# Patient Record
Sex: Male | Born: 1950 | Race: White | Hispanic: No | Marital: Married | Smoking: Former smoker
Health system: Southern US, Community
[De-identification: ages and names within clinical notes are randomized; demographics above are authoritative.]

## PROBLEM LIST (undated history)

## (undated) DIAGNOSIS — Z8619 Personal history of other infectious and parasitic diseases: Secondary | ICD-10-CM

## (undated) DIAGNOSIS — G4700 Insomnia, unspecified: Secondary | ICD-10-CM

## (undated) DIAGNOSIS — D3131 Benign neoplasm of right choroid: Secondary | ICD-10-CM

## (undated) DIAGNOSIS — N400 Enlarged prostate without lower urinary tract symptoms: Secondary | ICD-10-CM

## (undated) DIAGNOSIS — K648 Other hemorrhoids: Secondary | ICD-10-CM

## (undated) DIAGNOSIS — Z86010 Personal history of colonic polyps: Secondary | ICD-10-CM

## (undated) DIAGNOSIS — D239 Other benign neoplasm of skin, unspecified: Secondary | ICD-10-CM

## (undated) DIAGNOSIS — K219 Gastro-esophageal reflux disease without esophagitis: Secondary | ICD-10-CM

## (undated) DIAGNOSIS — R413 Other amnesia: Secondary | ICD-10-CM

## (undated) DIAGNOSIS — R351 Nocturia: Secondary | ICD-10-CM

## (undated) DIAGNOSIS — Z9089 Acquired absence of other organs: Secondary | ICD-10-CM

## (undated) DIAGNOSIS — G8929 Other chronic pain: Secondary | ICD-10-CM

## (undated) DIAGNOSIS — Z8639 Personal history of other endocrine, nutritional and metabolic disease: Secondary | ICD-10-CM

## (undated) DIAGNOSIS — G4730 Sleep apnea, unspecified: Secondary | ICD-10-CM

## (undated) DIAGNOSIS — E890 Postprocedural hypothyroidism: Secondary | ICD-10-CM

## (undated) DIAGNOSIS — G8191 Hemiplegia, unspecified affecting right dominant side: Secondary | ICD-10-CM

## (undated) DIAGNOSIS — I10 Essential (primary) hypertension: Secondary | ICD-10-CM

## (undated) DIAGNOSIS — D332 Benign neoplasm of brain, unspecified: Secondary | ICD-10-CM

## (undated) DIAGNOSIS — K5730 Diverticulosis of large intestine without perforation or abscess without bleeding: Secondary | ICD-10-CM

## (undated) DIAGNOSIS — R519 Headache, unspecified: Secondary | ICD-10-CM

## (undated) DIAGNOSIS — T7840XA Allergy, unspecified, initial encounter: Secondary | ICD-10-CM

## (undated) DIAGNOSIS — J309 Allergic rhinitis, unspecified: Secondary | ICD-10-CM

## (undated) DIAGNOSIS — Z9989 Dependence on other enabling machines and devices: Secondary | ICD-10-CM

## (undated) DIAGNOSIS — K5900 Constipation, unspecified: Secondary | ICD-10-CM

## (undated) DIAGNOSIS — R202 Paresthesia of skin: Secondary | ICD-10-CM

## (undated) DIAGNOSIS — Z8719 Personal history of other diseases of the digestive system: Secondary | ICD-10-CM

## (undated) DIAGNOSIS — Q141 Congenital malformation of retina: Secondary | ICD-10-CM

## (undated) DIAGNOSIS — G4733 Obstructive sleep apnea (adult) (pediatric): Secondary | ICD-10-CM

## (undated) DIAGNOSIS — H269 Unspecified cataract: Secondary | ICD-10-CM

## (undated) DIAGNOSIS — R5383 Other fatigue: Secondary | ICD-10-CM

## (undated) DIAGNOSIS — E041 Nontoxic single thyroid nodule: Secondary | ICD-10-CM

## (undated) DIAGNOSIS — R29898 Other symptoms and signs involving the musculoskeletal system: Secondary | ICD-10-CM

## (undated) DIAGNOSIS — E039 Hypothyroidism, unspecified: Secondary | ICD-10-CM

## (undated) DIAGNOSIS — Z860101 Personal history of adenomatous and serrated colon polyps: Secondary | ICD-10-CM

## (undated) DIAGNOSIS — R001 Bradycardia, unspecified: Secondary | ICD-10-CM

## (undated) DIAGNOSIS — R339 Retention of urine, unspecified: Secondary | ICD-10-CM

## (undated) DIAGNOSIS — R079 Chest pain, unspecified: Secondary | ICD-10-CM

## (undated) HISTORY — DX: Gastro-esophageal reflux disease without esophagitis: K21.9

## (undated) HISTORY — DX: Headache, unspecified: R51.9

## (undated) HISTORY — DX: Benign neoplasm of right choroid: D31.31

## (undated) HISTORY — DX: Unspecified atrial fibrillation: I48.91

## (undated) HISTORY — DX: Chest pain, unspecified: R07.9

## (undated) HISTORY — DX: Congenital malformation of retina: Q14.1

## (undated) HISTORY — DX: Unspecified cataract: H26.9

## (undated) HISTORY — PX: POLYPECTOMY: SHX149

## (undated) HISTORY — DX: Insomnia, unspecified: G47.00

## (undated) HISTORY — DX: Hemiplegia, unspecified affecting right dominant side: G81.91

## (undated) HISTORY — DX: Other benign neoplasm of skin, unspecified: D23.9

## (undated) HISTORY — DX: Acquired absence of other organs: Z90.89

## (undated) HISTORY — DX: Bradycardia, unspecified: R00.1

## (undated) HISTORY — PX: WISDOM TOOTH EXTRACTION: SHX21

## (undated) HISTORY — DX: Hypothyroidism, unspecified: E03.9

## (undated) HISTORY — DX: Paresthesia of skin: R20.2

## (undated) HISTORY — DX: Allergy, unspecified, initial encounter: T78.40XA

## (undated) HISTORY — DX: Other amnesia: R41.3

## (undated) HISTORY — PX: HEMORRHOID BANDING: SHX5850

## (undated) HISTORY — DX: Other fatigue: R53.83

## (undated) HISTORY — DX: Other chronic pain: G89.29

## (undated) HISTORY — DX: Sleep apnea, unspecified: G47.30

## (undated) HISTORY — DX: Nocturia: R35.1

## (undated) HISTORY — DX: Allergic rhinitis, unspecified: J30.9

## (undated) HISTORY — PX: KNEE ARTHROSCOPY: SUR90

## (undated) HISTORY — PX: BRAIN SURGERY: SHX531

## (undated) HISTORY — DX: Postprocedural hypothyroidism: E89.0

## (undated) HISTORY — DX: Constipation, unspecified: K59.00

## (undated) HISTORY — PX: LASIK: SHX215

## (undated) HISTORY — PX: EYE SURGERY: SHX253

## (undated) HISTORY — DX: Other hemorrhoids: K64.8

## (undated) HISTORY — PX: HERNIA REPAIR: SHX51

## (undated) HISTORY — PX: COLONOSCOPY: SHX174

## (undated) HISTORY — PX: PILONIDAL CYST EXCISION: SHX744

## (undated) HISTORY — DX: Benign neoplasm of brain, unspecified: D33.2

## (undated) HISTORY — DX: Other symptoms and signs involving the musculoskeletal system: R29.898

## (undated) SURGICAL SUPPLY — 2 items
WATCHMAN FLX PRO PROCEDURE (KITS) ×1 IMPLANT
WATCHMAN FXD CRV SYS PROCEDURE (KITS) ×1 IMPLANT

---

## 2008-10-30 HISTORY — PX: UPPER GASTROINTESTINAL ENDOSCOPY: SHX188

## 2011-06-13 HISTORY — PX: THYROIDECTOMY: SHX17

## 2014-08-31 DIAGNOSIS — G4730 Sleep apnea, unspecified: Secondary | ICD-10-CM | POA: Diagnosis not present

## 2014-08-31 DIAGNOSIS — K219 Gastro-esophageal reflux disease without esophagitis: Secondary | ICD-10-CM | POA: Diagnosis not present

## 2014-08-31 DIAGNOSIS — N400 Enlarged prostate without lower urinary tract symptoms: Secondary | ICD-10-CM | POA: Diagnosis not present

## 2014-08-31 DIAGNOSIS — H359 Unspecified retinal disorder: Secondary | ICD-10-CM | POA: Diagnosis not present

## 2014-08-31 DIAGNOSIS — R351 Nocturia: Secondary | ICD-10-CM | POA: Diagnosis not present

## 2014-08-31 DIAGNOSIS — J309 Allergic rhinitis, unspecified: Secondary | ICD-10-CM | POA: Diagnosis not present

## 2014-10-12 DIAGNOSIS — G4700 Insomnia, unspecified: Secondary | ICD-10-CM | POA: Diagnosis not present

## 2014-10-12 DIAGNOSIS — G4710 Hypersomnia, unspecified: Secondary | ICD-10-CM | POA: Diagnosis not present

## 2014-10-22 DIAGNOSIS — H04123 Dry eye syndrome of bilateral lacrimal glands: Secondary | ICD-10-CM | POA: Diagnosis not present

## 2014-10-22 DIAGNOSIS — H2513 Age-related nuclear cataract, bilateral: Secondary | ICD-10-CM | POA: Diagnosis not present

## 2014-10-22 DIAGNOSIS — H524 Presbyopia: Secondary | ICD-10-CM | POA: Diagnosis not present

## 2014-11-03 DIAGNOSIS — R972 Elevated prostate specific antigen [PSA]: Secondary | ICD-10-CM | POA: Diagnosis not present

## 2014-11-03 DIAGNOSIS — R350 Frequency of micturition: Secondary | ICD-10-CM | POA: Diagnosis not present

## 2014-11-03 DIAGNOSIS — R351 Nocturia: Secondary | ICD-10-CM | POA: Diagnosis not present

## 2014-11-03 DIAGNOSIS — N401 Enlarged prostate with lower urinary tract symptoms: Secondary | ICD-10-CM | POA: Diagnosis not present

## 2014-11-10 DIAGNOSIS — G4700 Insomnia, unspecified: Secondary | ICD-10-CM | POA: Diagnosis not present

## 2014-11-10 DIAGNOSIS — G4733 Obstructive sleep apnea (adult) (pediatric): Secondary | ICD-10-CM | POA: Diagnosis not present

## 2014-11-10 DIAGNOSIS — G4730 Sleep apnea, unspecified: Secondary | ICD-10-CM | POA: Diagnosis not present

## 2014-11-17 DIAGNOSIS — R509 Fever, unspecified: Secondary | ICD-10-CM | POA: Diagnosis not present

## 2014-11-17 DIAGNOSIS — J329 Chronic sinusitis, unspecified: Secondary | ICD-10-CM | POA: Diagnosis not present

## 2014-11-26 DIAGNOSIS — Z9889 Other specified postprocedural states: Secondary | ICD-10-CM | POA: Diagnosis not present

## 2014-11-26 DIAGNOSIS — G4733 Obstructive sleep apnea (adult) (pediatric): Secondary | ICD-10-CM | POA: Diagnosis not present

## 2014-11-26 DIAGNOSIS — J324 Chronic pansinusitis: Secondary | ICD-10-CM | POA: Diagnosis not present

## 2014-11-30 ENCOUNTER — Other Ambulatory Visit: Payer: Self-pay | Admitting: Otolaryngology

## 2014-11-30 DIAGNOSIS — D34 Benign neoplasm of thyroid gland: Secondary | ICD-10-CM

## 2014-12-17 ENCOUNTER — Ambulatory Visit
Admission: RE | Admit: 2014-12-17 | Discharge: 2014-12-17 | Disposition: A | Discharge status: Home / Self Care | Payer: Medicare Other | Source: Ambulatory Visit | Attending: Otolaryngology | Admitting: Otolaryngology

## 2014-12-17 DIAGNOSIS — E042 Nontoxic multinodular goiter: Secondary | ICD-10-CM | POA: Diagnosis not present

## 2014-12-17 DIAGNOSIS — E890 Postprocedural hypothyroidism: Secondary | ICD-10-CM | POA: Diagnosis not present

## 2014-12-17 DIAGNOSIS — Z23 Encounter for immunization: Secondary | ICD-10-CM | POA: Diagnosis not present

## 2014-12-17 DIAGNOSIS — D34 Benign neoplasm of thyroid gland: Secondary | ICD-10-CM

## 2014-12-17 IMAGING — US US SOFT TISSUE HEAD/NECK
1 series · 13 of 25 positions shown · non-contrast
Comparison: None.

CLINICAL DATA: Follow-up exam.  Left thyroidectomy.

EXAM:
THYROID ULTRASOUND
TECHNIQUE: Ultrasound examination of the thyroid gland and adjacent soft
tissues was performed.

[Series 1: us soft tissue head/neck · 0.06mm/px · 13 of 48 slices shown]
[im 1/48]
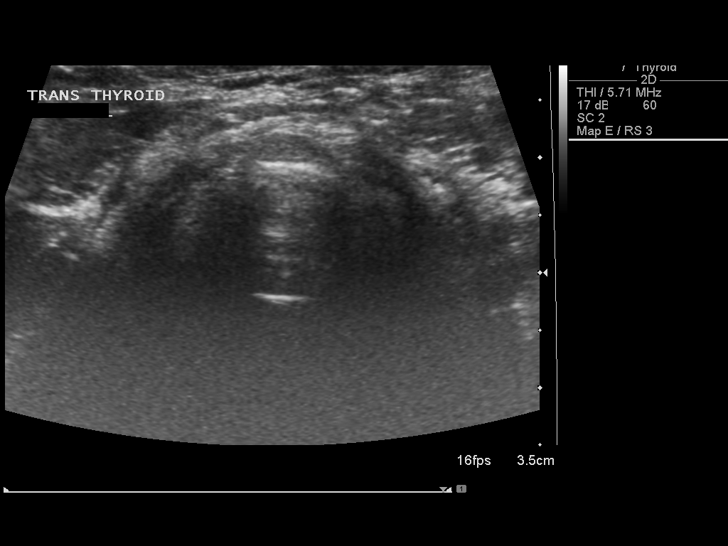
[im 4/48]
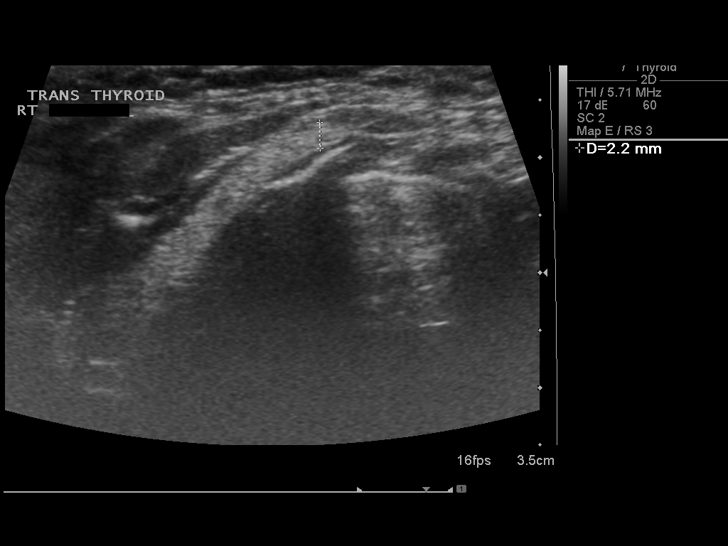
[im 8/48]
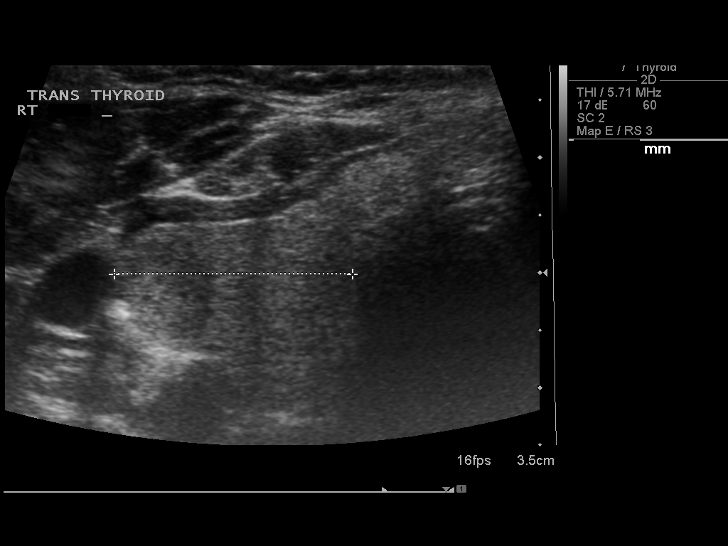
[im 12/48]
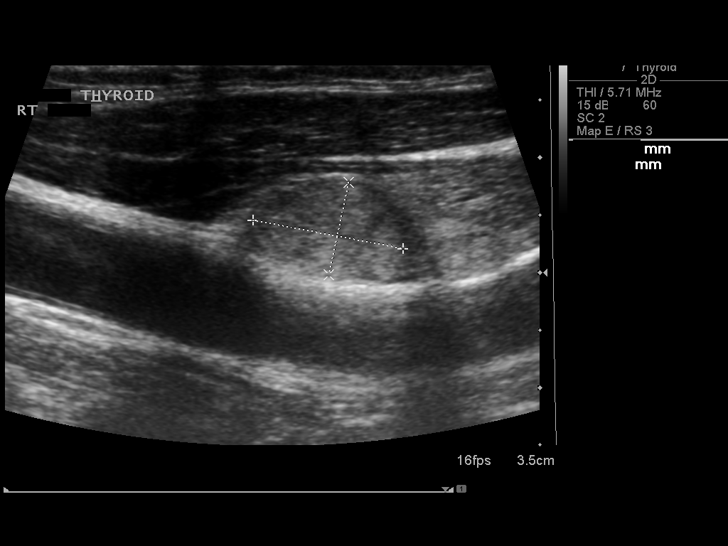
[im 16/48]
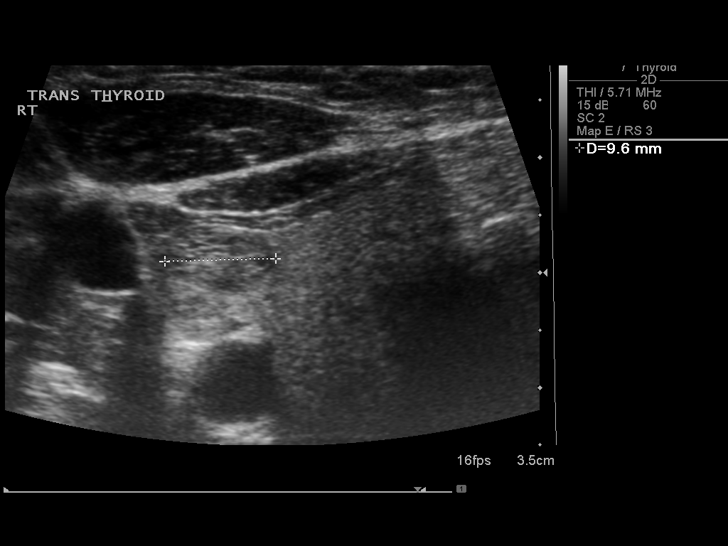
[im 20/48]
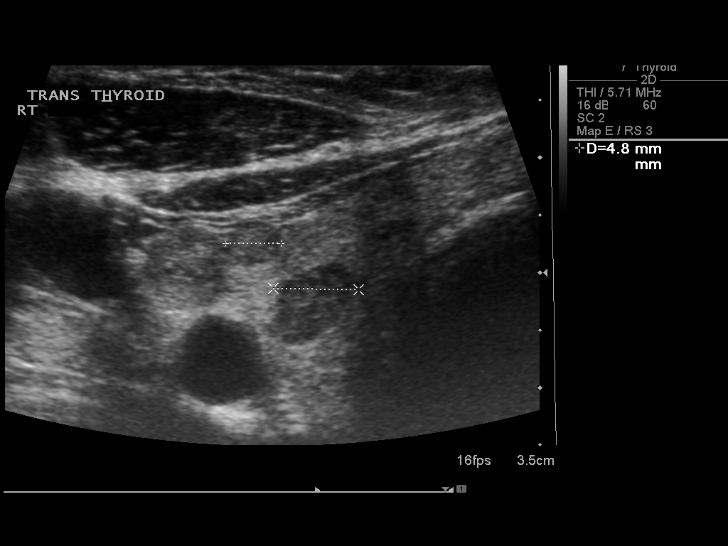
[im 24/48]
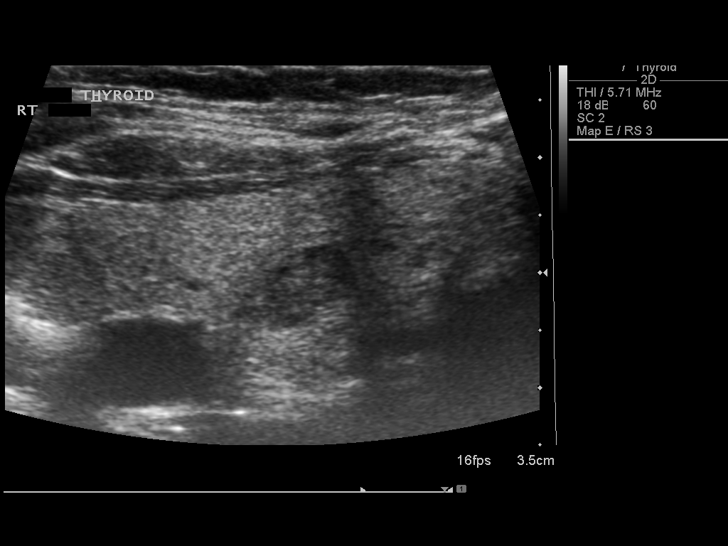
[im 28/48]
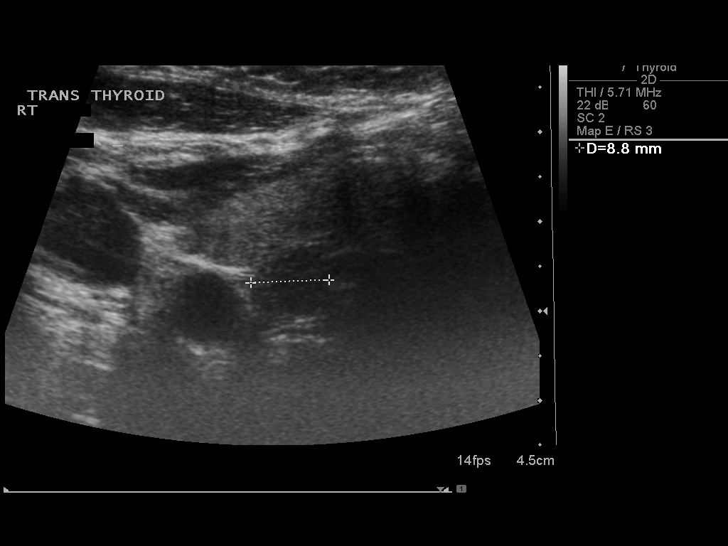
[im 32/48]
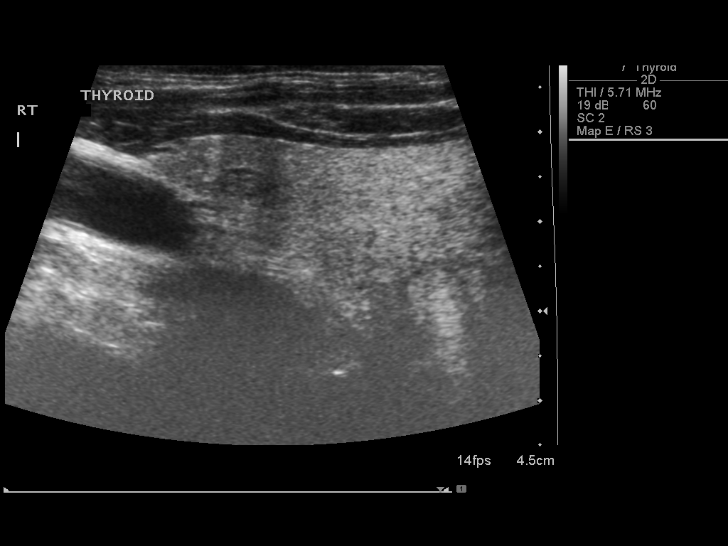
[im 36/48]
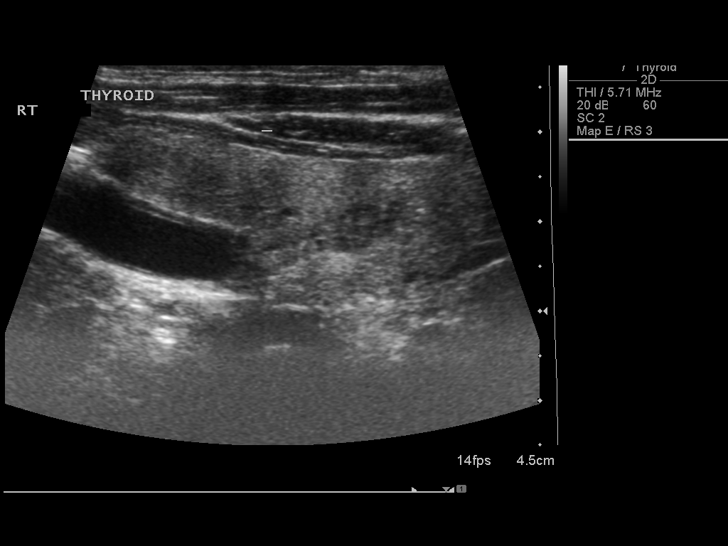
[im 40/48]
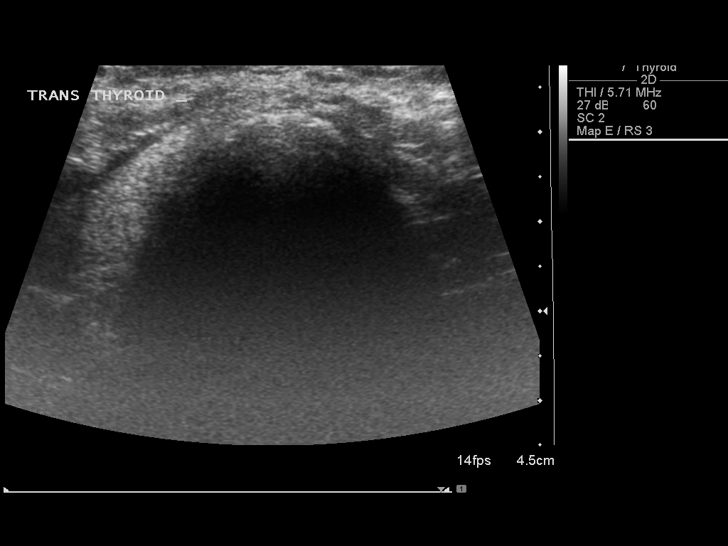
[im 44/48]
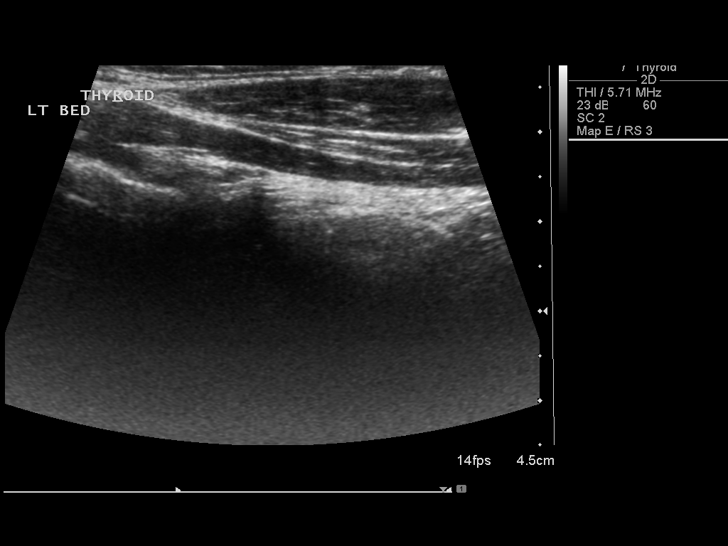
[im 48/48]
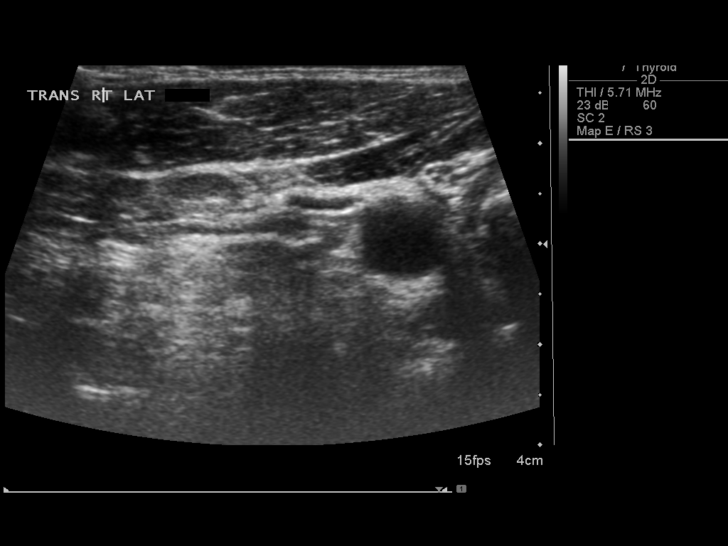

[13 of 25 positions shown; findings below may reference images not displayed]

FINDINGS: Right thyroid lobe

Measurements: 4.8 x 1.8 x 2.1 cm. 1.3 cm solid nodule upper portion
right gland.

1 cm solid nodule upper mid gland. Less than 5 mm solid nodule mid
gland. 1.1 cm solid nodule lower right gland. 1 cm adjacent solid
nodule lower right gland.

Left thyroid lobe

Left thyroidectomy.

Isthmus

Thickness: 0.2 cm.  No nodules visualized.

Lymphadenopathy

None visualized.
IMPRESSION: 1. Multiple solid thyroid nodules right gland, the largest measures
1.3 cm and is in the upper gland. Correlate with any prior thyroid
scans. There has been any significant growth biopsy can be
performed. If there has been no prior scans findings do not meet
current SRU consensus criteria for biopsy. Follow-up by clinical
exam is recommended. If patient has known risk factors for thyroid
carcinoma, consider follow-up ultrasound in 12 months. If patient is
clinically hyperthyroid, consider nuclear medicine thyroid uptake
and scan.Reference: Management of Thyroid Nodules Detected at US:
Society of Radiologists in Ultrasound Consensus Conference

2.  Left thyroidectomy.

## 2014-12-31 DIAGNOSIS — J3089 Other allergic rhinitis: Secondary | ICD-10-CM | POA: Diagnosis not present

## 2014-12-31 DIAGNOSIS — H1045 Other chronic allergic conjunctivitis: Secondary | ICD-10-CM | POA: Diagnosis not present

## 2014-12-31 DIAGNOSIS — J301 Allergic rhinitis due to pollen: Secondary | ICD-10-CM | POA: Diagnosis not present

## 2015-01-01 DIAGNOSIS — J301 Allergic rhinitis due to pollen: Secondary | ICD-10-CM | POA: Diagnosis not present

## 2015-01-01 DIAGNOSIS — J3089 Other allergic rhinitis: Secondary | ICD-10-CM | POA: Diagnosis not present

## 2015-01-11 DIAGNOSIS — J3089 Other allergic rhinitis: Secondary | ICD-10-CM | POA: Diagnosis not present

## 2015-01-11 DIAGNOSIS — J301 Allergic rhinitis due to pollen: Secondary | ICD-10-CM | POA: Diagnosis not present

## 2015-01-15 DIAGNOSIS — J3089 Other allergic rhinitis: Secondary | ICD-10-CM | POA: Diagnosis not present

## 2015-01-15 DIAGNOSIS — J301 Allergic rhinitis due to pollen: Secondary | ICD-10-CM | POA: Diagnosis not present

## 2015-01-18 DIAGNOSIS — G4700 Insomnia, unspecified: Secondary | ICD-10-CM | POA: Diagnosis not present

## 2015-01-18 DIAGNOSIS — J301 Allergic rhinitis due to pollen: Secondary | ICD-10-CM | POA: Diagnosis not present

## 2015-01-18 DIAGNOSIS — G4733 Obstructive sleep apnea (adult) (pediatric): Secondary | ICD-10-CM | POA: Diagnosis not present

## 2015-01-21 DIAGNOSIS — J301 Allergic rhinitis due to pollen: Secondary | ICD-10-CM | POA: Diagnosis not present

## 2015-01-25 DIAGNOSIS — J3089 Other allergic rhinitis: Secondary | ICD-10-CM | POA: Diagnosis not present

## 2015-01-25 DIAGNOSIS — J301 Allergic rhinitis due to pollen: Secondary | ICD-10-CM | POA: Diagnosis not present

## 2015-01-27 DIAGNOSIS — J301 Allergic rhinitis due to pollen: Secondary | ICD-10-CM | POA: Diagnosis not present

## 2015-01-27 DIAGNOSIS — J3089 Other allergic rhinitis: Secondary | ICD-10-CM | POA: Diagnosis not present

## 2015-01-28 DIAGNOSIS — R972 Elevated prostate specific antigen [PSA]: Secondary | ICD-10-CM | POA: Diagnosis not present

## 2015-01-29 DIAGNOSIS — J3089 Other allergic rhinitis: Secondary | ICD-10-CM | POA: Diagnosis not present

## 2015-01-29 DIAGNOSIS — J301 Allergic rhinitis due to pollen: Secondary | ICD-10-CM | POA: Diagnosis not present

## 2015-02-01 DIAGNOSIS — J301 Allergic rhinitis due to pollen: Secondary | ICD-10-CM | POA: Diagnosis not present

## 2015-02-01 DIAGNOSIS — J3089 Other allergic rhinitis: Secondary | ICD-10-CM | POA: Diagnosis not present

## 2015-02-03 DIAGNOSIS — J301 Allergic rhinitis due to pollen: Secondary | ICD-10-CM | POA: Diagnosis not present

## 2015-02-03 DIAGNOSIS — J3089 Other allergic rhinitis: Secondary | ICD-10-CM | POA: Diagnosis not present

## 2015-02-05 DIAGNOSIS — J301 Allergic rhinitis due to pollen: Secondary | ICD-10-CM | POA: Diagnosis not present

## 2015-02-05 DIAGNOSIS — J3089 Other allergic rhinitis: Secondary | ICD-10-CM | POA: Diagnosis not present

## 2015-02-08 DIAGNOSIS — R972 Elevated prostate specific antigen [PSA]: Secondary | ICD-10-CM | POA: Diagnosis not present

## 2015-02-08 DIAGNOSIS — N401 Enlarged prostate with lower urinary tract symptoms: Secondary | ICD-10-CM | POA: Diagnosis not present

## 2015-02-08 DIAGNOSIS — R351 Nocturia: Secondary | ICD-10-CM | POA: Diagnosis not present

## 2015-02-08 DIAGNOSIS — J3089 Other allergic rhinitis: Secondary | ICD-10-CM | POA: Diagnosis not present

## 2015-02-08 DIAGNOSIS — J301 Allergic rhinitis due to pollen: Secondary | ICD-10-CM | POA: Diagnosis not present

## 2015-02-12 DIAGNOSIS — J3089 Other allergic rhinitis: Secondary | ICD-10-CM | POA: Diagnosis not present

## 2015-02-12 DIAGNOSIS — J301 Allergic rhinitis due to pollen: Secondary | ICD-10-CM | POA: Diagnosis not present

## 2015-02-16 DIAGNOSIS — J301 Allergic rhinitis due to pollen: Secondary | ICD-10-CM | POA: Diagnosis not present

## 2015-02-16 DIAGNOSIS — J3089 Other allergic rhinitis: Secondary | ICD-10-CM | POA: Diagnosis not present

## 2015-02-18 DIAGNOSIS — G4700 Insomnia, unspecified: Secondary | ICD-10-CM | POA: Diagnosis not present

## 2015-02-19 DIAGNOSIS — J3089 Other allergic rhinitis: Secondary | ICD-10-CM | POA: Diagnosis not present

## 2015-02-19 DIAGNOSIS — J301 Allergic rhinitis due to pollen: Secondary | ICD-10-CM | POA: Diagnosis not present

## 2015-02-23 DIAGNOSIS — J301 Allergic rhinitis due to pollen: Secondary | ICD-10-CM | POA: Diagnosis not present

## 2015-02-23 DIAGNOSIS — J3089 Other allergic rhinitis: Secondary | ICD-10-CM | POA: Diagnosis not present

## 2015-02-25 DIAGNOSIS — J3089 Other allergic rhinitis: Secondary | ICD-10-CM | POA: Diagnosis not present

## 2015-02-25 DIAGNOSIS — J301 Allergic rhinitis due to pollen: Secondary | ICD-10-CM | POA: Diagnosis not present

## 2015-03-01 DIAGNOSIS — J3089 Other allergic rhinitis: Secondary | ICD-10-CM | POA: Diagnosis not present

## 2015-03-01 DIAGNOSIS — J301 Allergic rhinitis due to pollen: Secondary | ICD-10-CM | POA: Diagnosis not present

## 2015-03-03 DIAGNOSIS — J301 Allergic rhinitis due to pollen: Secondary | ICD-10-CM | POA: Diagnosis not present

## 2015-03-03 DIAGNOSIS — J3089 Other allergic rhinitis: Secondary | ICD-10-CM | POA: Diagnosis not present

## 2015-03-05 DIAGNOSIS — J3089 Other allergic rhinitis: Secondary | ICD-10-CM | POA: Diagnosis not present

## 2015-03-05 DIAGNOSIS — J301 Allergic rhinitis due to pollen: Secondary | ICD-10-CM | POA: Diagnosis not present

## 2015-03-08 DIAGNOSIS — J3089 Other allergic rhinitis: Secondary | ICD-10-CM | POA: Diagnosis not present

## 2015-03-08 DIAGNOSIS — J301 Allergic rhinitis due to pollen: Secondary | ICD-10-CM | POA: Diagnosis not present

## 2015-03-10 DIAGNOSIS — J3089 Other allergic rhinitis: Secondary | ICD-10-CM | POA: Diagnosis not present

## 2015-03-10 DIAGNOSIS — J301 Allergic rhinitis due to pollen: Secondary | ICD-10-CM | POA: Diagnosis not present

## 2015-03-11 DIAGNOSIS — Z23 Encounter for immunization: Secondary | ICD-10-CM | POA: Diagnosis not present

## 2015-03-16 DIAGNOSIS — J301 Allergic rhinitis due to pollen: Secondary | ICD-10-CM | POA: Diagnosis not present

## 2015-03-16 DIAGNOSIS — J3089 Other allergic rhinitis: Secondary | ICD-10-CM | POA: Diagnosis not present

## 2015-03-19 DIAGNOSIS — J3089 Other allergic rhinitis: Secondary | ICD-10-CM | POA: Diagnosis not present

## 2015-03-19 DIAGNOSIS — J301 Allergic rhinitis due to pollen: Secondary | ICD-10-CM | POA: Diagnosis not present

## 2015-03-24 DIAGNOSIS — J301 Allergic rhinitis due to pollen: Secondary | ICD-10-CM | POA: Diagnosis not present

## 2015-03-24 DIAGNOSIS — J3089 Other allergic rhinitis: Secondary | ICD-10-CM | POA: Diagnosis not present

## 2015-03-26 DIAGNOSIS — J301 Allergic rhinitis due to pollen: Secondary | ICD-10-CM | POA: Diagnosis not present

## 2015-03-26 DIAGNOSIS — J3089 Other allergic rhinitis: Secondary | ICD-10-CM | POA: Diagnosis not present

## 2015-04-05 DIAGNOSIS — J3089 Other allergic rhinitis: Secondary | ICD-10-CM | POA: Diagnosis not present

## 2015-04-05 DIAGNOSIS — J301 Allergic rhinitis due to pollen: Secondary | ICD-10-CM | POA: Diagnosis not present

## 2015-04-12 DIAGNOSIS — J3089 Other allergic rhinitis: Secondary | ICD-10-CM | POA: Diagnosis not present

## 2015-04-12 DIAGNOSIS — J301 Allergic rhinitis due to pollen: Secondary | ICD-10-CM | POA: Diagnosis not present

## 2015-04-13 DIAGNOSIS — G4700 Insomnia, unspecified: Secondary | ICD-10-CM | POA: Diagnosis not present

## 2015-04-13 DIAGNOSIS — G4733 Obstructive sleep apnea (adult) (pediatric): Secondary | ICD-10-CM | POA: Diagnosis not present

## 2015-04-15 DIAGNOSIS — J301 Allergic rhinitis due to pollen: Secondary | ICD-10-CM | POA: Diagnosis not present

## 2015-04-15 DIAGNOSIS — J3089 Other allergic rhinitis: Secondary | ICD-10-CM | POA: Diagnosis not present

## 2015-04-19 DIAGNOSIS — J301 Allergic rhinitis due to pollen: Secondary | ICD-10-CM | POA: Diagnosis not present

## 2015-04-19 DIAGNOSIS — J3089 Other allergic rhinitis: Secondary | ICD-10-CM | POA: Diagnosis not present

## 2015-04-22 DIAGNOSIS — J301 Allergic rhinitis due to pollen: Secondary | ICD-10-CM | POA: Diagnosis not present

## 2015-04-22 DIAGNOSIS — J3089 Other allergic rhinitis: Secondary | ICD-10-CM | POA: Diagnosis not present

## 2015-04-27 DIAGNOSIS — J069 Acute upper respiratory infection, unspecified: Secondary | ICD-10-CM | POA: Diagnosis not present

## 2015-04-27 DIAGNOSIS — R05 Cough: Secondary | ICD-10-CM | POA: Diagnosis not present

## 2015-05-03 DIAGNOSIS — D2271 Melanocytic nevi of right lower limb, including hip: Secondary | ICD-10-CM | POA: Diagnosis not present

## 2015-05-03 DIAGNOSIS — D2272 Melanocytic nevi of left lower limb, including hip: Secondary | ICD-10-CM | POA: Diagnosis not present

## 2015-05-03 DIAGNOSIS — D2261 Melanocytic nevi of right upper limb, including shoulder: Secondary | ICD-10-CM | POA: Diagnosis not present

## 2015-05-03 DIAGNOSIS — D1801 Hemangioma of skin and subcutaneous tissue: Secondary | ICD-10-CM | POA: Diagnosis not present

## 2015-05-03 DIAGNOSIS — L821 Other seborrheic keratosis: Secondary | ICD-10-CM | POA: Diagnosis not present

## 2015-05-03 DIAGNOSIS — D225 Melanocytic nevi of trunk: Secondary | ICD-10-CM | POA: Diagnosis not present

## 2015-05-03 DIAGNOSIS — D2262 Melanocytic nevi of left upper limb, including shoulder: Secondary | ICD-10-CM | POA: Diagnosis not present

## 2015-05-11 DIAGNOSIS — R972 Elevated prostate specific antigen [PSA]: Secondary | ICD-10-CM | POA: Diagnosis not present

## 2015-05-11 DIAGNOSIS — N401 Enlarged prostate with lower urinary tract symptoms: Secondary | ICD-10-CM | POA: Diagnosis not present

## 2015-05-11 DIAGNOSIS — R350 Frequency of micturition: Secondary | ICD-10-CM | POA: Diagnosis not present

## 2015-05-11 DIAGNOSIS — N5201 Erectile dysfunction due to arterial insufficiency: Secondary | ICD-10-CM | POA: Diagnosis not present

## 2015-05-18 DIAGNOSIS — J301 Allergic rhinitis due to pollen: Secondary | ICD-10-CM | POA: Diagnosis not present

## 2015-05-18 DIAGNOSIS — J3089 Other allergic rhinitis: Secondary | ICD-10-CM | POA: Diagnosis not present

## 2015-05-21 DIAGNOSIS — J301 Allergic rhinitis due to pollen: Secondary | ICD-10-CM | POA: Diagnosis not present

## 2015-05-21 DIAGNOSIS — J3089 Other allergic rhinitis: Secondary | ICD-10-CM | POA: Diagnosis not present

## 2015-05-25 DIAGNOSIS — J3089 Other allergic rhinitis: Secondary | ICD-10-CM | POA: Diagnosis not present

## 2015-05-25 DIAGNOSIS — J301 Allergic rhinitis due to pollen: Secondary | ICD-10-CM | POA: Diagnosis not present

## 2015-05-28 DIAGNOSIS — J301 Allergic rhinitis due to pollen: Secondary | ICD-10-CM | POA: Diagnosis not present

## 2015-05-31 DIAGNOSIS — J301 Allergic rhinitis due to pollen: Secondary | ICD-10-CM | POA: Diagnosis not present

## 2015-05-31 DIAGNOSIS — J3089 Other allergic rhinitis: Secondary | ICD-10-CM | POA: Diagnosis not present

## 2015-06-03 DIAGNOSIS — J3089 Other allergic rhinitis: Secondary | ICD-10-CM | POA: Diagnosis not present

## 2015-06-03 DIAGNOSIS — J301 Allergic rhinitis due to pollen: Secondary | ICD-10-CM | POA: Diagnosis not present

## 2015-06-08 DIAGNOSIS — J301 Allergic rhinitis due to pollen: Secondary | ICD-10-CM | POA: Diagnosis not present

## 2015-06-08 DIAGNOSIS — J3089 Other allergic rhinitis: Secondary | ICD-10-CM | POA: Diagnosis not present

## 2015-06-11 DIAGNOSIS — J301 Allergic rhinitis due to pollen: Secondary | ICD-10-CM | POA: Diagnosis not present

## 2015-06-11 DIAGNOSIS — J3089 Other allergic rhinitis: Secondary | ICD-10-CM | POA: Diagnosis not present

## 2015-06-15 DIAGNOSIS — J3089 Other allergic rhinitis: Secondary | ICD-10-CM | POA: Diagnosis not present

## 2015-06-15 DIAGNOSIS — J301 Allergic rhinitis due to pollen: Secondary | ICD-10-CM | POA: Diagnosis not present

## 2015-06-17 DIAGNOSIS — G4700 Insomnia, unspecified: Secondary | ICD-10-CM | POA: Diagnosis not present

## 2015-06-17 DIAGNOSIS — G4733 Obstructive sleep apnea (adult) (pediatric): Secondary | ICD-10-CM | POA: Diagnosis not present

## 2015-06-18 DIAGNOSIS — J301 Allergic rhinitis due to pollen: Secondary | ICD-10-CM | POA: Diagnosis not present

## 2015-06-18 DIAGNOSIS — J3089 Other allergic rhinitis: Secondary | ICD-10-CM | POA: Diagnosis not present

## 2015-06-22 DIAGNOSIS — J3089 Other allergic rhinitis: Secondary | ICD-10-CM | POA: Diagnosis not present

## 2015-06-22 DIAGNOSIS — J301 Allergic rhinitis due to pollen: Secondary | ICD-10-CM | POA: Diagnosis not present

## 2015-06-30 DIAGNOSIS — J3089 Other allergic rhinitis: Secondary | ICD-10-CM | POA: Diagnosis not present

## 2015-06-30 DIAGNOSIS — J301 Allergic rhinitis due to pollen: Secondary | ICD-10-CM | POA: Diagnosis not present

## 2015-07-07 DIAGNOSIS — J3089 Other allergic rhinitis: Secondary | ICD-10-CM | POA: Diagnosis not present

## 2015-07-07 DIAGNOSIS — J301 Allergic rhinitis due to pollen: Secondary | ICD-10-CM | POA: Diagnosis not present

## 2015-07-13 DIAGNOSIS — J301 Allergic rhinitis due to pollen: Secondary | ICD-10-CM | POA: Diagnosis not present

## 2015-07-13 DIAGNOSIS — J3089 Other allergic rhinitis: Secondary | ICD-10-CM | POA: Diagnosis not present

## 2015-07-20 DIAGNOSIS — J3089 Other allergic rhinitis: Secondary | ICD-10-CM | POA: Diagnosis not present

## 2015-07-20 DIAGNOSIS — J301 Allergic rhinitis due to pollen: Secondary | ICD-10-CM | POA: Diagnosis not present

## 2015-07-26 DIAGNOSIS — J3089 Other allergic rhinitis: Secondary | ICD-10-CM | POA: Diagnosis not present

## 2015-07-26 DIAGNOSIS — J301 Allergic rhinitis due to pollen: Secondary | ICD-10-CM | POA: Diagnosis not present

## 2015-07-27 IMAGING — US US THYROID
1 series · 12 of 25 positions shown · non-contrast
Comparison: [DATE]

CLINICAL DATA: Follow-up right thyroid lobe nodules. Prior left
hemithyroidectomy.

EXAM:
THYROID ULTRASOUND
TECHNIQUE: Ultrasound examination of the thyroid gland and adjacent soft
tissues was performed.

[Series 1: us thyroid · 0.08mm/px · 12 of 47 slices shown]
[im 2/47]
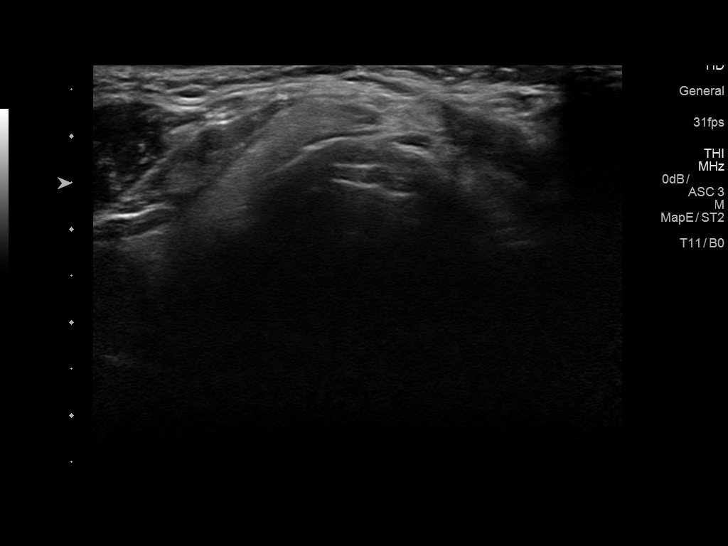
[im 6/47]
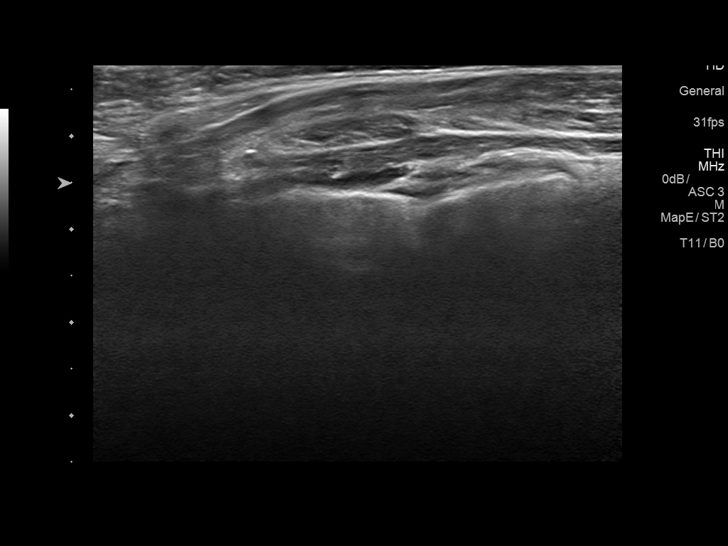
[im 10/47]
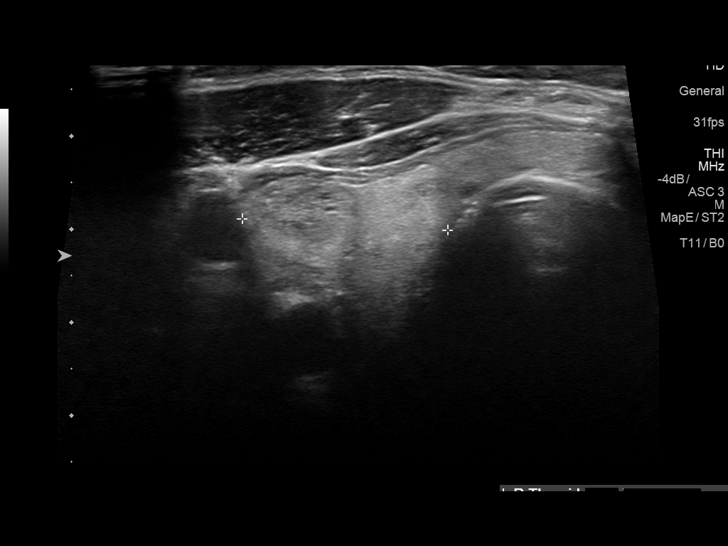
[im 14/47]
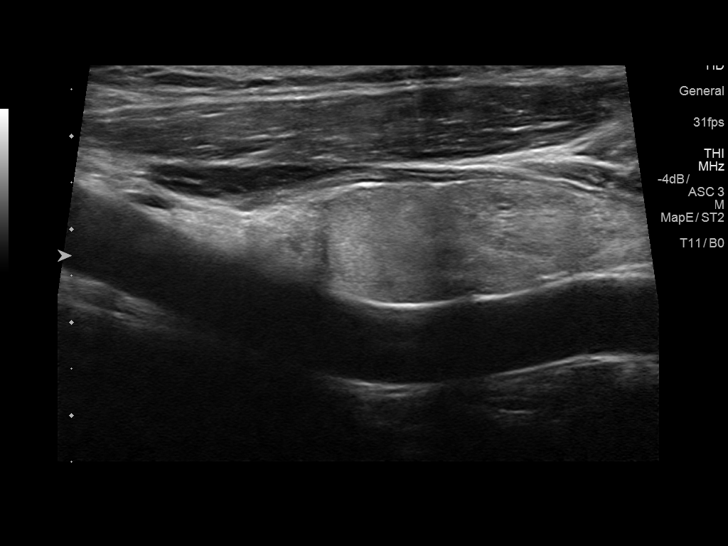
[im 18/47]
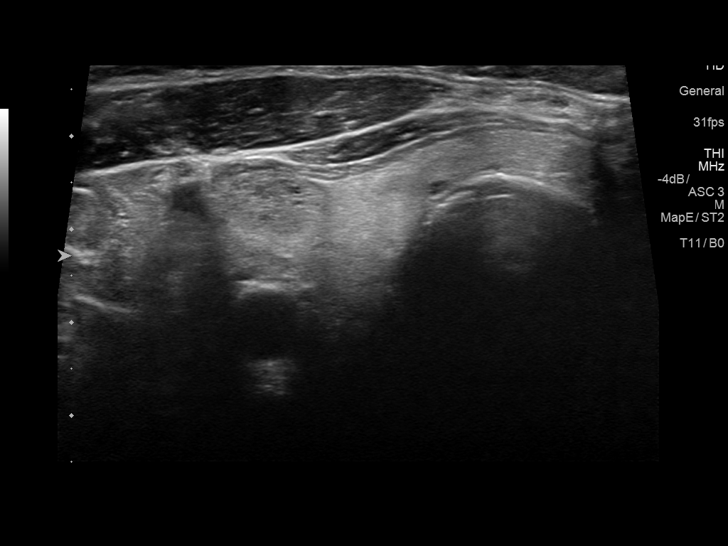
[im 22/47]
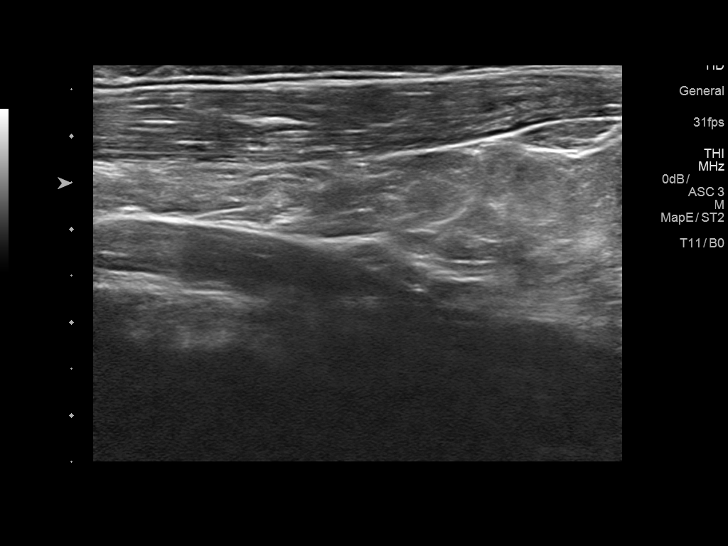
[im 25/47]
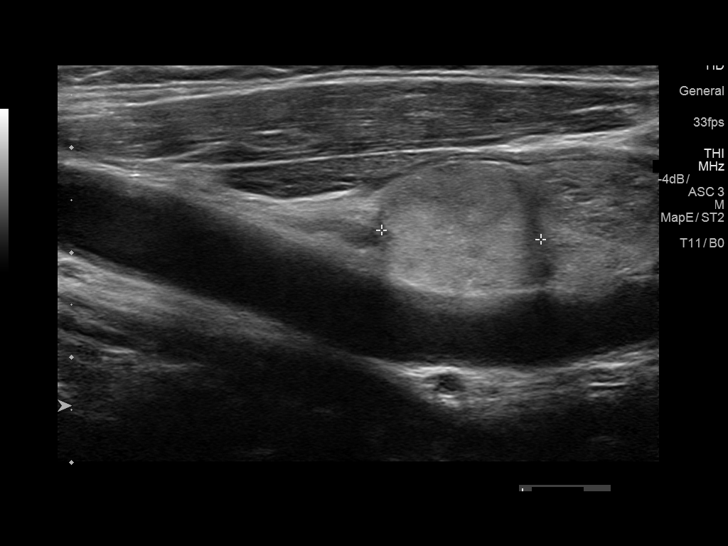
[im 29/47]
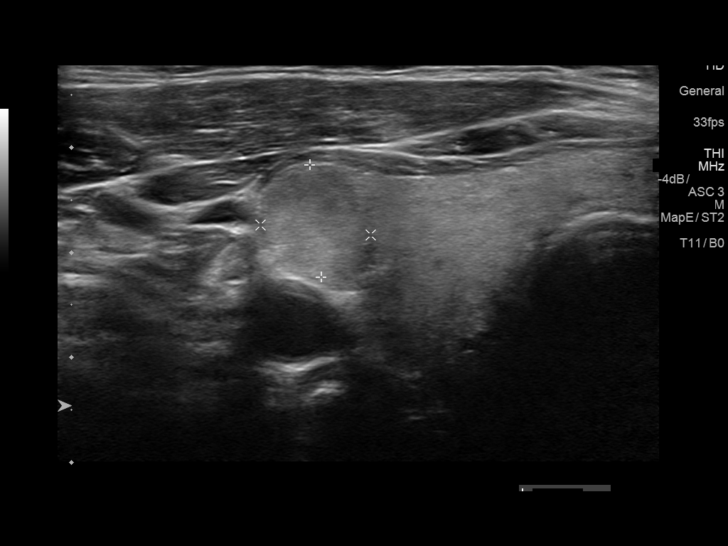
[im 33/47]
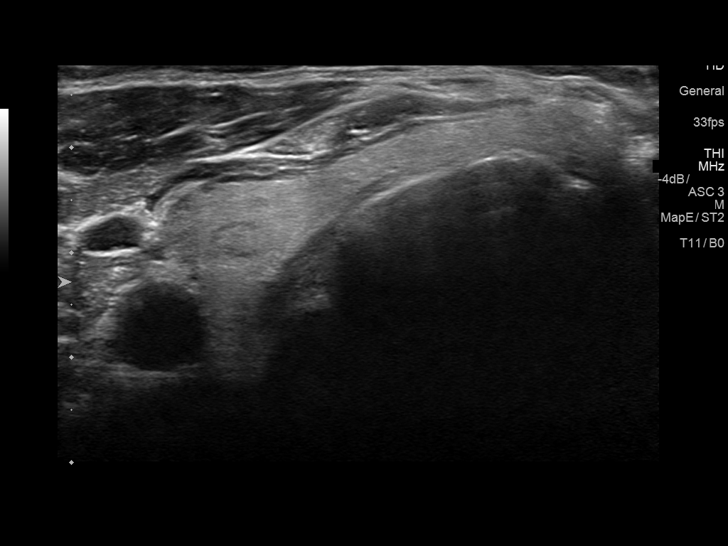
[im 37/47]
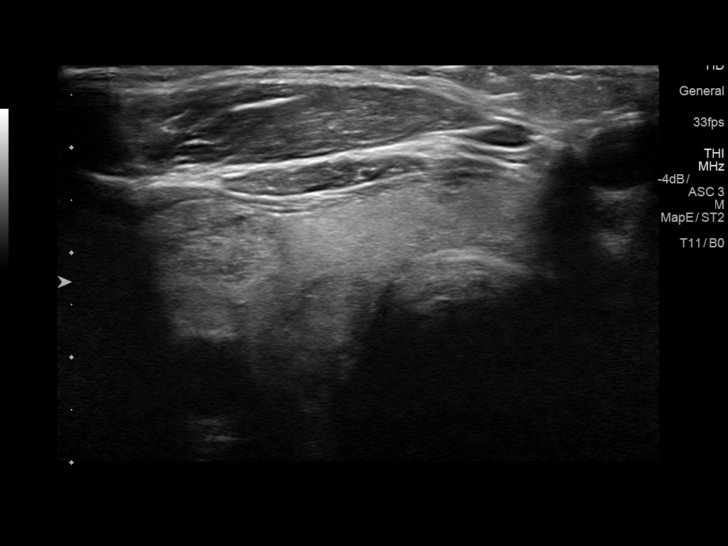
[im 41/47]
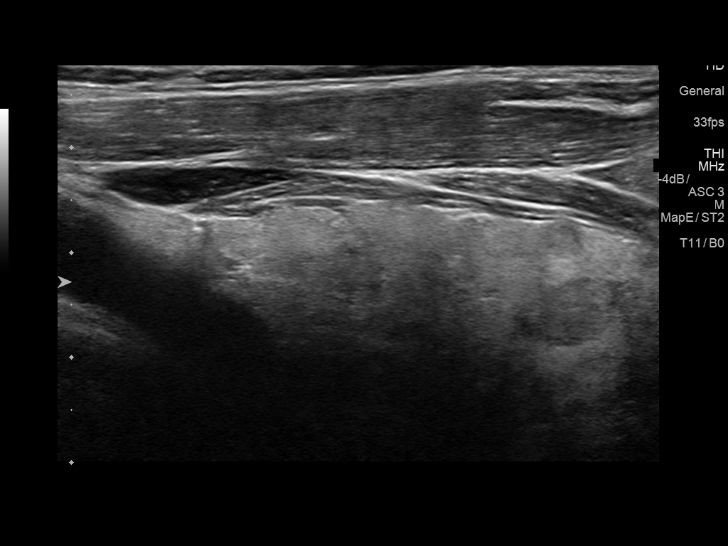
[im 45/47]
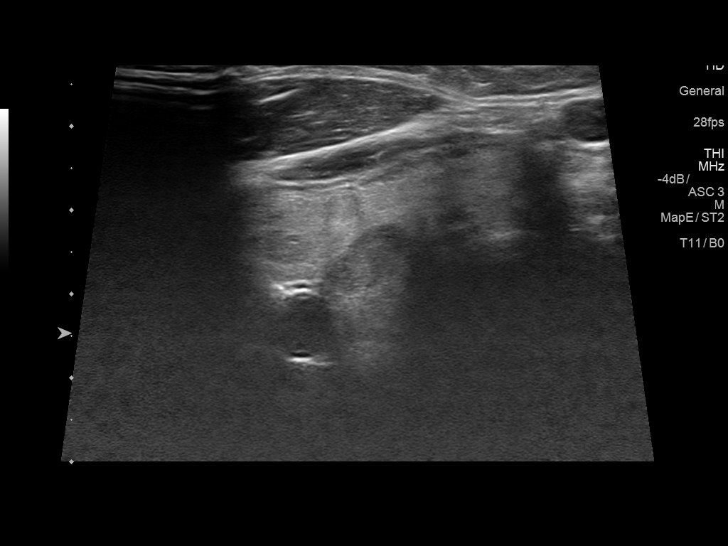

[12 of 25 positions shown; findings below may reference images not displayed]

FINDINGS: Parenchymal Echotexture: Mildly heterogenous

Isthmus: 0.4 cm, previously 0.3 cm

Right lobe: 5.6 x 1.5 x 2.2 cm, previously 5.8 x 2.0 x 2.9 cm

_________________________________________________________

Estimated total number of nodules >/= 1 cm: 2

Number of spongiform nodules >/=  2 cm not described below (TR1): 0

Number of mixed cystic and solid nodules >/= 1.5 cm not described
below (TR2): 0

_________________________________________________________

Nodule # 1:

Prior biopsy: No

Location: Right; Superior

Maximum size: 1.5 cm; Other 2 dimensions: 1.1 x 1.1 cm, previously,
1.6 x 1.1 x 1.1 cm

Composition: solid/almost completely solid (2)

Echogenicity: isoechoic (1)

Shape: not taller-than-wide (0)

Margins: ill-defined (0)

Echogenic foci: none (0)

ACR TI-RADS total points: 3.

ACR TI-RADS risk category:  TR3 (3 points).

Significant change in size (>/= 20% in two dimensions and minimal
increase of 2 mm): No

Change in features: No

Change in ACR TI-RADS risk category: No

ACR TI-RADS recommendations:

*Given size (>/= 1.5 - 2.4 cm) and appearance, a follow-up
ultrasound in 1 year should be considered based on TI-RADS criteria.

_________________________________________________________

Nodule # 2:

Prior biopsy: No

Location: Right; Mid

Maximum size: 1.3 cm; Other 2 dimensions: 1.2 x 0.7 cm, previously,
1.3 x 0.7 x 1.2 cm

Composition: solid/almost completely solid (2)

Echogenicity: hypoechoic (2)

Shape: not taller-than-wide (0)

Margins: ill-defined (0)

Echogenic foci: none (0)

ACR TI-RADS total points: 4.

ACR TI-RADS risk category:  TR4 (4-6 points).

Significant change in size (>/= 20% in two dimensions and minimal
increase of 2 mm): No

Change in features: No

Change in ACR TI-RADS risk category: No

ACR TI-RADS recommendations:

*Given size (>/= 1 - 1.4 cm) and appearance, a follow-up ultrasound
in 1 year should be considered based on TI-RADS criteria.

_________________________________________________________

Nodule # 3:

Prior biopsy: No

Location: Right; Inferior

Maximum size: 0.9 cm; Other 2 dimensions: 0.7 x 0.8 cm, previously,
1.1 x 0.7 x 0.9 cm

Composition: solid/almost completely solid (2)

Echogenicity: hypoechoic (2)

Shape: not taller-than-wide (0)

Margins: smooth (0)

Echogenic foci: none (0)

ACR TI-RADS total points: 4.

ACR TI-RADS risk category:  TR4 (4-6 points).

Significant change in size (>/= 20% in two dimensions and minimal
increase of 2 mm): No

Change in features: No

Change in ACR TI-RADS risk category: No

ACR TI-RADS recommendations:

Given size (<0.9 cm) and appearance, this nodule does NOT meet
TI-RADS criteria for biopsy or dedicated follow-up.

_________________________________________________________

Left thyroid tissue has been removed. No residual thyroid tissue in
the left thyroid surgical bed.
IMPRESSION: Stable right thyroid nodules. Two dominant nodules meet criteria for
1 year follow-up.

The above is in keeping with the ACR TI-RADS recommendations - [HOSPITAL] [01];[DATE].

## 2015-07-29 DIAGNOSIS — J3089 Other allergic rhinitis: Secondary | ICD-10-CM | POA: Diagnosis not present

## 2015-07-29 DIAGNOSIS — J301 Allergic rhinitis due to pollen: Secondary | ICD-10-CM | POA: Diagnosis not present

## 2015-08-03 DIAGNOSIS — J3089 Other allergic rhinitis: Secondary | ICD-10-CM | POA: Diagnosis not present

## 2015-08-03 DIAGNOSIS — J301 Allergic rhinitis due to pollen: Secondary | ICD-10-CM | POA: Diagnosis not present

## 2015-08-06 DIAGNOSIS — J301 Allergic rhinitis due to pollen: Secondary | ICD-10-CM | POA: Diagnosis not present

## 2015-08-06 DIAGNOSIS — J3089 Other allergic rhinitis: Secondary | ICD-10-CM | POA: Diagnosis not present

## 2015-08-09 DIAGNOSIS — Z125 Encounter for screening for malignant neoplasm of prostate: Secondary | ICD-10-CM | POA: Diagnosis not present

## 2015-08-09 DIAGNOSIS — J301 Allergic rhinitis due to pollen: Secondary | ICD-10-CM | POA: Diagnosis not present

## 2015-08-09 DIAGNOSIS — J3089 Other allergic rhinitis: Secondary | ICD-10-CM | POA: Diagnosis not present

## 2015-08-09 DIAGNOSIS — Z0000 Encounter for general adult medical examination without abnormal findings: Secondary | ICD-10-CM | POA: Diagnosis not present

## 2015-08-09 DIAGNOSIS — N5201 Erectile dysfunction due to arterial insufficiency: Secondary | ICD-10-CM | POA: Diagnosis not present

## 2015-08-09 DIAGNOSIS — Z1159 Encounter for screening for other viral diseases: Secondary | ICD-10-CM | POA: Diagnosis not present

## 2015-08-09 DIAGNOSIS — R351 Nocturia: Secondary | ICD-10-CM | POA: Diagnosis not present

## 2015-08-09 DIAGNOSIS — Z136 Encounter for screening for cardiovascular disorders: Secondary | ICD-10-CM | POA: Diagnosis not present

## 2015-08-09 DIAGNOSIS — N401 Enlarged prostate with lower urinary tract symptoms: Secondary | ICD-10-CM | POA: Diagnosis not present

## 2015-08-09 DIAGNOSIS — N3281 Overactive bladder: Secondary | ICD-10-CM | POA: Diagnosis not present

## 2015-08-12 DIAGNOSIS — Z0000 Encounter for general adult medical examination without abnormal findings: Secondary | ICD-10-CM | POA: Diagnosis not present

## 2015-08-13 DIAGNOSIS — J3089 Other allergic rhinitis: Secondary | ICD-10-CM | POA: Diagnosis not present

## 2015-08-13 DIAGNOSIS — J301 Allergic rhinitis due to pollen: Secondary | ICD-10-CM | POA: Diagnosis not present

## 2015-08-17 DIAGNOSIS — J3089 Other allergic rhinitis: Secondary | ICD-10-CM | POA: Diagnosis not present

## 2015-08-17 DIAGNOSIS — J301 Allergic rhinitis due to pollen: Secondary | ICD-10-CM | POA: Diagnosis not present

## 2015-08-23 DIAGNOSIS — J3089 Other allergic rhinitis: Secondary | ICD-10-CM | POA: Diagnosis not present

## 2015-08-23 DIAGNOSIS — J301 Allergic rhinitis due to pollen: Secondary | ICD-10-CM | POA: Diagnosis not present

## 2015-08-30 DIAGNOSIS — J3089 Other allergic rhinitis: Secondary | ICD-10-CM | POA: Diagnosis not present

## 2015-08-30 DIAGNOSIS — J301 Allergic rhinitis due to pollen: Secondary | ICD-10-CM | POA: Diagnosis not present

## 2015-09-06 DIAGNOSIS — J301 Allergic rhinitis due to pollen: Secondary | ICD-10-CM | POA: Diagnosis not present

## 2015-09-06 DIAGNOSIS — J3089 Other allergic rhinitis: Secondary | ICD-10-CM | POA: Diagnosis not present

## 2015-09-09 DIAGNOSIS — H1045 Other chronic allergic conjunctivitis: Secondary | ICD-10-CM | POA: Diagnosis not present

## 2015-09-09 DIAGNOSIS — J3089 Other allergic rhinitis: Secondary | ICD-10-CM | POA: Diagnosis not present

## 2015-09-09 DIAGNOSIS — J301 Allergic rhinitis due to pollen: Secondary | ICD-10-CM | POA: Diagnosis not present

## 2015-09-14 DIAGNOSIS — J3089 Other allergic rhinitis: Secondary | ICD-10-CM | POA: Diagnosis not present

## 2015-09-14 DIAGNOSIS — J301 Allergic rhinitis due to pollen: Secondary | ICD-10-CM | POA: Diagnosis not present

## 2015-09-20 DIAGNOSIS — J301 Allergic rhinitis due to pollen: Secondary | ICD-10-CM | POA: Diagnosis not present

## 2015-09-20 DIAGNOSIS — J3089 Other allergic rhinitis: Secondary | ICD-10-CM | POA: Diagnosis not present

## 2015-09-27 DIAGNOSIS — J3089 Other allergic rhinitis: Secondary | ICD-10-CM | POA: Diagnosis not present

## 2015-09-27 DIAGNOSIS — J301 Allergic rhinitis due to pollen: Secondary | ICD-10-CM | POA: Diagnosis not present

## 2015-10-04 DIAGNOSIS — J3089 Other allergic rhinitis: Secondary | ICD-10-CM | POA: Diagnosis not present

## 2015-10-04 DIAGNOSIS — J301 Allergic rhinitis due to pollen: Secondary | ICD-10-CM | POA: Diagnosis not present

## 2015-10-11 DIAGNOSIS — J3089 Other allergic rhinitis: Secondary | ICD-10-CM | POA: Diagnosis not present

## 2015-10-11 DIAGNOSIS — J301 Allergic rhinitis due to pollen: Secondary | ICD-10-CM | POA: Diagnosis not present

## 2015-10-13 DIAGNOSIS — N5201 Erectile dysfunction due to arterial insufficiency: Secondary | ICD-10-CM | POA: Diagnosis not present

## 2015-10-13 DIAGNOSIS — R972 Elevated prostate specific antigen [PSA]: Secondary | ICD-10-CM | POA: Diagnosis not present

## 2015-10-13 DIAGNOSIS — N401 Enlarged prostate with lower urinary tract symptoms: Secondary | ICD-10-CM | POA: Diagnosis not present

## 2015-10-13 DIAGNOSIS — N3281 Overactive bladder: Secondary | ICD-10-CM | POA: Diagnosis not present

## 2015-10-13 DIAGNOSIS — R351 Nocturia: Secondary | ICD-10-CM | POA: Diagnosis not present

## 2015-10-20 DIAGNOSIS — J3089 Other allergic rhinitis: Secondary | ICD-10-CM | POA: Diagnosis not present

## 2015-10-20 DIAGNOSIS — J301 Allergic rhinitis due to pollen: Secondary | ICD-10-CM | POA: Diagnosis not present

## 2015-10-25 DIAGNOSIS — J3089 Other allergic rhinitis: Secondary | ICD-10-CM | POA: Diagnosis not present

## 2015-10-25 DIAGNOSIS — J301 Allergic rhinitis due to pollen: Secondary | ICD-10-CM | POA: Diagnosis not present

## 2015-10-26 DIAGNOSIS — H524 Presbyopia: Secondary | ICD-10-CM | POA: Diagnosis not present

## 2015-10-26 DIAGNOSIS — H2513 Age-related nuclear cataract, bilateral: Secondary | ICD-10-CM | POA: Diagnosis not present

## 2015-10-26 DIAGNOSIS — H04123 Dry eye syndrome of bilateral lacrimal glands: Secondary | ICD-10-CM | POA: Diagnosis not present

## 2015-11-02 DIAGNOSIS — J301 Allergic rhinitis due to pollen: Secondary | ICD-10-CM | POA: Diagnosis not present

## 2015-11-02 DIAGNOSIS — J3089 Other allergic rhinitis: Secondary | ICD-10-CM | POA: Diagnosis not present

## 2015-11-03 DIAGNOSIS — R5383 Other fatigue: Secondary | ICD-10-CM | POA: Diagnosis not present

## 2015-11-03 DIAGNOSIS — E039 Hypothyroidism, unspecified: Secondary | ICD-10-CM | POA: Diagnosis not present

## 2015-11-09 DIAGNOSIS — J301 Allergic rhinitis due to pollen: Secondary | ICD-10-CM | POA: Diagnosis not present

## 2015-11-09 DIAGNOSIS — J3089 Other allergic rhinitis: Secondary | ICD-10-CM | POA: Diagnosis not present

## 2015-11-15 DIAGNOSIS — J301 Allergic rhinitis due to pollen: Secondary | ICD-10-CM | POA: Diagnosis not present

## 2015-11-23 DIAGNOSIS — J301 Allergic rhinitis due to pollen: Secondary | ICD-10-CM | POA: Diagnosis not present

## 2015-11-23 DIAGNOSIS — J3089 Other allergic rhinitis: Secondary | ICD-10-CM | POA: Diagnosis not present

## 2015-11-30 DIAGNOSIS — J3089 Other allergic rhinitis: Secondary | ICD-10-CM | POA: Diagnosis not present

## 2015-11-30 DIAGNOSIS — J301 Allergic rhinitis due to pollen: Secondary | ICD-10-CM | POA: Diagnosis not present

## 2015-12-07 DIAGNOSIS — J301 Allergic rhinitis due to pollen: Secondary | ICD-10-CM | POA: Diagnosis not present

## 2015-12-07 DIAGNOSIS — J3089 Other allergic rhinitis: Secondary | ICD-10-CM | POA: Diagnosis not present

## 2015-12-14 DIAGNOSIS — R079 Chest pain, unspecified: Secondary | ICD-10-CM | POA: Diagnosis not present

## 2015-12-15 DIAGNOSIS — J301 Allergic rhinitis due to pollen: Secondary | ICD-10-CM | POA: Diagnosis not present

## 2015-12-15 DIAGNOSIS — J3089 Other allergic rhinitis: Secondary | ICD-10-CM | POA: Diagnosis not present

## 2015-12-16 DIAGNOSIS — H04123 Dry eye syndrome of bilateral lacrimal glands: Secondary | ICD-10-CM | POA: Diagnosis not present

## 2015-12-16 DIAGNOSIS — H524 Presbyopia: Secondary | ICD-10-CM | POA: Diagnosis not present

## 2015-12-16 DIAGNOSIS — G4700 Insomnia, unspecified: Secondary | ICD-10-CM | POA: Diagnosis not present

## 2015-12-16 DIAGNOSIS — H2513 Age-related nuclear cataract, bilateral: Secondary | ICD-10-CM | POA: Diagnosis not present

## 2015-12-16 DIAGNOSIS — D3131 Benign neoplasm of right choroid: Secondary | ICD-10-CM | POA: Diagnosis not present

## 2015-12-16 DIAGNOSIS — G4733 Obstructive sleep apnea (adult) (pediatric): Secondary | ICD-10-CM | POA: Diagnosis not present

## 2015-12-20 DIAGNOSIS — H35433 Paving stone degeneration of retina, bilateral: Secondary | ICD-10-CM | POA: Diagnosis not present

## 2015-12-20 DIAGNOSIS — J3089 Other allergic rhinitis: Secondary | ICD-10-CM | POA: Diagnosis not present

## 2015-12-20 DIAGNOSIS — J301 Allergic rhinitis due to pollen: Secondary | ICD-10-CM | POA: Diagnosis not present

## 2015-12-20 DIAGNOSIS — D3131 Benign neoplasm of right choroid: Secondary | ICD-10-CM | POA: Diagnosis not present

## 2015-12-22 DIAGNOSIS — J3089 Other allergic rhinitis: Secondary | ICD-10-CM | POA: Diagnosis not present

## 2015-12-22 DIAGNOSIS — J301 Allergic rhinitis due to pollen: Secondary | ICD-10-CM | POA: Diagnosis not present

## 2015-12-28 DIAGNOSIS — J3089 Other allergic rhinitis: Secondary | ICD-10-CM | POA: Diagnosis not present

## 2015-12-28 DIAGNOSIS — J301 Allergic rhinitis due to pollen: Secondary | ICD-10-CM | POA: Diagnosis not present

## 2015-12-29 DIAGNOSIS — I10 Essential (primary) hypertension: Secondary | ICD-10-CM | POA: Diagnosis not present

## 2015-12-29 DIAGNOSIS — R079 Chest pain, unspecified: Secondary | ICD-10-CM | POA: Diagnosis not present

## 2015-12-29 DIAGNOSIS — G4733 Obstructive sleep apnea (adult) (pediatric): Secondary | ICD-10-CM | POA: Diagnosis not present

## 2015-12-29 DIAGNOSIS — E668 Other obesity: Secondary | ICD-10-CM | POA: Diagnosis not present

## 2015-12-31 DIAGNOSIS — J3089 Other allergic rhinitis: Secondary | ICD-10-CM | POA: Diagnosis not present

## 2015-12-31 DIAGNOSIS — J301 Allergic rhinitis due to pollen: Secondary | ICD-10-CM | POA: Diagnosis not present

## 2016-01-04 DIAGNOSIS — J301 Allergic rhinitis due to pollen: Secondary | ICD-10-CM | POA: Diagnosis not present

## 2016-01-04 DIAGNOSIS — J3089 Other allergic rhinitis: Secondary | ICD-10-CM | POA: Diagnosis not present

## 2016-01-05 ENCOUNTER — Other Ambulatory Visit: Payer: Self-pay | Admitting: Otolaryngology

## 2016-01-05 DIAGNOSIS — E041 Nontoxic single thyroid nodule: Secondary | ICD-10-CM

## 2016-01-10 ENCOUNTER — Ambulatory Visit
Admission: RE | Admit: 2016-01-10 | Discharge: 2016-01-10 | Disposition: A | Discharge status: Home / Self Care | Payer: Medicare Other | Source: Ambulatory Visit | Attending: Otolaryngology | Admitting: Otolaryngology

## 2016-01-10 DIAGNOSIS — E041 Nontoxic single thyroid nodule: Secondary | ICD-10-CM

## 2016-01-10 DIAGNOSIS — J3089 Other allergic rhinitis: Secondary | ICD-10-CM | POA: Diagnosis not present

## 2016-01-10 DIAGNOSIS — E042 Nontoxic multinodular goiter: Secondary | ICD-10-CM | POA: Diagnosis not present

## 2016-01-10 DIAGNOSIS — J301 Allergic rhinitis due to pollen: Secondary | ICD-10-CM | POA: Diagnosis not present

## 2016-01-10 IMAGING — US US THYROID
1 series · 13 of 25 positions shown · non-contrast
Comparison: Prior thyroid ultrasound [DATE]

CLINICAL DATA: 65-year-old male with a history of a substernal
thyroid goiter status post left thyroidectomy

EXAM:
THYROID ULTRASOUND
TECHNIQUE: Ultrasound examination of the thyroid gland and adjacent soft
tissues was performed.

[Series 1: us thyroid · 0.06mm/px · 13 of 69 slices shown]
[im 1/69]
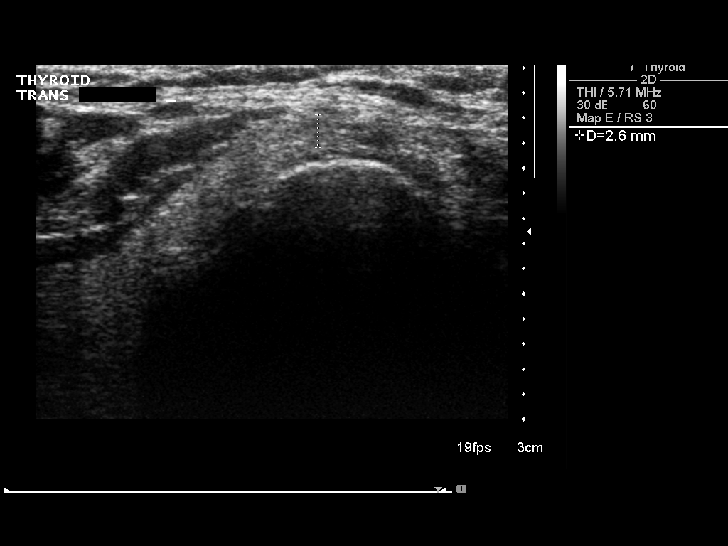
[im 6/69]
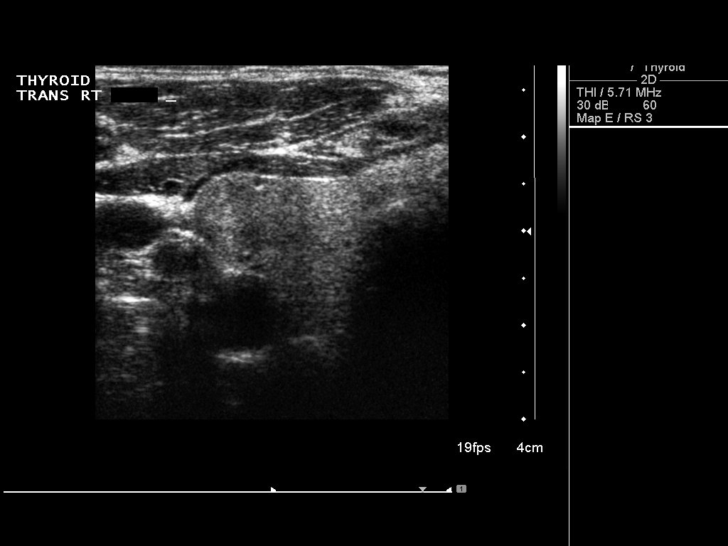
[im 12/69]
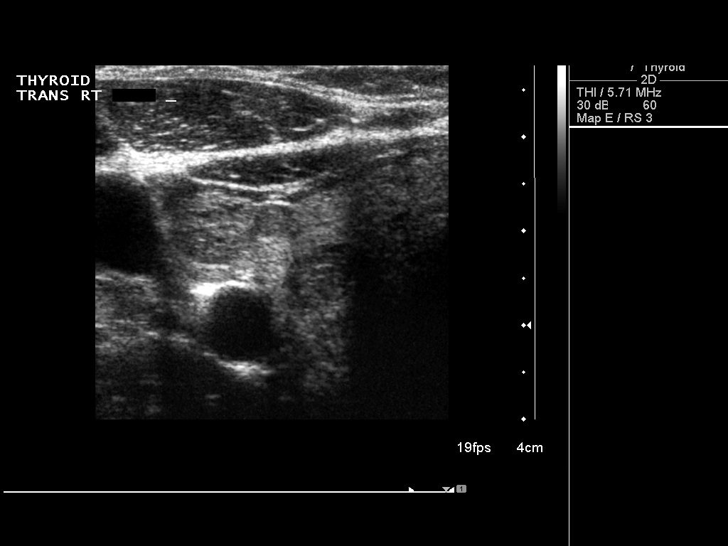
[im 18/69]
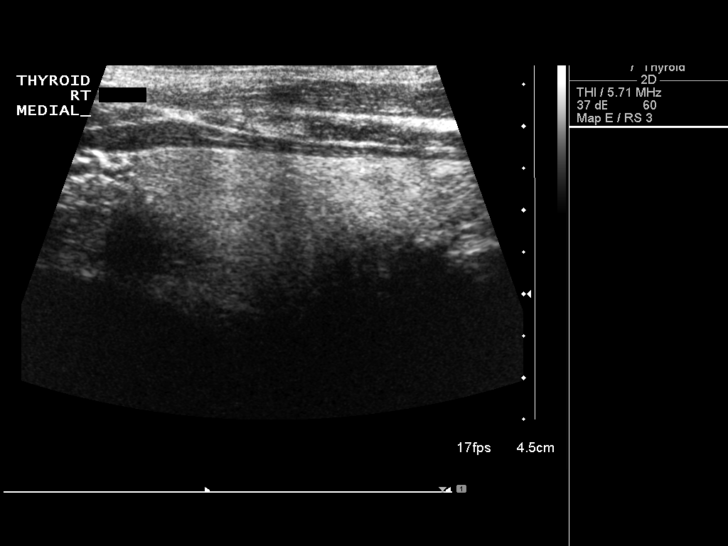
[im 23/69]
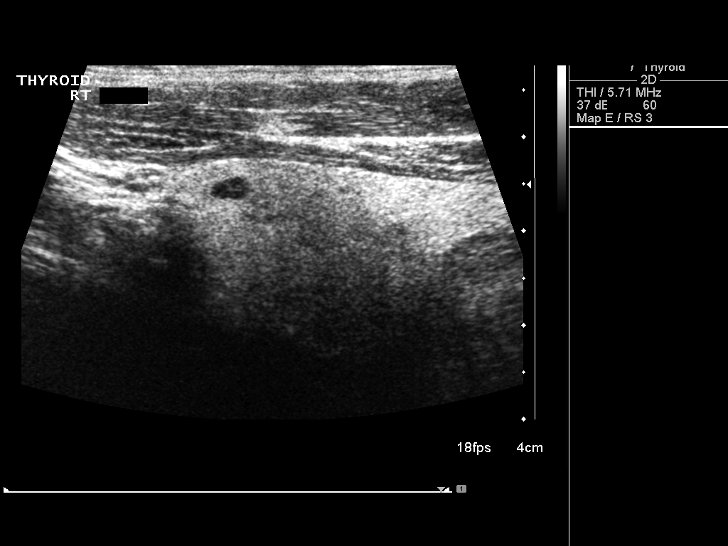
[im 29/69]
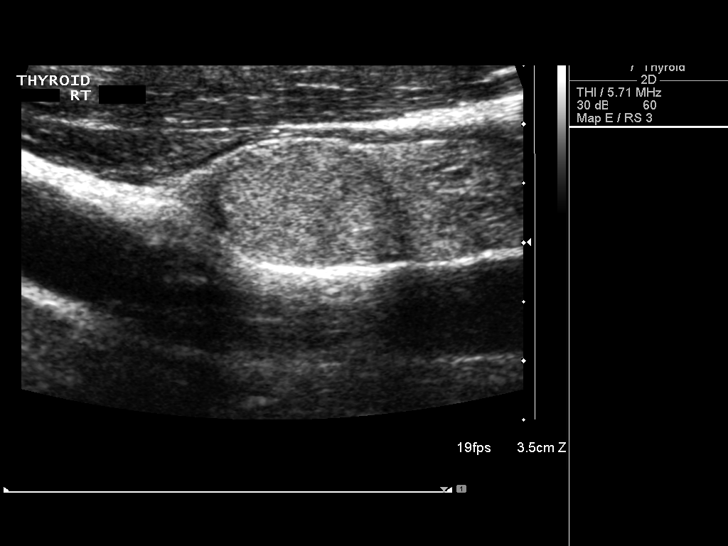
[im 35/69]
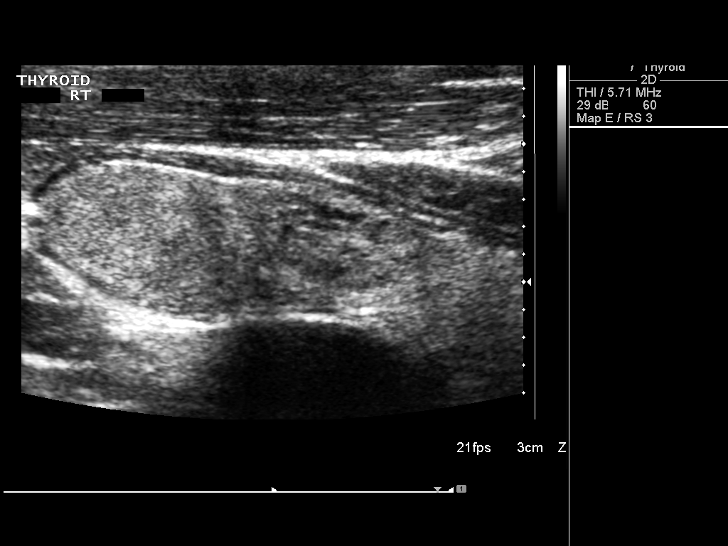
[im 40/69]
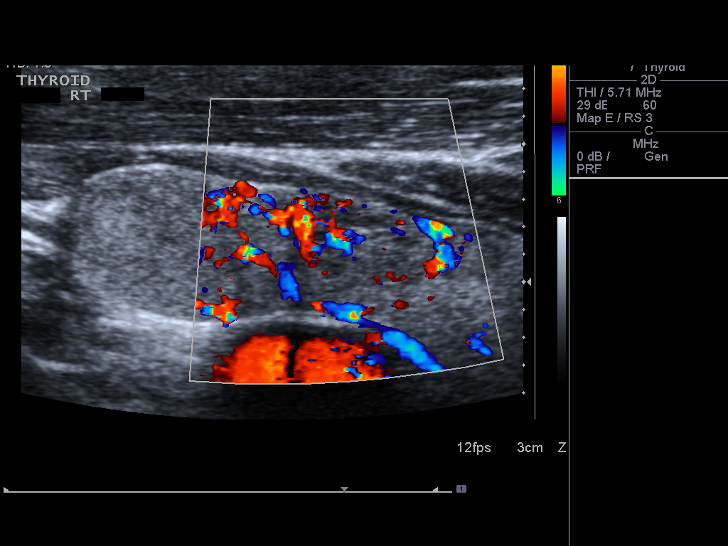
[im 46/69]
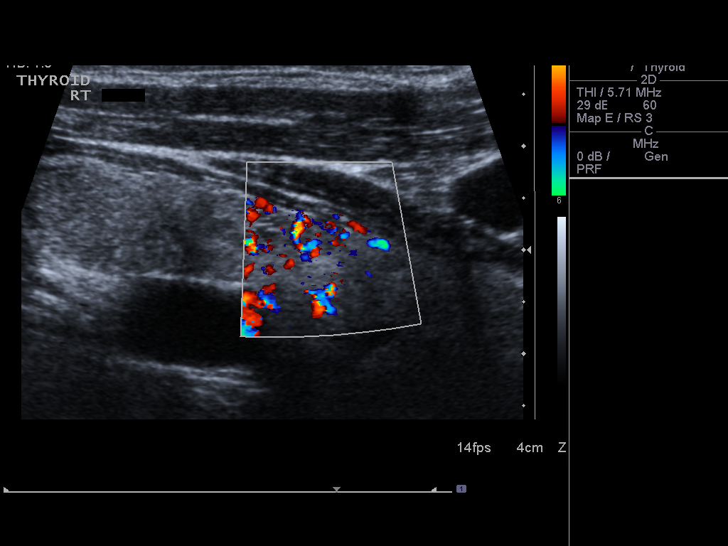
[im 52/69]
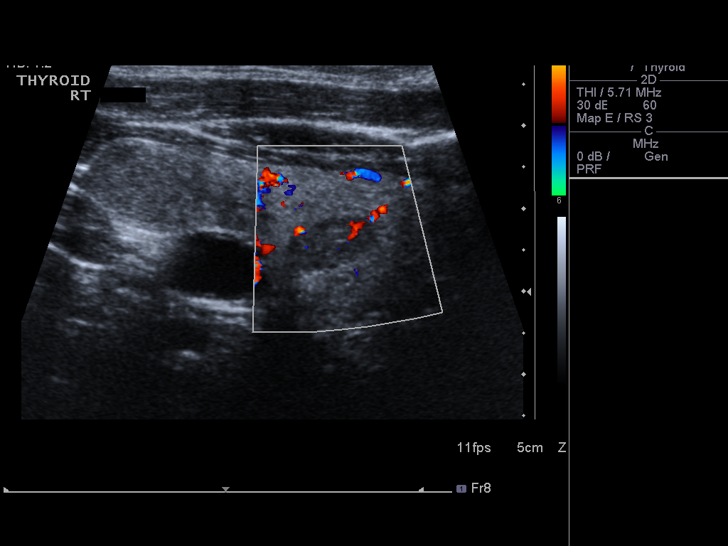
[im 57/69]
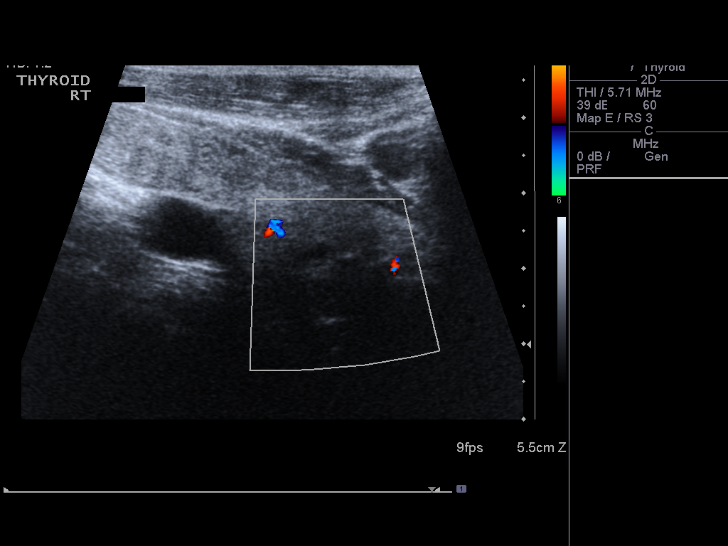
[im 63/69]
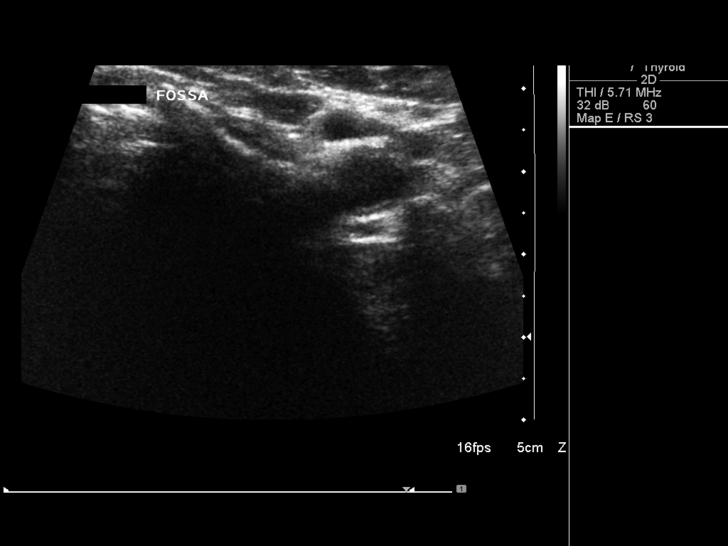
[im 69/69]
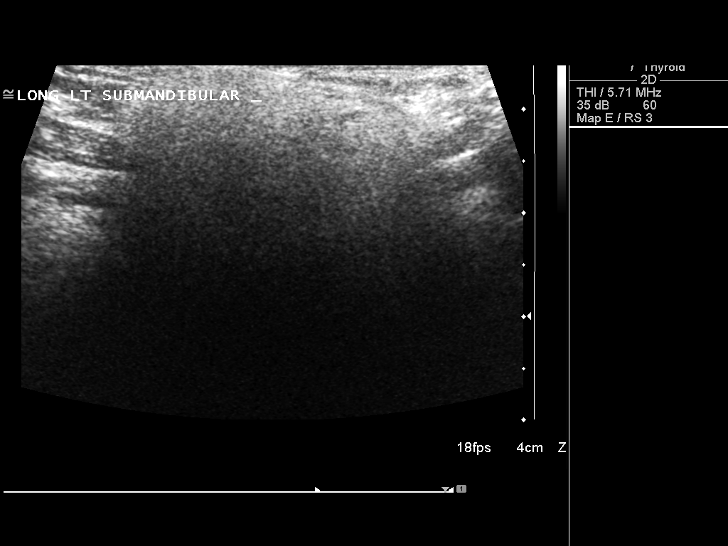

[13 of 25 positions shown; findings below may reference images not displayed]

FINDINGS: Right thyroid lobe

Measurements: 5.8 x 2.0 x 2.9 cm (previously 4.8 x 1.8 x 2.1 cm).
Numerous right-sided thyroid nodules. The largest nodule is
isoechoic with a thin peripheral echogenic halo. Using similar
measurement techniques the nodule demonstrates no significant
interval change in size at 1.6 x 1.1 x 1.1 cm.

Although the reported measurements on the prior study are smaller,
the nodule was measured without including the hypoechoic halo. When
measured similarly, there is no significant interval change. No
internal echogenic foci or other suspicious features. Mildly
hypoechoic spongiform nodule slightly more inferior in the mid gland
measures up to 1.3 cm. No internal suspicious features. Additional
smaller nodules scattered throughout the gland.

Left thyroid lobe

Surgically absent.

Isthmus

Thickness: 0.3 cm, unchanged.  No nodules visualized.

Lymphadenopathy

None visualized.
IMPRESSION: Surgical changes of prior left thyroidectomy.

No significant interval change in the numerous right-sided thyroid
nodules.

## 2016-01-17 DIAGNOSIS — J301 Allergic rhinitis due to pollen: Secondary | ICD-10-CM | POA: Diagnosis not present

## 2016-01-17 DIAGNOSIS — J3089 Other allergic rhinitis: Secondary | ICD-10-CM | POA: Diagnosis not present

## 2016-01-25 DIAGNOSIS — J3089 Other allergic rhinitis: Secondary | ICD-10-CM | POA: Diagnosis not present

## 2016-01-25 DIAGNOSIS — J301 Allergic rhinitis due to pollen: Secondary | ICD-10-CM | POA: Diagnosis not present

## 2016-01-31 ENCOUNTER — Emergency Department (HOSPITAL_BASED_OUTPATIENT_CLINIC_OR_DEPARTMENT_OTHER)
Admission: EM | Admit: 2016-01-31 | Discharge: 2016-01-31 | Disposition: A | Discharge status: Home / Self Care | Payer: Medicare Other | Attending: Emergency Medicine | Admitting: Emergency Medicine

## 2016-01-31 ENCOUNTER — Encounter (HOSPITAL_BASED_OUTPATIENT_CLINIC_OR_DEPARTMENT_OTHER): Payer: Self-pay | Admitting: *Deleted

## 2016-01-31 ENCOUNTER — Emergency Department (HOSPITAL_BASED_OUTPATIENT_CLINIC_OR_DEPARTMENT_OTHER): Payer: Medicare Other

## 2016-01-31 DIAGNOSIS — R109 Unspecified abdominal pain: Secondary | ICD-10-CM | POA: Diagnosis present

## 2016-01-31 DIAGNOSIS — Z7982 Long term (current) use of aspirin: Secondary | ICD-10-CM | POA: Insufficient documentation

## 2016-01-31 DIAGNOSIS — K5732 Diverticulitis of large intestine without perforation or abscess without bleeding: Secondary | ICD-10-CM | POA: Insufficient documentation

## 2016-01-31 DIAGNOSIS — R1032 Left lower quadrant pain: Secondary | ICD-10-CM | POA: Diagnosis not present

## 2016-01-31 HISTORY — DX: Gastro-esophageal reflux disease without esophagitis: K21.9

## 2016-01-31 LAB — CBC WITH DIFFERENTIAL/PLATELET
BASOS PCT: 0 %
Basophils Absolute: 0 10*3/uL (ref 0.0–0.1)
EOS ABS: 0 10*3/uL (ref 0.0–0.7)
EOS PCT: 0 %
HCT: 41.7 % (ref 39.0–52.0)
HEMOGLOBIN: 15 g/dL (ref 13.0–17.0)
LYMPHS ABS: 1.9 10*3/uL (ref 0.7–4.0)
LYMPHS PCT: 19 %
MCH: 32.8 pg (ref 26.0–34.0)
MCHC: 36 g/dL (ref 30.0–36.0)
MCV: 91.2 fL (ref 78.0–100.0)
MONOS PCT: 11 %
Monocytes Absolute: 1.1 10*3/uL — ABNORMAL HIGH (ref 0.1–1.0)
NEUTROS ABS: 6.7 10*3/uL (ref 1.7–7.7)
NEUTROS PCT: 70 %
PLATELETS: 251 10*3/uL (ref 150–400)
RBC: 4.57 MIL/uL (ref 4.22–5.81)
RDW: 12.2 % (ref 11.5–15.5)
WBC: 9.7 10*3/uL (ref 4.0–10.5)

## 2016-01-31 LAB — COMPREHENSIVE METABOLIC PANEL
ALBUMIN: 4.4 g/dL (ref 3.5–5.0)
ALT: 16 U/L — AB (ref 17–63)
ANION GAP: 7 (ref 5–15)
AST: 19 U/L (ref 15–41)
Alkaline Phosphatase: 102 U/L (ref 38–126)
BUN: 15 mg/dL (ref 6–20)
CHLORIDE: 103 mmol/L (ref 101–111)
CO2: 24 mmol/L (ref 22–32)
CREATININE: 0.91 mg/dL (ref 0.61–1.24)
Calcium: 9 mg/dL (ref 8.9–10.3)
GFR calc non Af Amer: >60 mL/min (ref >60)
GLUCOSE: 101 mg/dL — AB (ref 65–99)
Potassium: 3.3 mmol/L — ABNORMAL LOW (ref 3.5–5.1)
SODIUM: 134 mmol/L — AB (ref 135–145)
Total Bilirubin: 1 mg/dL (ref 0.3–1.2)
Total Protein: 7.8 g/dL (ref 6.5–8.1)

## 2016-01-31 IMAGING — CT CT ABD-PELV W/ CM
2 of 5 series · 15 of 46 positions shown, 17 images · IV contrast (APPLIED)
Comparison: None.

CLINICAL DATA: Left lower quadrant pain for a couple of days, fever
today, constipation for 4-5 days

EXAM:
CT ABDOMEN AND PELVIS WITH CONTRAST
TECHNIQUE: Multidetector CT imaging of the abdomen and pelvis was performed
using the standard protocol following bolus administration of
intravenous contrast.
CONTRAST:  100mL [XM] IOPAMIDOL ([XM]) INJECTION 61%

[Series 2: axial st · axial · 0.94mm/px · z∈[-536,-81]mm · 12 of 103 slices shown, 14 images]
[im 6/103  soft-tissue]
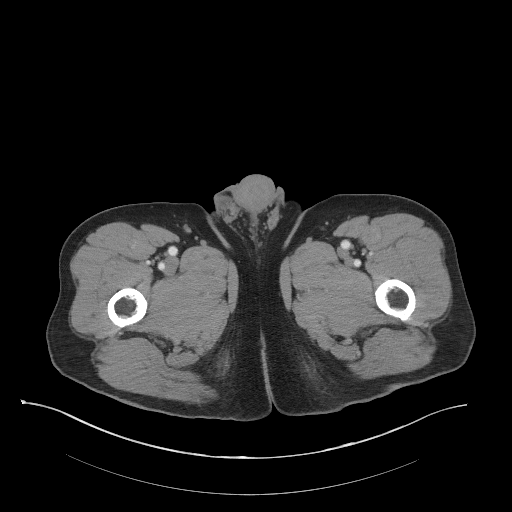
[im 6/103  bone]
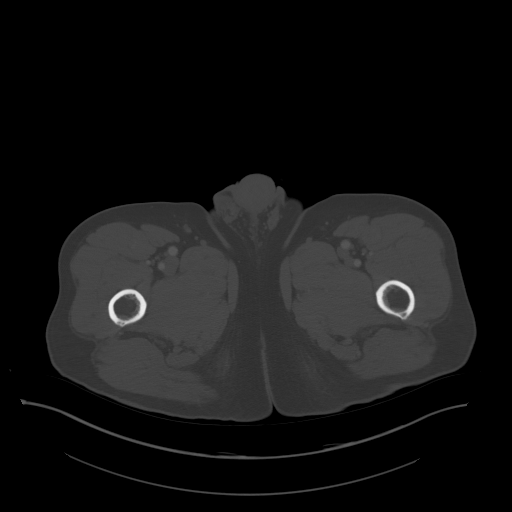
[im 18/103  soft-tissue]
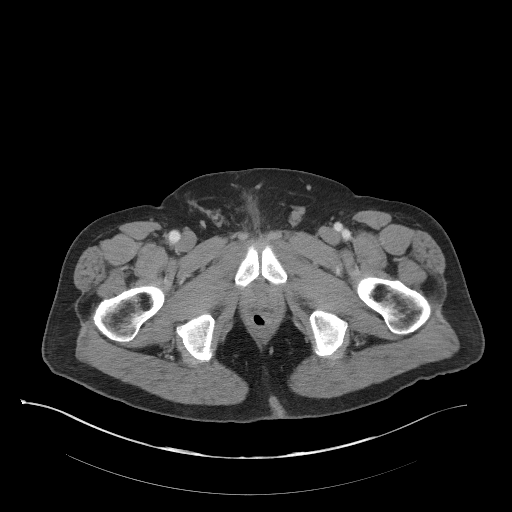
[im 23/103  soft-tissue]
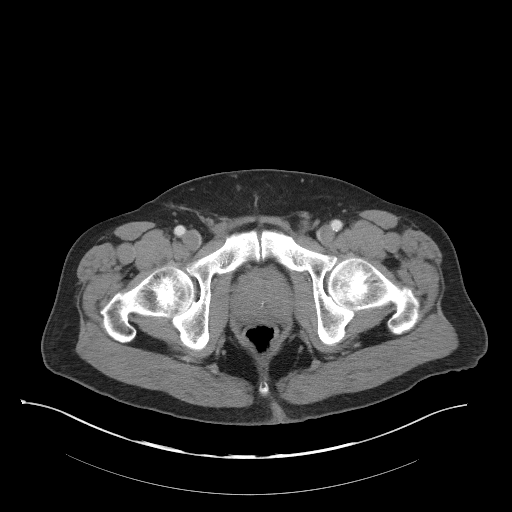
[im 29/103  soft-tissue]
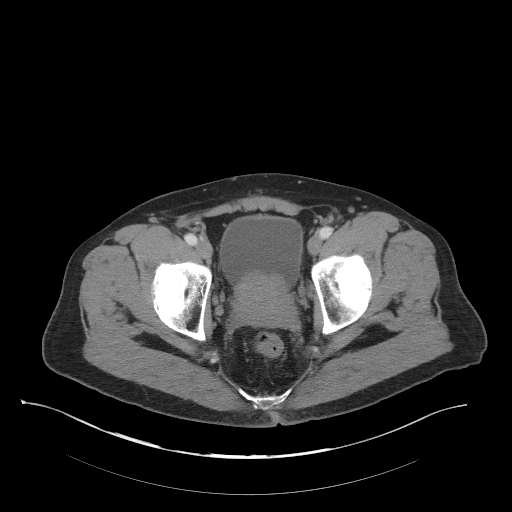
[im 40/103  soft-tissue]
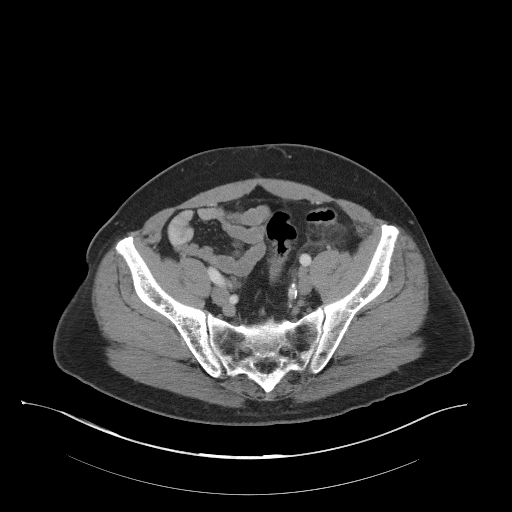
[im 46/103  soft-tissue]
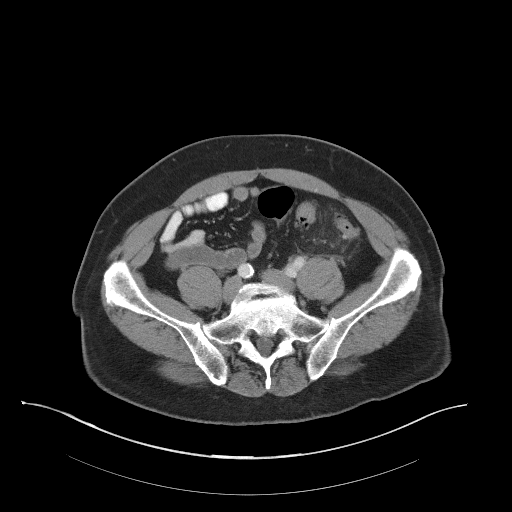
[im 57/103  soft-tissue]
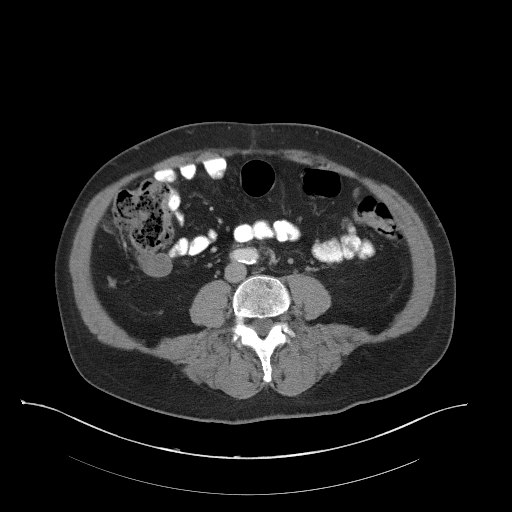
[im 63/103  soft-tissue]
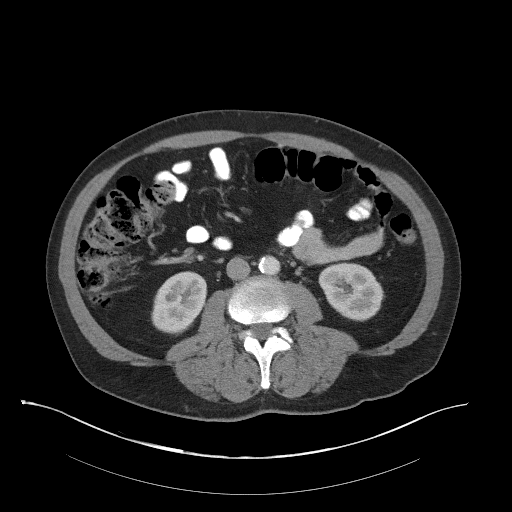
[im 74/103  soft-tissue]
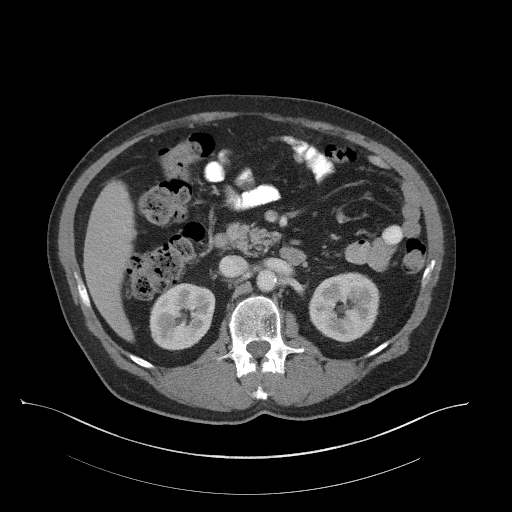
[im 74/103  bone]
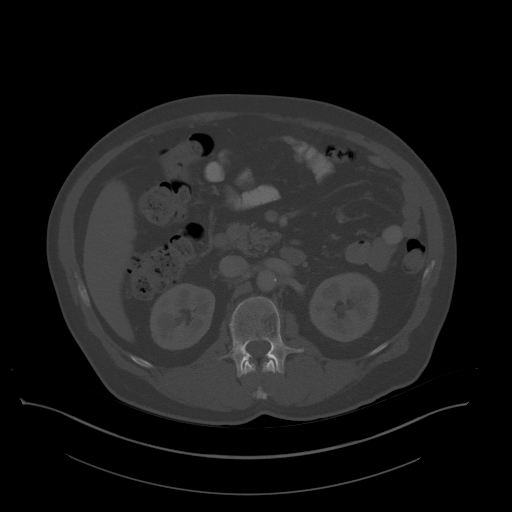
[im 80/103  soft-tissue]
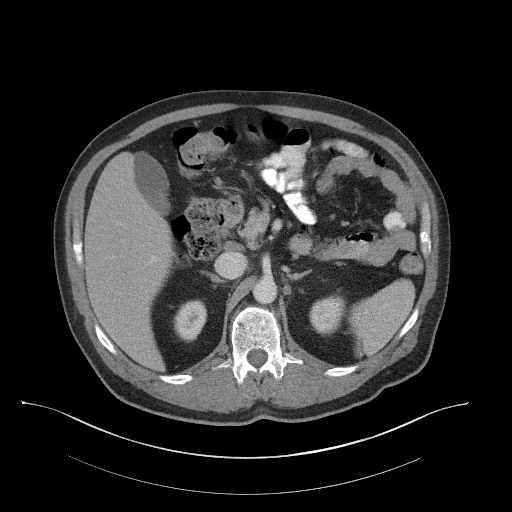
[im 86/103  soft-tissue]
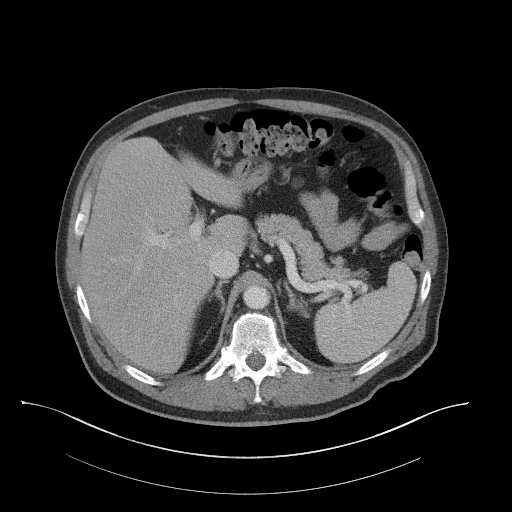
[im 97/103  soft-tissue]
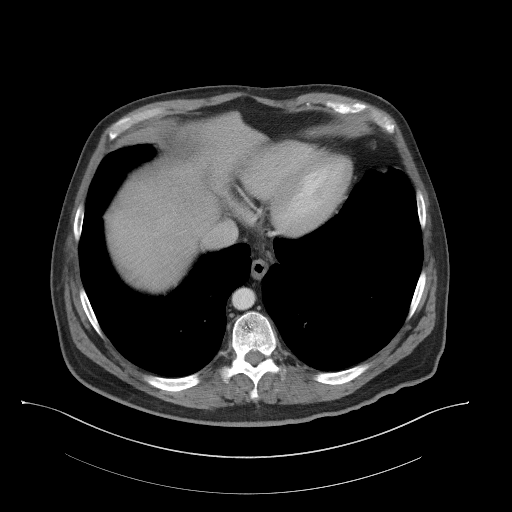

[Series 5: coronal st · coronal · 0.90mm/px · 3 of 101 slices shown]
[im 34/101  soft-tissue]
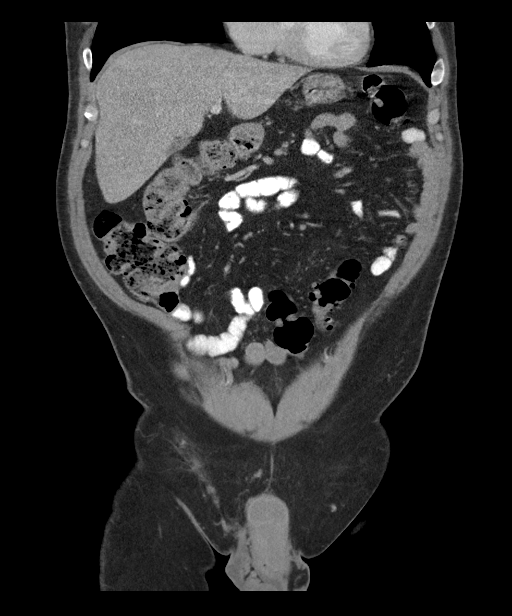
[im 45/101  soft-tissue]
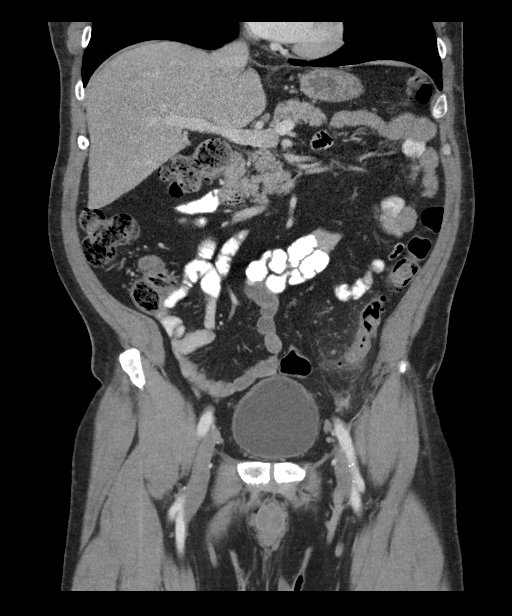
[im 56/101  soft-tissue]
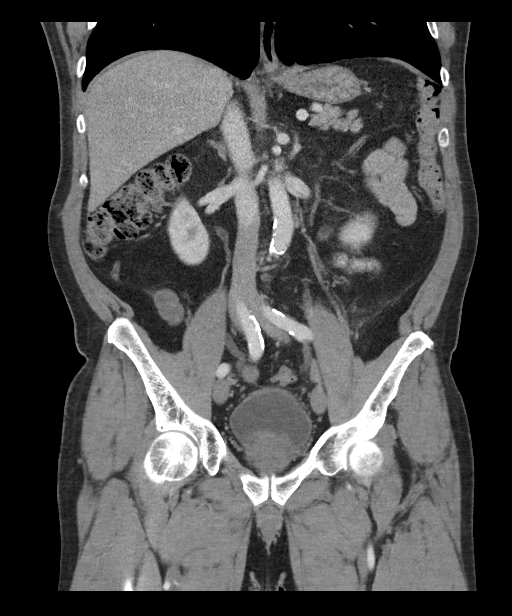

[15 of 46 positions shown; findings below may reference images not displayed]

FINDINGS: Lower chest:  No acute findings.

Hepatobiliary: Liver and gallbladder appear normal.

Pancreas: No mass, inflammatory changes, or other significant
abnormality.

Spleen: Within normal limits in size and appearance.

Adrenals/Urinary Tract: Adrenal glands appear normal. Kidneys appear
normal without mass, stone or hydronephrosis. No perinephric
inflammation. No ureteral or bladder calculi identified. Prostate
gland is moderately enlarged causing some mass effect on the bladder
base. Bladder appears otherwise unremarkable.

Stomach/Bowel: Scattered diverticulosis noted within the descending
and sigmoid colon. There is thickening of the walls of the colon at
the junction of the upper sigmoid colon and lower descending colon,
with pericolonic inflammation, consistent with acute diverticulitis.
More proximal colon is normal in caliber. Appendix is normal.
Stomach appears normal.

Vascular/Lymphatic: Scattered atherosclerotic changes of the normal
caliber abdominal aorta and pelvic vasculature. No enlarged lymph
nodes seen.

Reproductive: Prostate enlargement.  Otherwise unremarkable.

Other: No abscess collection.  No free intraperitoneal air.

Musculoskeletal: Degenerative changes throughout the thoracolumbar
spine, mild to moderate in degree. 9 mm sclerotic lesion within the
left iliac bone, too small to definitively characterize, possibly a
benign bone island.
IMPRESSION: 1. Acute diverticulitis at the junction of the upper sigmoid colon
and lower descending colon, with associated colonic wall thickening
and pericolonic inflammation. No evidence of bowel perforation. No
abscess collection. No associated bowel obstruction.
2. Moderate amount of stool and gas throughout the more proximal
colon, compatible with given history of constipation.
3. Diffuse enlargement of the prostate gland causing mass effect on
the bladder base. Recommend correlation with PSA lab values.
4. 9 mm sclerotic lesion within the left iliac bone, too small to
definitively characterize. This is favored to be a benign bone
island, however, in the setting of prostate enlargement, a small
blastic osseous metastasis cannot be completely excluded. In
addition to the PSA lab values, recommend correlation with nuclear
medicine bone scan.
5. Aortic atherosclerosis.

## 2016-01-31 MED ORDER — IOPAMIDOL (ISOVUE-300) INJECTION 61%
100.0000 mL | Freq: Once | INTRAVENOUS | Status: AC | PRN
  Administered 2016-01-31: 100 mL via INTRAVENOUS

## 2016-01-31 MED ORDER — ONDANSETRON 8 MG PO TBDP: 8.0000 mg | ORAL_TABLET | Freq: Three times a day (TID) | ORAL | 0 refills | Status: DC | PRN

## 2016-01-31 MED ORDER — CIPROFLOXACIN HCL 500 MG PO TABS
500.0000 mg | ORAL_TABLET | Freq: Once | ORAL | Status: AC
  Administered 2016-01-31: 500 mg via ORAL
  Filled 2016-01-31: qty 1

## 2016-01-31 MED ORDER — METRONIDAZOLE 500 MG PO TABS: 500.0000 mg | ORAL_TABLET | Freq: Two times a day (BID) | ORAL | 0 refills | Status: DC

## 2016-01-31 MED ORDER — SODIUM CHLORIDE 0.9 % IV SOLN: 1000.0000 mL | INTRAVENOUS | Status: DC

## 2016-01-31 MED ORDER — SODIUM CHLORIDE 0.9 % IV SOLN
1000.0000 mL | Freq: Once | INTRAVENOUS | Status: AC
  Administered 2016-01-31: 1000 mL via INTRAVENOUS

## 2016-01-31 MED ORDER — CIPROFLOXACIN HCL 500 MG PO TABS: 500.0000 mg | ORAL_TABLET | Freq: Two times a day (BID) | ORAL | 0 refills | Status: DC

## 2016-01-31 MED ORDER — HYDROCODONE-ACETAMINOPHEN 5-325 MG PO TABS: 1.0000 | ORAL_TABLET | ORAL | 0 refills | Status: DC | PRN

## 2016-01-31 MED ORDER — METRONIDAZOLE 500 MG PO TABS
500.0000 mg | ORAL_TABLET | Freq: Once | ORAL | Status: AC
  Administered 2016-01-31: 500 mg via ORAL
  Filled 2016-01-31: qty 1

## 2016-01-31 NOTE — ED Provider Notes (Signed)
[facility] Provider Note   CSN: [id number] Arrival date & time: 01/31/16  1758  By signing my name below, I, Dolores Hoose, attest that this documentation has been prepared under the direction and in the presence of Jola Schmidt, MD . Electronically Signed: Dolores Hoose, Scribe. 01/31/2016. 6:37 PM.  History   Chief Complaint Chief Complaint  Patient presents with  . Abdominal Pain   The history is provided by the patient and the spouse. No language interpreter was used.  Abdominal Pain      HPI Comments:  Cory Mathis is a 65 y.o. male with no pertinent PMHx who presents to the Emergency Department with his wife complaining of constant unchanged left sided abdominal pain onset two days ago. He reports associated fever (101) and constipation. He denies vomiting, diarrhea, hematuria, SOB, CP, nausea, bloody stool, dysuria, and difficulty walking. His pain is worsened on palpation. No alleviating factors indicated. Pt reports he is compliant with  medication for prostate, allergies, and acid reflux. His PCP is Dr. [name].   Past Medical History:  Diagnosis Date  . Enlarged prostate   . GERD (gastroesophageal reflux disease)     There are no active problems to display for this patient.   Past Surgical History:  Procedure Laterality Date  . HERNIA REPAIR    . KNEE ARTHROSCOPY    . THYROIDECTOMY         Home Medications    Prior to Admission medications   Medication Sig Start Date End Date Taking? Authorizing Provider  aspirin 81 MG chewable tablet Chew by mouth daily.   Yes Historical Provider, MD  ranitidine (ZANTAC) 150 MG tablet Take 150 mg by mouth 2 (two) times daily.   Yes Historical Provider, MD  tamsulosin (FLOMAX) 0.4 MG CAPS capsule Take 0.4 mg by mouth.   Yes Historical Provider, MD    Family History History reviewed. No pertinent family history.  Social History Social History  Substance Use Topics  . Smoking status: Never Smoker    . Smokeless tobacco: Not on file  . Alcohol use No     Allergies   Sulfa antibiotics   Review of Systems Review of Systems  Gastrointestinal: Positive for abdominal pain.   10 Systems reviewed and are negative for acute change except as noted in the HPI.  Physical Exam Updated Vital Signs BP 152/69 (BP Location: Right Arm)   Pulse (!) 57   Temp 98.8 F (37.1 C) (Oral)   Resp 18   Ht 5\' 11"  (1.803 m)   Wt 220 lb (99.8 kg)   SpO2 98%   BMI 30.68 kg/m   Physical Exam  Constitutional: He is oriented to person, place, and time. He appears well-developed and well-nourished.  HENT:  Head: Normocephalic and atraumatic.  Eyes: EOM are normal.  Neck: Normal range of motion.  Cardiovascular: Normal rate, regular rhythm, normal heart sounds and intact distal pulses.   Pulmonary/Chest: Effort normal and breath sounds normal. No respiratory distress.  Abdominal: Soft. He exhibits no distension. There is no tenderness.  Musculoskeletal: Normal range of motion.  Neurological: He is alert and oriented to person, place, and time.  Skin: Skin is warm and dry.  Psychiatric: He has a normal mood and affect. Judgment normal.  Nursing note and vitals reviewed.    ED Treatments / Results  DIAGNOSTIC STUDIES:  Oxygen Saturation is 99% on RA, normal by my interpretation.    COORDINATION OF CARE:  6:37 PM Discussed treatment plan with  pt at bedside which included ibuprofen, CT scan and pt agreed to plan.  Labs (all labs ordered are listed, but only abnormal results are displayed) Labs Reviewed  CBC WITH DIFFERENTIAL/PLATELET - Abnormal; Notable for the following:       Result Value   Monocytes Absolute 1.1 (*)    All other components within normal limits  COMPREHENSIVE METABOLIC PANEL - Abnormal; Notable for the following:    Sodium 134 (*)    Potassium 3.3 (*)    Glucose, Bld 101 (*)    ALT 16 (*)    All other components within normal limits    EKG  EKG  Interpretation None       Radiology Ct Abdomen Pelvis W Contrast  Result Date: 01/31/2016 CLINICAL DATA:  Left lower quadrant pain for a couple of days, fever today, constipation for 4-5 days EXAM: CT ABDOMEN AND PELVIS WITH CONTRAST TECHNIQUE: Multidetector CT imaging of the abdomen and pelvis was performed using the standard protocol following bolus administration of intravenous contrast. CONTRAST:  16mL ISOVUE-300 IOPAMIDOL (ISOVUE-300) INJECTION 61% COMPARISON:  None. FINDINGS: Lower chest:  No acute findings. Hepatobiliary: Liver and gallbladder appear normal. Pancreas: No mass, inflammatory changes, or other significant abnormality. Spleen: Within normal limits in size and appearance. Adrenals/Urinary Tract: Adrenal glands appear normal. Kidneys appear normal without mass, stone or hydronephrosis. No perinephric inflammation. No ureteral or bladder calculi identified. Prostate gland is moderately enlarged causing some mass effect on the bladder base. Bladder appears otherwise unremarkable. Stomach/Bowel: Scattered diverticulosis noted within the descending and sigmoid colon. There is thickening of the walls of the colon at the junction of the upper sigmoid colon and lower descending colon, with pericolonic inflammation, consistent with acute diverticulitis. More proximal colon is normal in caliber. Appendix is normal. Stomach appears normal. Vascular/Lymphatic: Scattered atherosclerotic changes of the normal caliber abdominal aorta and pelvic vasculature. No enlarged lymph nodes seen. Reproductive: Prostate enlargement.  Otherwise unremarkable. Other: No abscess collection.  No free intraperitoneal air. Musculoskeletal: Degenerative changes throughout the thoracolumbar spine, mild to moderate in degree. 9 mm sclerotic lesion within the left iliac bone, too small to definitively characterize, possibly a benign bone island. IMPRESSION: 1. Acute diverticulitis at the junction of the upper sigmoid colon  and lower descending colon, with associated colonic wall thickening and pericolonic inflammation. No evidence of bowel perforation. No abscess collection. No associated bowel obstruction. 2. Moderate amount of stool and gas throughout the more proximal colon, compatible with given history of constipation. 3. Diffuse enlargement of the prostate gland causing mass effect on the bladder base. Recommend correlation with PSA lab values. 4. 9 mm sclerotic lesion within the left iliac bone, too small to definitively characterize. This is favored to be a benign bone island, however, in the setting of prostate enlargement, a small blastic osseous metastasis cannot be completely excluded. In addition to the PSA lab values, recommend correlation with nuclear medicine bone scan. 5. Aortic atherosclerosis. Electronically Signed   By: Franki Cabot M.D.   On: 01/31/2016 20:36    Procedures Procedures (including critical care time)  Medications Ordered in ED Medications  0.9 %  sodium chloride infusion (1,000 mLs Intravenous New Bag/Given 01/31/16 1907)    Followed by  0.9 %  sodium chloride infusion (not administered)  ciprofloxacin (CIPRO) tablet 500 mg (not administered)  metroNIDAZOLE (FLAGYL) tablet 500 mg (not administered)  iopamidol (ISOVUE-300) 61 % injection 100 mL (100 mLs Intravenous Contrast Given 01/31/16 2008)     Initial Impression / Assessment  and Plan / ED Course  I have reviewed the triage vital signs and the nursing notes.  Pertinent labs & imaging results that were available during my care of the patient were reviewed by me and considered in my medical decision making (see chart for details).  Clinical Course    Uncomplicated acute diverticulitis. Dc home with cipro and flagyl  Final Clinical Impressions(s) / ED Diagnoses   Final diagnoses:  Diverticulitis of large intestine without perforation or abscess without bleeding    New Prescriptions New Prescriptions   CIPROFLOXACIN  (CIPRO) 500 MG TABLET    Take 1 tablet (500 mg total) by mouth 2 (two) times daily.   HYDROCODONE-ACETAMINOPHEN (NORCO/VICODIN) 5-325 MG TABLET    Take 1 tablet by mouth every 4 (four) hours as needed for moderate pain.   METRONIDAZOLE (FLAGYL) 500 MG TABLET    Take 1 tablet (500 mg total) by mouth 2 (two) times daily.   ONDANSETRON (ZOFRAN ODT) 8 MG DISINTEGRATING TABLET    Take 1 tablet (8 mg total) by mouth every 8 (eight) hours as needed for nausea or vomiting.    I personally performed the services described in this documentation, which was scribed in my presence. The recorded information has been reviewed and is accurate.        Jola Schmidt, MD 01/31/16 2106

## 2016-01-31 NOTE — ED Notes (Signed)
Pt reports he has been very constipated lately

## 2016-01-31 NOTE — ED Notes (Signed)
Pt reports left sided abdominal pain and fever. Fever started today and pain for a couple days. Reports pain is only present when pushing on the area. Pt reports pain feels like a very sharp pain

## 2016-01-31 NOTE — ED Triage Notes (Addendum)
Pt c/o left lower abd pain x 3 days with fever sent here from PMD office for eval

## 2016-02-01 DIAGNOSIS — J301 Allergic rhinitis due to pollen: Secondary | ICD-10-CM | POA: Diagnosis not present

## 2016-02-01 DIAGNOSIS — J3089 Other allergic rhinitis: Secondary | ICD-10-CM | POA: Diagnosis not present

## 2016-02-08 DIAGNOSIS — J301 Allergic rhinitis due to pollen: Secondary | ICD-10-CM | POA: Diagnosis not present

## 2016-02-08 DIAGNOSIS — J3089 Other allergic rhinitis: Secondary | ICD-10-CM | POA: Diagnosis not present

## 2016-02-10 DIAGNOSIS — Z23 Encounter for immunization: Secondary | ICD-10-CM | POA: Diagnosis not present

## 2016-02-15 DIAGNOSIS — J301 Allergic rhinitis due to pollen: Secondary | ICD-10-CM | POA: Diagnosis not present

## 2016-02-15 DIAGNOSIS — J3089 Other allergic rhinitis: Secondary | ICD-10-CM | POA: Diagnosis not present

## 2016-02-21 DIAGNOSIS — J301 Allergic rhinitis due to pollen: Secondary | ICD-10-CM | POA: Diagnosis not present

## 2016-02-21 DIAGNOSIS — J3089 Other allergic rhinitis: Secondary | ICD-10-CM | POA: Diagnosis not present

## 2016-02-28 DIAGNOSIS — J301 Allergic rhinitis due to pollen: Secondary | ICD-10-CM | POA: Diagnosis not present

## 2016-02-28 DIAGNOSIS — J3089 Other allergic rhinitis: Secondary | ICD-10-CM | POA: Diagnosis not present

## 2016-03-06 DIAGNOSIS — J301 Allergic rhinitis due to pollen: Secondary | ICD-10-CM | POA: Diagnosis not present

## 2016-03-06 DIAGNOSIS — J3089 Other allergic rhinitis: Secondary | ICD-10-CM | POA: Diagnosis not present

## 2016-03-14 DIAGNOSIS — J3089 Other allergic rhinitis: Secondary | ICD-10-CM | POA: Diagnosis not present

## 2016-03-14 DIAGNOSIS — J301 Allergic rhinitis due to pollen: Secondary | ICD-10-CM | POA: Diagnosis not present

## 2016-03-16 DIAGNOSIS — Z23 Encounter for immunization: Secondary | ICD-10-CM | POA: Diagnosis not present

## 2016-03-21 DIAGNOSIS — J301 Allergic rhinitis due to pollen: Secondary | ICD-10-CM | POA: Diagnosis not present

## 2016-03-21 DIAGNOSIS — J3089 Other allergic rhinitis: Secondary | ICD-10-CM | POA: Diagnosis not present

## 2016-03-27 DIAGNOSIS — J3089 Other allergic rhinitis: Secondary | ICD-10-CM | POA: Diagnosis not present

## 2016-03-27 DIAGNOSIS — J301 Allergic rhinitis due to pollen: Secondary | ICD-10-CM | POA: Diagnosis not present

## 2016-04-04 DIAGNOSIS — J301 Allergic rhinitis due to pollen: Secondary | ICD-10-CM | POA: Diagnosis not present

## 2016-04-04 DIAGNOSIS — J3089 Other allergic rhinitis: Secondary | ICD-10-CM | POA: Diagnosis not present

## 2016-04-07 DIAGNOSIS — J3089 Other allergic rhinitis: Secondary | ICD-10-CM | POA: Diagnosis not present

## 2016-04-07 DIAGNOSIS — J301 Allergic rhinitis due to pollen: Secondary | ICD-10-CM | POA: Diagnosis not present

## 2016-04-10 DIAGNOSIS — J301 Allergic rhinitis due to pollen: Secondary | ICD-10-CM | POA: Diagnosis not present

## 2016-04-10 DIAGNOSIS — J3089 Other allergic rhinitis: Secondary | ICD-10-CM | POA: Diagnosis not present

## 2016-04-16 DIAGNOSIS — L299 Pruritus, unspecified: Secondary | ICD-10-CM | POA: Diagnosis not present

## 2016-04-17 DIAGNOSIS — J3089 Other allergic rhinitis: Secondary | ICD-10-CM | POA: Diagnosis not present

## 2016-04-17 DIAGNOSIS — J301 Allergic rhinitis due to pollen: Secondary | ICD-10-CM | POA: Diagnosis not present

## 2016-04-17 DIAGNOSIS — L308 Other specified dermatitis: Secondary | ICD-10-CM | POA: Diagnosis not present

## 2016-04-17 DIAGNOSIS — L218 Other seborrheic dermatitis: Secondary | ICD-10-CM | POA: Diagnosis not present

## 2016-04-25 DIAGNOSIS — J301 Allergic rhinitis due to pollen: Secondary | ICD-10-CM | POA: Diagnosis not present

## 2016-04-25 DIAGNOSIS — J3089 Other allergic rhinitis: Secondary | ICD-10-CM | POA: Diagnosis not present

## 2016-04-28 ENCOUNTER — Ambulatory Visit (INDEPENDENT_AMBULATORY_CARE_PROVIDER_SITE_OTHER): Payer: Medicare Other | Admitting: Physician Assistant

## 2016-04-28 ENCOUNTER — Encounter: Payer: Self-pay | Admitting: Physician Assistant

## 2016-04-28 VITALS — BP 120/64 | HR 58 | Ht 71.0 in | Wt 221.0 lb

## 2016-04-28 DIAGNOSIS — K5732 Diverticulitis of large intestine without perforation or abscess without bleeding: Secondary | ICD-10-CM

## 2016-04-28 MED ORDER — METRONIDAZOLE 500 MG PO TABS: 500.0000 mg | ORAL_TABLET | Freq: Two times a day (BID) | ORAL | 0 refills | Status: AC

## 2016-04-28 MED ORDER — CIPROFLOXACIN HCL 500 MG PO TABS: 500.0000 mg | ORAL_TABLET | Freq: Two times a day (BID) | ORAL | 0 refills | Status: AC

## 2016-04-28 NOTE — Progress Notes (Addendum)
Chief Complaint: LLQ abdominal pain  HPI:    Cory Mathis is a 65 year old male who presents to clinic today, as a self-referral, with a complaint of ongoing LLQ abdominal pain.    Per chart review patient was seen in the ED on 01/31/2016, independent review of records shows that he presented complaining of a constant unchanged left-sided abdominal pain which had started 2 days previous as well as a fever and constipation. At that time he had a CT of the abdomen and pelvis which showed acute diverticulitis at the junction of the upper sigmoid colon and lower descending colon with associated colonic wall thickening and pericolonic inflammation. With no evidence of bowel perforation. No abscess collection. No associated bowel obstruction. Resume moderate amount of stool and gas throughout the more proximal colon, compatible with given history of constipation. Diffuse enlargement of the prostate gland causing mass effect on the bladder base. 4.9 mm sclerotic lesion within the left iliac bone. Too small to be definitively characterized. Aortic atherosclerosis. CBC was within normal limits and CMP showed a decreased sodium and potassium and was otherwise normal. The patient was discharged home with Cipro 500 mg twice a day and Flagyl 500 mg twice a day.   Today, the patient tells me that he took the antibiotic for about a week, but he believes he only took one antibiotic, not the two which were prescribed. He was doing well over the past couple of months until Saturday when he started again with a left lower quadrant abdominal pain and some diarrhea. When he awoke Sunday morning this was worse. He rates it as an 8/10 at that time which seemed to be worse when lying down at night. He took 2 Aleve that day and the pain was somewhat better, throughout this week it has remained at a constant 3/ 10. He tells me that he is somewhat constipated at the moment. The patient denies any episodes of diverticulitis before  August of this year.    Patient's social history is positive for moving to the area about a year ago from [place]. He does provide the name of his gastroenterologist there where he received screening colonoscopies, the last in 2014. Patient does tell me that he works out on a daily basis, but feels as though he may be dehydrated as he "doesn't drink a lot of water". Patient is also leaving for [place] on Saturday and would like to make sure that he doesn't have an episode of diverticulitis while away/ this pain doesn't get worse.   Patient denies fever, chills, blood in his stool, melena, weight loss, fatigue and anorexia, nausea, vomiting, heartburn or reflux.      Past Medical History:  Diagnosis Date  . Enlarged prostate   . GERD (gastroesophageal reflux disease)     Past Surgical History:  Procedure Laterality Date  . HERNIA REPAIR    . KNEE ARTHROSCOPY    . THYROIDECTOMY      Current Outpatient Prescriptions  Medication Sig Dispense Refill  . aspirin 81 MG chewable tablet Chew by mouth daily.    Marland Kitchen HYDROcodone-acetaminophen (NORCO/VICODIN) 5-325 MG tablet Take 1 tablet by mouth every 4 (four) hours as needed for moderate pain. 15 tablet 0  . ondansetron (ZOFRAN ODT) 8 MG disintegrating tablet Take 1 tablet (8 mg total) by mouth every 8 (eight) hours as needed for nausea or vomiting. 10 tablet 0  . ranitidine (ZANTAC) 150 MG tablet Take 150 mg by mouth 2 (two) times daily.    Marland Kitchen  tamsulosin (FLOMAX) 0.4 MG CAPS capsule Take 0.4 mg by mouth.     No current facility-administered medications for this visit.     Allergies as of 04/28/2016 - Review Complete 04/28/2016  Allergen Reaction Noted  . Sulfa antibiotics  01/31/2016    Family History: Patient denies family history of liver or pancreatic disease, colon cancer or colon polyps  Social History   Social History  . Marital status: Married    Spouse name: N/A  . Number of children: N/A  . Years of education: N/A    Occupational History  . Not on file.   Social History Main Topics  . Smoking status: Never Smoker  . Smokeless tobacco: Not on file  . Alcohol use No  . Drug use: Unknown  . Sexual activity: Not on file   Other Topics Concern  . Not on file   Social History Narrative  . No narrative on file    Review of Systems:    Constitutional: No weight loss, fever, chills, weakness or fatigue HEENT: Eyes: No change in vision               Ears, Nose, Throat:  No change in hearing or congestion Skin: Positive for itching No rash  Cardiovascular: No chest pain, chest pressure or palpitations   Respiratory: No SOB or cough Gastrointestinal: See HPI and otherwise negative Genitourinary: Positive for increased urine frequency No dysuria Neurological: No headache, dizziness or syncope Musculoskeletal: No new muscle or joint pain Hematologic: No bleeding or bruising Psychiatric: No history of depression or anxiety   Physical Exam:  Vital signs: BP 120/64   Pulse (!) 58   Ht 5\' 11"  (1.803 m)   Wt 221 lb (100.2 kg)   BMI 30.82 kg/m    Constitutional:   Pleasant Caucasian male appears to be in NAD, Well developed, Well nourished, alert and cooperative Head:  Normocephalic and atraumatic. Eyes:   PEERL, EOMI. No icterus. Conjunctiva pink. Ears:  Normal auditory acuity. Neck:  Supple Throat: Oral cavity and pharynx without inflammation, swelling or lesion.  Respiratory: Respirations even and unlabored. Lungs clear to auscultation bilaterally.   No wheezes, crackles, or rhonchi.  Cardiovascular: Normal S1, S2. No MRG. Regular rate and rhythm. No peripheral edema, cyanosis or pallor.  Gastronintestinal:  Soft, nondistended, mod ttp to palpation in LLQ No rebound or guarding. Normal bowel sounds. No appreciable masses or hepatomegaly. Rectal:  Not performed.  Msk:  Symmetrical without gross deformities. Without edema, no deformity or joint abnormality.  Neurologic:  Alert and  oriented  x4;  grossly normal neurologically.  Skin:   Dry and intact without significant lesions or rashes. Psychiatric: Oriented to person, place and time. Demonstrates good judgement and reason without abnormal affect or behaviors.  Most recent LABS AND IMAGING: CBC    Component Value Date/Time   WBC 9.7 01/31/2016 1910   RBC 4.57 01/31/2016 1910   HGB 15.0 01/31/2016 1910   HCT 41.7 01/31/2016 1910   PLT 251 01/31/2016 1910   MCV 91.2 01/31/2016 1910   MCH 32.8 01/31/2016 1910   MCHC 36.0 01/31/2016 1910   RDW 12.2 01/31/2016 1910   LYMPHSABS 1.9 01/31/2016 1910   MONOABS 1.1 (H) 01/31/2016 1910   EOSABS 0.0 01/31/2016 1910   BASOSABS 0.0 01/31/2016 1910    CMP     Component Value Date/Time   NA 134 (L) 01/31/2016 1910   K 3.3 (L) 01/31/2016 1910   CL 103 01/31/2016 1910   CO2 24  01/31/2016 1910   GLUCOSE 101 (H) 01/31/2016 1910   BUN 15 01/31/2016 1910   CREATININE 0.91 01/31/2016 1910   CALCIUM 9.0 01/31/2016 1910   PROT 7.8 01/31/2016 1910   ALBUMIN 4.4 01/31/2016 1910   AST 19 01/31/2016 1910   ALT 16 (L) 01/31/2016 1910   ALKPHOS 102 01/31/2016 1910   BILITOT 1.0 01/31/2016 1910   GFRNONAA >60 01/31/2016 1910   GFRAA >60 01/31/2016 1910   EXAM: CT ABDOMEN AND PELVIS WITH CONTRAST 01/31/16  TECHNIQUE: Multidetector CT imaging of the abdomen and pelvis was performed using the standard protocol following bolus administration of intravenous contrast.  CONTRAST:  163mL ISOVUE-300 IOPAMIDOL (ISOVUE-300) INJECTION 61%  COMPARISON:  None.  FINDINGS: Lower chest:  No acute findings.  Hepatobiliary: Liver and gallbladder appear normal.  Pancreas: No mass, inflammatory changes, or other significant abnormality.  Spleen: Within normal limits in size and appearance.  Adrenals/Urinary Tract: Adrenal glands appear normal. Kidneys appear normal without mass, stone or hydronephrosis. No perinephric inflammation. No ureteral or bladder calculi identified.  Prostate gland is moderately enlarged causing some mass effect on the bladder base. Bladder appears otherwise unremarkable.  Stomach/Bowel: Scattered diverticulosis noted within the descending and sigmoid colon. There is thickening of the walls of the colon at the junction of the upper sigmoid colon and lower descending colon, with pericolonic inflammation, consistent with acute diverticulitis. More proximal colon is normal in caliber. Appendix is normal. Stomach appears normal.  Vascular/Lymphatic: Scattered atherosclerotic changes of the normal caliber abdominal aorta and pelvic vasculature. No enlarged lymph nodes seen.  Reproductive: Prostate enlargement.  Otherwise unremarkable.  Other: No abscess collection.  No free intraperitoneal air.  Musculoskeletal: Degenerative changes throughout the thoracolumbar spine, mild to moderate in degree. 9 mm sclerotic lesion within the left iliac bone, too small to definitively characterize, possibly a benign bone island.  IMPRESSION: 1. Acute diverticulitis at the junction of the upper sigmoid colon and lower descending colon, with associated colonic wall thickening and pericolonic inflammation. No evidence of bowel perforation. No abscess collection. No associated bowel obstruction. 2. Moderate amount of stool and gas throughout the more proximal colon, compatible with given history of constipation. 3. Diffuse enlargement of the prostate gland causing mass effect on the bladder base. Recommend correlation with PSA lab values. 4. 9 mm sclerotic lesion within the left iliac bone, too small to definitively characterize. This is favored to be a benign bone island, however, in the setting of prostate enlargement, a small blastic osseous metastasis cannot be completely excluded. In addition to the PSA lab values, recommend correlation with nuclear medicine bone scan. 5. Aortic atherosclerosis.   Electronically Signed   By: Franki Cabot M.D.   On: 01/31/2016 20:36  Assessment: 1. Diverticulitis: Patient reports improvement in his left lower quadrant pain initially with 1 week of ciprofloxacin 500 mg twice a day, he was doing well over the past couple of months but now has repeat left lower quadrant abdominal pain which was initially an 8/10, now a constant 3/10 with constipation, admits to not drinking enough water and signs of dehydration, also patient tells me only took ciprofloxacin and not the Flagyl as prescribed; this likely represents smoldering diverticulitis  Plan: 1. Prescribed ciprofloxacin 500 mg twice a day 10 days. 2. Prescribed metronidazole 500 mg 3 times a day 10 days 3. Recommend the patient maintain a low fiber/ low residue diet during time of antibiotic treatment and for 2-3 days afterwards. Provided handout. After that time the patient should  gradually increased to a high fiber diet, 25-35 g per day. 4. Recommend the patient maintain adequate hydration. We did discuss that he should drink be drinking at least 8 8 ounce glasses of water per day if not more. 5. Requested records from patient's previous GI clinic in [place]. Dr. Sharlet Salina [phone]. After receiving these records we can better ascertain when the patient's next colonoscopy is due. 6. Patient to follow in clinic as needed in the future. He will be a Dr. Fuller Plan patient as he is the supervising physician today.  Cory Newer, PA-C [facility] 04/28/2016, 9:33 AM  Addendum: Record review:   Records were received from [facility] endoscopy. Patient had colonoscopy 03/28/13 for a personal history of non-advanced adenoma and high risk colon cancer surveillance. This was performed by Dr. Ferrel Logan impression was of one small polyp in the descending colon, diverticulosis in the sigmoid colon and nonbleeding internal hemorrhoids. Patient was told to repeat colonoscopy in 3-5 years for surveillance. We do not have  pathology.    Previous colonoscopy was performed 03/29/07 with a finding of a small sessile polyp in the cecum and moderate hemorrhoids in the rectum, biopsies from that time showed tubular adenoma.  Patient had EGD completed 10/30/2008 which was grossly normal to the EG junction with erosions/ulcerations at the Z line, noted grade 2 esophagitis as well as a sliding hiatal hernia and erythema in the antrum and duodenal bulb, it was otherwise normal. Patient was diagnosed with reflux esophagitis, hiatal hernia and gastritis at that time. 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  Repeat colo recommended in 5 yrs due to h/o one small adenomatous polyp in 2008. Though we do not have pathology from previous clinic and colo in 2014-will ask for more records. JLL

## 2016-04-28 NOTE — Progress Notes (Signed)
Reviewed and agree with management plan.  Brenda Cowher T. Joaquim Tolen, MD FACG 

## 2016-04-28 NOTE — Patient Instructions (Signed)
We sent prescriptions to [place], [place].  1. Ciprofloxin 500 mg 2. Metronidazole 500 mg.   We have provided you with a Low Fiber handout.

## 2016-05-10 ENCOUNTER — Telehealth: Payer: Self-pay | Admitting: Physician Assistant

## 2016-05-10 NOTE — Telephone Encounter (Signed)
See alternate note  

## 2016-05-10 NOTE — Telephone Encounter (Signed)
Left message with family member to have pt return call  

## 2016-05-10 NOTE — Telephone Encounter (Signed)
Pt walked in and states that he finished his abx (cipro and flagyl) on Monday and continues to have loose stools and feels "spacey".  He also states he developed pain in his shoulder, leg, and foot shortly after starting the abx (cipro and flagyl).  The pain has since resolved.  After speaking with him for a few minutes he stated he also developed a "white coating" on his tongue.  I did visualize a whitish coating covering the entire tongue. He seemed very anxious and stated "so you don't think I'm dying".  I advised him I did not think his symptoms were GI related and if he feels "spacey" and "off"  He may want to call his PCP for eval to ease his mind.  I will forward to Ellouise Newer for recommendations.  I advised the pt I would call him later this afternoon.

## 2016-05-10 NOTE — Telephone Encounter (Signed)
Please send recommendations.Thanks

## 2016-05-10 NOTE — Telephone Encounter (Signed)
Left message on machine to call back at both home and cell number

## 2016-05-10 NOTE — Telephone Encounter (Signed)
Pt returned call and states to call his cell, I called that number and left a message to have the pt return my call.

## 2016-05-10 NOTE — Telephone Encounter (Signed)
Pt has been given the recommendations of Ellouise Newer, he will call his PCP for an appt to be evaluated.

## 2016-05-11 DIAGNOSIS — H6980 Other specified disorders of Eustachian tube, unspecified ear: Secondary | ICD-10-CM | POA: Diagnosis not present

## 2016-05-17 DIAGNOSIS — J3089 Other allergic rhinitis: Secondary | ICD-10-CM | POA: Diagnosis not present

## 2016-05-17 DIAGNOSIS — J301 Allergic rhinitis due to pollen: Secondary | ICD-10-CM | POA: Diagnosis not present

## 2016-05-19 DIAGNOSIS — B370 Candidal stomatitis: Secondary | ICD-10-CM | POA: Insufficient documentation

## 2016-05-19 DIAGNOSIS — H6983 Other specified disorders of Eustachian tube, bilateral: Secondary | ICD-10-CM | POA: Insufficient documentation

## 2016-05-19 DIAGNOSIS — R42 Dizziness and giddiness: Secondary | ICD-10-CM | POA: Insufficient documentation

## 2016-05-23 ENCOUNTER — Telehealth: Payer: Self-pay | Admitting: Physician Assistant

## 2016-05-23 DIAGNOSIS — J3089 Other allergic rhinitis: Secondary | ICD-10-CM | POA: Diagnosis not present

## 2016-05-23 DIAGNOSIS — J301 Allergic rhinitis due to pollen: Secondary | ICD-10-CM | POA: Diagnosis not present

## 2016-05-24 NOTE — Telephone Encounter (Signed)
Left message on machine for pt to call and set up colon with Dr Fuller Plan at his convenience

## 2016-05-24 NOTE — Telephone Encounter (Signed)
Records were reviewed. We do not have pathology from colon in 2014, but pt did have h/o adenomatous polyps prior. He can go ahead and schedule screening colonoscopy. Thanks-JLL

## 2016-05-24 NOTE — Telephone Encounter (Signed)
Anderson Malta have you reviewed records for this pt?

## 2016-05-25 DIAGNOSIS — J3089 Other allergic rhinitis: Secondary | ICD-10-CM | POA: Diagnosis not present

## 2016-05-25 DIAGNOSIS — J301 Allergic rhinitis due to pollen: Secondary | ICD-10-CM | POA: Diagnosis not present

## 2016-05-26 ENCOUNTER — Encounter: Payer: Self-pay | Admitting: Gastroenterology

## 2016-05-29 DIAGNOSIS — J301 Allergic rhinitis due to pollen: Secondary | ICD-10-CM | POA: Diagnosis not present

## 2016-05-29 DIAGNOSIS — J3089 Other allergic rhinitis: Secondary | ICD-10-CM | POA: Diagnosis not present

## 2016-06-01 DIAGNOSIS — D1801 Hemangioma of skin and subcutaneous tissue: Secondary | ICD-10-CM | POA: Diagnosis not present

## 2016-06-01 DIAGNOSIS — L814 Other melanin hyperpigmentation: Secondary | ICD-10-CM | POA: Diagnosis not present

## 2016-06-01 DIAGNOSIS — D2262 Melanocytic nevi of left upper limb, including shoulder: Secondary | ICD-10-CM | POA: Diagnosis not present

## 2016-06-01 DIAGNOSIS — D2261 Melanocytic nevi of right upper limb, including shoulder: Secondary | ICD-10-CM | POA: Diagnosis not present

## 2016-06-01 DIAGNOSIS — D225 Melanocytic nevi of trunk: Secondary | ICD-10-CM | POA: Diagnosis not present

## 2016-06-01 DIAGNOSIS — D2239 Melanocytic nevi of other parts of face: Secondary | ICD-10-CM | POA: Diagnosis not present

## 2016-06-01 DIAGNOSIS — D2271 Melanocytic nevi of right lower limb, including hip: Secondary | ICD-10-CM | POA: Diagnosis not present

## 2016-06-01 DIAGNOSIS — D2362 Other benign neoplasm of skin of left upper limb, including shoulder: Secondary | ICD-10-CM | POA: Diagnosis not present

## 2016-06-01 DIAGNOSIS — D2372 Other benign neoplasm of skin of left lower limb, including hip: Secondary | ICD-10-CM | POA: Diagnosis not present

## 2016-06-01 DIAGNOSIS — D2272 Melanocytic nevi of left lower limb, including hip: Secondary | ICD-10-CM | POA: Diagnosis not present

## 2016-06-01 DIAGNOSIS — L821 Other seborrheic keratosis: Secondary | ICD-10-CM | POA: Diagnosis not present

## 2016-06-01 DIAGNOSIS — D2371 Other benign neoplasm of skin of right lower limb, including hip: Secondary | ICD-10-CM | POA: Diagnosis not present

## 2016-06-02 DIAGNOSIS — J301 Allergic rhinitis due to pollen: Secondary | ICD-10-CM | POA: Diagnosis not present

## 2016-06-02 DIAGNOSIS — J3089 Other allergic rhinitis: Secondary | ICD-10-CM | POA: Diagnosis not present

## 2016-06-06 DIAGNOSIS — J3089 Other allergic rhinitis: Secondary | ICD-10-CM | POA: Diagnosis not present

## 2016-06-06 DIAGNOSIS — J301 Allergic rhinitis due to pollen: Secondary | ICD-10-CM | POA: Diagnosis not present

## 2016-06-15 DIAGNOSIS — J301 Allergic rhinitis due to pollen: Secondary | ICD-10-CM | POA: Diagnosis not present

## 2016-06-15 DIAGNOSIS — J3089 Other allergic rhinitis: Secondary | ICD-10-CM | POA: Diagnosis not present

## 2016-06-19 DIAGNOSIS — J301 Allergic rhinitis due to pollen: Secondary | ICD-10-CM | POA: Diagnosis not present

## 2016-06-19 DIAGNOSIS — J3089 Other allergic rhinitis: Secondary | ICD-10-CM | POA: Diagnosis not present

## 2016-06-21 DIAGNOSIS — H35433 Paving stone degeneration of retina, bilateral: Secondary | ICD-10-CM | POA: Diagnosis not present

## 2016-06-21 DIAGNOSIS — D3131 Benign neoplasm of right choroid: Secondary | ICD-10-CM | POA: Diagnosis not present

## 2016-06-27 DIAGNOSIS — J301 Allergic rhinitis due to pollen: Secondary | ICD-10-CM | POA: Diagnosis not present

## 2016-06-27 DIAGNOSIS — J3089 Other allergic rhinitis: Secondary | ICD-10-CM | POA: Diagnosis not present

## 2016-07-04 DIAGNOSIS — J3089 Other allergic rhinitis: Secondary | ICD-10-CM | POA: Diagnosis not present

## 2016-07-04 DIAGNOSIS — J301 Allergic rhinitis due to pollen: Secondary | ICD-10-CM | POA: Diagnosis not present

## 2016-07-10 DIAGNOSIS — J301 Allergic rhinitis due to pollen: Secondary | ICD-10-CM | POA: Diagnosis not present

## 2016-07-10 DIAGNOSIS — G4733 Obstructive sleep apnea (adult) (pediatric): Secondary | ICD-10-CM | POA: Diagnosis not present

## 2016-07-10 DIAGNOSIS — G4700 Insomnia, unspecified: Secondary | ICD-10-CM | POA: Diagnosis not present

## 2016-07-10 DIAGNOSIS — J3089 Other allergic rhinitis: Secondary | ICD-10-CM | POA: Diagnosis not present

## 2016-07-18 DIAGNOSIS — J301 Allergic rhinitis due to pollen: Secondary | ICD-10-CM | POA: Diagnosis not present

## 2016-07-18 DIAGNOSIS — J3089 Other allergic rhinitis: Secondary | ICD-10-CM | POA: Diagnosis not present

## 2016-07-24 ENCOUNTER — Ambulatory Visit (AMBULATORY_SURGERY_CENTER): Payer: Self-pay | Admitting: *Deleted

## 2016-07-24 VITALS — Ht 71.0 in | Wt 219.0 lb

## 2016-07-24 DIAGNOSIS — Z86010 Personal history of colonic polyps: Secondary | ICD-10-CM

## 2016-07-24 DIAGNOSIS — J301 Allergic rhinitis due to pollen: Secondary | ICD-10-CM | POA: Diagnosis not present

## 2016-07-24 DIAGNOSIS — J3089 Other allergic rhinitis: Secondary | ICD-10-CM | POA: Diagnosis not present

## 2016-07-24 MED ORDER — NA SULFATE-K SULFATE-MG SULF 17.5-3.13-1.6 GM/177ML PO SOLN: ORAL | 0 refills | Status: DC

## 2016-07-24 NOTE — Progress Notes (Signed)
Patient denies any allergies to eggs or soy. Patient denies any problems with anesthesia/sedation. Patient denies any oxygen use at home and does not take any diet/weight loss medications.  

## 2016-07-31 DIAGNOSIS — J3089 Other allergic rhinitis: Secondary | ICD-10-CM | POA: Diagnosis not present

## 2016-07-31 DIAGNOSIS — J301 Allergic rhinitis due to pollen: Secondary | ICD-10-CM | POA: Diagnosis not present

## 2016-08-07 ENCOUNTER — Ambulatory Visit (AMBULATORY_SURGERY_CENTER): Payer: Medicare Other | Admitting: Gastroenterology

## 2016-08-07 ENCOUNTER — Encounter: Payer: Self-pay | Admitting: Gastroenterology

## 2016-08-07 VITALS — BP 120/69 | HR 50 | Temp 97.5°F | Resp 12 | Ht 71.0 in | Wt 219.0 lb

## 2016-08-07 DIAGNOSIS — K219 Gastro-esophageal reflux disease without esophagitis: Secondary | ICD-10-CM | POA: Diagnosis not present

## 2016-08-07 DIAGNOSIS — Z86010 Personal history of colonic polyps: Secondary | ICD-10-CM

## 2016-08-07 DIAGNOSIS — D123 Benign neoplasm of transverse colon: Secondary | ICD-10-CM | POA: Diagnosis not present

## 2016-08-07 DIAGNOSIS — D122 Benign neoplasm of ascending colon: Secondary | ICD-10-CM | POA: Diagnosis not present

## 2016-08-07 MED ORDER — SODIUM CHLORIDE 0.9 % IV SOLN: 500.0000 mL | INTRAVENOUS | Status: DC

## 2016-08-07 NOTE — Op Note (Signed)
[name]: Cory Mathis Procedure Date: 08/07/2016 11:10 AM MRN: [id number] Endoscopist: Ladene Artist , MD Age: 66 Referring MD:  Date of Birth: 06/24/1950 Gender: Male Account #: [id number] Procedure:                Colonoscopy Indications:              Surveillance: Personal history of adenomatous                            polyps on last colonoscopy > 5 years ago Medicines:                Monitored Anesthesia Care Procedure:                Pre-Anesthesia Assessment:                           - Prior to the procedure, a History and Physical                            was performed, and patient medications and                            allergies were reviewed. The patient's tolerance of                            previous anesthesia was also reviewed. The risks                            and benefits of the procedure and the sedation                            options and risks were discussed with the patient.                            All questions were answered, and informed consent                            was obtained. Prior Anticoagulants: The patient has                            taken no previous anticoagulant or antiplatelet                            agents. ASA Grade Assessment: II - A patient with                            mild systemic disease. After reviewing the risks                            and benefits, the patient was deemed in                            satisfactory condition to undergo the procedure.  After obtaining informed consent, the colonoscope                            was passed under direct vision. Throughout the                            procedure, the patient's blood pressure, pulse, and                            oxygen saturations were monitored continuously. The                            Model PCF-H190L [id number]) scope was introduced                            through the anus and  advanced to the the cecum,                            identified by appendiceal orifice and ileocecal                            valve. The ileocecal valve, appendiceal orifice,                            and rectum were photographed. The quality of the                            bowel preparation was good. The colonoscopy was                            performed without difficulty. The patient tolerated                            the procedure well. Scope In: 11:25:31 AM Scope Out: 11:43:06 AM Scope Withdrawal Time: 0 hours 13 minutes 7 seconds  Total Procedure Duration: 0 hours 17 minutes 35 seconds  Findings:                 The perianal and digital rectal examinations were                            normal.                           Three sessile polyps were found in the transverse                            colon (2) and ascending colon (1). The polyps were                            6 to 7 mm in size. These polyps were removed with a                            cold snare. Resection and retrieval were complete.  Internal hemorrhoids were found during                            retroflexion. The hemorrhoids were medium-sized and                            Grade II (internal hemorrhoids that prolapse but                            reduce spontaneously).                           Many medium-mouthed diverticula were found in the                            left colon. There was narrowing of the colon in                            association with the diverticular opening. There                            was evidence of diverticular spasm. Erythema was                            seen in association with the diverticular opening.                            There was no evidence of diverticular bleeding.                           The exam was otherwise without abnormality on                            direct and retroflexion views. Complications:            No  immediate complications. Estimated blood loss:                            None. Estimated Blood Loss:     Estimated blood loss: none. Impression:               - Three 6 to 7 mm polyps in the transverse colon                            and in the ascending colon, removed with a cold                            snare. Resected and retrieved.                           - Internal hemorrhoids.                           - Moderate diverticulosis in the left colon.                           -  The examination was otherwise normal on direct                            and retroflexion views. Recommendation:           - Repeat colonoscopy in 5 years for surveillance.                           - Patient has a contact number available for                            emergencies. The signs and symptoms of potential                            delayed complications were discussed with the                            patient. Return to normal activities tomorrow.                            Written discharge instructions were provided to the                            patient.                           - High fiber diet.                           - Continue present medications.                           - Await pathology results.                           - Referral to office for hemorrhoidal banding. Ladene Artist, MD 08/07/2016 11:48:25 AM This report has been signed electronically.

## 2016-08-07 NOTE — Progress Notes (Signed)
Called to room to assist during endoscopic procedure.  Patient ID and intended procedure confirmed with present staff. Received instructions for my participation in the procedure from the performing physician.  

## 2016-08-07 NOTE — Patient Instructions (Signed)
Impression/Recommendations:  Polyp handout given to patient. Diverticulosis handout given to patient. Hemorrhoid handout given to patient. High fiber diet handout given to patient.  Refer to office for hemorrhoid banding.  Repeat colonoscopy in 5 years for surveillance.  YOU HAD AN ENDOSCOPIC PROCEDURE TODAY AT [facility]:   Refer to the procedure report that was given to you for any specific questions about what was found during the examination.  If the procedure report does not answer your questions, please call your gastroenterologist to clarify.  If you requested that your care partner not be given the details of your procedure findings, then the procedure report has been included in a sealed envelope for you to review at your convenience later.  YOU SHOULD EXPECT: Some feelings of bloating in the abdomen. Passage of more gas than usual.  Walking can help get rid of the air that was put into your GI tract during the procedure and reduce the bloating. If you had a lower endoscopy (such as a colonoscopy or flexible sigmoidoscopy) you may notice spotting of blood in your stool or on the toilet paper. If you underwent a bowel prep for your procedure, you may not have a normal bowel movement for a few days.  Please Note:  You might notice some irritation and congestion in your nose or some drainage.  This is from the oxygen used during your procedure.  There is no need for concern and it should clear up in a day or so.  SYMPTOMS TO REPORT IMMEDIATELY:   Following lower endoscopy (colonoscopy or flexible sigmoidoscopy):  Excessive amounts of blood in the stool  Significant tenderness or worsening of abdominal pains  Swelling of the abdomen that is new, acute  Fever of 100F or higher  For urgent or emergent issues, a gastroenterologist can be reached at any hour by calling [phone].   DIET:  We do recommend a small meal at first, but then you may proceed to your  regular diet.  Drink plenty of fluids but you should avoid alcoholic beverages for 24 hours.  ACTIVITY:  You should plan to take it easy for the rest of today and you should NOT DRIVE or use heavy machinery until tomorrow (because of the sedation medicines used during the test).    FOLLOW UP: Our staff will call the number listed on your records the next business day following your procedure to check on you and address any questions or concerns that you may have regarding the information given to you following your procedure. If we do not reach you, we will leave a message.  However, if you are feeling well and you are not experiencing any problems, there is no need to return our call.  We will assume that you have returned to your regular daily activities without incident.  If any biopsies were taken you will be contacted by phone or by letter within the next 1-3 weeks.  Please call us at [phone] if you have not heard about the biopsies in 3 weeks.    SIGNATURES/CONFIDENTIALITY: You and/or your care partner have signed paperwork which will be entered into your electronic medical record.  These signatures attest to the fact that that the information above on your After Visit Summary has been reviewed and is understood.  Full responsibility of the confidentiality of this discharge information lies with you and/or your care-partner.

## 2016-08-07 NOTE — Progress Notes (Signed)
Report to PACU, RN, vss, BBS= Clear.  

## 2016-08-07 NOTE — Progress Notes (Signed)
Pt's states no medical or surgical changes since previsit or office visit. maw 

## 2016-08-08 ENCOUNTER — Telehealth: Payer: Self-pay

## 2016-08-08 NOTE — Telephone Encounter (Signed)
  Follow up Call-  Call back number 08/07/2016  Post procedure Call Back phone  # [phone] hm  Permission to leave phone message Yes  Some recent data might be hidden     Patient questions:  Do you have a fever, pain , or abdominal swelling? No. Pain Score  0 *  Have you tolerated food without any problems? Yes.    Have you been able to return to your normal activities? Yes.    Do you have any questions about your discharge instructions: Diet   No. Medications  No. Follow up visit  No.  Do you have questions or concerns about your Care? No.  Actions: * If pain score is 4 or above: No action needed, pain <4.

## 2016-08-11 DIAGNOSIS — J301 Allergic rhinitis due to pollen: Secondary | ICD-10-CM | POA: Diagnosis not present

## 2016-08-11 DIAGNOSIS — J3089 Other allergic rhinitis: Secondary | ICD-10-CM | POA: Diagnosis not present

## 2016-08-15 DIAGNOSIS — K219 Gastro-esophageal reflux disease without esophagitis: Secondary | ICD-10-CM | POA: Diagnosis not present

## 2016-08-15 DIAGNOSIS — E039 Hypothyroidism, unspecified: Secondary | ICD-10-CM | POA: Diagnosis not present

## 2016-08-15 DIAGNOSIS — G4700 Insomnia, unspecified: Secondary | ICD-10-CM | POA: Diagnosis not present

## 2016-08-15 DIAGNOSIS — Z0000 Encounter for general adult medical examination without abnormal findings: Secondary | ICD-10-CM | POA: Diagnosis not present

## 2016-08-15 DIAGNOSIS — Z125 Encounter for screening for malignant neoplasm of prostate: Secondary | ICD-10-CM | POA: Diagnosis not present

## 2016-08-15 DIAGNOSIS — N400 Enlarged prostate without lower urinary tract symptoms: Secondary | ICD-10-CM | POA: Diagnosis not present

## 2016-08-15 DIAGNOSIS — R5383 Other fatigue: Secondary | ICD-10-CM | POA: Diagnosis not present

## 2016-08-15 DIAGNOSIS — G4730 Sleep apnea, unspecified: Secondary | ICD-10-CM | POA: Diagnosis not present

## 2016-08-16 DIAGNOSIS — J3089 Other allergic rhinitis: Secondary | ICD-10-CM | POA: Diagnosis not present

## 2016-08-16 DIAGNOSIS — J301 Allergic rhinitis due to pollen: Secondary | ICD-10-CM | POA: Diagnosis not present

## 2016-08-18 DIAGNOSIS — J3089 Other allergic rhinitis: Secondary | ICD-10-CM | POA: Diagnosis not present

## 2016-08-18 DIAGNOSIS — J301 Allergic rhinitis due to pollen: Secondary | ICD-10-CM | POA: Diagnosis not present

## 2016-08-19 IMAGING — US US THYROID
1 series · 12 of 25 positions shown · non-contrast
Comparison: [DATE], [DATE]

CLINICAL DATA: 67-year-old male with a history of left-sided
thyroidectomy

EXAM:
THYROID ULTRASOUND
TECHNIQUE: Ultrasound examination of the thyroid gland and adjacent soft
tissues was performed.

[Series 1: us thyroid · 0.04mm/px · 12 of 60 slices shown]
[im 3/60]
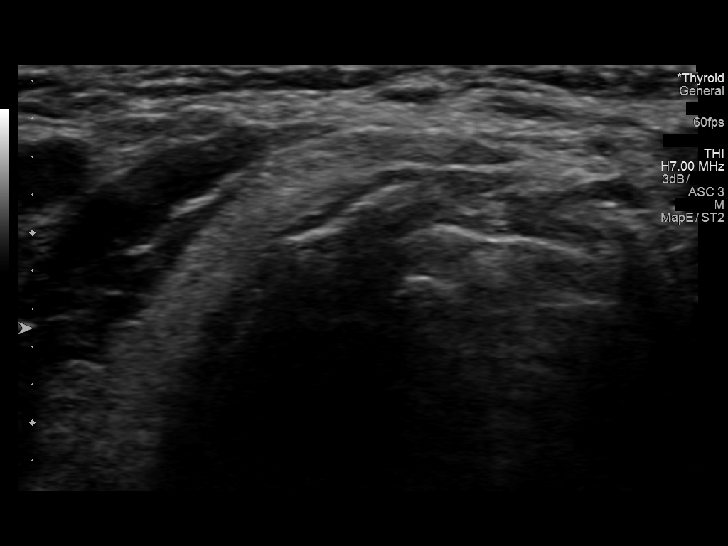
[im 8/60]
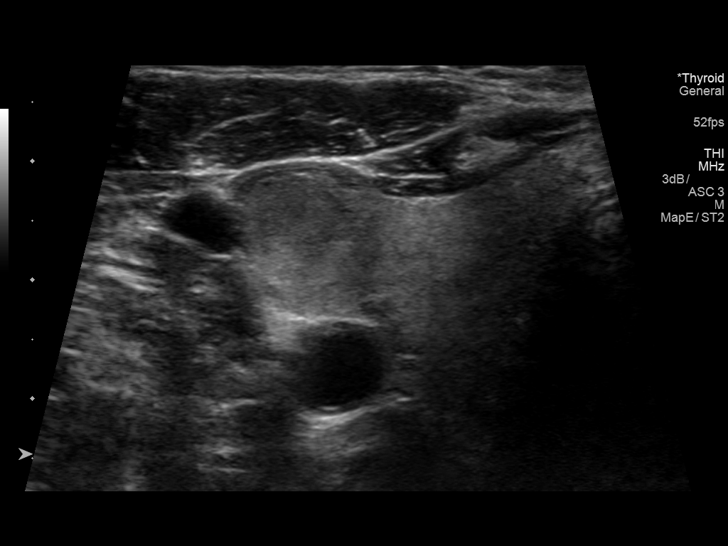
[im 13/60]
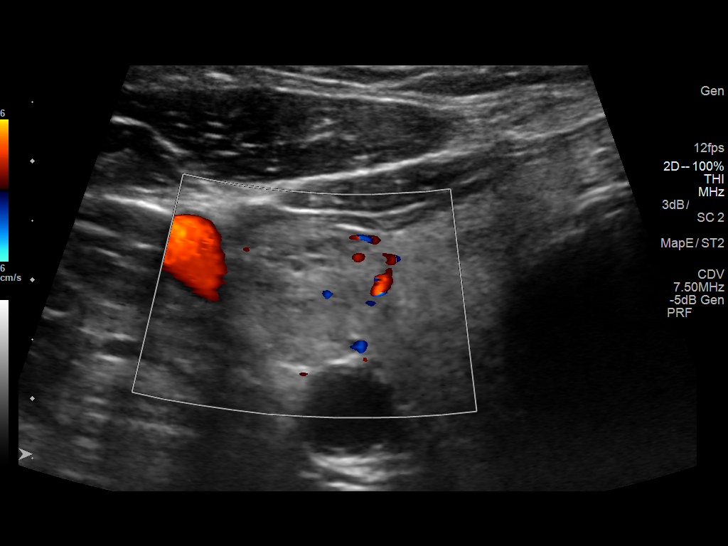
[im 18/60]
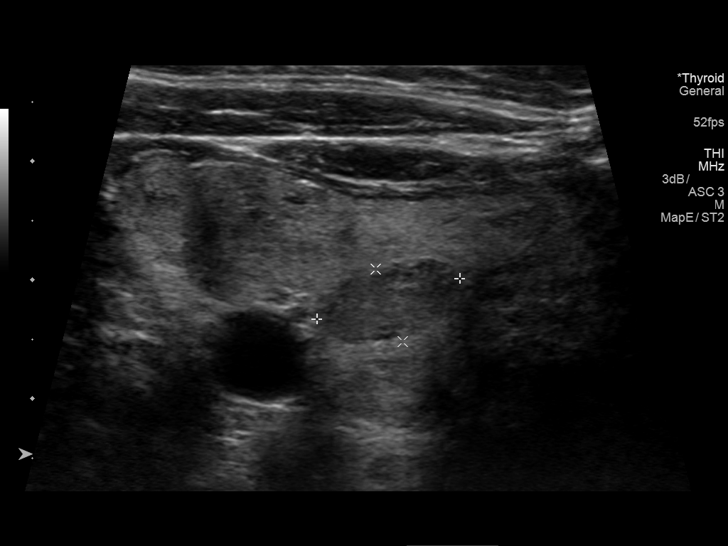
[im 23/60]
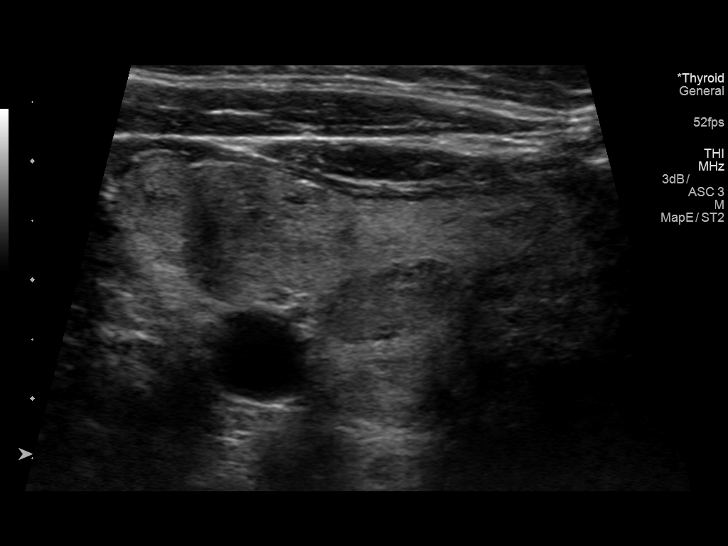
[im 28/60]
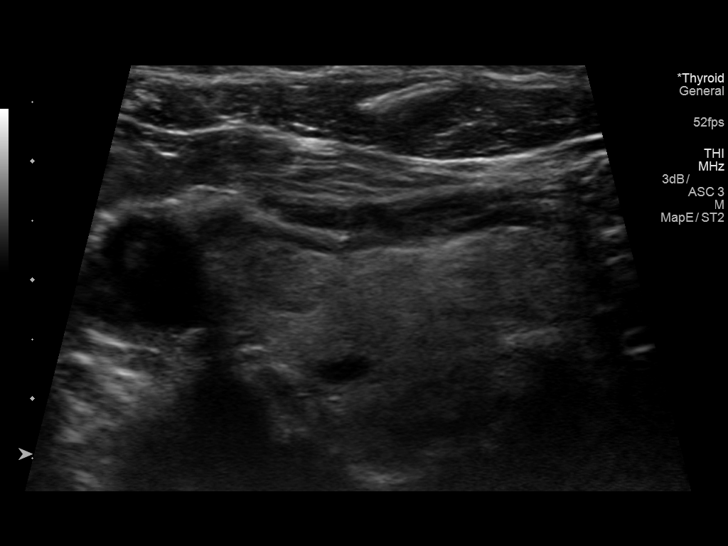
[im 32/60]
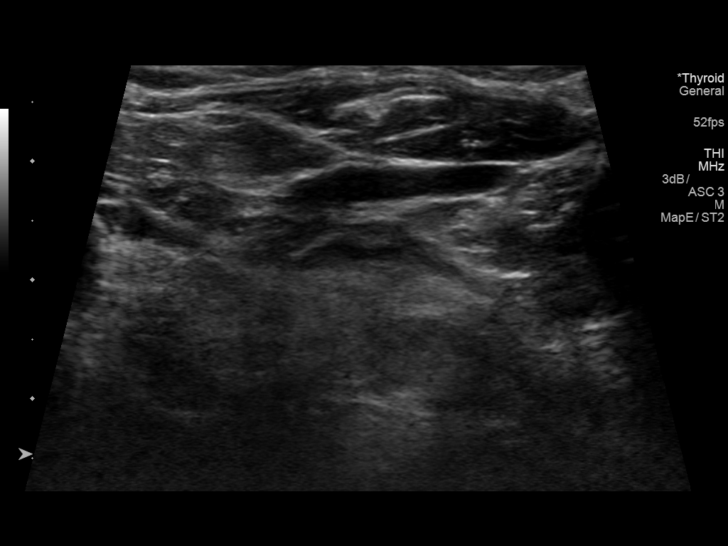
[im 37/60]
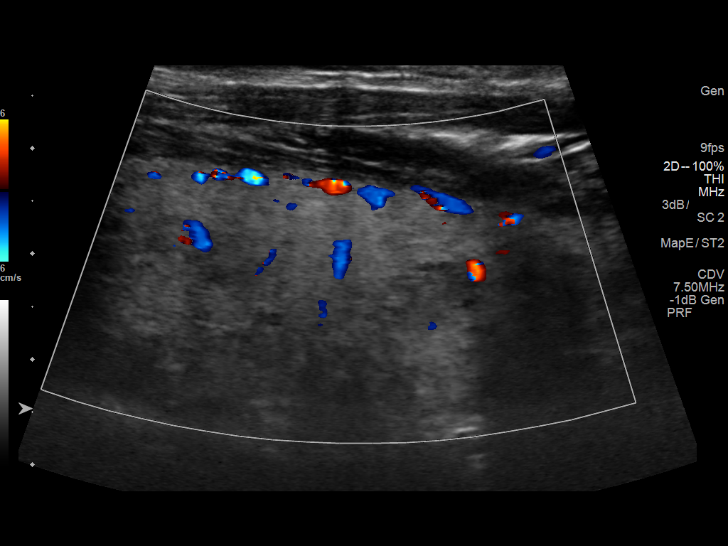
[im 42/60]
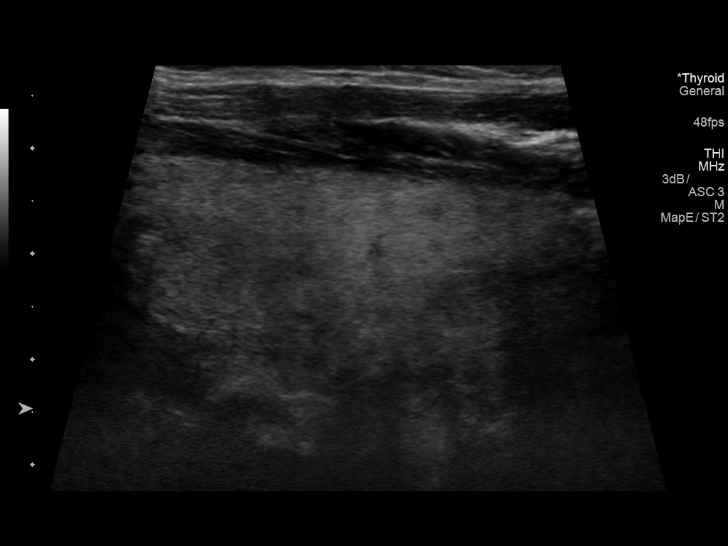
[im 47/60]
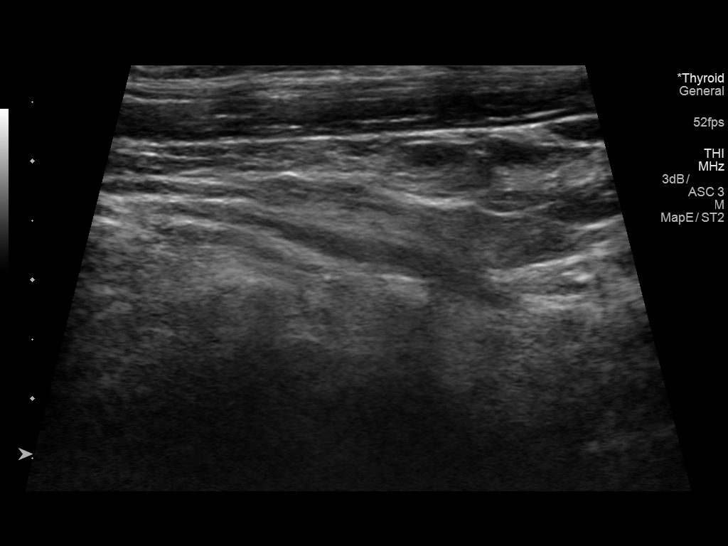
[im 52/60]
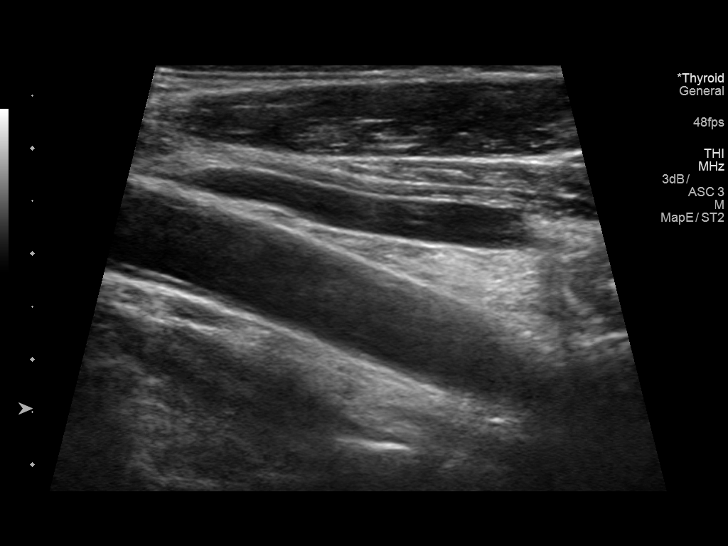
[im 57/60]
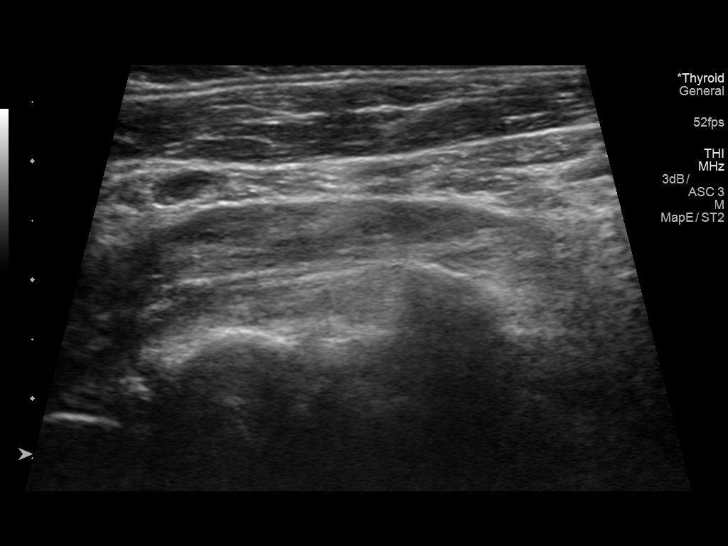

[12 of 25 positions shown; findings below may reference images not displayed]

FINDINGS: Parenchymal Echotexture: Moderately heterogenous

Isthmus: 0.2 cm

Right lobe: 5.8 cm x 2.2 cm x 2.8 cm

Left lobe: Left thyroidectomy

_________________________________________________________

Estimated total number of nodules >/= 1 cm: 5

Number of spongiform nodules >/=  2 cm not described below (TR1): 0

Number of mixed cystic and solid nodules >/= 1.5 cm not described
below (TR2): 0

_________________________________________________________

Nodule # 1:

Location: Right; Superior

Maximum size: 1.9 cm; Other 2 dimensions: 1.2 cm x 1.2 cm

Composition: solid/almost completely solid (2)

Echogenicity: isoechoic (1)

Shape: not taller-than-wide (0)

Margins: smooth (0)

Echogenic foci: none (0)

ACR TI-RADS total points: 3.

ACR TI-RADS risk category: TR3 (3 points).

ACR TI-RADS recommendations:

Nodule meets criteria for surveillance

_________________________________________________________

Nodule # 2:

Location: Right; Mid

Maximum size: 1.6 cm; Other 2 dimensions: 1.5 cm x 1.1 cm

Composition: solid/almost completely solid (2)

Echogenicity: isoechoic (1)

Shape: not taller-than-wide (0)

Margins: ill-defined (0)

Echogenic foci: none (0)

ACR TI-RADS total points: 3.

ACR TI-RADS risk category: TR3 (3 points).

ACR TI-RADS recommendations:

Nodule meets criteria for surveillance

_________________________________________________________

Nodule # 3:

Location: Right; Mid

Maximum size: 1.4 cm; Other 2 dimensions: 1.3 cm x 0.7 cm

Composition: cannot determine (2)

Echogenicity: hypoechoic (2)

Shape: not taller-than-wide (0)

Margins:

Echogenic foci: none (0)

ACR TI-RADS total points: 4.

ACR TI-RADS risk category: TR4 (4-6 points).

ACR TI-RADS recommendations:

Nodule meets criteria for surveillance

_________________________________________________________

Nodule # 4:

Location: Right; Inferior

Maximum size: 1.0 cm; Other 2 dimensions: 0.9 cm x 0.6 cm

Composition: cannot determine (2)

Echogenicity: hypoechoic (2)

Shape: not taller-than-wide (0)

Margins: ill-defined (0)

Echogenic foci: none (0)

ACR TI-RADS total points: 4.

ACR TI-RADS risk category: TR4 (4-6 points).

ACR TI-RADS recommendations:

Nodule meets criteria for surveillance

_________________________________________________________

Nodule # 5:

Location: Right; Inferior

Maximum size: 1.1 cm; Other 2 dimensions: 1.0 cm x 0.7 cm

Composition: cannot determine (2)

Echogenicity: hypoechoic (2)

Shape: not taller-than-wide (0)

Margins: ill-defined (0)

Echogenic foci: none (0)

ACR TI-RADS total points: 4.

ACR TI-RADS risk category: TR4 (4-6 points).

ACR TI-RADS recommendations:

Nodule meets criteria for surveillance

_________________________________________________________

No adenopathy
IMPRESSION: Right-sided thyroid nodules (labeled 1-5) meet criteria for
surveillance, as designated by the newly established ACR TI-RADS
criteria. Surveillance ultrasound study recommended to be performed
annually up to 5 years.

Recommendations follow those established by the new ACR TI-RADS
criteria ([HOSPITAL] [DX];[DATE]).

## 2016-08-23 DIAGNOSIS — J3089 Other allergic rhinitis: Secondary | ICD-10-CM | POA: Diagnosis not present

## 2016-08-23 DIAGNOSIS — N410 Acute prostatitis: Secondary | ICD-10-CM | POA: Diagnosis not present

## 2016-08-23 DIAGNOSIS — N419 Inflammatory disease of prostate, unspecified: Secondary | ICD-10-CM | POA: Diagnosis not present

## 2016-08-23 DIAGNOSIS — J301 Allergic rhinitis due to pollen: Secondary | ICD-10-CM | POA: Diagnosis not present

## 2016-08-24 ENCOUNTER — Encounter: Payer: Self-pay | Admitting: Gastroenterology

## 2016-08-24 DIAGNOSIS — N3281 Overactive bladder: Secondary | ICD-10-CM | POA: Diagnosis not present

## 2016-08-24 DIAGNOSIS — N401 Enlarged prostate with lower urinary tract symptoms: Secondary | ICD-10-CM | POA: Diagnosis not present

## 2016-08-24 DIAGNOSIS — N3000 Acute cystitis without hematuria: Secondary | ICD-10-CM | POA: Diagnosis not present

## 2016-08-28 DIAGNOSIS — J3089 Other allergic rhinitis: Secondary | ICD-10-CM | POA: Diagnosis not present

## 2016-08-28 DIAGNOSIS — J301 Allergic rhinitis due to pollen: Secondary | ICD-10-CM | POA: Diagnosis not present

## 2016-09-06 DIAGNOSIS — J3089 Other allergic rhinitis: Secondary | ICD-10-CM | POA: Diagnosis not present

## 2016-09-06 DIAGNOSIS — J301 Allergic rhinitis due to pollen: Secondary | ICD-10-CM | POA: Diagnosis not present

## 2016-09-07 DIAGNOSIS — J301 Allergic rhinitis due to pollen: Secondary | ICD-10-CM | POA: Diagnosis not present

## 2016-09-07 DIAGNOSIS — H1045 Other chronic allergic conjunctivitis: Secondary | ICD-10-CM | POA: Diagnosis not present

## 2016-09-07 DIAGNOSIS — J3089 Other allergic rhinitis: Secondary | ICD-10-CM | POA: Diagnosis not present

## 2016-09-11 ENCOUNTER — Encounter: Payer: Self-pay | Admitting: Internal Medicine

## 2016-09-11 ENCOUNTER — Ambulatory Visit (INDEPENDENT_AMBULATORY_CARE_PROVIDER_SITE_OTHER): Payer: Medicare Other | Admitting: Internal Medicine

## 2016-09-11 ENCOUNTER — Encounter (INDEPENDENT_AMBULATORY_CARE_PROVIDER_SITE_OTHER): Payer: Self-pay

## 2016-09-11 VITALS — BP 142/72 | Wt 224.5 lb

## 2016-09-11 DIAGNOSIS — K642 Third degree hemorrhoids: Secondary | ICD-10-CM

## 2016-09-11 NOTE — Progress Notes (Signed)
   Symptoms are prolapsing hemorrhoids some rectal bleeding  Rectal exam reveals grade 3 right posterior internal hemorrhoid manually reduced. There is some anal skin tags. The anoderm is otherwise normal.  Anoscopy demonstrates grade 3 right posterior grade 2 right anterior and left lateral internal hemorrhoids.  PROCEDURE NOTE: The patient presents with symptomatic grade 2-3  hemorrhoids, requesting rubber band ligation of his/her hemorrhoidal disease.  All risks, benefits and alternative forms of therapy were described and informed consent was obtained.   The anorectum was pre-medicated with 0.125% nitroglycerin and 5% lidocaine topically The decision was made to band the right posterior internal hemorrhoid, and the [place] was used to perform band ligation without complication.  Digital anorectal examination was then performed to assure proper positioning of the band, and to adjust the banded tissue as required.  The patient was discharged home without pain or other issues.  Dietary and behavioral recommendations were given and along with follow-up instructions.     The following adjunctive treatments were recommended:  Benefiber 1 tablespoon daily  The patient will return  September 29 2016 for  follow-up and possible additional banding as required. No complications were encountered and the patient tolerated the procedure well.  I appreciate the opportunity to care for this patient. CC: Gerrit Heck, MD Dr. Lucio Edward

## 2016-09-11 NOTE — Patient Instructions (Signed)
HEMORRHOID BANDING PROCEDURE    FOLLOW-UP CARE   1. The procedure you have had should have been relatively painless since the banding of the area involved does not have nerve endings and there is no pain sensation.  The rubber band cuts off the blood supply to the hemorrhoid and the band may fall off as soon as 48 hours after the banding (the band may occasionally be seen in the toilet bowl following a bowel movement). You may notice a temporary feeling of fullness in the rectum which should respond adequately to plain Tylenol or Motrin.  2. Following the banding, avoid strenuous exercise that evening and resume full activity the next day.  A sitz bath (soaking in a warm tub) or bidet is soothing, and can be useful for cleansing the area after bowel movements.     3. To avoid constipation, take two tablespoons of natural wheat bran, natural oat bran, flax, Benefiber or any over the counter fiber supplement and increase your water intake to 7-8 glasses daily.    4. Unless you have been prescribed anorectal medication, do not put anything inside your rectum for two weeks: No suppositories, enemas, fingers, etc.  5. Occasionally, you may have more bleeding than usual after the banding procedure.  This is often from the untreated hemorrhoids rather than the treated one.  Don't be concerned if there is a tablespoon or so of blood.  If there is more blood than this, lie flat with your bottom higher than your head and apply an ice pack to the area. If the bleeding does not stop within a half an hour or if you feel faint, call our office at [phone] or go to the emergency room.  6. Problems are not common; however, if there is a substantial amount of bleeding, severe pain, chills, fever or difficulty passing urine (very rare) or other problems, you should call us at [phone] or report to the nearest emergency room.  7. Do not stay seated continuously for more than 2-3 hours for a day or two  after the procedure.  Tighten your buttock muscles 10-15 times every two hours and take 10-15 deep breaths every 1-2 hours.  Do not spend more than a few minutes on the toilet if you cannot empty your bowel; instead re-visit the toilet at a later time.    Please take 1 tablespoon of benefiber daily. Handout provided.    I appreciate the opportunity to care for you. Silvano Rusk, MD, [facility]

## 2016-09-12 DIAGNOSIS — J301 Allergic rhinitis due to pollen: Secondary | ICD-10-CM | POA: Diagnosis not present

## 2016-09-12 DIAGNOSIS — J3089 Other allergic rhinitis: Secondary | ICD-10-CM | POA: Diagnosis not present

## 2016-09-13 ENCOUNTER — Encounter: Payer: Self-pay | Admitting: Internal Medicine

## 2016-09-13 DIAGNOSIS — K642 Third degree hemorrhoids: Secondary | ICD-10-CM | POA: Insufficient documentation

## 2016-09-13 NOTE — Assessment & Plan Note (Addendum)
Right posterior banded today return April 20 reassess repeat banding Daily Benefiber during banding interval.

## 2016-09-19 DIAGNOSIS — J301 Allergic rhinitis due to pollen: Secondary | ICD-10-CM | POA: Diagnosis not present

## 2016-09-19 DIAGNOSIS — J3089 Other allergic rhinitis: Secondary | ICD-10-CM | POA: Diagnosis not present

## 2016-09-22 DIAGNOSIS — J3089 Other allergic rhinitis: Secondary | ICD-10-CM | POA: Diagnosis not present

## 2016-09-25 DIAGNOSIS — J301 Allergic rhinitis due to pollen: Secondary | ICD-10-CM | POA: Diagnosis not present

## 2016-09-25 DIAGNOSIS — J3089 Other allergic rhinitis: Secondary | ICD-10-CM | POA: Diagnosis not present

## 2016-09-29 ENCOUNTER — Encounter: Payer: Medicare Other | Admitting: Internal Medicine

## 2016-10-02 DIAGNOSIS — J3089 Other allergic rhinitis: Secondary | ICD-10-CM | POA: Diagnosis not present

## 2016-10-02 DIAGNOSIS — J301 Allergic rhinitis due to pollen: Secondary | ICD-10-CM | POA: Diagnosis not present

## 2016-10-06 DIAGNOSIS — J301 Allergic rhinitis due to pollen: Secondary | ICD-10-CM | POA: Diagnosis not present

## 2016-10-06 DIAGNOSIS — J3089 Other allergic rhinitis: Secondary | ICD-10-CM | POA: Diagnosis not present

## 2016-10-09 DIAGNOSIS — J3089 Other allergic rhinitis: Secondary | ICD-10-CM | POA: Diagnosis not present

## 2016-10-09 DIAGNOSIS — J301 Allergic rhinitis due to pollen: Secondary | ICD-10-CM | POA: Diagnosis not present

## 2016-10-13 DIAGNOSIS — J3089 Other allergic rhinitis: Secondary | ICD-10-CM | POA: Diagnosis not present

## 2016-10-13 DIAGNOSIS — J301 Allergic rhinitis due to pollen: Secondary | ICD-10-CM | POA: Diagnosis not present

## 2016-10-16 DIAGNOSIS — J301 Allergic rhinitis due to pollen: Secondary | ICD-10-CM | POA: Diagnosis not present

## 2016-10-16 DIAGNOSIS — J3089 Other allergic rhinitis: Secondary | ICD-10-CM | POA: Diagnosis not present

## 2016-10-19 DIAGNOSIS — J301 Allergic rhinitis due to pollen: Secondary | ICD-10-CM | POA: Diagnosis not present

## 2016-10-19 DIAGNOSIS — J3089 Other allergic rhinitis: Secondary | ICD-10-CM | POA: Diagnosis not present

## 2016-10-20 ENCOUNTER — Ambulatory Visit (INDEPENDENT_AMBULATORY_CARE_PROVIDER_SITE_OTHER): Payer: Medicare Other | Admitting: Internal Medicine

## 2016-10-20 ENCOUNTER — Encounter: Payer: Self-pay | Admitting: Internal Medicine

## 2016-10-20 DIAGNOSIS — R972 Elevated prostate specific antigen [PSA]: Secondary | ICD-10-CM | POA: Diagnosis not present

## 2016-10-20 DIAGNOSIS — K642 Third degree hemorrhoids: Secondary | ICD-10-CM

## 2016-10-20 DIAGNOSIS — N3281 Overactive bladder: Secondary | ICD-10-CM | POA: Diagnosis not present

## 2016-10-20 DIAGNOSIS — N401 Enlarged prostate with lower urinary tract symptoms: Secondary | ICD-10-CM | POA: Diagnosis not present

## 2016-10-20 DIAGNOSIS — R351 Nocturia: Secondary | ICD-10-CM | POA: Diagnosis not present

## 2016-10-20 DIAGNOSIS — K641 Second degree hemorrhoids: Secondary | ICD-10-CM

## 2016-10-20 NOTE — Patient Instructions (Signed)
HEMORRHOID BANDING PROCEDURE    FOLLOW-UP CARE   1. The procedure you have had should have been relatively painless since the banding of the area involved does not have nerve endings and there is no pain sensation.  The rubber band cuts off the blood supply to the hemorrhoid and the band may fall off as soon as 48 hours after the banding (the band may occasionally be seen in the toilet bowl following a bowel movement). You may notice a temporary feeling of fullness in the rectum which should respond adequately to plain Tylenol or Motrin.  2. Following the banding, avoid strenuous exercise that evening and resume full activity the next day.  A sitz bath (soaking in a warm tub) or bidet is soothing, and can be useful for cleansing the area after bowel movements.     3. To avoid constipation, take two tablespoons of natural wheat bran, natural oat bran, flax, Benefiber or any over the counter fiber supplement and increase your water intake to 7-8 glasses daily.    4. Unless you have been prescribed anorectal medication, do not put anything inside your rectum for two weeks: No suppositories, enemas, fingers, etc.  5. Occasionally, you may have more bleeding than usual after the banding procedure.  This is often from the untreated hemorrhoids rather than the treated one.  Don't be concerned if there is a tablespoon or so of blood.  If there is more blood than this, lie flat with your bottom higher than your head and apply an ice pack to the area. If the bleeding does not stop within a half an hour or if you feel faint, call our office at [phone] or go to the emergency room.  6. Problems are not common; however, if there is a substantial amount of bleeding, severe pain, chills, fever or difficulty passing urine (very rare) or other problems, you should call us at [phone] or report to the nearest emergency room.  7. Do not stay seated continuously for more than 2-3 hours for a day or two  after the procedure.  Tighten your buttock muscles 10-15 times every two hours and take 10-15 deep breaths every 1-2 hours.  Do not spend more than a few minutes on the toilet if you cannot empty your bowel; instead re-visit the toilet at a later time.    Follow up as needed with Dr Gessner.    I appreciate the opportunity to care for you. Carl Gessner, MD, FACG 

## 2016-10-20 NOTE — Progress Notes (Signed)
   Hemorrhoid banding procedure  Symptoms: Prolapsing hemorrhoids and rectal bleeding, right posterior hemorrhoid banded September 11 2016 - improved  PROCEDURE NOTE: The patient presents with symptomatic grade 2  hemorrhoids, requesting rubber band ligation of his/her hemorrhoidal disease.  All risks, benefits and alternative forms of therapy were described and informed consent was obtained.   The anorectum was pre-medicated with 0.125% NTG  The decision was made to band the RA and LL internal hemorrhoids, and the [place] was used to perform band ligation without complication.  Digital anorectal examination was then performed to assure proper positioning of the band, and to adjust the banded tissue as required.  The patient was discharged home without pain or other issues.  Dietary and behavioral recommendations were given and along with follow-up instructions.      The patient will return prn for  follow-up and possible additional banding as required. No complications were encountered and the patient tolerated the procedure well.  I appreciate the opportunity to care for this patient.  TX:HFSFSE, Benjamine Mola, MD Dr. Lucio Edward

## 2016-10-20 NOTE — Assessment & Plan Note (Signed)
RP and LL banded RTC prn 

## 2016-10-23 DIAGNOSIS — J3089 Other allergic rhinitis: Secondary | ICD-10-CM | POA: Diagnosis not present

## 2016-10-26 IMAGING — US US FNA BIOPSY THYROID 1ST LESION
1 series · 13 of 18 positions shown · non-contrast
Comparison: US Thyroid [DATE]

MEDICATIONS:
5 cc 1% lidocaine

COMPLICATIONS:
None immediate.

INDICATION: Indeterminate thyroid nodule

Right superior thyroid nodule
1.9 cm
EXAM:
ULTRASOUND GUIDED FINE NEEDLE ASPIRATION OF INDETERMINATE THYROID
NODULE
TECHNIQUE: Informed written consent was obtained from the patient after a
discussion of the risks, benefits and alternatives to treatment.
Questions regarding the procedure were encouraged and answered. A
timeout was performed prior to the initiation of the procedure.

[Series 1: us fna biopsy thyroid 1st lesion · 0.06mm/px · 18 acquisitions, 13 frames shown]
[im 1/18]
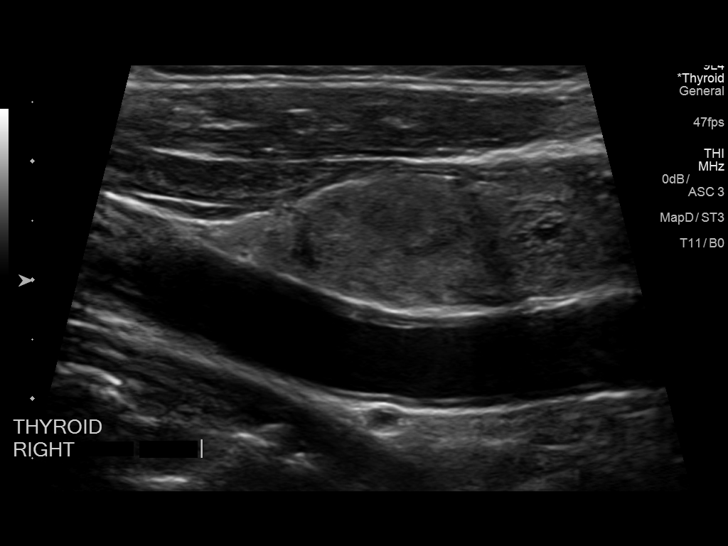
[im 3/18]
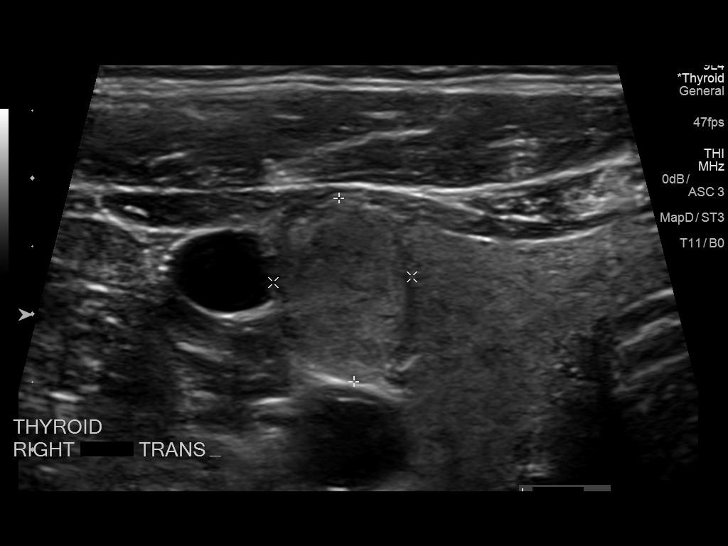
[im 4/18]
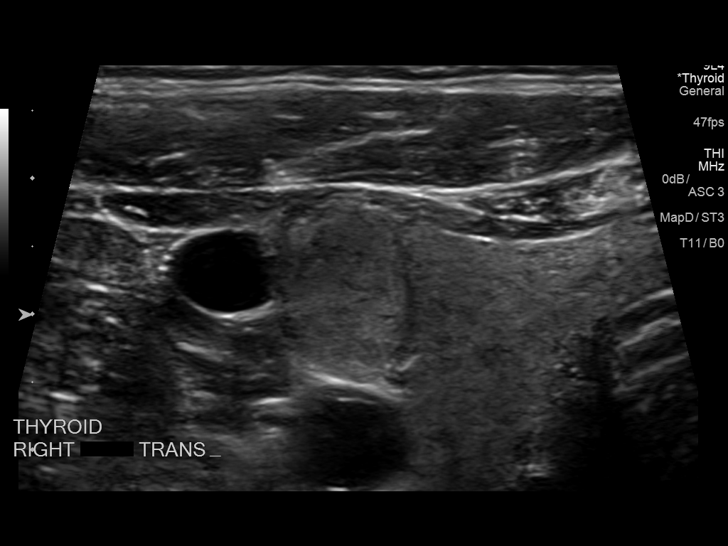
[im 5/18]
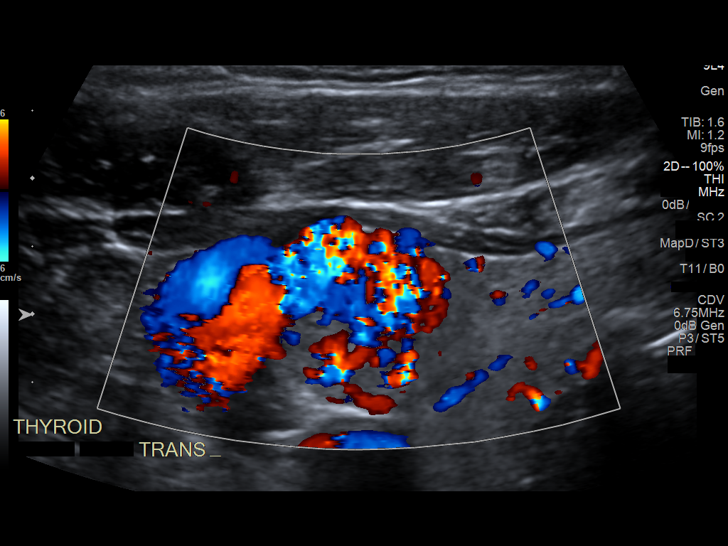
[im 7/18]
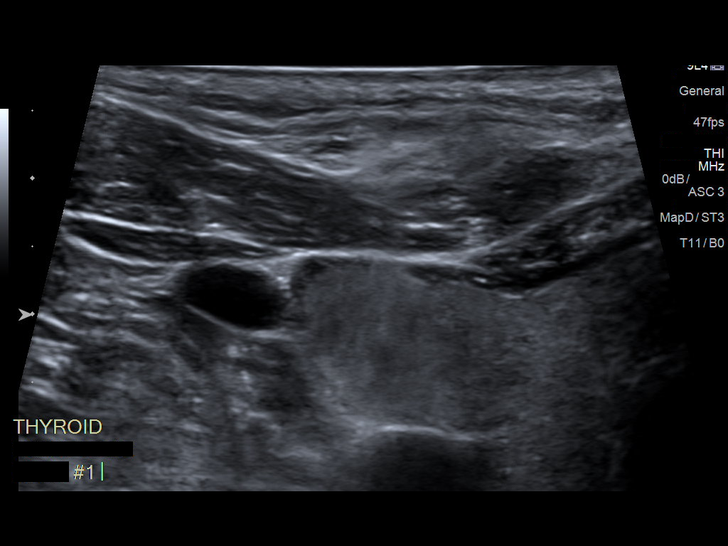
[im 8/18]
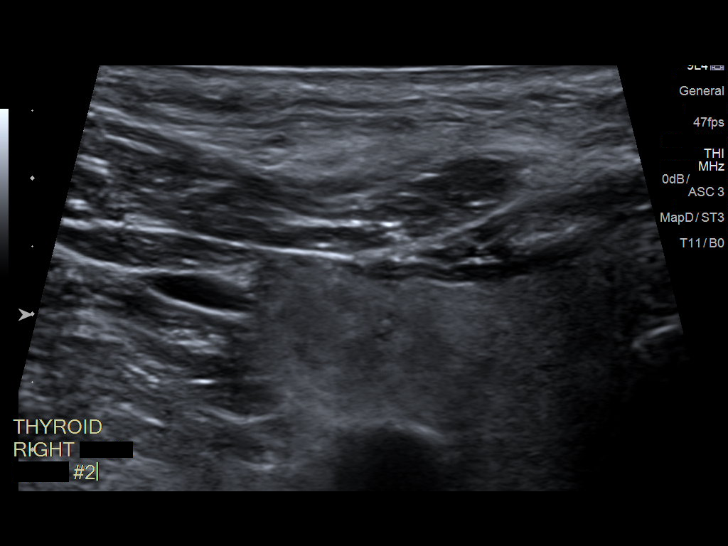
[im 10/18]
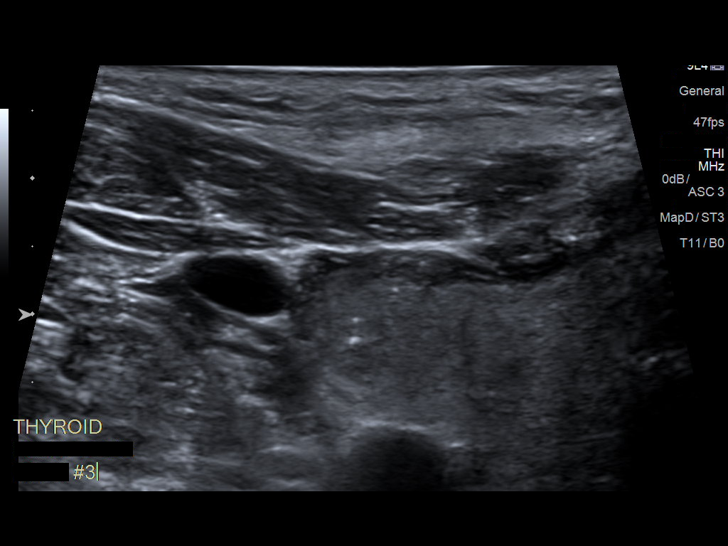
[im 11/18]
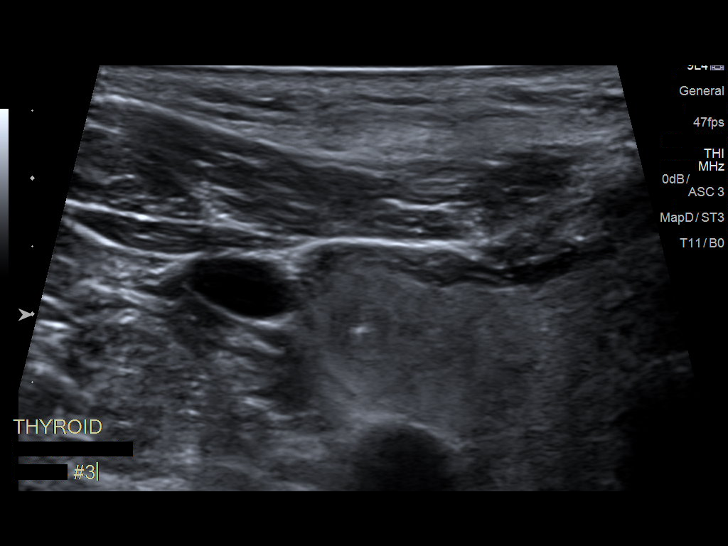
[im 12/18]
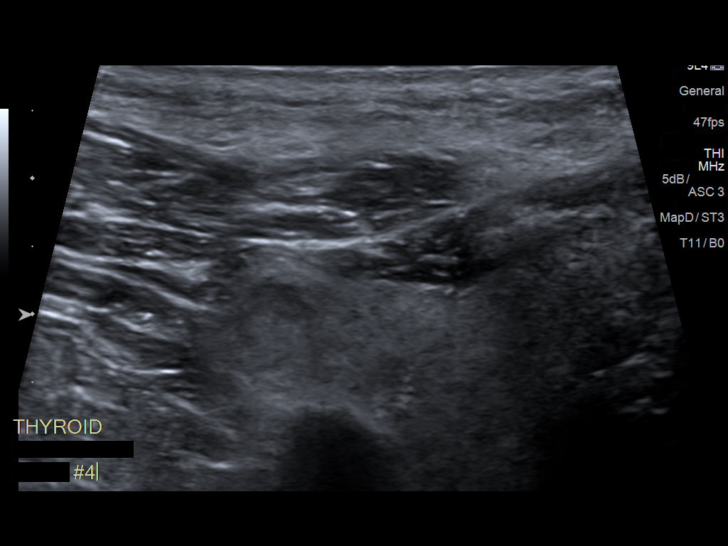
[im 14/18]
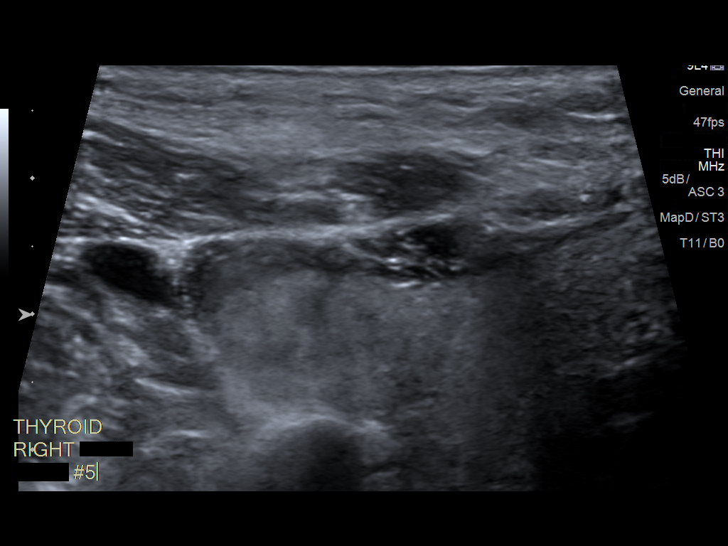
[im 15/18]
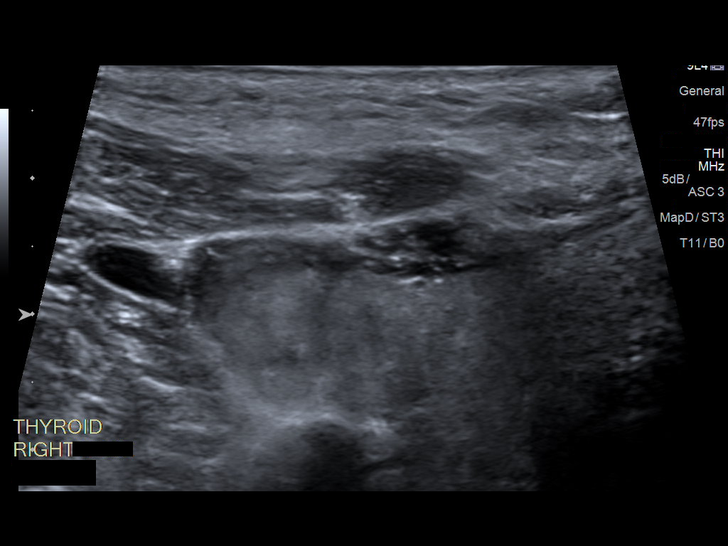
[im 16/18]
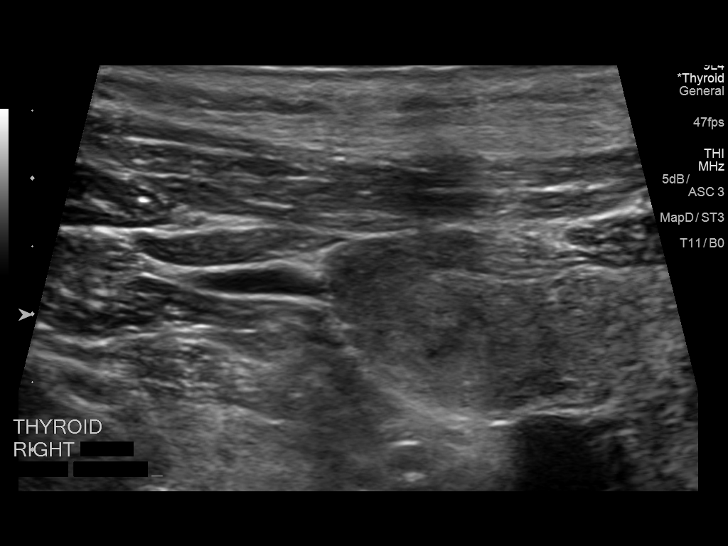
[im 18/18]
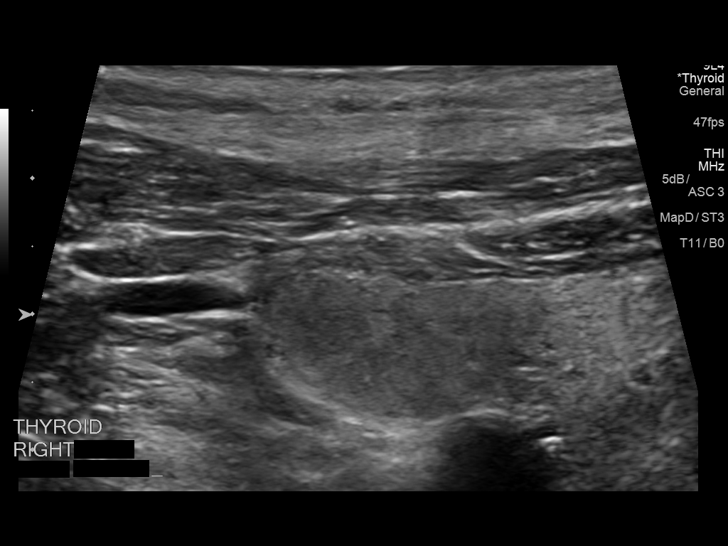

[13 of 18 positions shown; findings below may reference images not displayed]

Pre-procedural ultrasound scanning demonstrated unchanged size and
appearance of the indeterminate nodule within the right thyroid

The procedure was planned. The neck was prepped in the usual sterile
fashion, and a sterile drape was applied covering the operative
field. A timeout was performed prior to the initiation of the
procedure. Local anesthesia was provided with 1% lidocaine.

Under direct ultrasound guidance, 5 FNA biopsies were performed of
the right superior thyroid nodule with a 25 gauge needle.

2 of these samples were obtained for AFIRMA per ordering AGOSTINI

Multiple ultrasound images were saved for procedural documentation
purposes. The samples were prepared and submitted to pathology.

Limited post procedural scanning was negative for hematoma or
additional complication. Dressings were placed. The patient
tolerated the above procedures procedure well without immediate
postprocedural complication.
FINDINGS: Nodule reference number based on prior diagnostic ultrasound: 1

Maximum size: 1.9 cm

Location: Right; Superior

ACR TI-RADS risk category: TR3 (3 points)

Reason for biopsy: meets ACR TI-RADS criteria

Ultrasound imaging confirms appropriate placement of the needles
within the thyroid nodule.
IMPRESSION: Technically successful ultrasound guided fine needle aspiration of
right superior thyroid nodule

Read by

AGOSTINI

## 2016-10-27 DIAGNOSIS — J3089 Other allergic rhinitis: Secondary | ICD-10-CM | POA: Diagnosis not present

## 2016-10-30 DIAGNOSIS — J301 Allergic rhinitis due to pollen: Secondary | ICD-10-CM | POA: Diagnosis not present

## 2016-10-30 DIAGNOSIS — J3089 Other allergic rhinitis: Secondary | ICD-10-CM | POA: Diagnosis not present

## 2016-11-03 DIAGNOSIS — J301 Allergic rhinitis due to pollen: Secondary | ICD-10-CM | POA: Diagnosis not present

## 2016-11-03 DIAGNOSIS — J3089 Other allergic rhinitis: Secondary | ICD-10-CM | POA: Diagnosis not present

## 2016-11-07 DIAGNOSIS — H524 Presbyopia: Secondary | ICD-10-CM | POA: Diagnosis not present

## 2016-11-07 DIAGNOSIS — H04123 Dry eye syndrome of bilateral lacrimal glands: Secondary | ICD-10-CM | POA: Diagnosis not present

## 2016-11-07 DIAGNOSIS — H2513 Age-related nuclear cataract, bilateral: Secondary | ICD-10-CM | POA: Diagnosis not present

## 2016-11-07 DIAGNOSIS — D3131 Benign neoplasm of right choroid: Secondary | ICD-10-CM | POA: Diagnosis not present

## 2016-11-07 DIAGNOSIS — H16223 Keratoconjunctivitis sicca, not specified as Sjogren's, bilateral: Secondary | ICD-10-CM | POA: Diagnosis not present

## 2016-11-08 DIAGNOSIS — J301 Allergic rhinitis due to pollen: Secondary | ICD-10-CM | POA: Diagnosis not present

## 2016-11-08 DIAGNOSIS — J3089 Other allergic rhinitis: Secondary | ICD-10-CM | POA: Diagnosis not present

## 2016-11-10 DIAGNOSIS — E668 Other obesity: Secondary | ICD-10-CM | POA: Diagnosis not present

## 2016-11-10 DIAGNOSIS — Z8249 Family history of ischemic heart disease and other diseases of the circulatory system: Secondary | ICD-10-CM | POA: Diagnosis not present

## 2016-11-10 DIAGNOSIS — R030 Elevated blood-pressure reading, without diagnosis of hypertension: Secondary | ICD-10-CM | POA: Diagnosis not present

## 2016-11-10 DIAGNOSIS — I4510 Unspecified right bundle-branch block: Secondary | ICD-10-CM | POA: Diagnosis not present

## 2016-11-10 DIAGNOSIS — G4733 Obstructive sleep apnea (adult) (pediatric): Secondary | ICD-10-CM | POA: Diagnosis not present

## 2016-11-10 DIAGNOSIS — Z01810 Encounter for preprocedural cardiovascular examination: Secondary | ICD-10-CM | POA: Diagnosis not present

## 2016-11-10 DIAGNOSIS — R079 Chest pain, unspecified: Secondary | ICD-10-CM | POA: Diagnosis not present

## 2016-11-13 ENCOUNTER — Other Ambulatory Visit: Payer: Self-pay | Admitting: Urology

## 2016-11-13 DIAGNOSIS — J3089 Other allergic rhinitis: Secondary | ICD-10-CM | POA: Diagnosis not present

## 2016-11-13 DIAGNOSIS — J301 Allergic rhinitis due to pollen: Secondary | ICD-10-CM | POA: Diagnosis not present

## 2016-11-20 DIAGNOSIS — J301 Allergic rhinitis due to pollen: Secondary | ICD-10-CM | POA: Diagnosis not present

## 2016-11-20 DIAGNOSIS — J3089 Other allergic rhinitis: Secondary | ICD-10-CM | POA: Diagnosis not present

## 2016-11-27 DIAGNOSIS — J301 Allergic rhinitis due to pollen: Secondary | ICD-10-CM | POA: Diagnosis not present

## 2016-11-27 DIAGNOSIS — J3089 Other allergic rhinitis: Secondary | ICD-10-CM | POA: Diagnosis not present

## 2016-12-04 DIAGNOSIS — J3089 Other allergic rhinitis: Secondary | ICD-10-CM | POA: Diagnosis not present

## 2016-12-04 DIAGNOSIS — J301 Allergic rhinitis due to pollen: Secondary | ICD-10-CM | POA: Diagnosis not present

## 2016-12-11 DIAGNOSIS — J3089 Other allergic rhinitis: Secondary | ICD-10-CM | POA: Diagnosis not present

## 2016-12-11 DIAGNOSIS — J301 Allergic rhinitis due to pollen: Secondary | ICD-10-CM | POA: Diagnosis not present

## 2016-12-12 DIAGNOSIS — R351 Nocturia: Secondary | ICD-10-CM | POA: Diagnosis not present

## 2016-12-12 DIAGNOSIS — R3911 Hesitancy of micturition: Secondary | ICD-10-CM | POA: Diagnosis not present

## 2016-12-12 DIAGNOSIS — N401 Enlarged prostate with lower urinary tract symptoms: Secondary | ICD-10-CM | POA: Diagnosis not present

## 2016-12-13 ENCOUNTER — Emergency Department (HOSPITAL_COMMUNITY)
Admission: EM | Admit: 2016-12-13 | Discharge: 2016-12-13 | Disposition: A | Discharge status: Home / Self Care | Payer: Medicare Other | Attending: Emergency Medicine | Admitting: Emergency Medicine

## 2016-12-13 ENCOUNTER — Encounter (HOSPITAL_COMMUNITY): Payer: Self-pay

## 2016-12-13 DIAGNOSIS — R339 Retention of urine, unspecified: Secondary | ICD-10-CM

## 2016-12-13 DIAGNOSIS — N368 Other specified disorders of urethra: Secondary | ICD-10-CM | POA: Diagnosis not present

## 2016-12-13 DIAGNOSIS — R3989 Other symptoms and signs involving the genitourinary system: Secondary | ICD-10-CM

## 2016-12-13 DIAGNOSIS — Z79899 Other long term (current) drug therapy: Secondary | ICD-10-CM | POA: Insufficient documentation

## 2016-12-13 DIAGNOSIS — R338 Other retention of urine: Secondary | ICD-10-CM

## 2016-12-13 HISTORY — DX: Retention of urine, unspecified: R33.9

## 2016-12-13 LAB — URINALYSIS, ROUTINE W REFLEX MICROSCOPIC
BACTERIA UA: NONE SEEN
Bilirubin Urine: NEGATIVE
Glucose, UA: NEGATIVE mg/dL
Ketones, ur: NEGATIVE mg/dL
Leukocytes, UA: NEGATIVE
NITRITE: NEGATIVE
PH: 6 (ref 5.0–8.0)
Protein, ur: NEGATIVE mg/dL
SPECIFIC GRAVITY, URINE: 1.01 (ref 1.005–1.030)
Squamous Epithelial / LPF: NONE SEEN

## 2016-12-13 MED ORDER — IBUPROFEN 800 MG PO TABS
800.0000 mg | ORAL_TABLET | Freq: Once | ORAL | Status: AC
  Administered 2016-12-13: 800 mg via ORAL
  Filled 2016-12-13: qty 1

## 2016-12-13 MED ORDER — PHENAZOPYRIDINE HCL 100 MG PO TABS
100.0000 mg | ORAL_TABLET | Freq: Once | ORAL | Status: AC
  Administered 2016-12-13: 100 mg via ORAL
  Filled 2016-12-13: qty 1

## 2016-12-13 MED ORDER — IBUPROFEN 400 MG PO TABS: 400.0000 mg | ORAL_TABLET | Freq: Three times a day (TID) | ORAL | 0 refills | Status: AC

## 2016-12-13 MED ORDER — PHENAZOPYRIDINE HCL 200 MG PO TABS: 200.0000 mg | ORAL_TABLET | Freq: Three times a day (TID) | ORAL | 0 refills | Status: DC

## 2016-12-13 MED ORDER — LIDOCAINE HCL 2 % EX GEL
1.0000 "application " | Freq: Once | CUTANEOUS | Status: AC
  Administered 2016-12-13: 1 via URETHRAL
  Filled 2016-12-13: qty 11

## 2016-12-13 MED ORDER — TAMSULOSIN HCL 0.4 MG PO CAPS
0.8000 mg | ORAL_CAPSULE | Freq: Once | ORAL | Status: AC
  Administered 2016-12-13: 0.8 mg via ORAL
  Filled 2016-12-13: qty 2

## 2016-12-13 NOTE — ED Provider Notes (Signed)
[facility] DEPT Provider Note   CSN: [id number] Arrival date & time: 12/13/16  7001     History   Chief Complaint Chief Complaint  Patient presents with  . Urinary Retention    HPI Cory Mathis is a 66 y.o. male.  HPI  Patient presents with concern of urinary retention.  Patient has a history of prostate hypertrophy and had digital massage yesterday, during evaluation with his urologist. Approximately 4 hours prior to my evaluation the patient was unable to urinate, after he awoke in the middle of the night. Since that time he has had no urination. He has had increasing lower abdominal discomfort, described as fullness, soreness. No other new complaints including fever, chills, nausea, vomiting, diarrhea. Patient has no history of urinary retention recently.  Patient received Foley catheter in triage, which he notes diminished his lower abdominal discomfort, but contribute it to substantial discomfort around the distal urethra. Patient describes this as a burning sensation.  Past Medical History:  Diagnosis Date  . Adenomatous polyps   . Allergy   . Diverticulitis   . Enlarged prostate   . GERD (gastroesophageal reflux disease)   . Internal hemorrhoids   . Sleep apnea    CPAP    Patient Active Problem List   Diagnosis Date Noted  . Prolapsed internal hemorrhoids, grade 3 09/13/2016  . Dizziness 05/19/2016  . Eustachian tube dysfunction, bilateral 05/19/2016  . Thrush 05/19/2016    Past Surgical History:  Procedure Laterality Date  . COLONOSCOPY    . HEMORRHOID BANDING    . HERNIA REPAIR    . KNEE ARTHROSCOPY    . PILONIDAL CYST EXCISION    . THYROIDECTOMY    . UPPER GASTROINTESTINAL ENDOSCOPY    . WISDOM TOOTH EXTRACTION     3 removed       Home Medications    Prior to Admission medications   Medication Sig Start Date End Date Taking? Authorizing Provider  AMBULATORY NON FORMULARY MEDICATION 1 applicator as directed. Medication Name: allergy  injections    Yes [provider]  co-enzyme Q-10 30 MG capsule Take 30 mg by mouth daily.   Yes [provider]  finasteride (PROSCAR) 5 MG tablet Take 5 mg by mouth daily.   Yes [provider]  pravastatin (PRAVACHOL) 20 MG tablet Take 20 mg by mouth every other day.   Yes [provider]  Probiotic Product (PROBIOTIC DAILY PO) Take 1 capsule by mouth daily.   Yes [provider]  ranitidine (ZANTAC) 150 MG tablet Take 150 mg by mouth daily.   Yes [provider]  tamsulosin (FLOMAX) 0.4 MG CAPS capsule Take 0.4 mg by mouth 2 (two) times daily.    Yes [provider]  zolpidem (AMBIEN CR) 6.25 MG CR tablet Take 6.25 mg by mouth at bedtime as needed for sleep.   Yes [provider]    Family History Family History  Problem Relation Age of Onset  . Prostate cancer Maternal Grandfather   . Colon cancer Neg Hx   . Esophageal cancer Neg Hx   . Pancreatic cancer Neg Hx   . Rectal cancer Neg Hx   . Stomach cancer Neg Hx     Social History Social History  Substance Use Topics  . Smoking status: Never Smoker  . Smokeless tobacco: Never Used  . Alcohol use Yes     Comment: social      Allergies   Sulfa antibiotics; Sulfasalazine; and Flagyl [metronidazole]   Review  of Systems Review of Systems  Constitutional:       Per HPI, otherwise negative  HENT:       Per HPI, otherwise negative  Respiratory:       Per HPI, otherwise negative  Cardiovascular:       Per HPI, otherwise negative  Gastrointestinal: Negative for vomiting.  Endocrine:       Negative aside from HPI  Genitourinary:       Neg aside from HPI   Musculoskeletal:       Per HPI, otherwise negative  Skin: Negative.   Neurological: Negative for syncope.     Physical Exam Updated Vital Signs BP (!) 162/79 (BP Location: Left Arm)   Pulse (!) 57   Temp 97.9 F (36.6 C) (Oral)   Resp 18   Ht 5\' 10"  (1.778 m)   Wt 102.1 kg (225 lb)    SpO2 99%   BMI 32.28 kg/m   Physical Exam  Constitutional: He is oriented to person, place, and time. He appears well-developed. No distress.  HENT:  Head: Normocephalic and atraumatic.  Eyes: Conjunctivae and EOM are normal.  Cardiovascular: Normal rate and regular rhythm.   Pulmonary/Chest: Effort normal. No stridor. No respiratory distress.  Abdominal: He exhibits no distension.  Mild lower abdominal discomfort, no rebound, no guarding  Genitourinary:  Genitourinary Comments: No appreciable external lesion, Foley catheter leaving from the meatus, draining yellow, clear, fluid.   Musculoskeletal: He exhibits no edema.  Neurological: He is alert and oriented to person, place, and time.  Skin: Skin is warm and dry.  Psychiatric: He has a normal mood and affect.  Nursing note and vitals reviewed.    ED Treatments / Results  Labs (all labs ordered are listed, but only abnormal results are displayed) Labs Reviewed  URINALYSIS, ROUTINE W REFLEX MICROSCOPIC - Abnormal; Notable for the following:       Result Value   Color, Urine STRAW (*)    Hgb urine dipstick SMALL (*)    All other components within normal limits  URINE CULTURE   Procedures Procedures (including critical care time)  Medications Ordered in ED Medications  tamsulosin (FLOMAX) capsule 0.8 mg (not administered)  ibuprofen (ADVIL,MOTRIN) tablet 800 mg (not administered)  lidocaine (XYLOCAINE) 2 % jelly 1 application (1 application Urethral Given 12/13/16 0741)     Initial Impression / Assessment and Plan / ED Course  I have reviewed the triage vital signs and the nursing notes.  Pertinent labs & imaging results that were available during my care of the patient were reviewed by me and considered in my medical decision making (see chart for details).  8:51 AM Patient continues to have some discomfort about the distal meatus. We discussed results thus far, and likelihood of ongoing inflammation, injury to his  retention. Patient requests removal of Foley catheter with a trial of urination. Initial urinalysis does not demonstrate infection, urine culture has been sent.   12:49 PM Patient has been able to unable, after requesting removal of foley cathete.  He voices understanding of risk of recurrent urinary retention.  He was d/c w next day uro f/u   Final Clinical Impressions(s) / ED Diagnoses  Urinary retention, acute   Carmin Muskrat, MD 12/13/16 1250

## 2016-12-13 NOTE — ED Notes (Signed)
Pt has foley cath in place from triage

## 2016-12-13 NOTE — Discharge Instructions (Signed)
As discussed, it is important to monitor your condition carefully, and do not hesitate to return for any concerning changes in your condition.  Otherwise, be sure to follow-up with your urologist tomorrow.

## 2016-12-13 NOTE — ED Notes (Signed)
Pt has not been able to urinate, drinking water. States feels better since catheter has been removed.

## 2016-12-13 NOTE — ED Notes (Signed)
Pt continues to c/o discomfort where catheter is inserted.

## 2016-12-13 NOTE — ED Triage Notes (Signed)
Pt has an enlarged prostate but states he's never stopped urinating, he stopped tonight about 3am, upon bladder scanning him he had over 700 ml of urine in his bladder

## 2016-12-14 LAB — URINE CULTURE: Culture: NO GROWTH

## 2016-12-19 DIAGNOSIS — J301 Allergic rhinitis due to pollen: Secondary | ICD-10-CM | POA: Diagnosis not present

## 2016-12-19 DIAGNOSIS — J3089 Other allergic rhinitis: Secondary | ICD-10-CM | POA: Diagnosis not present

## 2016-12-20 DIAGNOSIS — R399 Unspecified symptoms and signs involving the genitourinary system: Secondary | ICD-10-CM | POA: Diagnosis not present

## 2016-12-20 DIAGNOSIS — I10 Essential (primary) hypertension: Secondary | ICD-10-CM | POA: Diagnosis not present

## 2016-12-20 DIAGNOSIS — G4700 Insomnia, unspecified: Secondary | ICD-10-CM | POA: Diagnosis not present

## 2016-12-20 DIAGNOSIS — K5900 Constipation, unspecified: Secondary | ICD-10-CM | POA: Diagnosis not present

## 2016-12-21 ENCOUNTER — Encounter (HOSPITAL_BASED_OUTPATIENT_CLINIC_OR_DEPARTMENT_OTHER): Payer: Self-pay | Admitting: *Deleted

## 2016-12-22 ENCOUNTER — Encounter (HOSPITAL_BASED_OUTPATIENT_CLINIC_OR_DEPARTMENT_OTHER): Payer: Self-pay | Admitting: *Deleted

## 2016-12-22 NOTE — Progress Notes (Signed)
To [facility] at 1100-Hg,Ekg on arrival-Npo after Mn-will bring CPAP mask.

## 2016-12-26 DIAGNOSIS — J3089 Other allergic rhinitis: Secondary | ICD-10-CM | POA: Diagnosis not present

## 2016-12-26 DIAGNOSIS — J301 Allergic rhinitis due to pollen: Secondary | ICD-10-CM | POA: Diagnosis not present

## 2016-12-29 DIAGNOSIS — J3089 Other allergic rhinitis: Secondary | ICD-10-CM | POA: Diagnosis not present

## 2017-01-01 DIAGNOSIS — J301 Allergic rhinitis due to pollen: Secondary | ICD-10-CM | POA: Diagnosis not present

## 2017-01-01 DIAGNOSIS — J3089 Other allergic rhinitis: Secondary | ICD-10-CM | POA: Diagnosis not present

## 2017-01-01 DIAGNOSIS — G4700 Insomnia, unspecified: Secondary | ICD-10-CM | POA: Diagnosis not present

## 2017-01-03 NOTE — Progress Notes (Signed)
Pt left message via phone.  Called pt back via phone and answered questions he had.  Pt will bring cpap machine to be used ower.  Will take am meds w/ zantac and stool softner dos w/ sips of water.  Pt asked about drinking water until when, told pt until 0630 am dos unless taking meds after 0630 can have sips of water.Cory Mathis

## 2017-01-04 ENCOUNTER — Encounter (HOSPITAL_BASED_OUTPATIENT_CLINIC_OR_DEPARTMENT_OTHER): Payer: Self-pay

## 2017-01-04 ENCOUNTER — Observation Stay (HOSPITAL_BASED_OUTPATIENT_CLINIC_OR_DEPARTMENT_OTHER)
Admission: RE | Admit: 2017-01-04 | Discharge: 2017-01-05 | Disposition: A | Discharge status: Home / Self Care | Payer: Medicare Other | Source: Ambulatory Visit | Attending: Urology | Admitting: Urology

## 2017-01-04 ENCOUNTER — Encounter (HOSPITAL_COMMUNITY)
Admission: RE | Disposition: A | Discharge status: Home / Self Care | Payer: Self-pay | Source: Ambulatory Visit | Attending: Urology

## 2017-01-04 ENCOUNTER — Ambulatory Visit (HOSPITAL_BASED_OUTPATIENT_CLINIC_OR_DEPARTMENT_OTHER): Payer: Medicare Other | Admitting: Anesthesiology

## 2017-01-04 DIAGNOSIS — N320 Bladder-neck obstruction: Secondary | ICD-10-CM | POA: Insufficient documentation

## 2017-01-04 DIAGNOSIS — G4733 Obstructive sleep apnea (adult) (pediatric): Secondary | ICD-10-CM | POA: Insufficient documentation

## 2017-01-04 DIAGNOSIS — N138 Other obstructive and reflux uropathy: Secondary | ICD-10-CM | POA: Diagnosis not present

## 2017-01-04 DIAGNOSIS — K219 Gastro-esophageal reflux disease without esophagitis: Secondary | ICD-10-CM | POA: Insufficient documentation

## 2017-01-04 DIAGNOSIS — N401 Enlarged prostate with lower urinary tract symptoms: Principal | ICD-10-CM | POA: Diagnosis present

## 2017-01-04 DIAGNOSIS — N400 Enlarged prostate without lower urinary tract symptoms: Secondary | ICD-10-CM | POA: Diagnosis not present

## 2017-01-04 DIAGNOSIS — K648 Other hemorrhoids: Secondary | ICD-10-CM | POA: Diagnosis not present

## 2017-01-04 HISTORY — DX: Retention of urine, unspecified: R33.9

## 2017-01-04 HISTORY — DX: Diverticulosis of large intestine without perforation or abscess without bleeding: K57.30

## 2017-01-04 HISTORY — DX: Benign prostatic hyperplasia without lower urinary tract symptoms: N40.0

## 2017-01-04 HISTORY — DX: Personal history of other diseases of the digestive system: Z87.19

## 2017-01-04 HISTORY — DX: Personal history of other endocrine, nutritional and metabolic disease: Z86.39

## 2017-01-04 HISTORY — DX: Personal history of colonic polyps: Z86.010

## 2017-01-04 HISTORY — DX: Obstructive sleep apnea (adult) (pediatric): G47.33

## 2017-01-04 HISTORY — DX: Personal history of other infectious and parasitic diseases: Z86.19

## 2017-01-04 HISTORY — DX: Nontoxic single thyroid nodule: E04.1

## 2017-01-04 HISTORY — PX: TRANSURETHRAL RESECTION OF PROSTATE: SHX73

## 2017-01-04 HISTORY — DX: Personal history of adenomatous and serrated colon polyps: Z86.0101

## 2017-01-04 HISTORY — DX: Obstructive sleep apnea (adult) (pediatric): Z99.89

## 2017-01-04 LAB — POCT HEMOGLOBIN-HEMACUE: HEMOGLOBIN: 15.5 g/dL (ref 13.0–17.0)

## 2017-01-04 SURGERY — TURP (TRANSURETHRAL RESECTION OF PROSTATE)
Anesthesia: General | Site: Prostate

## 2017-01-04 MED ORDER — DEXAMETHASONE SODIUM PHOSPHATE 10 MG/ML IJ SOLN
INTRAMUSCULAR | Status: AC
  Filled 2017-01-04: qty 1

## 2017-01-04 MED ORDER — SODIUM CHLORIDE 0.9 % IR SOLN
Status: DC | PRN
  Administered 2017-01-04: 36000 mL via INTRAVESICAL

## 2017-01-04 MED ORDER — ONDANSETRON HCL 4 MG/2ML IJ SOLN
INTRAMUSCULAR | Status: AC
  Filled 2017-01-04: qty 2

## 2017-01-04 MED ORDER — SODIUM CHLORIDE 0.45 % IV SOLN
INTRAVENOUS | Status: DC
  Administered 2017-01-04: 17:00:00 via INTRAVENOUS

## 2017-01-04 MED ORDER — ONDANSETRON HCL 4 MG/2ML IJ SOLN: 4.0000 mg | INTRAMUSCULAR | Status: DC | PRN

## 2017-01-04 MED ORDER — TAMSULOSIN HCL 0.4 MG PO CAPS: 0.4000 mg | ORAL_CAPSULE | Freq: Every day | ORAL | Status: DC

## 2017-01-04 MED ORDER — EPHEDRINE SULFATE-NACL 50-0.9 MG/10ML-% IV SOSY
PREFILLED_SYRINGE | INTRAVENOUS | Status: DC | PRN
  Administered 2017-01-04 (×2): 10 mg via INTRAVENOUS

## 2017-01-04 MED ORDER — FENTANYL CITRATE (PF) 100 MCG/2ML IJ SOLN
INTRAMUSCULAR | Status: DC | PRN
  Administered 2017-01-04: 50 ug via INTRAVENOUS
  Administered 2017-01-04: 25 ug via INTRAVENOUS
  Administered 2017-01-04: 50 ug via INTRAVENOUS
  Administered 2017-01-04: 25 ug via INTRAVENOUS
  Administered 2017-01-04: 50 ug via INTRAVENOUS

## 2017-01-04 MED ORDER — PROMETHAZINE HCL 25 MG/ML IJ SOLN
6.2500 mg | INTRAMUSCULAR | Status: DC | PRN
  Filled 2017-01-04: qty 1

## 2017-01-04 MED ORDER — MIDAZOLAM HCL 5 MG/5ML IJ SOLN
INTRAMUSCULAR | Status: DC | PRN
  Administered 2017-01-04: 2 mg via INTRAVENOUS

## 2017-01-04 MED ORDER — PROPOFOL 10 MG/ML IV BOLUS
INTRAVENOUS | Status: AC
  Filled 2017-01-04: qty 20

## 2017-01-04 MED ORDER — LIDOCAINE 2% (20 MG/ML) 5 ML SYRINGE
INTRAMUSCULAR | Status: DC | PRN
  Administered 2017-01-04: 100 mg via INTRAVENOUS

## 2017-01-04 MED ORDER — FENTANYL CITRATE (PF) 100 MCG/2ML IJ SOLN
INTRAMUSCULAR | Status: AC
  Filled 2017-01-04: qty 2

## 2017-01-04 MED ORDER — ACETAMINOPHEN 325 MG PO TABS
650.0000 mg | ORAL_TABLET | ORAL | Status: DC | PRN
  Filled 2017-01-04: qty 2

## 2017-01-04 MED ORDER — SENNA 8.6 MG PO TABS
1.0000 | ORAL_TABLET | Freq: Two times a day (BID) | ORAL | Status: DC
  Administered 2017-01-04 – 2017-01-05 (×2): 8.6 mg via ORAL
  Filled 2017-01-04 (×2): qty 1

## 2017-01-04 MED ORDER — CEPHALEXIN 500 MG PO CAPS
500.0000 mg | ORAL_CAPSULE | Freq: Two times a day (BID) | ORAL | Status: DC
  Administered 2017-01-04 – 2017-01-05 (×2): 500 mg via ORAL
  Filled 2017-01-04 (×2): qty 1

## 2017-01-04 MED ORDER — LIDOCAINE 2% (20 MG/ML) 5 ML SYRINGE
INTRAMUSCULAR | Status: AC
  Filled 2017-01-04: qty 5

## 2017-01-04 MED ORDER — FENTANYL CITRATE (PF) 100 MCG/2ML IJ SOLN
25.0000 ug | INTRAMUSCULAR | Status: DC | PRN
  Administered 2017-01-04 (×2): 25 ug via INTRAVENOUS
  Administered 2017-01-04: 50 ug via INTRAVENOUS
  Filled 2017-01-04: qty 1

## 2017-01-04 MED ORDER — SODIUM CHLORIDE 0.9 % IR SOLN
3000.0000 mL | Status: DC
  Administered 2017-01-04 – 2017-01-05 (×6): 3000 mL
  Filled 2017-01-04: qty 3000

## 2017-01-04 MED ORDER — PROPOFOL 10 MG/ML IV BOLUS
INTRAVENOUS | Status: DC | PRN
  Administered 2017-01-04: 10 mg via INTRAVENOUS
  Administered 2017-01-04: 250 mg via INTRAVENOUS

## 2017-01-04 MED ORDER — CEFAZOLIN SODIUM-DEXTROSE 2-4 GM/100ML-% IV SOLN
INTRAVENOUS | Status: AC
  Filled 2017-01-04: qty 100

## 2017-01-04 MED ORDER — ZOLPIDEM TARTRATE 5 MG PO TABS
5.0000 mg | ORAL_TABLET | Freq: Every evening | ORAL | Status: DC | PRN
  Administered 2017-01-04: 5 mg via ORAL
  Filled 2017-01-04: qty 1

## 2017-01-04 MED ORDER — KETOROLAC TROMETHAMINE 30 MG/ML IJ SOLN
30.0000 mg | Freq: Once | INTRAMUSCULAR | Status: DC | PRN
  Filled 2017-01-04: qty 1

## 2017-01-04 MED ORDER — CEFAZOLIN SODIUM-DEXTROSE 2-4 GM/100ML-% IV SOLN
2.0000 g | INTRAVENOUS | Status: AC
  Administered 2017-01-04: 2 g via INTRAVENOUS
  Filled 2017-01-04: qty 100

## 2017-01-04 MED ORDER — BELLADONNA ALKALOIDS-OPIUM 16.2-60 MG RE SUPP
1.0000 | Freq: Four times a day (QID) | RECTAL | Status: DC | PRN
  Administered 2017-01-04 – 2017-01-05 (×3): 1 via RECTAL
  Filled 2017-01-04 (×3): qty 1

## 2017-01-04 MED ORDER — OXYCODONE HCL 5 MG PO TABS
5.0000 mg | ORAL_TABLET | ORAL | Status: DC | PRN
  Administered 2017-01-04 – 2017-01-05 (×3): 5 mg via ORAL
  Filled 2017-01-04 (×4): qty 1

## 2017-01-04 MED ORDER — EPHEDRINE 5 MG/ML INJ
INTRAVENOUS | Status: AC
  Filled 2017-01-04: qty 10

## 2017-01-04 MED ORDER — ONDANSETRON HCL 4 MG/2ML IJ SOLN
INTRAMUSCULAR | Status: DC | PRN
  Administered 2017-01-04: 4 mg via INTRAVENOUS

## 2017-01-04 MED ORDER — MIDAZOLAM HCL 2 MG/2ML IJ SOLN
INTRAMUSCULAR | Status: AC
  Filled 2017-01-04: qty 2

## 2017-01-04 MED ORDER — LACTATED RINGERS IV SOLN
INTRAVENOUS | Status: DC
  Administered 2017-01-04 (×2): via INTRAVENOUS
  Filled 2017-01-04: qty 1000

## 2017-01-04 MED ORDER — DEXAMETHASONE SODIUM PHOSPHATE 4 MG/ML IJ SOLN
INTRAMUSCULAR | Status: DC | PRN
  Administered 2017-01-04: 10 mg via INTRAVENOUS

## 2017-01-04 SURGICAL SUPPLY — 31 items
BAG DRAIN URO-CYSTO SKYTR STRL (DRAIN) ×2 IMPLANT
BAG URINE DRAINAGE (UROLOGICAL SUPPLIES) ×2 IMPLANT
BAG URINE LEG 19OZ MD ST LTX (BAG) IMPLANT
CATH FOLEY 2WAY SLVR  5CC 20FR (CATHETERS)
CATH FOLEY 2WAY SLVR  5CC 22FR (CATHETERS)
CATH FOLEY 2WAY SLVR 30CC 22FR (CATHETERS) IMPLANT
CATH FOLEY 2WAY SLVR 5CC 20FR (CATHETERS) IMPLANT
CATH FOLEY 2WAY SLVR 5CC 22FR (CATHETERS) IMPLANT
CATH FOLEY 3WAY 30CC 22F (CATHETERS) IMPLANT
CATH HEMA 3WAY 30CC 22FR COUDE (CATHETERS) ×2 IMPLANT
CATH HEMA 3WAY 30CC 24FR COUDE (CATHETERS) IMPLANT
CATH HEMA 3WAY 30CC 24FR RND (CATHETERS) IMPLANT
CLOTH BEACON ORANGE TIMEOUT ST (SAFETY) ×2 IMPLANT
ELECT BUTTON BIOP 24F 90D PLAS (MISCELLANEOUS) ×2 IMPLANT
ELECT REM PT RETURN 9FT ADLT (ELECTROSURGICAL) ×2
ELECTRODE REM PT RTRN 9FT ADLT (ELECTROSURGICAL) ×1 IMPLANT
EVACUATOR MICROVAS BLADDER (UROLOGICAL SUPPLIES) ×2 IMPLANT
GLOVE BIO SURGEON STRL SZ8 (GLOVE) ×2 IMPLANT
GOWN STRL REUS W/ TWL LRG LVL3 (GOWN DISPOSABLE) ×1 IMPLANT
GOWN STRL REUS W/ TWL XL LVL3 (GOWN DISPOSABLE) ×1 IMPLANT
GOWN STRL REUS W/TWL LRG LVL3 (GOWN DISPOSABLE) ×1
GOWN STRL REUS W/TWL XL LVL3 (GOWN DISPOSABLE) ×1
HOLDER FOLEY CATH W/STRAP (MISCELLANEOUS) IMPLANT
KIT RM TURNOVER CYSTO AR (KITS) ×2 IMPLANT
MANIFOLD NEPTUNE II (INSTRUMENTS) ×2 IMPLANT
PACK CYSTO (CUSTOM PROCEDURE TRAY) ×2 IMPLANT
PLUG CATH AND CAP STER (CATHETERS) IMPLANT
SET ASPIRATION TUBING (TUBING) IMPLANT
SYR 30ML LL (SYRINGE) IMPLANT
SYRINGE IRR TOOMEY STRL 70CC (SYRINGE) IMPLANT
TUBE CONNECTING 12X1/4 (SUCTIONS) ×2 IMPLANT

## 2017-01-04 NOTE — Discharge Instructions (Signed)
Transurethral Resection of the Prostate ° °Care After ° °Refer to this sheet in the next few weeks. These discharge instructions provide you with general information on caring for yourself after you leave the hospital. Your caregiver may also give you specific instructions. Your treatment has been planned according to the most current medical practices available, but unavoidable complications sometimes occur. If you have any problems or questions after discharge, please call your caregiver. ° °HOME CARE INSTRUCTIONS  ° °Medications °· You may receive medicine for pain management. As your level of discomfort decreases, adjustments in your pain medicines may be made.  °· Take all medicines as directed.  °· You may be given a medicine (antibiotic) to kill germs following surgery. Finish all medicines. Let your caregiver know if you have any side effects or problems from the medicine.  °· If you are on aspirin, it would be best not to restart the aspirin until the blood in the urine clears °Hygiene °· You can take a shower after surgery.  °· You should not take a bath while you still have the urethral catheter. °Activity °· You will be encouraged to get out of bed as much as possible and increase your activity level as tolerated.  °· Spend the first week in and around your home. For 3 weeks, avoid the following:  °· Straining.  °· Running.  °· Strenuous work.  °· Walks longer than a few blocks.  °· Riding for extended periods.  °· Sexual relations.  °· Do not lift heavy objects (more than 20 pounds) for at least 1 month. When lifting, use your arms instead of your abdominal muscles.  °· You will be encouraged to walk as tolerated. Do not exert yourself. Increase your activity level slowly. Remember that it is important to keep moving after an operation of any type. This cuts down on the possibility of developing blood clots.  °· Your caregiver will tell you when you can resume driving and light housework. Discuss this  at your first office visit after discharge. °Diet °· No special diet is ordered after a TURP. However, if you are on a special diet for another medical problem, it should be continued.  °· Normal fluid intake is usually recommended.  °· Avoid alcohol and caffeinated drinks for 2 weeks. They irritate the bladder. Decaffeinated drinks are okay.  °· Avoid spicy foods.  °Bladder Function °· For the first 10 days, empty the bladder whenever you feel a definite desire. Do not try to hold the urine for long periods of time.  °· Urinating once or twice a night even after you are healed is not uncommon.  °· You may see some recurrence of blood in the urine after discharge from the hospital. This usually happens within 2 weeks after the procedure.If this occurs, force fluids again as you did in the hospital and reduce your activity.  °Bowel Function °· You may experience some constipation after surgery. This can be minimized by increasing fluids and fiber in your diet. Drink enough water and fluids to keep your urine clear or pale yellow.  °· A stool softener may be prescribed for use at home. Do not strain to move your bowels.  °· If you are requiring increased pain medicine, it is important that you take stool softeners to prevent constipation. This will help to promote proper healing by reducing the need to strain to move your bowels.  °Sexual Activity °· Semen movement in the opposite direction and into the bladder (  retrograde ejaculation) may occur. Since the semen passes into the bladder, cloudy urine can occur the first time you urinate after intercourse. Or, you may not have an ejaculation during erection. Ask your caregiver when you can resume sexual activity. Retrograde ejaculation and reduced semen discharge should not reduce one's pleasure of intercourse.  °Postoperative Visit °· Arrange the date and time of your after surgery visit with your caregiver.  °Return to Work °· After your recovery is complete, you will  be able to return to work and resume all activities. Your caregiver will inform you when you can return to work.  °Foley Catheter Care °A soft, flexible tube (Foley catheter) may have been placed in your bladder to drain urine and fluid. Follow these instructions: °Taking Care of the Catheter °· Keep the area where the catheter leaves your body clean.  °· Attach the catheter to the leg so there is no tension on the catheter.  °· Keep the drainage bag below the level of the bladder, but keep it OFF the floor.  °· Do not take long soaking baths. Your caregiver will give instructions about showering.  °· Wash your hands before touching ANYTHING related to the catheter or bag.  °· Using mild soap and warm water on a washcloth:  °· Clean the area closest to the catheter insertion site using a circular motion around the catheter.  °· Clean the catheter itself by wiping AWAY from the insertion site for several inches down the tube.  °· NEVER wipe upward as this could sweep bacteria up into the urethra (tube in your body that normally drains the bladder) and cause infection.  °· Place a small amount of sterile lubricant at the tip of the penis where the catheter is entering.  °Taking Care of the Drainage Bags °· Two drainage bags may be taken home: a large overnight drainage bag, and a smaller leg bag which fits underneath clothing.  °· It is okay to wear the overnight bag at any time, but NEVER wear the smaller leg bag at night.  °· Keep the drainage bag well below the level of your bladder. This prevents backflow of urine into the bladder and allows the urine to drain freely.  °· Anchor the tubing to your leg to prevent pulling or tension on the catheter. Use tape or a leg strap provided by the hospital.  °· Empty the drainage bag when it is 1/2 to 3/4 full. Wash your hands before and after touching the bag.  °· Periodically check the tubing for kinks to make sure there is no pressure on the tubing which could restrict  the flow of urine.  °Changing the Drainage Bags °· Cleanse both ends of the clean bag with alcohol before changing.  °· Pinch off the rubber catheter to avoid urine spillage during the disconnection.  °· Disconnect the dirty bag and connect the clean one.  °· Empty the dirty bag carefully to avoid a urine spill.  °· Attach the new bag to the leg with tape or a leg strap.  °Cleaning the Drainage Bags °· Whenever a drainage bag is disconnected, it must be cleaned quickly so it is ready for the next use.  °· Wash the bag in warm, soapy water.  °· Rinse the bag thoroughly with warm water.  °· Soak the bag for 30 minutes in a solution of white vinegar and water (1 cup vinegar to 1 quart warm water).  °· Rinse with warm water.  °SEEK MEDICAL   CARE IF:  °· You have chills or night sweats.  °· You are leaking around your catheter or have problems with your catheter. It is not uncommon to have sporadic leakage around your catheter as a result of bladder spasms. If the leakage stops, there is not much need for concern. If you are uncertain, call your caregiver.  °· You develop side effects that you think are coming from your medicines.  °SEEK IMMEDIATE MEDICAL CARE IF:  °· You are suddenly unable to urinate. Check to see if there are any kinks in the drainage tubing that may cause this. If you cannot find any kinks, call your caregiver immediately. This is an emergency.  °· You develop shortness of breath or chest pains.  °· Bleeding persists or clots develop in your urine.  °· You have a fever.  °· You develop pain in your back or over your lower belly (abdomen).  °· You develop pain or swelling in your legs.  °· Any problems you are having get worse rather than better.  °MAKE SURE YOU:  °· Understand these instructions.  °· Will watch your condition.  °· Will get help right away if you are not doing well or get worse.  °Document Released: 05/29/2005 Document Revised: 02/08/2011 Document Reviewed: 01/20/2009 °[employer]®  Patient Information ©2012 [employer].Transurethral Resection of the Prostate °Care After °Refer to this sheet in the next few weeks. These discharge instructions provide you with general information on caring for yourself after you leave the hospital. Your caregiver may also give you specific instructions. Your treatment has been planned according to the most current medical practices available, but unavoidable complications sometimes occur. If you have any problems or questions after discharge, please call your caregiver. °

## 2017-01-04 NOTE — Interval H&P Note (Signed)
History and Physical Interval Note:  01/04/2017 12:42 PM  Cory Mathis  has presented today for surgery, with the diagnosis of BENIGN PROSTATIC HYPERPLASIA  The various methods of treatment have been discussed with the patient and family. After consideration of risks, benefits and other options for treatment, the patient has consented to  Procedure(s): TRANSURETHRAL RESECTION OF THE PROSTATE (TURP) (N/A) as a surgical intervention .  The patient's history has been reviewed, patient examined, no change in status, stable for surgery.  I have reviewed the patient's chart and labs.  Questions were answered to the patient's satisfaction.     [name]

## 2017-01-04 NOTE — Transfer of Care (Signed)
  Last Vitals:  Vitals:   01/04/17 1105 01/04/17 1442  BP: (!) 167/79 (!) 154/70  Pulse: 62 79  Resp: 16 10  Temp: 36.9 C (!) 36.4 C    Last Pain:  Vitals:   01/04/17 1105  TempSrc: Oral      Patients Stated Pain Goal: 5 (01/04/17 1143)  Immediate Anesthesia Transfer of Care Note  Patient: Cory Mathis  Procedure(s) Performed: Procedure(s) (LRB): TRANSURETHRAL RESECTION OF THE PROSTATE (TURP) (N/A)  Patient Location: PACU  Anesthesia Type: General  Level of Consciousness: awake, alert  and oriented  Airway & Oxygen Therapy: Patient Spontanous Breathing and Patient connected to nasal cannula oxygen  Post-op Assessment: Report given to PACU RN and Post -op Vital signs reviewed and stable  Post vital signs: Reviewed and stable  Complications: No apparent anesthesia complications

## 2017-01-04 NOTE — H&P (Signed)
H&P  Chief Complaint: Large prostate  History of Present Illness: 66 year old male presents for TURP. He has significant LUTS and a gland that measures 120 ml. Despite medical therapy, he still has significant sx's and presents now for TURP.  Past Medical History:  Diagnosis Date  . BPH (benign prostatic hyperplasia)   . Diverticulosis of colon   . GERD (gastroesophageal reflux disease)    prn med. control  . History of adenomatous polyp of colon    03-29-2007  tubular adenoma/  08-07-2016  hyperplastic and tubular adenoma's  . History of alcoholic gastritis    [phone]  gastritis, esophagitis, peptic duodenitis  . History of colonic diverticulitis    01-31-2016  resolved w/ medications  . History of Helicobacter pylori infection    10-30-2008  . 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 Internal hemorrhoids   . OSA on CPAP    CPAP nightly  . Right thyroid nodule   . Urinary retention 12/13/2016    Past Surgical History:  Procedure Laterality Date  . COLONOSCOPY  last one 08-07-2016  . HEMORRHOID BANDING  10-20-2016;  09-11-2016  . HERNIA REPAIR    . KNEE ARTHROSCOPY Bilateral    10 + yrs ago  . PILONIDAL CYST EXCISION    . THYROIDECTOMY Left 2013  . UPPER GASTROINTESTINAL ENDOSCOPY  10/30/2008  . WISDOM TOOTH EXTRACTION      Home Medications:  Allergies as of 01/04/2017      Reactions   Sulfa Antibiotics Hives   Sulfasalazine Hives   Flagyl [metronidazole] Other (See Comments)   Pt reported he was light headed for 2 weeks.      Medication List    Notice   Cannot display discharge medications because the patient has not yet been admitted.     Allergies:  Allergies  Allergen Reactions  . Sulfa Antibiotics Hives  . Sulfasalazine Hives  . Flagyl [Metronidazole] Other (See Comments)    Pt reported he was light headed for 2 weeks.    Family History  Problem Relation Age of Onset  . Prostate cancer  Maternal Grandfather   . Colon cancer Neg Hx   . Esophageal cancer Neg Hx   . Pancreatic cancer Neg Hx   . Rectal cancer Neg Hx   . Stomach cancer Neg Hx     Social History:  reports that he has never smoked. He has never used smokeless tobacco. He reports that he drinks alcohol. He reports that he does not use drugs.  ROS: A complete review of systems was performed.  All systems are negative except for pertinent findings as noted.  Physical Exam:  Vital signs in last 24 hours:   Constitutional:  Alert and oriented, No acute distress Cardiovascular: Regular rate and rhythm, No JVD Respiratory: Normal respiratory effort, Lungs clear bilaterally GI: Abdomen is soft, nontender, nondistended, no abdominal masses Genitourinary: No CVAT. Normal male phallus, testes are descended bilaterally and non-tender and without masses, scrotum is normal in appearance without lesions or masses, perineum is normal on inspection. Rectal: Normal sphincter tone, no rectal masses, prostate is non tender and without nodularity. Prostate size is estimated to be >100 cc Lymphatic: No lymphadenopathy Neurologic: Grossly intact, no focal deficits Psychiatric: Normal mood and affect  Laboratory Data:  No results for input(s): WBC, HGB, HCT, PLT in the last 72 hours.  No results for input(s): NA, K, CL, GLUCOSE, BUN, CALCIUM, CREATININE in the  last 72 hours.  Invalid input(s): CO3   No results found for this or any previous visit (from the past 24 hour(s)). No results found for this or any previous visit (from the past 240 hour(s)).  Renal Function: No results for input(s): CREATININE in the last 168 hours. CrCl cannot be calculated (Patient's most recent lab result is older than the maximum 21 days allowed.).  Radiologic Imaging: No results found.  Impression/Assessment:  BPH  Plan:  TURP

## 2017-01-04 NOTE — Anesthesia Preprocedure Evaluation (Signed)
Anesthesia Evaluation  Patient identified by MRN, date of birth, ID band Patient awake    Reviewed: Allergy & Precautions, NPO status , Patient's Chart, lab work & pertinent test results  Airway Mallampati: II  TM Distance: >3 FB Neck ROM: Full    Dental no notable dental hx.    Pulmonary sleep apnea and Continuous Positive Airway Pressure Ventilation ,    Pulmonary exam normal breath sounds clear to auscultation       Cardiovascular negative cardio ROS Normal cardiovascular exam Rhythm:Regular Rate:Normal     Neuro/Psych negative neurological ROS  negative psych ROS   GI/Hepatic negative GI ROS, Neg liver ROS,   Endo/Other  negative endocrine ROS  Renal/GU negative Renal ROS  negative genitourinary   Musculoskeletal negative musculoskeletal ROS (+)   Abdominal   Peds negative pediatric ROS (+)  Hematology negative hematology ROS (+)   Anesthesia Other Findings   Reproductive/Obstetrics negative OB ROS                             Anesthesia Physical Anesthesia Plan  ASA: II  Anesthesia Plan: General   Post-op Pain Management:    Induction: Intravenous  PONV Risk Score and Plan: 2 and Ondansetron and Dexamethasone  Airway Management Planned: LMA  Additional Equipment:   Intra-op Plan:   Post-operative Plan: Extubation in OR  Informed Consent: I have reviewed the patients History and Physical, chart, labs and discussed the procedure including the risks, benefits and alternatives for the proposed anesthesia with the patient or authorized representative who has indicated his/her understanding and acceptance.   Dental advisory given  Plan Discussed with: CRNA and Surgeon  Anesthesia Plan Comments:         Anesthesia Quick Evaluation

## 2017-01-04 NOTE — Anesthesia Procedure Notes (Signed)
Procedure Name: LMA Insertion Date/Time: 01/04/2017 12:58 PM Performed by: Mechele Claude Pre-anesthesia Checklist: Patient identified, Emergency Drugs available, Suction available and Patient being monitored Patient Re-evaluated:Patient Re-evaluated prior to induction Oxygen Delivery Method: Circle system utilized Preoxygenation: Pre-oxygenation with 100% oxygen Induction Type: IV induction Ventilation: Mask ventilation without difficulty LMA: LMA inserted LMA Size: 5.0 Number of attempts: 1 Airway Equipment and Method: Bite block Placement Confirmation: positive ETCO2 Tube secured with: Tape Dental Injury: Teeth and Oropharynx as per pre-operative assessment

## 2017-01-04 NOTE — Progress Notes (Signed)
Patient has home equipment. Patient will self administer when ready.

## 2017-01-04 NOTE — Op Note (Signed)
Preoperative diagnosis: 1. Bladder outlet obstruction secondary to BPH  Postoperative diagnosis:  1. Bladder outlet obstruction secondary to BPH  Procedure:  1. Cystoscopy 2. Transurethral resection of the prostate  Surgeon: Lillette Boxer. Barbra Miner, M.D.  Anesthesia: General  Complications: None  Drain: 22 Fr hematuria catheter  EBL: Minimal  Specimens: 1. Prostate chips  Disposition of specimens: Pathology  Indication: Cory Mathis is a patient with bladder outlet obstruction secondary to benign prostatic hyperplasia. After reviewing the management options for treatment, he elected to proceed with the above surgical procedure(s). We have discussed the potential benefits and risks of the procedure, side effects of the proposed treatment, the likelihood of the patient achieving the goals of the procedure, and any potential problems that might occur during the procedure or recuperation. Informed consent has been obtained.  Description of procedure:  The patient was identified in the holding area.He received preoperative antibiotics. He was then taken to the operating room. General anesthetic was administered.  The patient was then placed in the dorsal lithotomy position, prepped and draped in the usual sterile fashion. Timeout was then performed.  A resectoscope sheath was placed using the obturator, and the resectoscope, loop and telescope were placed.  The bladder was then systematically examined in its entirety. There was no evidence of  tumors, stones, or other mucosal pathology.  The ureteral orifices were identified and marked so as to be avoided during the procedure.  The prostate adenoma was then resected utilizing loop cautery resection with the bipolar cutting loop.  The prostate adenoma from the bladder neck back to the verumontanum was resected beginning at the six o'clock position and then extended to include the right and left lobes of the prostate and anterior  prostate, respectively. Care was taken not to resect distal to the verumontanum.  Hemostasis was then achieved with the cautery and the bladder was emptied and reinspected with no significant bleeding noted at the end of the procedure.  Resected chips were irrigated from the bladder with the evacuator and sent to pathology.  A 3 way catheter was then placed into the bladder and placed on continuous bladder irrigation.  The patient appeared to tolerate the procedure well and without complications. The patient was able to be awakened and transferred to the recovery unit in satisfactory condition. He tolerated the procedure well.

## 2017-01-05 ENCOUNTER — Encounter (HOSPITAL_BASED_OUTPATIENT_CLINIC_OR_DEPARTMENT_OTHER): Payer: Self-pay | Admitting: Urology

## 2017-01-05 DIAGNOSIS — G4733 Obstructive sleep apnea (adult) (pediatric): Secondary | ICD-10-CM | POA: Diagnosis not present

## 2017-01-05 DIAGNOSIS — N401 Enlarged prostate with lower urinary tract symptoms: Secondary | ICD-10-CM | POA: Diagnosis not present

## 2017-01-05 DIAGNOSIS — N320 Bladder-neck obstruction: Secondary | ICD-10-CM | POA: Diagnosis not present

## 2017-01-05 DIAGNOSIS — K219 Gastro-esophageal reflux disease without esophagitis: Secondary | ICD-10-CM | POA: Diagnosis not present

## 2017-01-05 DIAGNOSIS — N138 Other obstructive and reflux uropathy: Secondary | ICD-10-CM | POA: Diagnosis not present

## 2017-01-05 NOTE — Progress Notes (Signed)
The CBI was stopped and the foley catheter was removed. Patient was given a urinal,and educated on the string of  3 bottles

## 2017-01-08 NOTE — Anesthesia Postprocedure Evaluation (Signed)
Anesthesia Post Note  Patient: Cory Mathis  Procedure(s) Performed: Procedure(s) (LRB): TRANSURETHRAL RESECTION OF THE PROSTATE (TURP) (N/A)     Patient location during evaluation: PACU Anesthesia Type: General Level of consciousness: awake and alert Pain management: pain level controlled Vital Signs Assessment: post-procedure vital signs reviewed and stable Respiratory status: spontaneous breathing, nonlabored ventilation, respiratory function stable and patient connected to nasal cannula oxygen Cardiovascular status: blood pressure returned to baseline and stable Postop Assessment: no signs of nausea or vomiting Anesthetic complications: no    Last Vitals:  Vitals:   01/05/17 0150 01/05/17 0412  BP: (!) 122/59 132/69  Pulse: (!) 58 75  Resp: 16 18  Temp: 36.6 C 36.9 C    Last Pain:  Vitals:   01/08/17 1009  TempSrc:   PainSc: 3                  Iretha Kirley S

## 2017-01-09 DIAGNOSIS — N401 Enlarged prostate with lower urinary tract symptoms: Secondary | ICD-10-CM | POA: Diagnosis not present

## 2017-01-09 DIAGNOSIS — R202 Paresthesia of skin: Secondary | ICD-10-CM | POA: Diagnosis not present

## 2017-01-09 DIAGNOSIS — H6590 Unspecified nonsuppurative otitis media, unspecified ear: Secondary | ICD-10-CM | POA: Diagnosis not present

## 2017-01-09 DIAGNOSIS — R030 Elevated blood-pressure reading, without diagnosis of hypertension: Secondary | ICD-10-CM | POA: Diagnosis not present

## 2017-01-09 DIAGNOSIS — R310 Gross hematuria: Secondary | ICD-10-CM | POA: Diagnosis not present

## 2017-01-09 DIAGNOSIS — Z79899 Other long term (current) drug therapy: Secondary | ICD-10-CM | POA: Diagnosis not present

## 2017-01-09 DIAGNOSIS — Z9079 Acquired absence of other genital organ(s): Secondary | ICD-10-CM | POA: Diagnosis not present

## 2017-01-09 DIAGNOSIS — R351 Nocturia: Secondary | ICD-10-CM | POA: Diagnosis not present

## 2017-01-15 ENCOUNTER — Other Ambulatory Visit: Payer: Self-pay | Admitting: Otolaryngology

## 2017-01-17 DIAGNOSIS — J3089 Other allergic rhinitis: Secondary | ICD-10-CM | POA: Diagnosis not present

## 2017-01-17 DIAGNOSIS — J301 Allergic rhinitis due to pollen: Secondary | ICD-10-CM | POA: Diagnosis not present

## 2017-01-22 DIAGNOSIS — J3089 Other allergic rhinitis: Secondary | ICD-10-CM | POA: Diagnosis not present

## 2017-01-22 DIAGNOSIS — J301 Allergic rhinitis due to pollen: Secondary | ICD-10-CM | POA: Diagnosis not present

## 2017-01-29 DIAGNOSIS — J301 Allergic rhinitis due to pollen: Secondary | ICD-10-CM | POA: Diagnosis not present

## 2017-01-29 DIAGNOSIS — J3089 Other allergic rhinitis: Secondary | ICD-10-CM | POA: Diagnosis not present

## 2017-01-29 NOTE — Discharge Summary (Signed)
Patient ID: Cory Mathis MRN: [id number] DOB/AGE: 07-05-1950 65 y.o.  Admit date: 01/04/2017 Discharge date: 01/29/2017  Primary Care Physician:  [name], MD  Discharge Diagnoses:   Present on Admission: . Enlarged prostate with urinary obstruction   Consults:  None   Discharge Medications: Allergies as of 01/05/2017      Reactions   Sulfa Antibiotics Hives   Sulfasalazine Hives   Flagyl [metronidazole] Other (See Comments)   Pt reported he was light headed for 2 weeks.      Medication List    STOP taking these medications   finasteride 5 MG tablet Commonly known as:  PROSCAR   phenazopyridine 200 MG tablet Commonly known as:  PYRIDIUM   pravastatin 20 MG tablet Commonly known as:  PRAVACHOL   tamsulosin 0.4 MG Caps capsule Commonly known as:  FLOMAX     TAKE these medications   AMBULATORY NON FORMULARY MEDICATION 1 applicator as directed. Medication Name: allergy injections   bisacodyl 5 MG EC tablet Commonly known as:  DULCOLAX Take 5 mg by mouth as needed for moderate constipation.   cephALEXin 500 MG capsule Commonly known as:  KEFLEX Take 500 mg by mouth every 12 (twelve) hours. ABT Start Date 12/30/16 & End Date 01/06/17   PROBIOTIC DAILY PO Take 1 capsule by mouth daily.   ranitidine 150 MG tablet Commonly known as:  ZANTAC Take 150 mg by mouth daily.   zolpidem 6.25 MG CR tablet Commonly known as:  AMBIEN CR Take 6.25 mg by mouth at bedtime.        Significant Diagnostic Studies:  No results found.  Brief H and P: For complete details please refer to admission H and P, but in brief Cory Mathis is admitted for TURP for mgmt of obstructive BPH  Hospital Course:  Active Problems:   Enlarged prostate with urinary obstruction Cory Ra had an uncomplicated postoperative course and was discharged on POD 1  Day of Discharge BP 132/69 (BP Location: Right Arm)   Pulse 75   Temp 98.4 F (36.9 C) (Oral)   Resp 18   Ht 5' 10.5"  (1.791 m)   Wt 98.7 kg (217 lb 8 oz)   SpO2 96%   BMI 30.77 kg/m   No results found for this or any previous visit (from the past 24 hour(s)).  Physical Exam: General: Alert and awake oriented x3 not in any acute distress. HEENT: anicteric sclera, pupils reactive to light and accommodation CVS: S1-S2 clear no murmur rubs or gallops Chest: clear to auscultation bilaterally, no wheezing rales or rhonchi Abdomen: soft nontender, nondistended, normal bowel sounds, no organomegaly Extremities: no cyanosis, clubbing or edema noted bilaterally Neuro: Cranial nerves II-XII intact, no focal neurological deficits  Disposition:  Home  Diet:  Regular  Activity:  Gradually increase   Disposition and Follow-up:    Home--followup arranged  TESTS THAT NEED FOLLOW-UP  Path review  DISCHARGE FOLLOW-UP Follow-up Information    Franchot Gallo, MD.   Specialty:  Urology Contact information: [address] [place] [id number] [phone]           Time spent on Discharge:  10 mins  Signed: [name] 01/29/2017, 9:30 PM

## 2017-01-31 DIAGNOSIS — R300 Dysuria: Secondary | ICD-10-CM | POA: Diagnosis not present

## 2017-02-01 ENCOUNTER — Other Ambulatory Visit: Payer: Self-pay | Admitting: Otolaryngology

## 2017-02-01 DIAGNOSIS — E041 Nontoxic single thyroid nodule: Secondary | ICD-10-CM

## 2017-02-05 ENCOUNTER — Ambulatory Visit
Admission: RE | Admit: 2017-02-05 | Discharge: 2017-02-05 | Disposition: A | Discharge status: Home / Self Care | Payer: Medicare Other | Source: Ambulatory Visit | Attending: Otolaryngology | Admitting: Otolaryngology

## 2017-02-05 DIAGNOSIS — E041 Nontoxic single thyroid nodule: Secondary | ICD-10-CM

## 2017-02-05 DIAGNOSIS — J301 Allergic rhinitis due to pollen: Secondary | ICD-10-CM | POA: Diagnosis not present

## 2017-02-05 DIAGNOSIS — E042 Nontoxic multinodular goiter: Secondary | ICD-10-CM | POA: Diagnosis not present

## 2017-02-05 DIAGNOSIS — H04123 Dry eye syndrome of bilateral lacrimal glands: Secondary | ICD-10-CM | POA: Diagnosis not present

## 2017-02-05 DIAGNOSIS — J3089 Other allergic rhinitis: Secondary | ICD-10-CM | POA: Diagnosis not present

## 2017-02-05 DIAGNOSIS — H16223 Keratoconjunctivitis sicca, not specified as Sjogren's, bilateral: Secondary | ICD-10-CM | POA: Diagnosis not present

## 2017-02-09 DIAGNOSIS — R3911 Hesitancy of micturition: Secondary | ICD-10-CM | POA: Diagnosis not present

## 2017-02-09 DIAGNOSIS — N401 Enlarged prostate with lower urinary tract symptoms: Secondary | ICD-10-CM | POA: Diagnosis not present

## 2017-02-13 DIAGNOSIS — M6281 Muscle weakness (generalized): Secondary | ICD-10-CM | POA: Diagnosis not present

## 2017-02-13 DIAGNOSIS — R300 Dysuria: Secondary | ICD-10-CM | POA: Diagnosis not present

## 2017-02-13 DIAGNOSIS — N3946 Mixed incontinence: Secondary | ICD-10-CM | POA: Diagnosis not present

## 2017-02-13 DIAGNOSIS — J3089 Other allergic rhinitis: Secondary | ICD-10-CM | POA: Diagnosis not present

## 2017-02-13 DIAGNOSIS — R278 Other lack of coordination: Secondary | ICD-10-CM | POA: Diagnosis not present

## 2017-02-13 DIAGNOSIS — J301 Allergic rhinitis due to pollen: Secondary | ICD-10-CM | POA: Diagnosis not present

## 2017-02-14 DIAGNOSIS — J3089 Other allergic rhinitis: Secondary | ICD-10-CM | POA: Diagnosis not present

## 2017-02-14 DIAGNOSIS — J301 Allergic rhinitis due to pollen: Secondary | ICD-10-CM | POA: Diagnosis not present

## 2017-02-19 DIAGNOSIS — J3089 Other allergic rhinitis: Secondary | ICD-10-CM | POA: Diagnosis not present

## 2017-02-19 DIAGNOSIS — J301 Allergic rhinitis due to pollen: Secondary | ICD-10-CM | POA: Diagnosis not present

## 2017-02-21 DIAGNOSIS — J3089 Other allergic rhinitis: Secondary | ICD-10-CM | POA: Diagnosis not present

## 2017-02-22 DIAGNOSIS — M62838 Other muscle spasm: Secondary | ICD-10-CM | POA: Diagnosis not present

## 2017-02-22 DIAGNOSIS — N3946 Mixed incontinence: Secondary | ICD-10-CM | POA: Diagnosis not present

## 2017-02-22 DIAGNOSIS — M6281 Muscle weakness (generalized): Secondary | ICD-10-CM | POA: Diagnosis not present

## 2017-02-22 DIAGNOSIS — Z23 Encounter for immunization: Secondary | ICD-10-CM | POA: Diagnosis not present

## 2017-02-26 DIAGNOSIS — J301 Allergic rhinitis due to pollen: Secondary | ICD-10-CM | POA: Diagnosis not present

## 2017-02-26 DIAGNOSIS — J3089 Other allergic rhinitis: Secondary | ICD-10-CM | POA: Diagnosis not present

## 2017-03-02 DIAGNOSIS — J3089 Other allergic rhinitis: Secondary | ICD-10-CM | POA: Diagnosis not present

## 2017-03-02 DIAGNOSIS — J301 Allergic rhinitis due to pollen: Secondary | ICD-10-CM | POA: Diagnosis not present

## 2017-03-05 DIAGNOSIS — R300 Dysuria: Secondary | ICD-10-CM | POA: Diagnosis not present

## 2017-03-05 DIAGNOSIS — J301 Allergic rhinitis due to pollen: Secondary | ICD-10-CM | POA: Diagnosis not present

## 2017-03-05 DIAGNOSIS — R351 Nocturia: Secondary | ICD-10-CM | POA: Diagnosis not present

## 2017-03-05 DIAGNOSIS — J3089 Other allergic rhinitis: Secondary | ICD-10-CM | POA: Diagnosis not present

## 2017-03-05 DIAGNOSIS — N393 Stress incontinence (female) (male): Secondary | ICD-10-CM | POA: Diagnosis not present

## 2017-03-05 DIAGNOSIS — M62838 Other muscle spasm: Secondary | ICD-10-CM | POA: Diagnosis not present

## 2017-03-05 DIAGNOSIS — M6281 Muscle weakness (generalized): Secondary | ICD-10-CM | POA: Diagnosis not present

## 2017-03-09 DIAGNOSIS — J3089 Other allergic rhinitis: Secondary | ICD-10-CM | POA: Diagnosis not present

## 2017-03-09 DIAGNOSIS — J301 Allergic rhinitis due to pollen: Secondary | ICD-10-CM | POA: Diagnosis not present

## 2017-03-12 DIAGNOSIS — N393 Stress incontinence (female) (male): Secondary | ICD-10-CM | POA: Diagnosis not present

## 2017-03-12 DIAGNOSIS — M6281 Muscle weakness (generalized): Secondary | ICD-10-CM | POA: Diagnosis not present

## 2017-03-12 DIAGNOSIS — N3946 Mixed incontinence: Secondary | ICD-10-CM | POA: Diagnosis not present

## 2017-03-12 DIAGNOSIS — M62838 Other muscle spasm: Secondary | ICD-10-CM | POA: Diagnosis not present

## 2017-03-13 DIAGNOSIS — J301 Allergic rhinitis due to pollen: Secondary | ICD-10-CM | POA: Diagnosis not present

## 2017-03-13 DIAGNOSIS — J3089 Other allergic rhinitis: Secondary | ICD-10-CM | POA: Diagnosis not present

## 2017-03-19 DIAGNOSIS — J3089 Other allergic rhinitis: Secondary | ICD-10-CM | POA: Diagnosis not present

## 2017-03-19 DIAGNOSIS — M62838 Other muscle spasm: Secondary | ICD-10-CM | POA: Diagnosis not present

## 2017-03-19 DIAGNOSIS — M6281 Muscle weakness (generalized): Secondary | ICD-10-CM | POA: Diagnosis not present

## 2017-03-19 DIAGNOSIS — J301 Allergic rhinitis due to pollen: Secondary | ICD-10-CM | POA: Diagnosis not present

## 2017-03-19 DIAGNOSIS — N393 Stress incontinence (female) (male): Secondary | ICD-10-CM | POA: Diagnosis not present

## 2017-03-30 DIAGNOSIS — J301 Allergic rhinitis due to pollen: Secondary | ICD-10-CM | POA: Diagnosis not present

## 2017-03-30 DIAGNOSIS — J3089 Other allergic rhinitis: Secondary | ICD-10-CM | POA: Diagnosis not present

## 2017-04-02 DIAGNOSIS — M6281 Muscle weakness (generalized): Secondary | ICD-10-CM | POA: Diagnosis not present

## 2017-04-02 DIAGNOSIS — J301 Allergic rhinitis due to pollen: Secondary | ICD-10-CM | POA: Diagnosis not present

## 2017-04-02 DIAGNOSIS — N3281 Overactive bladder: Secondary | ICD-10-CM | POA: Diagnosis not present

## 2017-04-02 DIAGNOSIS — J3089 Other allergic rhinitis: Secondary | ICD-10-CM | POA: Diagnosis not present

## 2017-04-02 DIAGNOSIS — N393 Stress incontinence (female) (male): Secondary | ICD-10-CM | POA: Diagnosis not present

## 2017-04-02 DIAGNOSIS — R351 Nocturia: Secondary | ICD-10-CM | POA: Diagnosis not present

## 2017-04-02 DIAGNOSIS — M62838 Other muscle spasm: Secondary | ICD-10-CM | POA: Diagnosis not present

## 2017-04-04 DIAGNOSIS — I10 Essential (primary) hypertension: Secondary | ICD-10-CM | POA: Diagnosis not present

## 2017-04-04 DIAGNOSIS — J069 Acute upper respiratory infection, unspecified: Secondary | ICD-10-CM | POA: Diagnosis not present

## 2017-04-09 DIAGNOSIS — R413 Other amnesia: Secondary | ICD-10-CM | POA: Diagnosis not present

## 2017-04-09 DIAGNOSIS — M6281 Muscle weakness (generalized): Secondary | ICD-10-CM | POA: Diagnosis not present

## 2017-04-09 DIAGNOSIS — N393 Stress incontinence (female) (male): Secondary | ICD-10-CM | POA: Diagnosis not present

## 2017-04-09 DIAGNOSIS — R030 Elevated blood-pressure reading, without diagnosis of hypertension: Secondary | ICD-10-CM | POA: Diagnosis not present

## 2017-04-09 DIAGNOSIS — M62838 Other muscle spasm: Secondary | ICD-10-CM | POA: Diagnosis not present

## 2017-04-12 DIAGNOSIS — Z13220 Encounter for screening for lipoid disorders: Secondary | ICD-10-CM | POA: Diagnosis not present

## 2017-04-12 DIAGNOSIS — R7989 Other specified abnormal findings of blood chemistry: Secondary | ICD-10-CM | POA: Diagnosis not present

## 2017-04-12 DIAGNOSIS — I10 Essential (primary) hypertension: Secondary | ICD-10-CM | POA: Diagnosis not present

## 2017-04-12 DIAGNOSIS — E039 Hypothyroidism, unspecified: Secondary | ICD-10-CM | POA: Diagnosis not present

## 2017-04-12 DIAGNOSIS — Z79899 Other long term (current) drug therapy: Secondary | ICD-10-CM | POA: Diagnosis not present

## 2017-04-12 DIAGNOSIS — T50905A Adverse effect of unspecified drugs, medicaments and biological substances, initial encounter: Secondary | ICD-10-CM | POA: Diagnosis not present

## 2017-04-16 ENCOUNTER — Other Ambulatory Visit: Payer: Self-pay | Admitting: Cardiology

## 2017-04-16 DIAGNOSIS — Z8249 Family history of ischemic heart disease and other diseases of the circulatory system: Secondary | ICD-10-CM

## 2017-04-16 DIAGNOSIS — Z01810 Encounter for preprocedural cardiovascular examination: Secondary | ICD-10-CM | POA: Diagnosis not present

## 2017-04-16 DIAGNOSIS — I4510 Unspecified right bundle-branch block: Secondary | ICD-10-CM | POA: Diagnosis not present

## 2017-04-16 DIAGNOSIS — E668 Other obesity: Secondary | ICD-10-CM | POA: Diagnosis not present

## 2017-04-16 DIAGNOSIS — R030 Elevated blood-pressure reading, without diagnosis of hypertension: Secondary | ICD-10-CM | POA: Diagnosis not present

## 2017-04-16 DIAGNOSIS — G4733 Obstructive sleep apnea (adult) (pediatric): Secondary | ICD-10-CM | POA: Diagnosis not present

## 2017-04-16 DIAGNOSIS — R079 Chest pain, unspecified: Secondary | ICD-10-CM | POA: Diagnosis not present

## 2017-04-19 ENCOUNTER — Ambulatory Visit
Admission: RE | Admit: 2017-04-19 | Discharge: 2017-04-19 | Disposition: A | Discharge status: Home / Self Care | Payer: Medicare Other | Source: Ambulatory Visit | Attending: Cardiology | Admitting: Cardiology

## 2017-04-19 DIAGNOSIS — Z8249 Family history of ischemic heart disease and other diseases of the circulatory system: Secondary | ICD-10-CM

## 2017-04-19 IMAGING — CT CT HEART SCORING
1 of 2 series · 11 of 20 positions shown, 14 images · non-contrast
Comparison: Chest radiograph [DATE]

CLINICAL DATA: 66-year-old male with high cholesterol and high
triglycerides. Family history of coronary disease.

EXAM:
CT HEART FOR CALCIUM SCORING
TECHNIQUE: CT heart was performed on a 256 channel system using prospective ECG
gating. A scout and noncontrast exam (for calcium scoring) were
performed. Note that this exam targets the heart and the chest was
not imaged in its entirety.

[Series 2: smartscore - gated 0.4 sec · axial · 0.52mm/px · z∈[-196,-81]mm · 11 of 56 slices shown, 14 images]
[im 5/56  vessel]
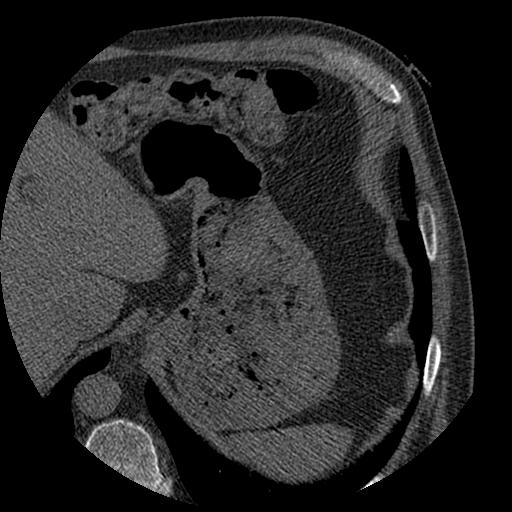
[im 5/56  lung]
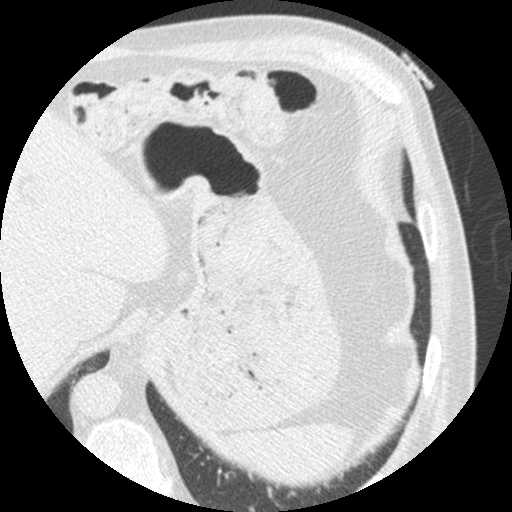
[im 10/56  vessel]
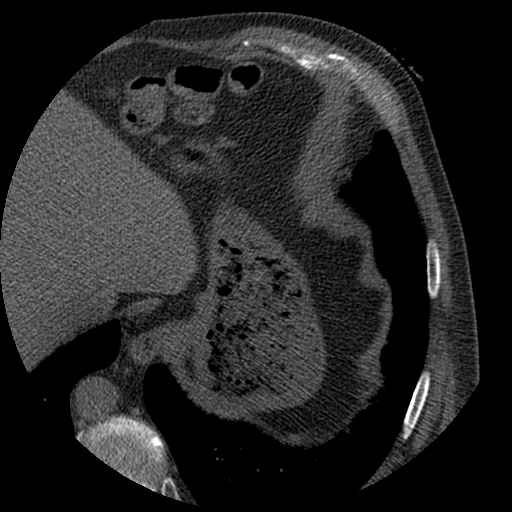
[im 14/56  vessel]
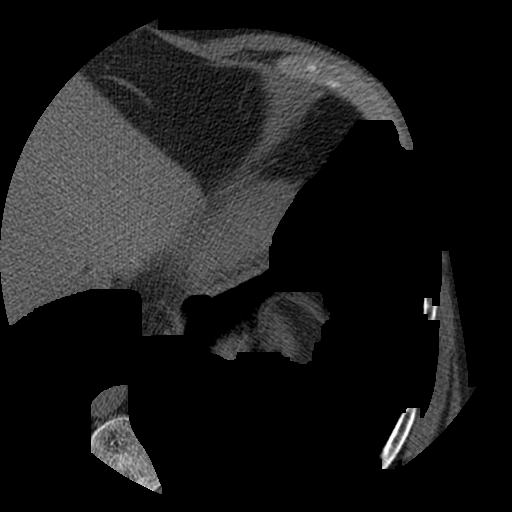
[im 19/56  vessel]
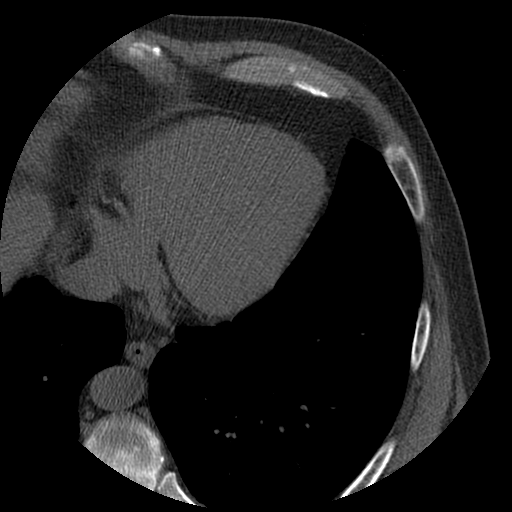
[im 23/56  vessel]
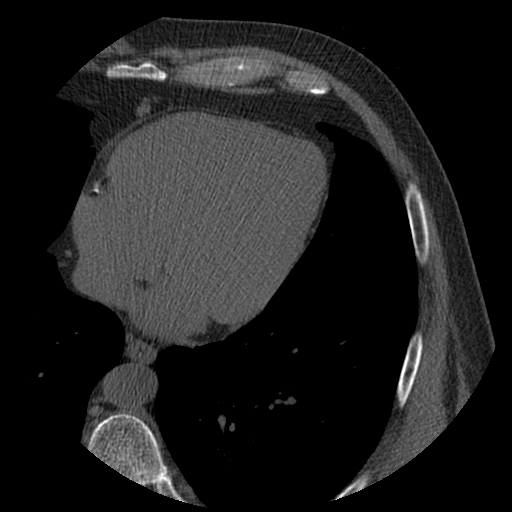
[im 23/56  lung]
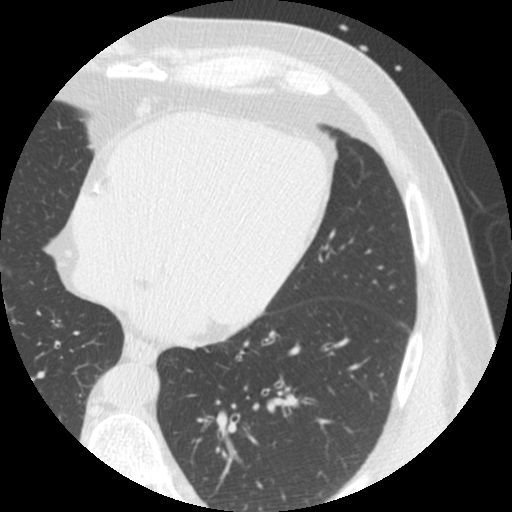
[im 28/56  vessel]
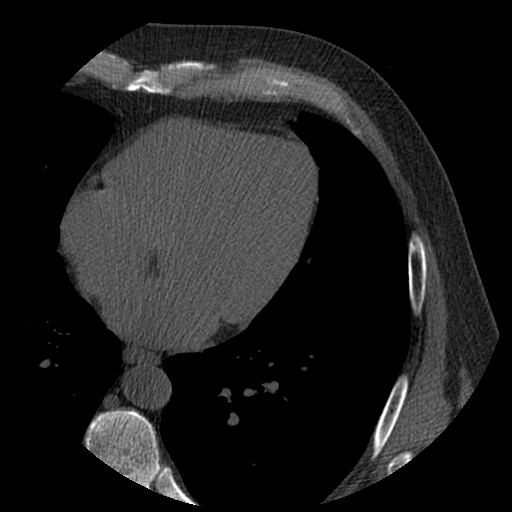
[im 33/56  vessel]
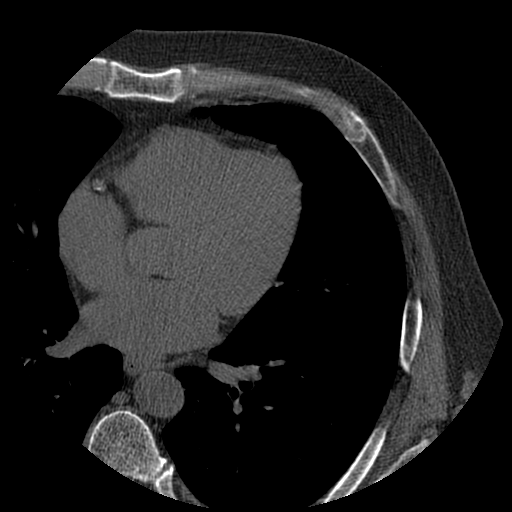
[im 37/56  vessel]
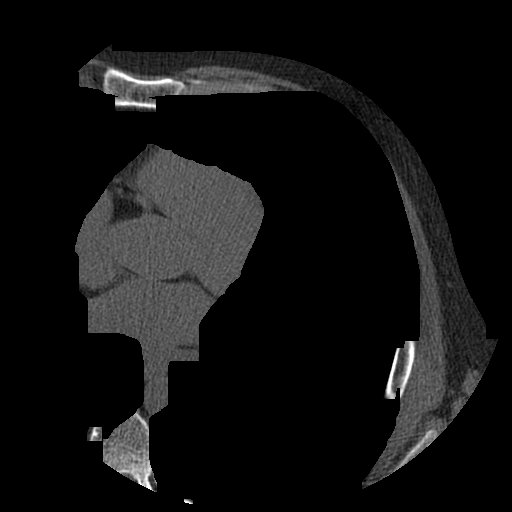
[im 42/56  vessel]
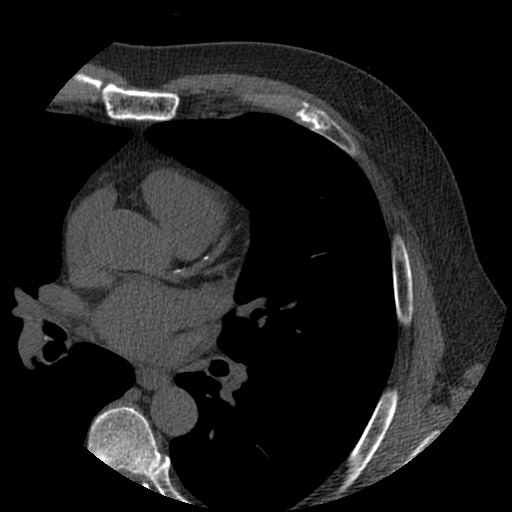
[im 42/56  lung]
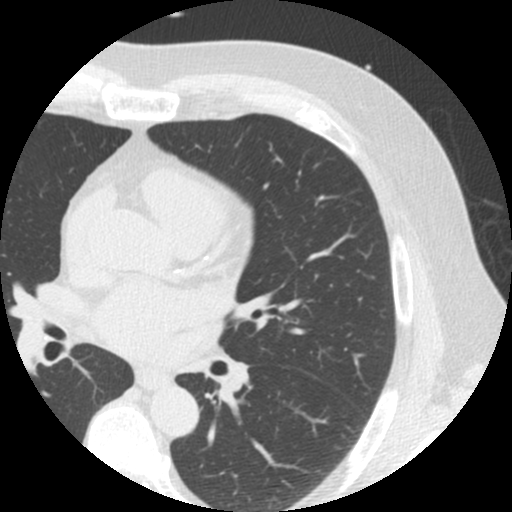
[im 46/56  vessel]
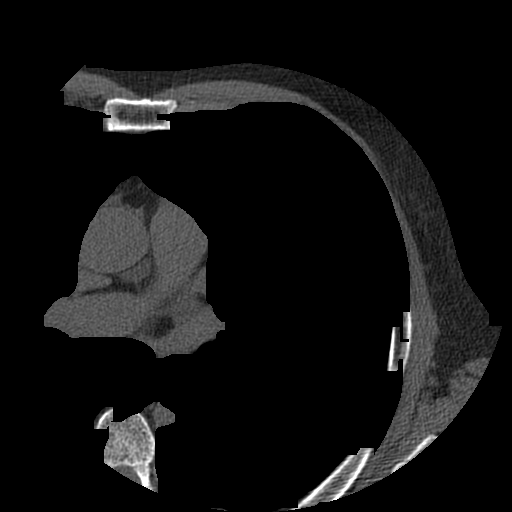
[im 51/56  vessel]
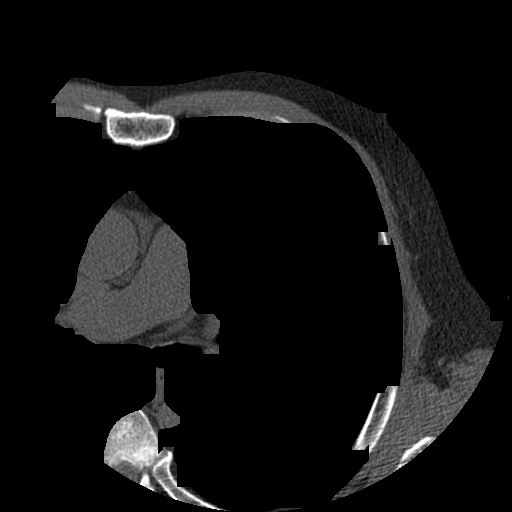

[11 of 20 positions shown; findings below may reference images not displayed]

FINDINGS: Technical quality: Good

Eccentric coronary artery calcifications in the proximal LAD and
RCA.

CORONARY CALCIUM

Total Agatston Score: 186

[HOSPITAL] percentile:  61 st

CARDIAC MEASUREMENTS:

Ascending aorta ( <  40 mm): 36 mm

Descending aorta ( <  40 mm): 23 mm

Main pulmonary artery:  ( <  30 mm): 32 mm

EXTRACARDIAC FINDINGS:

Limited view of the lung parenchyma demonstrates small 3 mm
subpleural nodule in the LEFT lung (image number 10, series 2) which
appears benign. Airways are normal.

Limited view of the mediastinum demonstrates no adenopathy.
Esophagus normal.

Limited view of the upper abdomen unremarkable.

Limited view of the skeleton and chest wall is unremarkable.
IMPRESSION: 1. LAD and RCA coronary calcifications

2. Total Agatston Score: 186

3. [HOSPITAL] percentile:  61 st

4. Small LEFT upper lobe pulmonary nodule favored benign. No
follow-up needed if patient is low-risk. Non-contrast chest CT can
be considered in 12 months if patient is high-risk. This
recommendation follows the consensus statement: Guidelines for
Management of Incidental Pulmonary Nodules Detected on CT Images:

1.

## 2017-04-27 DIAGNOSIS — R7989 Other specified abnormal findings of blood chemistry: Secondary | ICD-10-CM | POA: Diagnosis not present

## 2017-04-27 DIAGNOSIS — Z79899 Other long term (current) drug therapy: Secondary | ICD-10-CM | POA: Diagnosis not present

## 2017-04-27 DIAGNOSIS — E039 Hypothyroidism, unspecified: Secondary | ICD-10-CM | POA: Diagnosis not present

## 2017-04-27 DIAGNOSIS — T50905A Adverse effect of unspecified drugs, medicaments and biological substances, initial encounter: Secondary | ICD-10-CM | POA: Diagnosis not present

## 2017-04-27 DIAGNOSIS — I10 Essential (primary) hypertension: Secondary | ICD-10-CM | POA: Diagnosis not present

## 2017-04-27 DIAGNOSIS — Z13220 Encounter for screening for lipoid disorders: Secondary | ICD-10-CM | POA: Diagnosis not present

## 2017-04-30 DIAGNOSIS — I10 Essential (primary) hypertension: Secondary | ICD-10-CM | POA: Diagnosis not present

## 2017-05-08 DIAGNOSIS — N393 Stress incontinence (female) (male): Secondary | ICD-10-CM | POA: Diagnosis not present

## 2017-05-08 DIAGNOSIS — R351 Nocturia: Secondary | ICD-10-CM | POA: Diagnosis not present

## 2017-05-08 DIAGNOSIS — M62838 Other muscle spasm: Secondary | ICD-10-CM | POA: Diagnosis not present

## 2017-05-08 DIAGNOSIS — M6281 Muscle weakness (generalized): Secondary | ICD-10-CM | POA: Diagnosis not present

## 2017-05-08 DIAGNOSIS — N3946 Mixed incontinence: Secondary | ICD-10-CM | POA: Diagnosis not present

## 2017-05-25 DIAGNOSIS — N393 Stress incontinence (female) (male): Secondary | ICD-10-CM | POA: Diagnosis not present

## 2017-05-25 DIAGNOSIS — N401 Enlarged prostate with lower urinary tract symptoms: Secondary | ICD-10-CM | POA: Diagnosis not present

## 2017-05-25 DIAGNOSIS — R030 Elevated blood-pressure reading, without diagnosis of hypertension: Secondary | ICD-10-CM | POA: Diagnosis not present

## 2017-05-25 DIAGNOSIS — Z8249 Family history of ischemic heart disease and other diseases of the circulatory system: Secondary | ICD-10-CM | POA: Diagnosis not present

## 2017-05-25 DIAGNOSIS — G4733 Obstructive sleep apnea (adult) (pediatric): Secondary | ICD-10-CM | POA: Diagnosis not present

## 2017-05-25 DIAGNOSIS — R413 Other amnesia: Secondary | ICD-10-CM | POA: Diagnosis not present

## 2017-05-25 DIAGNOSIS — E668 Other obesity: Secondary | ICD-10-CM | POA: Diagnosis not present

## 2017-05-25 DIAGNOSIS — R972 Elevated prostate specific antigen [PSA]: Secondary | ICD-10-CM | POA: Diagnosis not present

## 2017-05-25 DIAGNOSIS — I4510 Unspecified right bundle-branch block: Secondary | ICD-10-CM | POA: Diagnosis not present

## 2017-06-12 DIAGNOSIS — I499 Cardiac arrhythmia, unspecified: Secondary | ICD-10-CM

## 2017-06-12 HISTORY — DX: Cardiac arrhythmia, unspecified: I49.9

## 2017-06-19 DIAGNOSIS — D3131 Benign neoplasm of right choroid: Secondary | ICD-10-CM | POA: Diagnosis not present

## 2017-06-21 DIAGNOSIS — L820 Inflamed seborrheic keratosis: Secondary | ICD-10-CM | POA: Diagnosis not present

## 2017-06-21 DIAGNOSIS — D1801 Hemangioma of skin and subcutaneous tissue: Secondary | ICD-10-CM | POA: Diagnosis not present

## 2017-06-21 DIAGNOSIS — D225 Melanocytic nevi of trunk: Secondary | ICD-10-CM | POA: Diagnosis not present

## 2017-06-21 DIAGNOSIS — L821 Other seborrheic keratosis: Secondary | ICD-10-CM | POA: Diagnosis not present

## 2017-06-21 DIAGNOSIS — D2371 Other benign neoplasm of skin of right lower limb, including hip: Secondary | ICD-10-CM | POA: Diagnosis not present

## 2017-06-21 DIAGNOSIS — D2362 Other benign neoplasm of skin of left upper limb, including shoulder: Secondary | ICD-10-CM | POA: Diagnosis not present

## 2017-06-21 DIAGNOSIS — D2261 Melanocytic nevi of right upper limb, including shoulder: Secondary | ICD-10-CM | POA: Diagnosis not present

## 2017-06-21 DIAGNOSIS — D2262 Melanocytic nevi of left upper limb, including shoulder: Secondary | ICD-10-CM | POA: Diagnosis not present

## 2017-06-21 DIAGNOSIS — D2239 Melanocytic nevi of other parts of face: Secondary | ICD-10-CM | POA: Diagnosis not present

## 2017-06-21 DIAGNOSIS — L308 Other specified dermatitis: Secondary | ICD-10-CM | POA: Diagnosis not present

## 2017-06-21 DIAGNOSIS — D2272 Melanocytic nevi of left lower limb, including hip: Secondary | ICD-10-CM | POA: Diagnosis not present

## 2017-06-21 DIAGNOSIS — D2271 Melanocytic nevi of right lower limb, including hip: Secondary | ICD-10-CM | POA: Diagnosis not present

## 2017-06-25 DIAGNOSIS — M6281 Muscle weakness (generalized): Secondary | ICD-10-CM | POA: Diagnosis not present

## 2017-06-25 DIAGNOSIS — N3946 Mixed incontinence: Secondary | ICD-10-CM | POA: Diagnosis not present

## 2017-06-25 DIAGNOSIS — M62838 Other muscle spasm: Secondary | ICD-10-CM | POA: Diagnosis not present

## 2017-06-25 DIAGNOSIS — R351 Nocturia: Secondary | ICD-10-CM | POA: Diagnosis not present

## 2017-07-04 DIAGNOSIS — G4700 Insomnia, unspecified: Secondary | ICD-10-CM | POA: Diagnosis not present

## 2017-07-20 DIAGNOSIS — H16223 Keratoconjunctivitis sicca, not specified as Sjogren's, bilateral: Secondary | ICD-10-CM | POA: Diagnosis not present

## 2017-07-20 DIAGNOSIS — G43819 Other migraine, intractable, without status migrainosus: Secondary | ICD-10-CM | POA: Diagnosis not present

## 2017-07-20 DIAGNOSIS — H04123 Dry eye syndrome of bilateral lacrimal glands: Secondary | ICD-10-CM | POA: Diagnosis not present

## 2017-07-20 DIAGNOSIS — D3131 Benign neoplasm of right choroid: Secondary | ICD-10-CM | POA: Diagnosis not present

## 2017-07-20 DIAGNOSIS — H25813 Combined forms of age-related cataract, bilateral: Secondary | ICD-10-CM | POA: Diagnosis not present

## 2017-07-25 ENCOUNTER — Ambulatory Visit (INDEPENDENT_AMBULATORY_CARE_PROVIDER_SITE_OTHER): Payer: Medicare Other | Admitting: Neurology

## 2017-07-25 ENCOUNTER — Encounter: Payer: Self-pay | Admitting: Neurology

## 2017-07-25 VITALS — BP 152/79 | HR 60 | Ht 70.5 in | Wt 220.0 lb

## 2017-07-25 DIAGNOSIS — R413 Other amnesia: Secondary | ICD-10-CM | POA: Diagnosis not present

## 2017-07-25 NOTE — Progress Notes (Signed)
PATIENT: Cory Mathis DOB: 03/03/1951  Chief Complaint  Patient presents with  . Short Term Memory Difficulty    MMSE 30/30 - 20 animals.  Reports issues with his short term memory over the last few years.  Marland Kitchen PCP    [name], MD     HISTORICAL  Cory Mathis is a 67 year old male, seen in refer by primary care doctor [name], for evaluation short-term memory loss, initial evaluation was on July 25, 2017.  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  He also reported a history of fever of unknown etiology, had 2 episodes 20 years apart, most recent episode was in 2014, fever up to 103, lasting for couple weeks, without clear etiology found, he was seen by different specialist, including rheumatologist, ID specialist, symptoms eventually improved by steroid tapering dose, treatment lasted for a few months.  Since 2015, he noticed mild memory loss, he can still keep very complicated spreadsheets without difficulty driving without loss, but in his daily activities, he let the water running for hours, sometimes forget why he goes to upstairs,  He also complains of chronic insomnia, gradually getting worse, was seen by sleep specialist Dr. Elenore Rota, was diagnosis of obstructive sleep apnea, using CPAP machine, also taking titrating dose of Ambien 12.5 mg every night, but sometimes it does not work.    REVIEW OF SYSTEMS: Full 14 system review of systems performed and notable only for fatigue, hearing loss, ringing the ears, snoring, feeling hot, cold, flushing, memory loss, numbness, dizziness, insomnia, sleepiness, snoring, not enough sleep, decreased energy  ALLERGIES: Allergies  Allergen Reactions  . Sulfa Antibiotics Hives  . Sulfasalazine Hives  . Flagyl [Metronidazole]  Other (See Comments)    Pt reported he was light headed for 2 weeks.  . Other     Dark chocolate and mints - makes him sneeze    HOME MEDICATIONS: Current Outpatient Medications  Medication Sig Dispense Refill  . amLODipine (NORVASC) 5 MG tablet TK 1 T PO D  3  . pravastatin (PRAVACHOL) 10 MG tablet TK 1 T PO  QOD  11  . Probiotic Product (PROBIOTIC DAILY PO) Take 1 capsule by mouth daily.    . ranitidine (ZANTAC) 150 MG tablet Take 150 mg by mouth daily.     Marland Kitchen zolpidem (AMBIEN CR) 12.5 MG CR tablet Take 12.5 mg by mouth at bedtime as needed for sleep.     No current facility-administered medications for this visit.     PAST MEDICAL HISTORY: Past Medical History:  Diagnosis Date  . BPH (benign prostatic hyperplasia)   . Diverticulosis of colon   . GERD (gastroesophageal reflux disease)    prn med. control  . History of adenomatous polyp of colon    03-29-2007  tubular adenoma/  08-07-2016  hyperplastic and tubular adenoma's  . History of alcoholic gastritis    [id number]  gastritis, esophagitis, peptic duodenitis  . History of colonic diverticulitis    01-31-2016  resolved w/ medications  . History of Helicobacter pylori infection    10-30-2008  . 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 Internal hemorrhoids   . Memory loss   . OSA on CPAP    CPAP nightly  . Right thyroid nodule   .  Urinary retention 12/13/2016    PAST SURGICAL HISTORY: Past Surgical History:  Procedure Laterality Date  . COLONOSCOPY  last one 08-07-2016  . HEMORRHOID BANDING  10-20-2016;  09-11-2016  . HERNIA REPAIR    . KNEE ARTHROSCOPY Bilateral    10 + yrs ago  . LASIK Bilateral   . PILONIDAL CYST EXCISION    . THYROIDECTOMY Left 2013  . TRANSURETHRAL RESECTION OF PROSTATE N/A 01/04/2017   Procedure: TRANSURETHRAL RESECTION OF THE PROSTATE (TURP);  Surgeon: Franchot Gallo, MD;  Location: [facility];  Service: Urology;   Laterality: N/A;  . UPPER GASTROINTESTINAL ENDOSCOPY  10/30/2008  . WISDOM TOOTH EXTRACTION      FAMILY HISTORY: Family History  Problem Relation Age of Onset  . Heart attack Mother        29's  . Other Father        atherosclerosis - 27's  . Prostate cancer Maternal Grandfather   . Other Sister        brain tumor - unsure if it was cancer - 37's  . Colon cancer Neg Hx   . Esophageal cancer Neg Hx   . Pancreatic cancer Neg Hx   . Rectal cancer Neg Hx   . Stomach cancer Neg Hx     SOCIAL HISTORY:  Social History   Socioeconomic History  . Marital status: Married    Spouse name: Not on file  . Number of children: 0  . Years of education: 16+  . Highest education level: Bachelor's degree (e.g., BA, AB, BS)  Social Needs  . Financial resource strain: Not on file  . Food insecurity - worry: Not on file  . Food insecurity - inability: Not on file  . Transportation needs - medical: Not on file  . Transportation needs - non-medical: Not on file  Occupational History  . Occupation: Retired  Tobacco Use  . Smoking status: Never Smoker  . Smokeless tobacco: Never Used  Substance and Sexual Activity  . Alcohol use: Yes    Comment: social   . Drug use: No  . Sexual activity: Not on file  Other Topics Concern  . Not on file  Social History Narrative   Left-handed.   1.5 cups caffeine per day.     PHYSICAL EXAM   Vitals:   07/25/17 0945  BP: (!) 152/79  Pulse: 60  Weight: 220 lb (99.8 kg)  Height: 5' 10.5" (1.791 m)    Not recorded      Body mass index is 31.12 kg/m.  PHYSICAL EXAMNIATION:  Gen: NAD, conversant, well nourised, obese, well groomed                     Cardiovascular: Regular rate rhythm, no peripheral edema, warm, nontender. Eyes: Conjunctivae clear without exudates or hemorrhage Neck: Supple, no carotid bruits. Pulmonary: Clear to auscultation bilaterally   NEUROLOGICAL EXAM:  MMSE - Mini Mental State Exam 07/25/2017  Orientation to  time 5  Orientation to Place 5  Registration 3  Attention/ Calculation 5  Recall 3  Language- name 2 objects 2  Language- repeat 1  Language- follow 3 step command 3  Language- read & follow direction 1  Write a sentence 1  Copy design 1  Total score 30  animal naming 20.   CRANIAL NERVES: CN II: Visual fields are full to confrontation. Fundoscopic exam is normal with sharp discs and no vascular changes. Pupils are round equal and briskly reactive to light. CN III,  IV, VI: extraocular movement are normal. No ptosis. CN V: Facial sensation is intact to pinprick in all 3 divisions bilaterally. Corneal responses are intact.  CN VII: Face is symmetric with normal eye closure and smile. CN VIII: Hearing is normal to rubbing fingers CN IX, X: Palate elevates symmetrically. Phonation is normal. CN XI: Head turning and shoulder shrug are intact CN XII: Tongue is midline with normal movements and no atrophy.  MOTOR: There is no pronator drift of out-stretched arms. Muscle bulk and tone are normal. Muscle strength is normal.  REFLEXES: Reflexes are 2+ and symmetric at the biceps, triceps, knees, and ankles. Plantar responses are flexor.  SENSORY: Intact to light touch, pinprick, positional sensation and vibratory sensation are intact in fingers and toes.  COORDINATION: Rapid alternating movements and fine finger movements are intact. There is no dysmetria on finger-to-nose and heel-knee-shin.    GAIT/STANCE: Posture is normal. Gait is steady with normal steps, base, arm swing, and turning. Heel and toe walking are normal. Tandem gait is normal.  Romberg is absent.   DIAGNOSTIC DATA (LABS, IMAGING, TESTING) - I reviewed patient records, labs, notes, testing and imaging myself where available.   ASSESSMENT AND PLAN  Cory Mathis is a 67 y.o. male   Mild cognitive impairment  MRI of the brain to rule out structural lesion  Differentiation diagnosis including central nervous  system degenerative disorder, long-term sleep deprived related, versus mood disorder related.  Bring laboratory evaluation at next follow-up visit   Marcial Pacas, M.D. Ph.D.  [facility] [address] [place], [place] [address] Ph: [phone] Fax: [phone]  CC: [name], MD

## 2017-07-27 DIAGNOSIS — E668 Other obesity: Secondary | ICD-10-CM | POA: Diagnosis not present

## 2017-07-27 DIAGNOSIS — I2510 Atherosclerotic heart disease of native coronary artery without angina pectoris: Secondary | ICD-10-CM | POA: Diagnosis not present

## 2017-07-27 DIAGNOSIS — R030 Elevated blood-pressure reading, without diagnosis of hypertension: Secondary | ICD-10-CM | POA: Diagnosis not present

## 2017-07-27 DIAGNOSIS — E7849 Other hyperlipidemia: Secondary | ICD-10-CM | POA: Diagnosis not present

## 2017-07-27 DIAGNOSIS — G4733 Obstructive sleep apnea (adult) (pediatric): Secondary | ICD-10-CM | POA: Diagnosis not present

## 2017-07-27 DIAGNOSIS — I4510 Unspecified right bundle-branch block: Secondary | ICD-10-CM | POA: Diagnosis not present

## 2017-07-27 DIAGNOSIS — Z8249 Family history of ischemic heart disease and other diseases of the circulatory system: Secondary | ICD-10-CM | POA: Diagnosis not present

## 2017-07-30 ENCOUNTER — Ambulatory Visit
Admission: RE | Admit: 2017-07-30 | Discharge: 2017-07-30 | Disposition: A | Discharge status: Home / Self Care | Payer: Medicare Other | Source: Ambulatory Visit | Attending: Neurology | Admitting: Neurology

## 2017-07-30 DIAGNOSIS — R413 Other amnesia: Secondary | ICD-10-CM

## 2017-08-01 ENCOUNTER — Telehealth: Payer: Self-pay | Admitting: *Deleted

## 2017-08-01 NOTE — Telephone Encounter (Signed)
Spoke to patient he is aware of results.

## 2017-08-01 NOTE — Telephone Encounter (Signed)
-----   Message from Marcial Pacas, MD sent at 08/01/2017  3:46 PM EST ----- Please call pt for normal MRI brain.

## 2017-08-02 DIAGNOSIS — R5383 Other fatigue: Secondary | ICD-10-CM | POA: Diagnosis not present

## 2017-08-02 DIAGNOSIS — R509 Fever, unspecified: Secondary | ICD-10-CM | POA: Diagnosis not present

## 2017-08-06 DIAGNOSIS — I10 Essential (primary) hypertension: Secondary | ICD-10-CM | POA: Diagnosis not present

## 2017-08-06 DIAGNOSIS — R509 Fever, unspecified: Secondary | ICD-10-CM | POA: Diagnosis not present

## 2017-08-14 DIAGNOSIS — R509 Fever, unspecified: Secondary | ICD-10-CM | POA: Diagnosis not present

## 2017-08-16 ENCOUNTER — Encounter: Payer: Self-pay | Admitting: Internal Medicine

## 2017-08-16 ENCOUNTER — Ambulatory Visit (INDEPENDENT_AMBULATORY_CARE_PROVIDER_SITE_OTHER): Payer: Medicare Other | Admitting: Internal Medicine

## 2017-08-16 DIAGNOSIS — G4733 Obstructive sleep apnea (adult) (pediatric): Secondary | ICD-10-CM | POA: Insufficient documentation

## 2017-08-16 DIAGNOSIS — E049 Nontoxic goiter, unspecified: Secondary | ICD-10-CM

## 2017-08-16 DIAGNOSIS — E785 Hyperlipidemia, unspecified: Secondary | ICD-10-CM | POA: Diagnosis not present

## 2017-08-16 DIAGNOSIS — Z9889 Other specified postprocedural states: Secondary | ICD-10-CM | POA: Diagnosis not present

## 2017-08-16 DIAGNOSIS — I10 Essential (primary) hypertension: Secondary | ICD-10-CM | POA: Diagnosis not present

## 2017-08-16 DIAGNOSIS — E890 Postprocedural hypothyroidism: Secondary | ICD-10-CM

## 2017-08-16 DIAGNOSIS — R509 Fever, unspecified: Secondary | ICD-10-CM | POA: Diagnosis not present

## 2017-08-16 NOTE — Progress Notes (Signed)
[facility]  Reason for Consult: Recurrent fever of unknown origin Referring Physician: Dr. Lennette Bihari Via  Assessment: Because of his recurrent fevers is uncertain.  I find no significant clues on his exam today and recent blood work has either been normal or nonspecific and nondiagnostic.  As before he is improving clinically on empiric prednisone.  I will have him continue prednisone for now and attempt to get more thorough records from his previous doctors in [place].  I will see him back next week.  Plan: 1. Continue prednisone for now 2. Obtain copies of his previous medical records 3. Follow-up in 1 week  Patient Active Problem List   Diagnosis Date Noted  . FUO (fever of unknown origin) 08/16/2017    Priority: High  . Memory loss 07/25/2017  . Enlarged prostate with urinary obstruction 01/04/2017  . Prolapsed internal hemorrhoids, grade 3 09/13/2016  . Dizziness 05/19/2016  . Eustachian tube dysfunction, bilateral 05/19/2016  . Thrush 05/19/2016    Patient's Medications  New Prescriptions   No medications on file  Previous Medications   AMLODIPINE (NORVASC) 5 MG TABLET    TK 1 T PO D   PRAVASTATIN (PRAVACHOL) 10 MG TABLET    TK 1 T PO  QOD   PREDNISONE (DELTASONE) 10 MG TABLET    TK 3 TS PO QD   PROBIOTIC PRODUCT (PROBIOTIC DAILY PO)    Take 1 capsule by mouth daily.   RANITIDINE (ZANTAC) 150 MG TABLET    Take 150 mg by mouth daily.    TRAZODONE (DESYREL) 50 MG TABLET    TK 1 T PO QD HS PRN   ZOLPIDEM (AMBIEN CR) 12.5 MG CR TABLET    Take 12.5 mg by mouth at bedtime as needed for sleep.  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  He developed unexplained fevers in 1994.  He tells me that he was hospitalized at [facility] for 12 days.  He was seen by an  infectious disease specialist and underwent an exhaustive workup.  He does not believe that any cause for the fever was ever found.  He was eventually put on prednisone.  He is not sure how long he was on it but he says it was probably weeks to months.  After starting prednisone his fevers resolved and did not return until 2015.  He was hospitalized again at [facility] for 2 days.  He saw an infectious disease specialist, Dr. Unk Lightning, a rheumatologist, Dr. Gennette Pac and an oncologist, Dr. Stephanie Acre.  He underwent another workup with extensive blood work, scans and skin biopsies.  He has copies of some of the blood test results and skin biopsy results but no progress notes or radiographic results.  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  His parvovirus IgG was positive reflecting remote, inactive infection.  Blood cultures and transthoracic echocardiogram were negative.  After no specific cause for the fevers was found he was placed back on prednisone and treated for about 4 months from March 2015 until July 2015.  His fevers resolved and  he felt back to normal.  He did well until about 4 weeks ago when he began to develop chills.  This occurred after he had been started on a new blood pressure medication and a statin.  He became concerned that he was having an adverse reaction and stopped both medications for several weeks.  He continued to have chills and then developed fevers.  He was started back on the blood pressure medication.  He says that he has been having daily temperatures up to a high of 102 degrees.  The fevers usually occur around 3 PM.  He is also had some mild headache when he had fever and sinus pressure.  He has been extremely fatigued.  He has noted some dry  slightly itchy and erythematous patches on the soles of his feet.  He has had no recurrence of the rash that he had on his left upper back in the dorsum of his right foot like he had in 2015.  He has seen Dr. Lynelle Doctor on 3 occasions recently.  He has had some leukocytosis with a predominance of segmented neutrophils and monocytes.  His sedimentation rate and C-reactive protein have been elevated.  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  His liver enzymes have been normal.  Rheumatoid factor and ANA are negative.  UA and urine culture have been negative.  He has a normal LDH.  RPR and HIV antibody are negative.  His QuantiFERON gold TB assay was negative.  He was started on a brief prednisone taper on 08/06/2017.  He felt better immediately but his fevers recurred as soon as his prednisone was stopped.  He started on prednisone 30 mg daily 2 days ago and is feeling much better.  He has had no recent travel.  He has had no unusual animal exposures.  He is married with no children.  He has no unusual hobbies.  He does not know of any family history of similar periodic fevers.    Review of Systems: Review of Systems  Constitutional: Positive for chills, fever and malaise/fatigue. Negative for diaphoresis and weight loss.  HENT: Negative for congestion and sore throat.        Sinus pressure.  Eyes: Negative for blurred vision.  Respiratory: Negative for cough, sputum production and shortness of breath.   Cardiovascular: Negative for chest pain.  Gastrointestinal: Negative for abdominal pain, diarrhea, heartburn, nausea and vomiting.  Genitourinary: Negative for dysuria.  Musculoskeletal: Negative for back pain, joint pain and myalgias.  Skin: Positive for itching and rash.       Erythema on the soles of his feet as noted in HPI.  Neurological: Positive for headaches. Negative for dizziness.  Psychiatric/Behavioral: Positive for memory loss.       He is recently been  evaluated by neurology for mild cognitive dysfunction.  Brain MRI was normal.      Past Medical History:  Diagnosis Date  . BPH (benign prostatic hyperplasia)   . Diverticulosis of colon   . GERD (gastroesophageal reflux disease)    prn med. control  . History of adenomatous polyp of colon    03-29-2007  tubular adenoma/  08-07-2016  hyperplastic and tubular adenoma's  . History of alcoholic gastritis    16-03-9603  gastritis, esophagitis, peptic duodenitis  . History of colonic diverticulitis    01-31-2016  resolved w/ medications  . History of Helicobacter pylori infection    10-30-2008  . History of thyroid nodule  thyroid multinodule goiter left side s/p  left thyroidectomy  . Internal hemorrhoids   . Memory loss   . OSA on CPAP    CPAP nightly  . Right thyroid nodule   . Urinary retention 12/13/2016    Social History   Tobacco Use  . Smoking status: Never Smoker  . Smokeless tobacco: Never Used  Substance Use Topics  . Alcohol use: Yes    Comment: social   . Drug use: No    Family History  Problem Relation Age of Onset  . Heart attack Mother        72's  . Other Father        atherosclerosis - 29's  . Prostate cancer Maternal Grandfather   . Other Sister        brain tumor - unsure if it was cancer - 13's  . Colon cancer Neg Hx   . Esophageal cancer Neg Hx   . Pancreatic cancer Neg Hx   . Rectal cancer Neg Hx   . Stomach cancer Neg Hx    Allergies  Allergen Reactions  . Sulfa Antibiotics Hives  . Sulfasalazine Hives  . Flagyl [Metronidazole] Other (See Comments)    Pt reported he was light headed for 2 weeks.  . Other     Dark chocolate and mints - makes him sneeze    OBJECTIVE: Vitals:   08/16/17 0911  BP: (!) 148/76  Pulse: 71  Temp: 98 F (36.7 C)  TempSrc: Oral  Weight: 215 lb (97.5 kg)  Height: 5\' 11"  (1.803 m)   Body mass index is 29.99 kg/m.   Physical Exam  Constitutional:  He is pleasant and in no distress.  HENT:    Mouth/Throat: No oropharyngeal exudate.  No tenderness or nodularity over his temporal or facial arteries.  Eyes: Conjunctivae are normal.  Neck: Neck supple.  Cardiovascular: Normal rate and regular rhythm.  No murmur heard. Pulmonary/Chest: Effort normal. He has no wheezes. He has no rales.  Abdominal: Soft. He exhibits no distension and no mass. There is no tenderness.  Musculoskeletal: Normal range of motion. He exhibits no edema or tenderness.  Lymphadenopathy:    He has no cervical adenopathy.    He has no axillary adenopathy.       Right: No epitrochlear adenopathy present.       Left: No epitrochlear adenopathy present.  Neurological: He is alert. Gait normal.  Skin:  Very mild and nonspecific erythema of the soles of his feet with some dry skin.  Psychiatric: Mood and affect normal.    Microbiology: No results found for this or any previous visit (from the past 240 hour(s)).  Michel Bickers, MD [facility] [phone] pager   [phone] cell 08/16/2017, 12:52 PM

## 2017-08-20 DIAGNOSIS — N393 Stress incontinence (female) (male): Secondary | ICD-10-CM | POA: Diagnosis not present

## 2017-08-20 DIAGNOSIS — M62838 Other muscle spasm: Secondary | ICD-10-CM | POA: Diagnosis not present

## 2017-08-20 DIAGNOSIS — M6281 Muscle weakness (generalized): Secondary | ICD-10-CM | POA: Diagnosis not present

## 2017-08-23 ENCOUNTER — Encounter: Payer: Self-pay | Admitting: Internal Medicine

## 2017-08-23 ENCOUNTER — Ambulatory Visit (INDEPENDENT_AMBULATORY_CARE_PROVIDER_SITE_OTHER): Payer: Medicare Other | Admitting: Internal Medicine

## 2017-08-23 DIAGNOSIS — R509 Fever, unspecified: Secondary | ICD-10-CM

## 2017-08-23 NOTE — Assessment & Plan Note (Signed)
The cause of his recurrent fevers remains unknown.  I did get one office note from his previous infectious disease doctor, Dr. William Hamburger, who saw him in March 2015.  He commented that his rheumatologic workup was negative but he suspected that he had some sort of rheumatologic illness that was resolving with prednisone.  I also got one note from his hematologist Dr. Verlee Rossetti, from April 2015 who said that there were no hematologic abnormalities.  She also said that the cause of his fever was uncertain but that he was getting better with prednisone.  As before he has improved with empiric prednisone.  After discussing all options with him today we agreed upon tapering by 5 mg weekly.  He will follow-up in 4 weeks.

## 2017-08-23 NOTE — Progress Notes (Signed)
[facility]  Patient Active Problem List   Diagnosis Date Noted  . FUO (fever of unknown origin) 08/16/2017    Priority: High  . Goiter 08/16/2017  . Status post thyroidectomy 08/16/2017  . Hypertension 08/16/2017  . Dyslipidemia 08/16/2017  . Obstructive sleep apnea 08/16/2017  . Memory loss 07/25/2017  . Enlarged prostate with urinary obstruction 01/04/2017  . Prolapsed internal hemorrhoids, grade 3 09/13/2016  . Eustachian tube dysfunction, bilateral 05/19/2016    Patient's Medications  New Prescriptions   No medications on file  Previous Medications   AMLODIPINE (NORVASC) 5 MG TABLET    TK 1 T PO D   PRAVASTATIN (PRAVACHOL) 10 MG TABLET    TK 1 T PO  QOD   PREDNISONE (DELTASONE) 10 MG TABLET    TK 3 TS PO QD   PROBIOTIC PRODUCT (PROBIOTIC DAILY PO)    Take 1 capsule by mouth daily.   RANITIDINE (ZANTAC) 150 MG TABLET    Take 150 mg by mouth daily.    TRAZODONE (DESYREL) 50 MG TABLET    TK 1 T PO QD HS PRN   ZOLPIDEM (AMBIEN CR) 12.5 MG CR TABLET    Take 12.5 mg by mouth at bedtime as needed for sleep.  Modified Medications   No medications on file  Discontinued Medications   No medications on file    Subjective: Cory Mathis is in for his routine follow-up visit.  He is remained on 30 mg of prednisone since he restarted on 08/14/2017.  He has had no more fevers.  He has had some very mild chills but overall he is feeling much better.  He is having no problems tolerating his prednisone other than having had some transient palpitations last night.  Review of Systems: Review of Systems  Constitutional: Positive for chills and malaise/fatigue. Negative for diaphoresis, fever and weight loss.  HENT: Negative for congestion and sore throat.   Respiratory: Negative for cough, sputum production and shortness of breath.   Cardiovascular: Negative for chest pain.  Gastrointestinal: Negative for abdominal pain, diarrhea, heartburn, nausea and  vomiting.  Genitourinary: Negative for dysuria and frequency.  Musculoskeletal: Negative for joint pain and myalgias.  Skin: Negative for rash.  Neurological: Negative for dizziness and headaches.    Past Medical History:  Diagnosis Date  . BPH (benign prostatic hyperplasia)   . Diverticulosis of colon   . GERD (gastroesophageal reflux disease)    prn med. control  . History of adenomatous polyp of colon    03-29-2007  tubular adenoma/  08-07-2016  hyperplastic and tubular adenoma's  . History of alcoholic gastritis    [id number]  gastritis, esophagitis, peptic duodenitis  . History of colonic diverticulitis    01-31-2016  resolved w/ medications  . History of Helicobacter pylori infection    10-30-2008  . 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 Internal hemorrhoids   . Memory loss   . OSA on CPAP    CPAP nightly  . Right thyroid nodule   . Urinary retention 12/13/2016    Social History   Tobacco Use  . Smoking status: Former Research scientist (life sciences)  . Smokeless tobacco: Never Used  Substance Use Topics  . Alcohol use: Yes    Comment: social   . Drug use: No    Family History  Problem Relation Age of Onset  . Heart attack Mother  46's  . Other Father        atherosclerosis - 83's  . Prostate cancer Maternal Grandfather   . Other Sister        brain tumor - unsure if it was cancer - 61's  . Colon cancer Neg Hx   . Esophageal cancer Neg Hx   . Pancreatic cancer Neg Hx   . Rectal cancer Neg Hx   . Stomach cancer Neg Hx     Allergies  Allergen Reactions  . Sulfa Antibiotics Hives  . Sulfasalazine Hives  . Flagyl [Metronidazole] Other (See Comments)    Pt reported he was light headed for 2 weeks.  . Other     Dark chocolate and mints - makes him sneeze    Objective: Vitals:   08/23/17 0849  BP: (!) 162/80  Pulse: 60  Temp: (!) 97.5 F (36.4 C)  TempSrc: Oral  Weight: 218 lb (98.9 kg)  Height: 5' 10.5"  (1.791 m)   Body mass index is 30.84 kg/m.  Physical Exam  Constitutional: He is oriented to person, place, and time.  He is joking and in good spirits.  HENT:  Mouth/Throat: No oropharyngeal exudate.  Eyes: Conjunctivae are normal.  Neck: Neck supple.  Cardiovascular: Normal rate and regular rhythm.  No murmur heard. Pulmonary/Chest: Effort normal and breath sounds normal. He has no wheezes. He has no rales.  Abdominal: Soft. He exhibits no mass. There is no tenderness.  Musculoskeletal: Normal range of motion. He exhibits no edema or tenderness.  Lymphadenopathy:       Head (right side): No submandibular adenopathy present.       Head (left side): No submandibular adenopathy present.    He has no cervical adenopathy.    He has no axillary adenopathy.  Neurological: He is alert and oriented to person, place, and time. Gait normal.  Skin: No rash noted.  Psychiatric: Mood and affect normal.    Lab Results    Problem List Items Addressed This Visit      High   FUO (fever of unknown origin)    The cause of his recurrent fevers remains unknown.  I did get one office note from his previous infectious disease doctor, Dr. William Hamburger, who saw him in March 2015.  He commented that his rheumatologic workup was negative but he suspected that he had some sort of rheumatologic illness that was resolving with prednisone.  I also got one note from his hematologist Dr. Verlee Rossetti, from April 2015 who said that there were no hematologic abnormalities.  She also said that the cause of his fever was uncertain but that he was getting better with prednisone.  As before he has improved with empiric prednisone.  After discussing all options with him today we agreed upon tapering by 5 mg weekly.  He will follow-up in 4 weeks.          Michel Bickers, MD [facility] for [facility] [phone] pager   [phone] cell 08/23/2017, 9:18 AM

## 2017-08-24 DIAGNOSIS — G43819 Other migraine, intractable, without status migrainosus: Secondary | ICD-10-CM | POA: Diagnosis not present

## 2017-08-24 DIAGNOSIS — H524 Presbyopia: Secondary | ICD-10-CM | POA: Diagnosis not present

## 2017-08-24 DIAGNOSIS — H5203 Hypermetropia, bilateral: Secondary | ICD-10-CM | POA: Diagnosis not present

## 2017-08-24 DIAGNOSIS — H04123 Dry eye syndrome of bilateral lacrimal glands: Secondary | ICD-10-CM | POA: Diagnosis not present

## 2017-08-24 DIAGNOSIS — H52223 Regular astigmatism, bilateral: Secondary | ICD-10-CM | POA: Diagnosis not present

## 2017-08-24 DIAGNOSIS — H16223 Keratoconjunctivitis sicca, not specified as Sjogren's, bilateral: Secondary | ICD-10-CM | POA: Diagnosis not present

## 2017-08-24 DIAGNOSIS — D3131 Benign neoplasm of right choroid: Secondary | ICD-10-CM | POA: Diagnosis not present

## 2017-08-28 DIAGNOSIS — G4700 Insomnia, unspecified: Secondary | ICD-10-CM | POA: Diagnosis not present

## 2017-09-05 ENCOUNTER — Telehealth: Payer: Self-pay | Admitting: *Deleted

## 2017-09-05 ENCOUNTER — Other Ambulatory Visit: Payer: Self-pay | Admitting: *Deleted

## 2017-09-05 MED ORDER — PREDNISONE 10 MG PO TABS: 20.0000 mg | ORAL_TABLET | Freq: Every day | ORAL | 0 refills | Status: DC

## 2017-09-05 NOTE — Telephone Encounter (Signed)
Rx sent to the pharmacy per Dr Megan Salon instructions. Called the patient to advise of the taper and had to leave a message to call the office when he could.

## 2017-09-05 NOTE — Telephone Encounter (Signed)
Patient is requesting a refill of the Prednisone advised he will be out of it before his follow up appt 09/27/17. Advised him will have to ask the doctor if we should refill and if it needs a taper as we were not the original prescribes and per the note they discussed a taper. Advised him will contact him once the doctor responds.

## 2017-09-05 NOTE — Telephone Encounter (Signed)
Please give him prednisone 10 mg tablets with enough to take 20 mg daily until 09/19/2017 and then taper down to 10 mg daily thereafter until he sees me on 09/27/2017.  Thanks.

## 2017-09-17 ENCOUNTER — Ambulatory Visit (INDEPENDENT_AMBULATORY_CARE_PROVIDER_SITE_OTHER): Payer: Medicare Other | Admitting: Neurology

## 2017-09-17 ENCOUNTER — Encounter: Payer: Self-pay | Admitting: Neurology

## 2017-09-17 VITALS — BP 139/70 | HR 60 | Ht 70.5 in | Wt 219.0 lb

## 2017-09-17 DIAGNOSIS — R413 Other amnesia: Secondary | ICD-10-CM

## 2017-09-17 NOTE — Progress Notes (Signed)
PATIENT: Cory Mathis DOB: Feb 12, 1951  Chief Complaint  Patient presents with  . Mild Cognitive Issues    No changes in symptoms. Last MMSE on 07/25/17 was 30/30.  He is aware of his normal brain MRI.     HISTORICAL  Cory Mathis is a 67 year old male, seen in refer by primary care doctor [name], for evaluation short-term memory loss, initial evaluation was on July 25, 2017.  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  He also reported a history of fever of unknown etiology, had 2 episodes 20 years apart, most recent episode was in 2014, fever up to 103, lasting for couple weeks, without clear etiology found, he was seen by different specialist, including rheumatologist, ID specialist, symptoms eventually improved by steroid tapering dose, treatment lasted for a few months.  Since 2015, he noticed mild memory loss, he can still keep very complicated spreadsheets without difficulty driving without loss, but in his daily activities, he let the water running for hours, sometimes forget why he goes to upstairs,  He also complains of chronic insomnia, gradually getting worse, was seen by sleep specialist Dr. Elenore Rota, was diagnosis of obstructive sleep apnea, using CPAP machine, also taking titrating dose of Ambien 12.5 mg every night, but sometimes it does not work.  UPDATE September 17 2017: He got fever again at the end of February, had extensive laboratory evaluation by his primary care doctor Via, was also seen by infectious disease Dr. Megan Salon, no etiology was found, he was treated with prednisone, whenever he taper off the prednisone, his fever come back at 4 PM, now is on 25 mg daily, continue complains of difficulty sleeping at nighttime, taking Ambien 12.5 mg daily We have  personally reviewed MRI of the brain without contrast in February 2019 that was normal  Laboratory evaluation showed normal CBC CMP, TSH, fasting lipid profile, LDL was 86   REVIEW OF SYSTEMS: Full 14 system review of systems performed and notable only for fatigue, light sensitivity, insomnia, apnea, frequent urination, environmental allergy  ALLERGIES: Allergies  Allergen Reactions  . Sulfa Antibiotics Hives  . Sulfasalazine Hives  . Flagyl [Metronidazole] Other (See Comments)    Pt reported he was light headed for 2 weeks.  . Other     Dark chocolate and mints - makes him sneeze    HOME MEDICATIONS: Current Outpatient Medications  Medication Sig Dispense Refill  . amLODipine (NORVASC) 5 MG tablet TK 1 T PO D  3  . predniSONE (DELTASONE) 10 MG tablet Take 2 tablets (20 mg total) by mouth daily. For 13 days. Then 1 tab daily until gone (Patient taking differently: Take 25 mg by mouth daily. For 13 days. Then 1 tab daily until gone) 32 tablet 0  . Probiotic Product (PROBIOTIC DAILY PO) Take 1 capsule by mouth daily.    . ranitidine (ZANTAC) 150 MG tablet Take 150 mg by mouth daily.     Marland Kitchen zolpidem (AMBIEN CR) 12.5 MG CR tablet Take 12.5 mg by mouth at bedtime as needed for sleep.     No current facility-administered medications for this visit.     PAST MEDICAL HISTORY: Past Medical History:  Diagnosis Date  . BPH (benign prostatic hyperplasia)   . Diverticulosis of colon   . GERD (gastroesophageal reflux disease)    prn  med. control  . History of adenomatous polyp of colon    03-29-2007  tubular adenoma/  08-07-2016  hyperplastic and tubular adenoma's  . History of alcoholic gastritis    [id number]  gastritis, esophagitis, peptic duodenitis  . History of colonic diverticulitis    01-31-2016  resolved w/ medications  . History of Helicobacter pylori infection    10-30-2008  . 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  Internal hemorrhoids   . Memory loss   . OSA on CPAP    CPAP nightly  . Right thyroid nodule   . Urinary retention 12/13/2016    PAST SURGICAL HISTORY: Past Surgical History:  Procedure Laterality Date  . COLONOSCOPY  last one 08-07-2016  . HEMORRHOID BANDING  10-20-2016;  09-11-2016  . HERNIA REPAIR    . KNEE ARTHROSCOPY Bilateral    10 + yrs ago  . LASIK Bilateral   . PILONIDAL CYST EXCISION    . THYROIDECTOMY Left 2013  . TRANSURETHRAL RESECTION OF PROSTATE N/A 01/04/2017   Procedure: TRANSURETHRAL RESECTION OF THE PROSTATE (TURP);  Surgeon: Franchot Gallo, MD;  [facility];  Service: Urology;  Laterality: N/A;  . UPPER GASTROINTESTINAL ENDOSCOPY  10/30/2008  . WISDOM TOOTH EXTRACTION      FAMILY HISTORY: Family History  Problem Relation Age of Onset  . Heart attack Mother        44's  . Other Father        atherosclerosis - 61's  . Prostate cancer Maternal Grandfather   . Other Sister        brain tumor - unsure if it was cancer - 47's  . Colon cancer Neg Hx   . Esophageal cancer Neg Hx   . Pancreatic cancer Neg Hx   . Rectal cancer Neg Hx   . Stomach cancer Neg Hx     SOCIAL HISTORY:  Social History   Socioeconomic History  . Marital status: Married    Spouse name: Not on file  . Number of children: 0  . Years of education: 16+  . Highest education level: Bachelor's degree (e.g., BA, AB, BS)  Occupational History  . Occupation: Retired  Scientific laboratory technician  . Financial resource strain: Not on file  . Food insecurity:    Worry: Not on file    Inability: Not on file  . Transportation needs:    Medical: Not on file    Non-medical: Not on file  Tobacco Use  . Smoking status: Former Research scientist (life sciences)  . Smokeless tobacco: Never Used  Substance and Sexual Activity  . Alcohol use: Yes    Comment: social   . Drug use: No  . Sexual activity: Not on file  Lifestyle  . Physical activity:    Days per week: Not on file    Minutes per session:  Not on file  . Stress: Not on file  Relationships  . Social connections:    Talks on phone: Not on file    Gets together: Not on file    Attends religious service: Not on file    Active member of club or organization: Not on file    Attends meetings of clubs or organizations: Not on file    Relationship status: Not on file  . Intimate partner violence:    Fear of current or ex partner: Not on file    Emotionally abused: Not on file    Physically abused: Not on  file    Forced sexual activity: Not on file  Other Topics Concern  . Not on file  Social History Narrative   Left-handed.   1.5 cups caffeine per day.     PHYSICAL EXAM   Vitals:   09/17/17 0849  BP: 139/70  Pulse: 60  Weight: 219 lb (99.3 kg)  Height: 5' 10.5" (1.791 m)    Not recorded      Body mass index is 30.98 kg/m.  PHYSICAL EXAMNIATION:  Gen: NAD, conversant, well nourised, obese, well groomed                     Cardiovascular: Regular rate rhythm, no peripheral edema, warm, nontender. Eyes: Conjunctivae clear without exudates or hemorrhage Neck: Supple, no carotid bruits. Pulmonary: Clear to auscultation bilaterally   NEUROLOGICAL EXAM:  MMSE - Mini Mental State Exam 07/25/2017  Orientation to time 5  Orientation to Place 5  Registration 3  Attention/ Calculation 5  Recall 3  Language- name 2 objects 2  Language- repeat 1  Language- follow 3 step command 3  Language- read & follow direction 1  Write a sentence 1  Copy design 1  Total score 30  animal naming 20.   CRANIAL NERVES: CN II: Visual fields are full to confrontation. Fundoscopic exam is normal with sharp discs and no vascular changes. Pupils are round equal and briskly reactive to light. CN III, IV, VI: extraocular movement are normal. No ptosis. CN V: Facial sensation is intact to pinprick in all 3 divisions bilaterally. Corneal responses are intact.  CN VII: Face is symmetric with normal eye closure and smile. CN VIII:  Hearing is normal to rubbing fingers CN IX, X: Palate elevates symmetrically. Phonation is normal. CN XI: Head turning and shoulder shrug are intact CN XII: Tongue is midline with normal movements and no atrophy.  MOTOR: There is no pronator drift of out-stretched arms. Muscle bulk and tone are normal. Muscle strength is normal.  REFLEXES: Reflexes are 2+ and symmetric at the biceps, triceps, knees, and ankles. Plantar responses are flexor.  SENSORY: Intact to light touch, pinprick, positional sensation and vibratory sensation are intact in fingers and toes.  COORDINATION: Rapid alternating movements and fine finger movements are intact. There is no dysmetria on finger-to-nose and heel-knee-shin.    GAIT/STANCE: Posture is normal. Gait is steady with normal steps, base, arm swing, and turning. Heel and toe walking are normal. Tandem gait is normal.  Romberg is absent.   DIAGNOSTIC DATA (LABS, IMAGING, TESTING) - I reviewed patient records, labs, notes, testing and imaging myself where available.   ASSESSMENT AND PLAN  Cory Mathis is a 67 y.o. male   Mild cognitive impairment  MRI of the brain was normal in February 2019  Differentiation diagnosis including central nervous system degenerative disorder, long-term sleep deprived related, versus mood disorder related.  Laboratory evaluation showed no significant abnormality.  Marcial Pacas, M.D. Ph.D.  [facility] [address], [place], [place] [address] Ph: [phone] Fax: [phone]  CC: [name], MD

## 2017-09-27 ENCOUNTER — Encounter: Payer: Self-pay | Admitting: Internal Medicine

## 2017-09-27 ENCOUNTER — Ambulatory Visit (INDEPENDENT_AMBULATORY_CARE_PROVIDER_SITE_OTHER): Payer: Medicare Other | Admitting: Internal Medicine

## 2017-09-27 DIAGNOSIS — R509 Fever, unspecified: Secondary | ICD-10-CM | POA: Diagnosis not present

## 2017-09-27 NOTE — Progress Notes (Signed)
[place] for Infectious Disease  Patient Active Problem List   Diagnosis Date Noted  . FUO (fever of unknown origin) 08/16/2017    Priority: High  . Goiter 08/16/2017  . Status post thyroidectomy 08/16/2017  . Hypertension 08/16/2017  . Dyslipidemia 08/16/2017  . Obstructive sleep apnea 08/16/2017  . Memory loss 07/25/2017  . Enlarged prostate with urinary obstruction 01/04/2017  . Prolapsed internal hemorrhoids, grade 3 09/13/2016  . Eustachian tube dysfunction, bilateral 05/19/2016    Patient's Medications  New Prescriptions   No medications on file  Previous Medications   AMLODIPINE (NORVASC) 5 MG TABLET    TK 1 T PO D   PREDNISONE (DELTASONE) 10 MG TABLET    Take 2 tablets (20 mg total) by mouth daily. For 13 days. Then 1 tab daily until gone   PROBIOTIC PRODUCT (PROBIOTIC DAILY PO)    Take 1 capsule by mouth daily.   RANITIDINE (ZANTAC) 150 MG TABLET    Take 150 mg by mouth daily.    ZOLPIDEM (AMBIEN CR) 12.5 MG CR TABLET    Take 12.5 mg by mouth at bedtime as needed for sleep.  Modified Medications   No medications on file  Discontinued Medications   No medications on file    Subjective: Cory Mathis is in for his routine follow-up visit.  He did start a prednisone taper after his last visit.  He forgot to take his 25 mg of prednisone on 08/29/2017.  He resumed at the following day but had chills and a fever of 102 degrees associated with nausea and vomiting.  He increased his prednisone the following day to 35 mg and has just slowly taper down to 20 mg daily.  He is not sleeping well but otherwise has had no change in his health.  Review of Systems: Review of Systems  Constitutional: Positive for chills, fever and malaise/fatigue. Negative for diaphoresis and weight loss.  HENT: Negative for sore throat.   Respiratory: Negative for cough, sputum production and shortness of breath.   Cardiovascular: Negative for chest pain.  Gastrointestinal: Positive for  nausea and vomiting. Negative for abdominal pain and diarrhea.  Musculoskeletal: Negative for joint pain and myalgias.  Skin: Negative for rash.  Neurological: Negative for headaches.  Psychiatric/Behavioral: The patient has insomnia.     Past Medical History:  Diagnosis Date  . BPH (benign prostatic hyperplasia)   . Diverticulosis of colon   . GERD (gastroesophageal reflux disease)    prn med. control  . History of adenomatous polyp of colon    03-29-2007  tubular adenoma/  08-07-2016  hyperplastic and tubular adenoma's  . History of alcoholic gastritis    [id number]  gastritis, esophagitis, peptic duodenitis  . History of colonic diverticulitis    01-31-2016  resolved w/ medications  . History of Helicobacter pylori infection    10-30-2008  . 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 Internal hemorrhoids   . Memory loss   . OSA on CPAP    CPAP nightly  . Right thyroid nodule   . Urinary retention 12/13/2016    Social History   Tobacco Use  . Smoking status: Former Research scientist (life sciences)  . Smokeless tobacco: Never Used  Substance Use Topics  . Alcohol use: Yes    Comment: social   . Drug use: No    Family History  Problem Relation Age of Onset  . Heart attack Mother  40's  . Other Father        atherosclerosis - 74's  . Prostate cancer Maternal Grandfather   . Other Sister        brain tumor - unsure if it was cancer - 30's  . Colon cancer Neg Hx   . Esophageal cancer Neg Hx   . Pancreatic cancer Neg Hx   . Rectal cancer Neg Hx   . Stomach cancer Neg Hx     Allergies  Allergen Reactions  . Sulfa Antibiotics Hives  . Sulfasalazine Hives  . Flagyl [Metronidazole] Other (See Comments)    Pt reported he was light headed for 2 weeks.  . Other     Dark chocolate and mints - makes him sneeze    Objective: Vitals:   09/27/17 0921  BP: (!) 146/63  Pulse: 67  Temp: 97.9 F (36.6 C)  TempSrc: Oral  Weight: 215  lb (97.5 kg)  Height: 5' 10.5" (1.791 m)   Body mass index is 30.41 kg/m.  Physical Exam  Constitutional: He is oriented to person, place, and time.  He is pleasant and in no distress.  There is been no change in his weight.  HENT:  Mouth/Throat: No oropharyngeal exudate.  Eyes: Conjunctivae are normal.  Cardiovascular: Normal rate, regular rhythm and normal heart sounds.  No murmur heard. Pulmonary/Chest: Effort normal and breath sounds normal. He has no wheezes. He has no rales.  Abdominal: Soft. He exhibits no distension. There is no tenderness.  Neurological: He is alert and oriented to person, place, and time.  Skin: No rash noted.  Psychiatric: He has a normal mood and affect.    Lab Results    Problem List Items Addressed This Visit      High   FUO (fever of unknown origin)    There are still no new clues as to what might be causing his recurrent fever of unknown origin.  I will check blood work today and I have recommended that he continue tapering his prednisone by 5 mg each week so that he tapers off next month.  He will follow-up here in 6 weeks.      Relevant Orders   CBC   Comprehensive metabolic panel   Sedimentation rate   C-reactive protein       Michel Bickers, MD [facility] for [facility] [place] [phone] pager   [phone] cell 09/27/2017, 9:48 AM

## 2017-09-27 NOTE — Assessment & Plan Note (Signed)
There are still no new clues as to what might be causing his recurrent fever of unknown origin.  I will check blood work today and I have recommended that he continue tapering his prednisone by 5 mg each week so that he tapers off next month.  He will follow-up here in 6 weeks.

## 2017-09-28 LAB — COMPREHENSIVE METABOLIC PANEL
AG RATIO: 1.6 (calc) (ref 1.0–2.5)
ALT: 17 U/L (ref 9–46)
AST: 22 U/L (ref 10–35)
Albumin: 4.1 g/dL (ref 3.6–5.1)
Alkaline phosphatase (APISO): 60 U/L (ref 40–115)
BUN: 16 mg/dL (ref 7–25)
CALCIUM: 9.3 mg/dL (ref 8.6–10.3)
CO2: 26 mmol/L (ref 20–32)
Chloride: 106 mmol/L (ref 98–110)
Creat: 0.86 mg/dL (ref 0.70–1.25)
Globulin: 2.6 g/dL (calc) (ref 1.9–3.7)
Glucose, Bld: 89 mg/dL (ref 65–99)
Potassium: 4.2 mmol/L (ref 3.5–5.3)
Sodium: 139 mmol/L (ref 135–146)
Total Bilirubin: 0.6 mg/dL (ref 0.2–1.2)
Total Protein: 6.7 g/dL (ref 6.1–8.1)

## 2017-09-28 LAB — CBC
HCT: 40.8 % (ref 38.5–50.0)
HEMOGLOBIN: 14.4 g/dL (ref 13.2–17.1)
MCH: 32 pg (ref 27.0–33.0)
MCHC: 35.3 g/dL (ref 32.0–36.0)
MCV: 90.7 fL (ref 80.0–100.0)
MPV: 9.4 fL (ref 7.5–12.5)
Platelets: 217 10*3/uL (ref 140–400)
RBC: 4.5 10*6/uL (ref 4.20–5.80)
RDW: 13.1 % (ref 11.0–15.0)
WBC: 10.9 10*3/uL — AB (ref 3.8–10.8)

## 2017-09-28 LAB — SEDIMENTATION RATE: Sed Rate: 2 mm/h (ref 0–20)

## 2017-09-28 LAB — C-REACTIVE PROTEIN: CRP: 1.1 mg/L (ref <8.0)

## 2017-10-02 ENCOUNTER — Telehealth: Payer: Self-pay | Admitting: *Deleted

## 2017-10-02 NOTE — Telephone Encounter (Signed)
-----   Message from [place] sent at 10/02/2017 10:27 AM EDT ----- Contact: [phone] Test results not showing on MyChart. Would like a call back with results

## 2017-10-02 NOTE — Telephone Encounter (Signed)
Spoke to patient, let him know that labs are automatically released after 10 days, unless manually released by the physician. Patient asked for Sedimentation Rate (2) and white blood cell result (10.9). Patient was satisfied. Landis Gandy, RN

## 2017-10-12 ENCOUNTER — Encounter

## 2017-10-12 ENCOUNTER — Ambulatory Visit (INDEPENDENT_AMBULATORY_CARE_PROVIDER_SITE_OTHER): Payer: Medicare Other | Admitting: Internal Medicine

## 2017-10-12 ENCOUNTER — Encounter: Payer: Self-pay | Admitting: Internal Medicine

## 2017-10-12 VITALS — BP 152/78 | HR 60 | Ht 70.5 in | Wt 224.4 lb

## 2017-10-12 DIAGNOSIS — E785 Hyperlipidemia, unspecified: Secondary | ICD-10-CM | POA: Diagnosis not present

## 2017-10-12 DIAGNOSIS — R509 Fever, unspecified: Secondary | ICD-10-CM | POA: Diagnosis not present

## 2017-10-12 DIAGNOSIS — I10 Essential (primary) hypertension: Secondary | ICD-10-CM

## 2017-10-12 NOTE — Progress Notes (Signed)
Cardiology Office Note   Date:  10/12/2017   ID:  Cory Mathis, DOB [birth year], MRN [id number]  PCP:  [name], MD  Cardiologist:   Dorris Carnes, MD   Patient is a 67 yo who is self referred for eval/Rx of HTN and HL    History of Present Illness: Benjimin Mathis is a 67 y.o. male who moved to [place] from [place] in 2016   He established with [facility]   He has a histroy of anxiety, HTN, RBBB, sleep apnea, GERD, palpitaitons , chest painand HL   He is followed at [facility].   He has also been seen by Thurman Coyer   Wants to establish with another cardiologist.    The pat was first told BP was high in June 2018   Amlodipine started at that time     Last seen in IM on August 14 2017   Fevers   Has had in past  Underwent extensive w/u in past when he has had (per pt report)  Recent WBC, ESR, CRP elevated     Now on trial of prednisone.  Improving  Seen by Thurman Coyer last fall   Calcium score done   186   Recomm statin     Lipids from Feb 2019 LDL 86   HDL 399  Tribg 104    Pravastatin sopped Feb 2019  COncern for SE   COmplained of fatigue, aches, flushing  Migraines    Stopped amlodipine in 2/21  Fevers chills    On 2/26 started prednisone and restarted amlodipine         Pt says his BP has been fine until had TURP (July 2018)   Actually 1 month before     Amlodipine started   Pt was seen by Thurman Coyer  Did not want to return   Current Meds  Medication Sig  . amLODipine (NORVASC) 5 MG tablet TK 1 T PO D  . predniSONE (DELTASONE) 10 MG tablet Use as directed.  . Probiotic Product (PROBIOTIC DAILY PO) Take 1 capsule by mouth daily.  . ranitidine (ZANTAC) 150 MG tablet Take 150 mg by mouth daily.   Marland Kitchen zolpidem (AMBIEN CR) 12.5 MG CR tablet Take 12.5 mg by mouth at bedtime as needed for sleep.     Allergies:   Sulfa antibiotics; Sulfasalazine; Flagyl [metronidazole]; and Other   Past Medical History:  Diagnosis Date  . BPH (benign prostatic hyperplasia)   .  Diverticulosis of colon   . GERD (gastroesophageal reflux disease)    prn med. control  . History of adenomatous polyp of colon    03-29-2007  tubular adenoma/  08-07-2016  hyperplastic and tubular adenoma's  . History of alcoholic gastritis    [id number]  gastritis, esophagitis, peptic duodenitis  . History of colonic diverticulitis    01-31-2016  resolved w/ medications  . History of Helicobacter pylori infection    10-30-2008  . 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 Internal hemorrhoids   . Memory loss   . OSA on CPAP    CPAP nightly  . Right thyroid nodule   . Urinary retention 12/13/2016    Past Surgical History:  Procedure Laterality Date  . COLONOSCOPY  last one 08-07-2016  . HEMORRHOID BANDING  10-20-2016;  09-11-2016  . HERNIA REPAIR    . KNEE ARTHROSCOPY Bilateral    10 + yrs ago  . LASIK Bilateral   .  PILONIDAL CYST EXCISION    . THYROIDECTOMY Left 2013  . TRANSURETHRAL RESECTION OF PROSTATE N/A 01/04/2017   Procedure: TRANSURETHRAL RESECTION OF THE PROSTATE (TURP);  Surgeon: Franchot Gallo, MD;  Location: [facility];  Service: Urology;  Laterality: N/A;  . UPPER GASTROINTESTINAL ENDOSCOPY  10/30/2008  . WISDOM TOOTH EXTRACTION       Social History:  The patient  reports that he has quit smoking. He has never used smokeless tobacco. He reports that he drinks alcohol. He reports that he does not use drugs.   Family History:  The patient's family history includes Heart attack in his mother; Other in his father and sister; Prostate cancer in his maternal grandfather.    ROS:  Please see the history of present illness. All other systems are reviewed and  Negative to the above problem except as noted.    PHYSICAL EXAM: VS:  BP (!) 152/78   Pulse 60   Ht 5' 10.5" (1.791 m)   Wt 224 lb 6.4 oz (101.8 kg)   BMI 31.74 kg/m   GEN: Well nourished, well developed, in no acute distress  HEENT:  normal  Neck: no JVD, carotid bruits, or masses Cardiac: RRR; no murmurs, rubs, or gallops,no edema  Respiratory:  clear to auscultation bilaterally, normal work of breathing GI: soft, nontender, nondistended, + BS  No hepatomegaly  MS: no deformity Moving all extremities   Skin: warm and dry, no rash Neuro:  Strength and sensation are intact Psych: euthymic mood, full affect   EKG:  EKG is not ordered today.SR with RBBB in 2015   In 2016 Incomp RBBB   Lipid Panel No results found for: CHOL, TRIG, HDL, CHOLHDL, VLDL, LDLCALC, LDLDIRECT    Wt Readings from Last 3 Encounters:  10/12/17 224 lb 6.4 oz (101.8 kg)  09/27/17 215 lb (97.5 kg)  09/17/17 219 lb (99.3 kg)      ASSESSMENT AND PLAN:  1  HTN  BP has been labile  Pt brought in readings from home that he has taken over past few months 120s to 140s/ 60s to 80s   He stopped amlodipine for a few days then restarted.  BP is high today    Higher than home readings I would recomm he keep monitoring BP at home   Bring in cuff and readings. Into clinic in next 6 to 8 wks    2   HL   Pt with mild plaquing   Lipids are not bad   For now would follow until other issues improve   Would consider statin for plaque stabilization  3  Palpitaitons   Denies   4   Fever   Continue prednisone taper  Current medicines are reviewed at length with the patient today.  The patient does not have concerns regarding medicines.  Signed, Dorris Carnes, MD  10/12/2017 11:01 AM    [address], [place], [place]  [address] Phone: [phone]; Fax: [phone]

## 2017-10-12 NOTE — Patient Instructions (Signed)
Your physician recommends that you continue on your current medications as directed. Please refer to the Current Medication list given to you today. Your physician recommends that you schedule a follow-up appointment in: 6-8 weeks with Dr. Harrington Challenger.   We will call you to schedule an appointment. Continue to monitor your blood pressure. Bring list of readings with you.

## 2017-11-08 ENCOUNTER — Encounter: Payer: Self-pay | Admitting: Internal Medicine

## 2017-11-08 ENCOUNTER — Ambulatory Visit (INDEPENDENT_AMBULATORY_CARE_PROVIDER_SITE_OTHER): Payer: Medicare Other | Admitting: Internal Medicine

## 2017-11-08 DIAGNOSIS — R509 Fever, unspecified: Secondary | ICD-10-CM | POA: Diagnosis present

## 2017-11-08 NOTE — Assessment & Plan Note (Signed)
The cause of his recurrent FUO remains uncertain.  He has had extensive evaluations on 2 previous occasions without any abnormalities found that would explain his fevers.  Recent examinations have been unremarkable.  His sed rate and C-reactive protein were normal at the time of his last visit.  His CBC showed only slight elevation of his white blood count and viral panel was negative.  He will stay off of prednisone for now and follow-up in 1 month.

## 2017-11-08 NOTE — Progress Notes (Signed)
[facility] for Infectious Disease  Patient Active Problem List   Diagnosis Date Noted  . FUO (fever of unknown origin) 08/16/2017    Priority: High  . Goiter 08/16/2017  . Status post thyroidectomy 08/16/2017  . Hypertension 08/16/2017  . Dyslipidemia 08/16/2017  . Obstructive sleep apnea 08/16/2017  . Memory loss 07/25/2017  . Enlarged prostate with urinary obstruction 01/04/2017  . Prolapsed internal hemorrhoids, grade 3 09/13/2016  . Eustachian tube dysfunction, bilateral 05/19/2016    Patient's Medications  New Prescriptions   No medications on file  Previous Medications   AMLODIPINE (NORVASC) 5 MG TABLET    TK 1 T PO D   PREDNISONE (DELTASONE) 10 MG TABLET    Use as directed.   PROBIOTIC PRODUCT (PROBIOTIC DAILY PO)    Take 1 capsule by mouth daily.   RANITIDINE (ZANTAC) 150 MG TABLET    Take 150 mg by mouth daily.    ZOLPIDEM (AMBIEN CR) 12.5 MG CR TABLET    Take 12.5 mg by mouth at bedtime as needed for sleep.  Modified Medications   No medications on file  Discontinued Medications   No medications on file    Subjective: Cory Mathis is in for his routine follow-up visit.  He completed his prednisone taper 2 days ago.  He is feeling well and has had no recurrence of fever or chills.  Review of Systems: Review of Systems  Constitutional: Negative for chills, diaphoresis, fever, malaise/fatigue and weight loss.  Skin: Negative for rash.    Past Medical History:  Diagnosis Date  . BPH (benign prostatic hyperplasia)   . Diverticulosis of colon   . GERD (gastroesophageal reflux disease)    prn med. control  . History of adenomatous polyp of colon    03-29-2007  tubular adenoma/  08-07-2016  hyperplastic and tubular adenoma's  . History of alcoholic gastritis    [id number]  gastritis, esophagitis, peptic duodenitis  . History of colonic diverticulitis    01-31-2016  resolved w/ medications  . History of Helicobacter pylori infection    10-30-2008  .  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 Internal hemorrhoids   . Memory loss   . OSA on CPAP    CPAP nightly  . Right thyroid nodule   . Urinary retention 12/13/2016    Social History   Tobacco Use  . Smoking status: Former Research scientist (life sciences)  . Smokeless tobacco: Never Used  Substance Use Topics  . Alcohol use: Yes    Comment: social   . Drug use: No    Family History  Problem Relation Age of Onset  . Heart attack Mother        88's  . Other Father        atherosclerosis - 3's  . Prostate cancer Maternal Grandfather   . Other Sister        brain tumor - unsure if it was cancer - 66's  . Colon cancer Neg Hx   . Esophageal cancer Neg Hx   . Pancreatic cancer Neg Hx   . Rectal cancer Neg Hx   . Stomach cancer Neg Hx     Allergies  Allergen Reactions  . Sulfa Antibiotics Hives  . Sulfasalazine Hives  . Flagyl [Metronidazole] Other (See Comments)    Pt reported he was light headed for 2 weeks.  . Other     Dark chocolate and mints - makes  him sneeze    Objective: Vitals:   11/08/17 0852  BP: (!) 145/75  Pulse: 66  Temp: 98.3 F (36.8 C)  TempSrc: Oral  Weight: 220 lb (99.8 kg)   Body mass index is 31.12 kg/m.  Physical Exam  Constitutional: He is oriented to person, place, and time.  He is in good spirits.  HENT:  Mouth/Throat: No oropharyngeal exudate.  Cardiovascular: Normal rate, regular rhythm and normal heart sounds.  Pulmonary/Chest: Effort normal and breath sounds normal.  Abdominal: Soft. He exhibits no distension. There is no tenderness.  Neurological: He is alert and oriented to person, place, and time.  Skin: No rash noted.    Lab Results    Problem List Items Addressed This Visit      High   FUO (fever of unknown origin)    The cause of his recurrent FUO remains uncertain.  He has had extensive evaluations on 2 previous occasions without any abnormalities found that would explain his  fevers.  Recent examinations have been unremarkable.  His sed rate and C-reactive protein were normal at the time of his last visit.  His CBC showed only slight elevation of his white blood count and viral panel was negative.  He will stay off of prednisone for now and follow-up in 1 month.          Michel Bickers, MD [facility] [phone] pager   [phone] cell 11/08/2017, 9:21 AM

## 2017-12-03 DIAGNOSIS — G4700 Insomnia, unspecified: Secondary | ICD-10-CM | POA: Diagnosis not present

## 2017-12-03 DIAGNOSIS — G4733 Obstructive sleep apnea (adult) (pediatric): Secondary | ICD-10-CM | POA: Diagnosis not present

## 2017-12-04 ENCOUNTER — Encounter: Payer: Self-pay | Admitting: Internal Medicine

## 2017-12-04 ENCOUNTER — Ambulatory Visit (INDEPENDENT_AMBULATORY_CARE_PROVIDER_SITE_OTHER): Payer: Medicare Other | Admitting: Internal Medicine

## 2017-12-04 VITALS — BP 140/74 | HR 66 | Ht 70.5 in | Wt 218.8 lb

## 2017-12-04 DIAGNOSIS — R002 Palpitations: Secondary | ICD-10-CM | POA: Diagnosis not present

## 2017-12-04 DIAGNOSIS — E785 Hyperlipidemia, unspecified: Secondary | ICD-10-CM | POA: Diagnosis not present

## 2017-12-04 DIAGNOSIS — R509 Fever, unspecified: Secondary | ICD-10-CM

## 2017-12-04 DIAGNOSIS — I10 Essential (primary) hypertension: Secondary | ICD-10-CM | POA: Diagnosis not present

## 2017-12-04 NOTE — Progress Notes (Signed)
Cardiology Office Note   Date:  12/04/2017   ID:  Cory Mathis, DOB 04-21-1951, MRN [id number]  PCP:  [name], MD  Cardiologist:   Dorris Carnes, MD   Pt presents fro f/u of HTN and BP   History of Present Illness: Cory Mathis is a 67 y.o. male who moved to [place] from [place] in 2016   He established with [facility]   He has a histroy of anxiety, HTN, RBBB, sleep apnea, GERD, palpitaitons, FUO (pt had x 2 with extensive workups) chest pain and HL   He is followed at [facility].   He has also been seen by Thurman Coyer   Wants to establish with another cardiologist.    The pat was first told BP was high in June 2018   Amlodipine started at that time  Seen by Thurman Coyer   Ca score was 168   REcomm statin   Lipiids in Feb 2019 LDL 86   HDL 39  Trig 104.     Pt has had recurrent fever, chills   Stopped amlodipine in February   In Feb started prdnisone and restarted amlodipine    Stopped amlodipine in 2/21  Fevers chills  I saw the pt in May   BP at that time was 120s to 140s  He comes in today with BP log   BP 120s to 140s   Majority in low 130s     Yesterday in [facility] doctors office was 132/64  He says breathing is OK  No CP       Current Meds  Medication Sig  . amLODipine (NORVASC) 5 MG tablet TK 1 T PO D  . omeprazole (PRILOSEC) 20 MG capsule Take 20 mg by mouth daily. Pt is taking 20 mg tablet daily over the counter as directed on the package.  . ranitidine (ZANTAC) 150 MG tablet Take 150 mg by mouth daily.   Marland Kitchen zolpidem (AMBIEN CR) 12.5 MG CR tablet Take 12.5 mg by mouth at bedtime as needed for sleep.     Allergies:   Sulfa antibiotics; Sulfasalazine; Flagyl [metronidazole]; and Other   Past Medical History:  Diagnosis Date  . BPH (benign prostatic hyperplasia)   . Diverticulosis of colon   . GERD (gastroesophageal reflux disease)    prn med. control  . History of adenomatous polyp of colon    03-29-2007  tubular adenoma/  08-07-2016  hyperplastic and tubular  adenoma's  . History of alcoholic gastritis    [id number]  gastritis, esophagitis, peptic duodenitis  . History of colonic diverticulitis    01-31-2016  resolved w/ medications  . History of Helicobacter pylori infection    10-30-2008  . 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 Internal hemorrhoids   . Memory loss   . OSA on CPAP    CPAP nightly  . Right thyroid nodule   . Urinary retention 12/13/2016    Past Surgical History:  Procedure Laterality Date  . COLONOSCOPY  last one 08-07-2016  . HEMORRHOID BANDING  10-20-2016;  09-11-2016  . HERNIA REPAIR    . KNEE ARTHROSCOPY Bilateral    10 + yrs ago  . LASIK Bilateral   . PILONIDAL CYST EXCISION    . THYROIDECTOMY Left 2013  . TRANSURETHRAL RESECTION OF PROSTATE N/A 01/04/2017   Procedure: TRANSURETHRAL RESECTION OF THE PROSTATE (TURP);  Surgeon: Franchot Gallo, MD;  Location: [facility];  Service: Urology;  Laterality: N/A;  . UPPER GASTROINTESTINAL ENDOSCOPY  10/30/2008  . WISDOM TOOTH EXTRACTION       Social History:  The patient  reports that he has quit smoking. He has never used smokeless tobacco. He reports that he drinks alcohol. He reports that he does not use drugs.   Family History:  The patient's family history includes Heart attack in his mother; Other in his father and sister; Prostate cancer in his maternal grandfather.    ROS:  Please see the history of present illness. All other systems are reviewed and  Negative to the above problem except as noted.    PHYSICAL EXAM: VS:  BP 140/74   Pulse 66   Ht 5' 10.5" (1.791 m)   Wt 99.2 kg (218 lb 12.8 oz)   SpO2 95%   BMI 30.95 kg/m   GEN: Well nourished, well developed, in no acute distress  HEENT: normal  Neck:  JVP is normal   No  , carotid bruits, or masses Cardiac: RRR; no murmurs, rubs, or gallops,no edema  Respiratory:  clear to auscultation bilaterally, normal work of  breathing GI: soft, nontender, nondistended, + BS  No hepatomegaly  MS: no deformity Moving all extremities   Skin: warm and dry, no rash Neuro:  Strength and sensation are intact Psych: euthymic mood, full affect   EKG:  EKG is not ordered today   Lipid Panel No results found for: CHOL, TRIG, HDL, CHOLHDL, VLDL, LDLCALC, LDLDIRECT    Wt Readings from Last 3 Encounters:  12/04/17 99.2 kg (218 lb 12.8 oz)  11/08/17 99.8 kg (220 lb)  10/12/17 101.8 kg (224 lb 6.4 oz)      ASSESSMENT AND PLAN:  1  HTN  BP has been labile  Pt brought in readings from home that he has taken over past few months 120s to 140s/ 60s to 80s   He stopped amlodipine for a few days then restarted.  2   HL   Pt with mild plaquing   Lipids are not bad   Pt is determined to lose wt   Will check lipiomed in fall  3  Palpitaitons   Denies   4  Hx of fevers   Pt is off of prednisone       Current medicines are reviewed at length with the patient today.  The patient does not have concerns regarding medicines.  Signed, Dorris Carnes, MD  12/04/2017 9:51 AM    [address], [place], [place]  [address] Phone: [phone]; Fax: [phone]

## 2017-12-04 NOTE — Patient Instructions (Signed)
Your physician recommends that you continue on your current medications as directed. Please refer to the Current Medication list given to you today. Your physician wants you to follow-up in: Nov with Dr. Harrington Challenger. You will receive a reminder letter in the mail two months in advance. If you don't receive a letter, please call our office to schedule the follow-up appointment. Your physician recommends that you return for lab work in: Nov, prior to next appointment with Dr. Harrington Challenger

## 2017-12-05 DIAGNOSIS — R3915 Urgency of urination: Secondary | ICD-10-CM | POA: Diagnosis not present

## 2017-12-05 DIAGNOSIS — N3942 Incontinence without sensory awareness: Secondary | ICD-10-CM | POA: Diagnosis not present

## 2017-12-05 DIAGNOSIS — R3912 Poor urinary stream: Secondary | ICD-10-CM | POA: Diagnosis not present

## 2017-12-05 DIAGNOSIS — N401 Enlarged prostate with lower urinary tract symptoms: Secondary | ICD-10-CM | POA: Diagnosis not present

## 2017-12-18 DIAGNOSIS — K5900 Constipation, unspecified: Secondary | ICD-10-CM | POA: Diagnosis not present

## 2017-12-25 ENCOUNTER — Ambulatory Visit: Payer: 59 | Admitting: Internal Medicine

## 2017-12-31 ENCOUNTER — Telehealth: Payer: Self-pay | Admitting: Internal Medicine

## 2017-12-31 NOTE — Telephone Encounter (Signed)
New Message:       Pt c/o medication issue:  1. Name of Medication: amLODipine (NORVASC) 5 MG tablet  2. How are you currently taking this medication (dosage and times per day)? TK 1 T PO D  3. Are you having a reaction (difficulty breathing--STAT)? No  4. What is your medication issue? Pt states he is having sour taste in his mouth and he wants to see if he can discontinue this medication to see if the taste in his mouth will go away.

## 2017-12-31 NOTE — Telephone Encounter (Signed)
Terrible metallic taste in mouth. Went on prilosec x 2 weeks, Saw PCP started Nexium for another 2 weeks. Does not have heartburn.  Metallic taste still there.  He read online could be coming from medication. Wonders if could be amlodipine.BP has been 120s/ 60s.  Pt also takes [place]. He would permission to stop amlodipine for 1-2 weeks to see if metallic taste improves. He will continue to monitor BP in the meantime and call back / MyChart with an update in 2 weeks at the latest.  He is aware I will forward to PharmD and Dr. Harrington Challenger for any input/recommendations.

## 2017-12-31 NOTE — Telephone Encounter (Signed)
I agree with stopping   Follow  Call back if BP goes high

## 2017-12-31 NOTE — Telephone Encounter (Signed)
This is not a reported side effect from amlodipine, but if he feels strongly it is the medication, it is reasonable to stop this medication. Would advise that he monitor closely and call. If elevated could consider Acei or ARB as alternative.

## 2018-01-10 ENCOUNTER — Encounter: Payer: Self-pay | Admitting: Physician Assistant

## 2018-01-10 ENCOUNTER — Other Ambulatory Visit: Payer: Medicare Other

## 2018-01-10 ENCOUNTER — Encounter

## 2018-01-10 ENCOUNTER — Ambulatory Visit (INDEPENDENT_AMBULATORY_CARE_PROVIDER_SITE_OTHER): Payer: Medicare Other | Admitting: Physician Assistant

## 2018-01-10 VITALS — BP 128/66 | HR 60 | Ht 70.5 in | Wt 209.0 lb

## 2018-01-10 DIAGNOSIS — K5900 Constipation, unspecified: Secondary | ICD-10-CM | POA: Diagnosis not present

## 2018-01-10 DIAGNOSIS — K2100 Gastro-esophageal reflux disease with esophagitis, without bleeding: Secondary | ICD-10-CM

## 2018-01-10 MED ORDER — PANTOPRAZOLE SODIUM 40 MG PO TBEC: 40.0000 mg | DELAYED_RELEASE_TABLET | Freq: Two times a day (BID) | ORAL | 3 refills | Status: DC

## 2018-01-10 MED ORDER — AMBULATORY NON FORMULARY MEDICATION: 0 refills | Status: DC

## 2018-01-10 NOTE — Patient Instructions (Addendum)
We have given you a handout on antireflux measures.   We have given you a high fiber diet handout. Please strive to have 25-30 grams of fiber daily.   Your provider suggest that you drink more water. Try to have at least 6-8 8 oz glasses of water daily.   Start Miralax once daily.   Your provider has requested that you go to the basement level for lab work before leaving today. Press "B" on the elevator. The lab is located at the first door on the left as you exit the elevator.  After you return your stool studies start Pantoprazole 40 mg twice a day 30-60 minutes before breakfast and dinner.   We have sent the following medications to your pharmacy for you to pick up at your convenience: GI cocktail 5-10 ml every 4 hours as needed for 5 days

## 2018-01-10 NOTE — Progress Notes (Signed)
Reviewed and agree with initial management plan.  Samik Balkcom T. Mattias Walmsley, MD FACG 

## 2018-01-10 NOTE — Progress Notes (Signed)
Chief Complaint: Constipation and reflux  HPI:    Cory Mathis a 67 year old male with a past medical history as listed below, assigned to Dr. Fuller Plan at last visit, who presents to clinic today with a complaint of constipation and reflux.    Office visit 04/28/2016 with me for complaint of diverticulitis at that time prescribed Cipro and Flagyl and discussed low fiber diet.  He also requested records from patient's last GI clinic in [place] to discern when he would be due for a colonoscopy.  His last colonoscopy was noted 03/28/2013 for personal history of nonadvanced adenoma and high risk colon cancer surveillance.  There was one small polyp, but we never received pathology. Also had EGD 10/30/2008 with erosions and ulcerations at the Z line and grade 2 esophagitis as well as a sliding hiatal hernia.  He was diagnosed with reflux esophagitis and gastritis and biopsies were positive for H. Pylori.    08/07/16 colonoscopy with 3 6-7 mm polyps in the transverse and ascending colon, internal hemorrhoids, moderate diverticulosis in the left colon otherwise normal.  Pathology showed tubular adenoma.  Repeat recommended 5 years.    Today, explains that since June he has had increased reflux and a "constant acid taste in my mouth".  Apparently he tried over-the-counter Prilosec for 2 weeks and then over-the-counter Nexium for 2 weeks but only had limited relief from this and has not used anything over the past month or so.  He tells me the taste is like "battery acid" and is constant.  Has been chewing on ginger and bubblegum in order to help with this.  Denies any burning sensation or epigastric pain.  Does take Zantac 150 mg daily which does not help with anything.  Tells me he does not know what to eat so has been mostly eating bland carbs.    Due to reflux has not been able to drink his morning coffee and has become constipated.  When he uses MiraLAX he is able to pass a "nothing to talk about" stool, but if he  does not use this laxative he does not have a bowel movement at all, sometimes for many days.  Associated symptoms include bloating and some lower abdominal pain.    Denies fever, chills, blood in his stool, melena, weight loss, anorexia or symptoms that awaken him from sleep.  Past Medical History:  Diagnosis Date  . BPH (benign prostatic hyperplasia)   . Diverticulosis of colon   . GERD (gastroesophageal reflux disease)    prn med. control  . History of adenomatous polyp of colon    03-29-2007  tubular adenoma/  08-07-2016  hyperplastic and tubular adenoma's  . History of alcoholic gastritis    [id number]  gastritis, esophagitis, peptic duodenitis  . History of colonic diverticulitis    01-31-2016  resolved w/ medications  . History of Helicobacter pylori infection    10-30-2008  . 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 Internal hemorrhoids   . Memory loss   . OSA on CPAP    CPAP nightly  . Right thyroid nodule   . Urinary retention 12/13/2016    Past Surgical History:  Procedure Laterality Date  . COLONOSCOPY  last one 08-07-2016  . HEMORRHOID BANDING  10-20-2016;  09-11-2016  . HERNIA REPAIR    . KNEE ARTHROSCOPY Bilateral    10 + yrs ago  . LASIK Bilateral   . PILONIDAL CYST EXCISION    .  THYROIDECTOMY Left 2013  . TRANSURETHRAL RESECTION OF PROSTATE N/A 01/04/2017   Procedure: TRANSURETHRAL RESECTION OF THE PROSTATE (TURP);  Surgeon: Franchot Gallo, MD;  Location: [facility];  Service: Urology;  Laterality: N/A;  . UPPER GASTROINTESTINAL ENDOSCOPY  10/30/2008  . WISDOM TOOTH EXTRACTION      Current Outpatient Medications  Medication Sig Dispense Refill  . amLODipine (NORVASC) 5 MG tablet TK 1 T PO D  3  . omeprazole (PRILOSEC) 20 MG capsule Take 20 mg by mouth daily. Pt is taking 20 mg tablet daily over the counter as directed on the package.    . ranitidine (ZANTAC) 150 MG tablet Take 150 mg  by mouth daily.     Marland Kitchen zolpidem (AMBIEN CR) 12.5 MG CR tablet Take 12.5 mg by mouth at bedtime as needed for sleep.     No current facility-administered medications for this visit.     Allergies as of 01/10/2018 - Review Complete 12/04/2017  Allergen Reaction Noted  . Sulfa antibiotics Hives 01/31/2016  . Sulfasalazine Hives 01/31/2016  . Flagyl [metronidazole] Other (See Comments) 08/07/2016  . Other  07/25/2017    Family History  Problem Relation Age of Onset  . Heart attack Mother        20's  . Other Father        atherosclerosis - 70's  . Prostate cancer Maternal Grandfather   . Other Sister        brain tumor - unsure if it was cancer - 6's  . Colon cancer Neg Hx   . Esophageal cancer Neg Hx   . Pancreatic cancer Neg Hx   . Rectal cancer Neg Hx   . Stomach cancer Neg Hx     Social History   Socioeconomic History  . Marital status: Married    Spouse name: Not on file  . Number of children: 0  . Years of education: 16+  . Highest education level: Bachelor's degree (e.g., BA, AB, BS)  Occupational History  . Occupation: Retired  Scientific laboratory technician  . Financial resource strain: Not on file  . Food insecurity:    Worry: Not on file    Inability: Not on file  . Transportation needs:    Medical: Not on file    Non-medical: Not on file  Tobacco Use  . Smoking status: Former Research scientist (life sciences)  . Smokeless tobacco: Never Used  Substance and Sexual Activity  . Alcohol use: Yes    Comment: social   . Drug use: No  . Sexual activity: Not on file  Lifestyle  . Physical activity:    Days per week: Not on file    Minutes per session: Not on file  . Stress: Not on file  Relationships  . Social connections:    Talks on phone: Not on file    Gets together: Not on file    Attends religious service: Not on file    Active member of club or organization: Not on file    Attends meetings of clubs or organizations: Not on file    Relationship status: Not on file  . Intimate partner  violence:    Fear of current or ex partner: Not on file    Emotionally abused: Not on file    Physically abused: Not on file    Forced sexual activity: Not on file  Other Topics Concern  . Not on file  Social History Narrative   Left-handed.   1.5 cups caffeine per day.  Review of Systems:    Constitutional: No weight loss, fever or chills Cardiovascular: No chest pain   Respiratory: No SOB  Gastrointestinal: See HPI and otherwise negative   Physical Exam:  Vital signs: BP 128/66   Pulse 60   Ht 5' 10.5" (1.791 m)   Wt 209 lb (94.8 kg)   BMI 29.56 kg/m   Constitutional:   Pleasant Caucasian male appears to be in NAD, Well developed, Well nourished, alert and cooperative Respiratory: Respirations even and unlabored. Lungs clear to auscultation bilaterally.   No wheezes, crackles, or rhonchi.  Cardiovascular: Normal S1, S2. No MRG. Regular rate and rhythm. No peripheral edema, cyanosis or pallor.  Gastrointestinal:  Soft, nondistended, nontender. No rebound or guarding. Normal bowel sounds. No appreciable masses or hepatomegaly. Psychiatric: Demonstrates good judgement and reason without abnormal affect or behaviors.  No recent labs or imaging.  Assessment: 1.  GERD: "Acid taste" in patient's mouth, no epigastric pain or real burning, slight relief with over-the-counter Prilosec/Nexium, now only on Zantac, history of reflux esophagitis in the past as well as H. Pylori; consider H. pylori versus gastritis 2.  Constipation: Worse since start of reflux, patient cannot drink coffee which typically worked for him, likely related to altered diet.  Plan: 1.  Ordered H. pylori fecal antigen testing.  Patient has had H. pylori in the past. 2.  After patient has submitted fecal test, he can start Pantoprazole 40 mg twice daily, 30-60 minutes before breakfast and dinner #60 with 3 refills 3.  Reviewed antireflux diet.  Provided him with a handout. 4.  Reviewed high-fiber diet,  recommend 25-35 g/day. 5.  Continue water intake at least 6-8 8 ounce glasses per day. 6.  Recommend he use MiraLAX on a daily basis for now.  Discussed that he can titrate this. 7.  Prescribed GI cocktail 5-10 mL's every 4 hours as needed over the next few days. 8.  Patient will call our clinic in 2 weeks if he is not had any relief of symptoms. 9.  Patient follow clinic with me in 4 weeks or sooner if necessary.  Ellouise Newer, PA-C [facility] 01/10/2018, 8:55 AM  Cc: [name], MD

## 2018-01-11 ENCOUNTER — Telehealth: Payer: Self-pay | Admitting: Internal Medicine

## 2018-01-11 ENCOUNTER — Other Ambulatory Visit: Payer: Medicare Other

## 2018-01-11 DIAGNOSIS — K5900 Constipation, unspecified: Secondary | ICD-10-CM

## 2018-01-11 DIAGNOSIS — K2100 Gastro-esophageal reflux disease with esophagitis, without bleeding: Secondary | ICD-10-CM

## 2018-01-11 NOTE — Telephone Encounter (Signed)
Patient had stopped taking amlodipine because he has a metallic taste and thought may be due to this medication. Monitoring BP. Still has metallic taste. BP checked daily has been mid 120s - mid 60s. He wants to know if he even needs to be on the medicine.  I adv to stay off amlodipine for now and continue to monitor BP. Adv to call back if BP is  >135 or >80 consistently. Adv I will route to Dr. Harrington Challenger to inform and will call him back if she has more recommendations. Pt in agreement with this plan.  He has next follow up with Dr. Harrington Challenger on 05/20/18.

## 2018-01-11 NOTE — Telephone Encounter (Signed)
New Message ° ° ° ° ° ° ° ° ° °Patient returned your call °

## 2018-01-12 NOTE — Telephone Encounter (Signed)
Agree with recommendations.  

## 2018-01-14 ENCOUNTER — Encounter: Payer: Self-pay | Admitting: Internal Medicine

## 2018-01-14 LAB — HELICOBACTER PYLORI  SPECIAL ANTIGEN
MICRO NUMBER:: 90916351
SPECIMEN QUALITY: ADEQUATE

## 2018-01-16 ENCOUNTER — Telehealth: Payer: Self-pay | Admitting: Internal Medicine

## 2018-01-16 DIAGNOSIS — R001 Bradycardia, unspecified: Secondary | ICD-10-CM

## 2018-01-16 NOTE — Telephone Encounter (Signed)
New Message:       This message was sent through patient messages: Patient states: Resting pulse in mid to hi 40s. As low as 45. Not on any medication. I have scheduled the patient with Phylliss Bob on 9/6.

## 2018-01-16 NOTE — Telephone Encounter (Signed)
Spoke to patient who is having bradycardic heart rates.  His HR usually is in the 60s, but lately at rest while watching television.it has been in the mid to upper 40s.  He is asymptomatic (no CP, SOB or dizziness).  Please advise, thank you.  He has been scheduled for an OV by our schedulers for 9/6 with Phylliss Bob.

## 2018-01-16 NOTE — Telephone Encounter (Signed)
Patient in the shower according to wife.  She will have him call when available.

## 2018-01-17 NOTE — Telephone Encounter (Signed)
That is OK HR can go up and down If not dizzy, not SOB no CP would follow. Call if develops any of above.   Note:   Super hot showers can make many people dizzy, esp if HR slower

## 2018-01-18 ENCOUNTER — Ambulatory Visit (INDEPENDENT_AMBULATORY_CARE_PROVIDER_SITE_OTHER): Payer: Medicare Other

## 2018-01-18 DIAGNOSIS — R001 Bradycardia, unspecified: Secondary | ICD-10-CM | POA: Diagnosis not present

## 2018-01-18 NOTE — Telephone Encounter (Signed)
Called patient and informed. He will come today for 4 pm appointment. Explained that he should keep track of any symptoms (diary entries or trigger monitor) Pt verbalizes understanding and appreciation for this plan.

## 2018-01-18 NOTE — Telephone Encounter (Signed)
Reviewed and tried to reassure patient that HR is OK going up and down as long as pt is not dizzy, sob, or having chest pain and to continue to follow hr, call if above symptoms.    Pt states that he feels lightheaded while sitting still and notices HR 44.  "I am lightheaded but can still function".  HR up to 60 when starts moving around.  On elliptical HR goes to around 115 and quickly recovers to about 80.    Last EKG shows HR 60 with RBBB.  Pt wants to make sure Dr. Harrington Challenger is aware that resting HR is down by at least 10 bpm and he has not changed anything and this continues to concern him.  Also his maximum HR with exercise is also lower.  He wants to know if he should wear a monitor or if Dr. Harrington Challenger wants to see him.  He is aware that I will send message to Dr. Harrington Challenger and we will call hm back.

## 2018-01-18 NOTE — Telephone Encounter (Signed)
Set pt up for a 48 hour holter monitor to document    He should trigger if symptomatic

## 2018-01-24 ENCOUNTER — Encounter: Payer: Self-pay | Admitting: Cardiology

## 2018-01-25 ENCOUNTER — Other Ambulatory Visit: Payer: Self-pay | Admitting: *Deleted

## 2018-01-25 ENCOUNTER — Other Ambulatory Visit: Payer: Medicare Other | Admitting: *Deleted

## 2018-01-25 DIAGNOSIS — R002 Palpitations: Secondary | ICD-10-CM

## 2018-01-25 DIAGNOSIS — R001 Bradycardia, unspecified: Secondary | ICD-10-CM

## 2018-01-26 LAB — TSH: TSH: 1.2 u[IU]/mL (ref 0.450–4.500)

## 2018-02-07 ENCOUNTER — Encounter: Payer: Self-pay | Admitting: Physician Assistant

## 2018-02-07 ENCOUNTER — Ambulatory Visit (INDEPENDENT_AMBULATORY_CARE_PROVIDER_SITE_OTHER): Payer: Medicare Other | Admitting: Physician Assistant

## 2018-02-07 VITALS — BP 144/70 | HR 63 | Ht 70.5 in | Wt 211.1 lb

## 2018-02-07 DIAGNOSIS — K5900 Constipation, unspecified: Secondary | ICD-10-CM

## 2018-02-07 DIAGNOSIS — K219 Gastro-esophageal reflux disease without esophagitis: Secondary | ICD-10-CM

## 2018-02-07 NOTE — Progress Notes (Signed)
Chief Complaint: GERD and Constipation  HPI:    Cory Mathis is a 67 year old male with a past medical history as listed below, assigned to Dr. Fuller Plan, who presents to clinic today for follow-up of constipation and reflux.    01/10/2018 office visit had described increased reflux since June, also described constipation which he thought was related to not being able to drink his morning coffee because of his reflux.  At that time ordered H. pylori fecal antigen testing and recommend starting Pantoprazole 40 mg twice daily after submitting a fecal test.  Discussed high-fiber diet increased water and MiraLAX.  Also prescribed GI cocktail as needed.  H. pylori fecal antigen returned negative.    Today, patient reminds me that his primary complaint at last visit was a "taste of battery acid in my mouth".  He tells me now it is maybe "40% of what it was".  He has continued on pantoprazole 40 mg twice daily and did use his GI cocktail for the first week.  He did stop his Zantac as he was unsure if he should be taking these medications together.  Denies abdominal pain.  Tells me that chewing gum seems to help increase saliva flow and decrease the bad taste which seems to be coming from "the front of my mouth now".    Constipation is resolved with an increase in fiber and water.    Denies fever, chills, blood in his stool, melena, weight loss, anorexia, nausea or vomiting.  Past Medical History:  Diagnosis Date  . BPH (benign prostatic hyperplasia)   . Diverticulosis of colon   . GERD (gastroesophageal reflux disease)    prn med. control  . History of adenomatous polyp of colon    03-29-2007  tubular adenoma/  08-07-2016  hyperplastic and tubular adenoma's  . History of alcoholic gastritis    [id number]  gastritis, esophagitis, peptic duodenitis  . History of colonic diverticulitis    01-31-2016  resolved w/ medications  . History of Helicobacter pylori infection    10-30-2008  . 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 Internal hemorrhoids   . Memory loss   . OSA on CPAP    CPAP nightly  . Right thyroid nodule   . Urinary retention 12/13/2016    Past Surgical History:  Procedure Laterality Date  . COLONOSCOPY  last one 08-07-2016  . HEMORRHOID BANDING  10-20-2016;  09-11-2016  . HERNIA REPAIR    . KNEE ARTHROSCOPY Bilateral    10 + yrs ago  . LASIK Bilateral   . PILONIDAL CYST EXCISION    . THYROIDECTOMY Left 2013  . TRANSURETHRAL RESECTION OF PROSTATE N/A 01/04/2017   Procedure: TRANSURETHRAL RESECTION OF THE PROSTATE (TURP);  Surgeon: Franchot Gallo, MD;  Location: [facility];  Service: Urology;  Laterality: N/A;  . UPPER GASTROINTESTINAL ENDOSCOPY  10/30/2008  . WISDOM TOOTH EXTRACTION      Current Outpatient Medications  Medication Sig Dispense Refill  . AMBULATORY NON FORMULARY MEDICATION Medication Name: Gi cocktail 5-10 mL every 4 hours as needed. 470 mL 0  . pantoprazole (PROTONIX) 40 MG tablet Take 1 tablet (40 mg total) by mouth 2 (two) times daily before a meal. 60 tablet 3  . ranitidine (ZANTAC) 150 MG tablet Take 150 mg by mouth daily.     Marland Kitchen zolpidem (AMBIEN CR) 12.5 MG CR tablet Take 12.5 mg by mouth at bedtime as needed for sleep.  No current facility-administered medications for this visit.     Allergies as of 02/07/2018 - Review Complete 01/10/2018  Allergen Reaction Noted  . Sulfa antibiotics Hives 01/31/2016  . Sulfasalazine Hives 01/31/2016  . Flagyl [metronidazole] Other (See Comments) 08/07/2016  . Other  07/25/2017    Family History  Problem Relation Age of Onset  . Heart attack Mother        82's  . Other Father        atherosclerosis - 45's  . Prostate cancer Maternal Grandfather   . Other Sister        brain tumor - unsure if it was cancer - 80's  . Colon cancer Neg Hx   . Esophageal cancer Neg Hx   . Pancreatic cancer Neg Hx   . Rectal cancer Neg Hx   .  Stomach cancer Neg Hx     Social History   Socioeconomic History  . Marital status: Married    Spouse name: Not on file  . Number of children: 0  . Years of education: 16+  . Highest education level: Bachelor's degree (e.g., BA, AB, BS)  Occupational History  . Occupation: Retired  Scientific laboratory technician  . Financial resource strain: Not on file  . Food insecurity:    Worry: Not on file    Inability: Not on file  . Transportation needs:    Medical: Not on file    Non-medical: Not on file  Tobacco Use  . Smoking status: Former Research scientist (life sciences)  . Smokeless tobacco: Never Used  Substance and Sexual Activity  . Alcohol use: Yes    Comment: social   . Drug use: No  . Sexual activity: Not on file  Lifestyle  . Physical activity:    Days per week: Not on file    Minutes per session: Not on file  . Stress: Not on file  Relationships  . Social connections:    Talks on phone: Not on file    Gets together: Not on file    Attends religious service: Not on file    Active member of club or organization: Not on file    Attends meetings of clubs or organizations: Not on file    Relationship status: Not on file  . Intimate partner violence:    Fear of current or ex partner: Not on file    Emotionally abused: Not on file    Physically abused: Not on file    Forced sexual activity: Not on file  Other Topics Concern  . Not on file  Social History Narrative   Left-handed.   1.5 cups caffeine per day.    Review of Systems:    Constitutional: No weight loss, fever or chills Cardiovascular: No chest pain Respiratory: No SOB  Gastrointestinal: See HPI and otherwise negative   Physical Exam:  Vital signs: BP (!) 144/70   Pulse 63   Ht 5' 10.5" (1.791 m)   Wt 211 lb 2 oz (95.8 kg)   BMI 29.87 kg/m   Constitutional:   Pleasant Caucasian male appears to be in NAD, Well developed, Well nourished, alert and cooperative Respiratory: Respirations even and unlabored. Lungs clear to auscultation  bilaterally.   No wheezes, crackles, or rhonchi.  Cardiovascular: Normal S1, S2. No MRG. Regular rate and rhythm. No peripheral edema, cyanosis or pallor.  Gastrointestinal:  Soft, nondistended, nontender. No rebound or guarding. Normal bowel sounds. No appreciable masses or hepatomegaly. Psychiatric: Demonstrates good judgement and reason without abnormal affect or behaviors.  See HPI for recent labs.   Assessment: 1.  GERD: Continues to taste acid in his mouth, this is maybe "40% of what it was", after a month of Pantoprazole 40 mg twice daily; consider PUD versus ZE versus other 2.  Constipation: Controlled now with increase in fiber and water  Plan: 1.  Scheduled patient for a diagnostic EGD with Dr. Fuller Plan in [facility] due to continued symptoms after a month of high-dose PPI.  Did discuss risk, benefits, limitations and alternatives and patient agrees to proceed. 2.  Continue Pantoprazole 40 mg twice daily, 30-60 minutes before breakfast and dinner. 3.  Start Zantac 75 mg twice daily, every morning and nightly 4.  Reviewed antireflux diet and lifestyle modifications. 5.  Patient to follow in clinic per recommendations from Dr. Fuller Plan after time of procedure.  Cory Newer, PA-C [facility] 02/07/2018, 9:21 AM  Cc: [name], MD

## 2018-02-07 NOTE — Progress Notes (Signed)
Reviewed and agree with initial management plan.  Celia Friedland T. Matyas Baisley, MD FACG 

## 2018-02-07 NOTE — Patient Instructions (Signed)
If you are age 68 or older, your body mass index should be between 23-30. Your Body mass index is 29.87 kg/m. If this is out of the aforementioned range listed, please consider follow up with your Primary Care Provider.  If you are age 84 or younger, your body mass index should be between 19-25. Your Body mass index is 29.87 kg/m. If this is out of the aformentioned range listed, please consider follow up with your Primary Care Provider.   You have been scheduled for an endoscopy. Please follow written instructions given to you at your visit today. If you use inhalers (even only as needed), please bring them with you on the day of your procedure. Your physician has requested that you go to www.startemmi.com and enter the access code given to you at your visit today. This web site gives a general overview about your procedure. However, you should still follow specific instructions given to you by our office regarding your preparation for the procedure.  Continue Pantoprazole 40 mg twice daily.  Start Zantac 75 mg every morning and at bedtime.  Thank you for choosing me and [facility].   Ellouise Newer, PA-C

## 2018-02-12 ENCOUNTER — Other Ambulatory Visit: Payer: Self-pay | Admitting: Otolaryngology

## 2018-02-12 DIAGNOSIS — E041 Nontoxic single thyroid nodule: Secondary | ICD-10-CM

## 2018-02-13 ENCOUNTER — Encounter: Payer: Self-pay | Admitting: Gastroenterology

## 2018-02-13 ENCOUNTER — Ambulatory Visit (AMBULATORY_SURGERY_CENTER): Payer: Medicare Other | Admitting: Gastroenterology

## 2018-02-13 VITALS — BP 134/67 | HR 49 | Temp 98.0°F | Resp 9 | Ht 70.0 in | Wt 211.0 lb

## 2018-02-13 DIAGNOSIS — K219 Gastro-esophageal reflux disease without esophagitis: Secondary | ICD-10-CM

## 2018-02-13 MED ORDER — SODIUM CHLORIDE 0.9 % IV SOLN: 500.0000 mL | Freq: Once | INTRAVENOUS | Status: DC

## 2018-02-13 NOTE — Progress Notes (Cosign Needed)
To PACU, VSS. Report to [email or web address] 

## 2018-02-13 NOTE — Patient Instructions (Signed)
**   Handouts given on hiatal hernia and gastritis **   DECREASE PANTOPRAZOLE TO 40MG  BY MOUTH EVERY MORNING  DISCONTINUE RANITIDINE!    YOU HAD AN ENDOSCOPIC PROCEDURE TODAY AT [facility]:   Refer to the procedure report that was given to you for any specific questions about what was found during the examination.  If the procedure report does not answer your questions, please call your gastroenterologist to clarify.  If you requested that your care partner not be given the details of your procedure findings, then the procedure report has been included in a sealed envelope for you to review at your convenience later.  YOU SHOULD EXPECT: Some feelings of bloating in the abdomen. Passage of more gas than usual.  Walking can help get rid of the air that was put into your GI tract during the procedure and reduce the bloating. If you had a lower endoscopy (such as a colonoscopy or flexible sigmoidoscopy) you may notice spotting of blood in your stool or on the toilet paper. If you underwent a bowel prep for your procedure, you may not have a normal bowel movement for a few days.  Please Note:  You might notice some irritation and congestion in your nose or some drainage.  This is from the oxygen used during your procedure.  There is no need for concern and it should clear up in a day or so.  SYMPTOMS TO REPORT IMMEDIATELY:   Following upper endoscopy (EGD)  Vomiting of blood or coffee ground material  New chest pain or pain under the shoulder blades  Painful or persistently difficult swallowing  New shortness of breath  Fever of 100F or higher  Black, tarry-looking stools  For urgent or emergent issues, a gastroenterologist can be reached at any hour by calling [phone].   DIET:  We do recommend a small meal at first, but then you may proceed to your regular diet.  Drink plenty of fluids but you should avoid alcoholic beverages for 24 hours.  ACTIVITY:  You should plan  to take it easy for the rest of today and you should NOT DRIVE or use heavy machinery until tomorrow (because of the sedation medicines used during the test).    FOLLOW UP: Our staff will call the number listed on your records the next business day following your procedure to check on you and address any questions or concerns that you may have regarding the information given to you following your procedure. If we do not reach you, we will leave a message.  However, if you are feeling well and you are not experiencing any problems, there is no need to return our call.  We will assume that you have returned to your regular daily activities without incident.  If any biopsies were taken you will be contacted by phone or by letter within the next 1-3 weeks.  Please call us at [phone] if you have not heard about the biopsies in 3 weeks.    SIGNATURES/CONFIDENTIALITY: You and/or your care partner have signed paperwork which will be entered into your electronic medical record.  These signatures attest to the fact that that the information above on your After Visit Summary has been reviewed and is understood.  Full responsibility of the confidentiality of this discharge information lies with you and/or your care-partner.

## 2018-02-13 NOTE — Progress Notes (Signed)
Called to room to assist during endoscopic procedure.  Patient ID and intended procedure confirmed with present staff. Received instructions for my participation in the procedure from the performing physician.  

## 2018-02-13 NOTE — Op Note (Signed)
[facility] Patient Name: Cory Mathis Procedure Date: 02/13/2018 11:04 AM MRN: [id number] Endoscopist: Ladene Artist , MD Age: 67 Referring MD:  Date of Birth: Feb 23, 1951 Gender: Male Account #: [id number] Procedure:                Upper GI endoscopy Indications:              Suspected gastroesophageal reflux disease Medicines:                Monitored Anesthesia Care Procedure:                Pre-Anesthesia Assessment:                           - Prior to the procedure, a History and Physical                            was performed, and patient medications and                            allergies were reviewed. The patient's tolerance of                            previous anesthesia was also reviewed. The risks                            and benefits of the procedure and the sedation                            options and risks were discussed with the patient.                            All questions were answered, and informed consent                            was obtained. Prior Anticoagulants: The patient has                            taken no previous anticoagulant or antiplatelet                            agents. ASA Grade Assessment: II - A patient with                            mild systemic disease. After reviewing the risks                            and benefits, the patient was deemed in                            satisfactory condition to undergo the procedure.                           After obtaining informed consent, the endoscope was  passed under direct vision. Throughout the                            procedure, the patient's blood pressure, pulse, and                            oxygen saturations were monitored continuously. The                            Endoscope was introduced through the mouth, and                            advanced to the second part of duodenum. The upper                            GI endoscopy was  accomplished without difficulty.                            The patient tolerated the procedure well. Scope In: Scope Out: Findings:                 The examined esophagus was normal.                           Patchy mildly erythematous mucosa without bleeding                            was found in the gastric body. Biopsies were taken                            with a cold forceps for histology.                           A small hiatal hernia was present.                           The exam of the stomach was otherwise normal.                           The duodenal bulb and second portion of the                            duodenum were normal. Complications:            No immediate complications. Estimated Blood Loss:     Estimated blood loss was minimal. Impression:               - Normal esophagus.                           - Erythematous mucosa in the gastric body. Biopsied.                           - Small hiatal hernia.                           -  Normal duodenal bulb and second portion of the                            duodenum. Recommendation:           - Patient has a contact number available for                            emergencies. The signs and symptoms of potential                            delayed complications were discussed with the                            patient. Return to normal activities tomorrow.                            Written discharge instructions were provided to the                            patient.                           - Resume previous diet.                           - Continue present medications.                           - Decrease pantoprazole to 40 mg po qam.                           - Discontinue raniditine.                           - Await pathology results.                           - Refer to an ENT specialist at appointment to be                            scheduled. Ladene Artist, MD 02/13/2018 11:40:24 AM This report has  been signed electronically.

## 2018-02-14 ENCOUNTER — Telehealth: Payer: Self-pay | Admitting: *Deleted

## 2018-02-14 DIAGNOSIS — J324 Chronic pansinusitis: Secondary | ICD-10-CM | POA: Diagnosis not present

## 2018-02-14 NOTE — Telephone Encounter (Signed)
  Follow up Call-  Call back number 02/13/2018 08/07/2016  Post procedure Call Back phone  # [phone] hm  Permission to leave phone message Yes Yes  Some recent data might be hidden     Patient questions:  Do you have a fever, pain , or abdominal swelling? Yes.  same as before procedure Pain Score  2 *  Have you tolerated food without any problems? Yes.    Have you been able to return to your normal activities? Yes.    Do you have any questions about your discharge instructions: Diet   No. Medications  No. Follow up visit  No.  Do you have questions or concerns about your Care? No.  Actions: * If pain score is 4 or above: No action needed, pain <4.

## 2018-02-15 ENCOUNTER — Encounter: Payer: Self-pay | Admitting: Cardiology

## 2018-02-15 ENCOUNTER — Ambulatory Visit (INDEPENDENT_AMBULATORY_CARE_PROVIDER_SITE_OTHER): Payer: Medicare Other | Admitting: Cardiology

## 2018-02-15 VITALS — BP 184/102 | HR 57 | Ht 70.0 in | Wt 213.4 lb

## 2018-02-15 DIAGNOSIS — G4733 Obstructive sleep apnea (adult) (pediatric): Secondary | ICD-10-CM

## 2018-02-15 DIAGNOSIS — R001 Bradycardia, unspecified: Secondary | ICD-10-CM

## 2018-02-15 DIAGNOSIS — I10 Essential (primary) hypertension: Secondary | ICD-10-CM | POA: Diagnosis not present

## 2018-02-15 DIAGNOSIS — R931 Abnormal findings on diagnostic imaging of heart and coronary circulation: Secondary | ICD-10-CM | POA: Diagnosis not present

## 2018-02-15 DIAGNOSIS — E785 Hyperlipidemia, unspecified: Secondary | ICD-10-CM | POA: Diagnosis not present

## 2018-02-15 MED ORDER — AMLODIPINE BESYLATE 5 MG PO TABS: 5.0000 mg | ORAL_TABLET | Freq: Every day | ORAL | 3 refills | Status: DC

## 2018-02-15 NOTE — Progress Notes (Signed)
Cardiology Office Note:    Date:  02/15/2018   ID:  Cory Mathis, DOB 08/13/1950, MRN [id number]  PCP:  [name], MD  Cardiologist:  Dorris Carnes, MD  Referring MD: [name], MD   Chief Complaint  Patient presents with  . Bradycardia    History of Present Illness:    Cory Mathis is a 67 y.o. male with a past medical history significant for anxiety, hypertension, RBBB, sleep apnea, GERD, palpitations, coronary calcium score of 168, hyperlipidemia.  He was last seen by Dr. Harrington Challenger on 12/04/2017 at which time he was noted to have labile blood pressures by home log.  He had stopped his amlodipine for a few days and then restarted it.  Since that time he called into the office with concerns of low heart rate.  TSH normal range at 1.200 Holter monitor with SR 36-90 bpm, avg HR 51 bpm. Longest pause 1.7 sec, rare APC, short burst of PAT, rare PVC  He is here today for follow-up of his low heart rate. He notes his HR down to the 50's and occ 40's at rest. He has not traditionally had a low heart rate until recently. 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 He uses his elliptaical at high level of intensity for 30 minutes on most days with HR  Up to 112-120, with quick recovery. He has occ lightheadedness but he cannot tell me details about it. It does not slow him down. He has not had syncope. He denies chest pain/pressure/tightness or dyspnea with any activities. No orthopnea, PND, edema. Uses CPAP every night.   He has been off amlodipine for about a month. He was having a bad taste in his mouth and thought it might be the amlodipine. His taste did not get any better. He had and EGD and then was started on an antibiotic and nasal spray by ENT for poss sinus issue.   Home BPs per log: 130's-140's/60's-70's after stopping amlodipine.   Past Medical History:  Diagnosis Date  . BPH (benign prostatic hyperplasia)   . Diverticulosis  of colon   . GERD (gastroesophageal reflux disease)    prn med. control  . History of adenomatous polyp of colon    03-29-2007  tubular adenoma/  08-07-2016  hyperplastic and tubular adenoma's  . History of alcoholic gastritis    [id number]  gastritis, esophagitis, peptic duodenitis  . History of colonic diverticulitis    01-31-2016  resolved w/ medications  . History of Helicobacter pylori infection    10-30-2008  . 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 Internal hemorrhoids   . Memory loss   . OSA on CPAP    CPAP nightly  . Right thyroid nodule   . Urinary retention 12/13/2016    Past Surgical History:  Procedure Laterality Date  . COLONOSCOPY  last one 08-07-2016  . HEMORRHOID BANDING  10-20-2016;  09-11-2016  . HERNIA REPAIR    . KNEE ARTHROSCOPY Bilateral    10 + yrs ago  . LASIK Bilateral   . PILONIDAL CYST EXCISION    . THYROIDECTOMY Left 2013  . TRANSURETHRAL RESECTION OF PROSTATE N/A 01/04/2017   Procedure: TRANSURETHRAL RESECTION OF THE PROSTATE (TURP);  Surgeon: Franchot Gallo, MD;  Location: [facility];  Service: Urology;  Laterality: N/A;  . UPPER GASTROINTESTINAL ENDOSCOPY  10/30/2008  . WISDOM TOOTH EXTRACTION  Current Medications: Current Meds  Medication Sig  . amoxicillin-clavulanate (AUGMENTIN) 875-125 MG tablet Take 1 tablet by mouth 2 (two) times daily.  . mometasone (NASONEX) 50 MCG/ACT nasal spray Place 2 sprays into the nose daily.  . pantoprazole (PROTONIX) 40 MG tablet Take 1 tablet (40 mg total) by mouth 2 (two) times daily before a meal. (Patient taking differently: Take 40 mg by mouth daily. )  . zolpidem (AMBIEN CR) 12.5 MG CR tablet Take 12.5 mg by mouth at bedtime as needed for sleep.     Allergies:   Sulfa antibiotics; Sulfasalazine; Flagyl [metronidazole]; and Other   Social History   Socioeconomic History  . Marital status: Married    Spouse name: Not on  file  . Number of children: 0  . Years of education: 16+  . Highest education level: Bachelor's degree (e.g., BA, AB, BS)  Occupational History  . Occupation: Retired  Scientific laboratory technician  . Financial resource strain: Not on file  . Food insecurity:    Worry: Not on file    Inability: Not on file  . Transportation needs:    Medical: Not on file    Non-medical: Not on file  Tobacco Use  . Smoking status: Former Research scientist (life sciences)  . Smokeless tobacco: Never Used  Substance and Sexual Activity  . Alcohol use: Yes    Comment: social   . Drug use: No  . Sexual activity: Not on file  Lifestyle  . Physical activity:    Days per week: Not on file    Minutes per session: Not on file  . Stress: Not on file  Relationships  . Social connections:    Talks on phone: Not on file    Gets together: Not on file    Attends religious service: Not on file    Active member of club or organization: Not on file    Attends meetings of clubs or organizations: Not on file    Relationship status: Not on file  Other Topics Concern  . Not on file  Social History Narrative   Left-handed.   1.5 cups caffeine per day.     Family History: The patient's family history includes Heart attack in his mother; Other in his father and sister; Prostate cancer in his maternal grandfather. There is no history of Colon cancer, Esophageal cancer, Pancreatic cancer, Rectal cancer, or Stomach cancer. ROS:   Please see the history of present illness.     All other systems reviewed and are negative.  EKGs/Labs/Other Studies Reviewed:    The following studies were reviewed today:  Holter Monitor 01/18/2018 SInus rhythm  36 to 90 bpm   Average HR 51 bpm Longest pause 1.7 sec Rare PAC  Short burst PAT   Rare PVC  CT calcium scoring 04/19/2017 IMPRESSION: 1. LAD and RCA coronary calcifications 2. Total Agatston Score: 186 3. MESA database percentile:  35 st 4. Small LEFT upper lobe pulmonary nodule favored benign. No follow-up  needed if patient is low-risk. Non-contrast chest CT can be considered in 12 months if patient is high-risk. This recommendation follows the consensus statement: Guidelines for Management of Incidental Pulmonary Nodules Detected on CT Images: From the Fleischner Society 2017; Radiology 2017; 284:228-243.  EKG:  EKG is  ordered today.  The ekg ordered today demonstrates sinus bradycardia at 57 bpm, RBBB. No significant changes.  Recent Labs: 09/27/2017: ALT 17; BUN 16; Creat 0.86; Hemoglobin 14.4; Platelets 217; Potassium 4.2; Sodium 139 01/25/2018: TSH 1.200   Recent Lipid Panel  No results found for: CHOL, TRIG, HDL, CHOLHDL, VLDL, LDLCALC, LDLDIRECT  Physical Exam:    VS:  BP (!) 184/102   Pulse (!) 57   Ht 5\' 10"  (1.778 m)   Wt 213 lb 6.4 oz (96.8 kg)   SpO2 93%   BMI 30.62 kg/m     Wt Readings from Last 3 Encounters:  02/15/18 213 lb 6.4 oz (96.8 kg)  02/13/18 211 lb (95.7 kg)  02/07/18 211 lb 2 oz (95.8 kg)     Physical Exam  Constitutional: He is oriented to person, place, and time. He appears well-developed and well-nourished. No distress.  HENT:  Head: Normocephalic and atraumatic.  Neck: Normal range of motion. Neck supple. No JVD present.  Cardiovascular: Normal rate, regular rhythm, normal heart sounds and intact distal pulses. Exam reveals no gallop and no friction rub.  No murmur heard. Pulmonary/Chest: Effort normal and breath sounds normal. No respiratory distress. He has no wheezes. He has no rales.  Abdominal: Soft. Bowel sounds are normal.  Musculoskeletal: Normal range of motion. He exhibits no edema or deformity.  Neurological: He is alert and oriented to person, place, and time.  Skin: Skin is warm and dry.  Psychiatric: He has a normal mood and affect. His behavior is normal. Judgment and thought content normal.  Vitals reviewed.   ASSESSMENT:    1. Essential hypertension   2. Bradycardia   3. Dyslipidemia   4. Agatston coronary artery calcium  score between 100 and 199   5. Obstructive sleep apnea    PLAN:    In order of problems listed above:  Bradycardia: rates down to 50's and occ 40's at rest. Comes up nicely with exercise. He is very active with no adverse symptoms. No syncope or activity intolerance. Holter showed no significant pauses, avg HR 55, range 36 to 90. No evidence of heart block. Not on any rate lowering medications. TSH normal. No indication for pacemaker. I reassured him that this is not dangerous and may be his new norm. We reviewed symptoms that would indicate that he was not tolerating the low heart rate and he will let notify us if they develop.   Hypertension: He stopped amlodipine due to possible causing bad taste in his mouth, ruled out that it was not the amlodipine. Home BP's 130's-140's/60's-70's. He agrees to resume amlodipine with a goal BP <130/70 for CV risk reduction.  Info on DASH diet given.   Hyperlipidemia: not felt to need a statin. Followed at [facility]. Treating with diet and exercise.   Elevated coronary calcium score of 186.  Risk factor modification. He is following a fairly healthy diet and is exercising. He does not smoke.   OSA: Uses CPAP consistently.   Follow up in 3 months with Dr. Harrington Challenger per pt preference. Follow BP.   Medication Adjustments/Labs and Tests Ordered: Current medicines are reviewed at length with the patient today.  Concerns regarding medicines are outlined above. Labs and tests ordered and medication changes are outlined in the patient instructions below:  Patient Instructions  Medication Instructions: START: Amlodipine 5 mg daily   Labwork: None Ordered  Procedures/Testing: None Ordered  Follow-Up: Your physician wants you to follow-up in 3 months with Dr. Harrington Challenger   Any Additional Special Instructions Will Be Listed Below (If Applicable).   DASH Eating Plan DASH stands for "Dietary Approaches to Stop Hypertension." The DASH eating plan is a healthy eating plan  that has been shown to reduce high blood pressure (hypertension). It  may also reduce your risk for type 2 diabetes, heart disease, and stroke. The DASH eating plan may also help with weight loss. What are tips for following this plan? General guidelines  Avoid eating more than 2,300 mg (milligrams) of salt (sodium) a day. If you have hypertension, you may need to reduce your sodium intake to 1,500 mg a day.  Limit alcohol intake to no more than 1 drink a day for nonpregnant women and 2 drinks a day for men. One drink equals 12 oz of beer, 5 oz of wine, or 1 oz of hard liquor.  Work with your health care provider to maintain a healthy body weight or to lose weight. Ask what an ideal weight is for you.  Get at least 30 minutes of exercise that causes your heart to beat faster (aerobic exercise) most days of the week. Activities may include walking, swimming, or biking.  Work with your health care provider or diet and nutrition specialist (dietitian) to adjust your eating plan to your individual calorie needs. Reading food labels  Check food labels for the amount of sodium per serving. Choose foods with less than 5 percent of the Daily Value of sodium. Generally, foods with less than 300 mg of sodium per serving fit into this eating plan.  To find whole grains, look for the word "whole" as the first word in the ingredient list. Shopping  Buy products labeled as "low-sodium" or "no salt added."  Buy fresh foods. Avoid canned foods and premade or frozen meals. Cooking  Avoid adding salt when cooking. Use salt-free seasonings or herbs instead of table salt or sea salt. Check with your health care provider or pharmacist before using salt substitutes.  Do not fry foods. Cook foods using healthy methods such as baking, boiling, grilling, and broiling instead.  Cook with heart-healthy oils, such as olive, canola, soybean, or sunflower oil. Meal planning   Eat a balanced diet that  includes: ? 5 or more servings of fruits and vegetables each day. At each meal, try to fill half of your plate with fruits and vegetables. ? Up to 6-8 servings of whole grains each day. ? Less than 6 oz of lean meat, poultry, or fish each day. A 3-oz serving of meat is about the same size as a deck of cards. One egg equals 1 oz. ? 2 servings of low-fat dairy each day. ? A serving of nuts, seeds, or beans 5 times each week. ? Heart-healthy fats. Healthy fats called Omega-3 fatty acids are found in foods such as flaxseeds and coldwater fish, like sardines, salmon, and mackerel.  Limit how much you eat of the following: ? Canned or prepackaged foods. ? Food that is high in trans fat, such as fried foods. ? Food that is high in saturated fat, such as fatty meat. ? Sweets, desserts, sugary drinks, and other foods with added sugar. ? Full-fat dairy products.  Do not salt foods before eating.  Try to eat at least 2 vegetarian meals each week.  Eat more home-cooked food and less restaurant, buffet, and fast food.  When eating at a restaurant, ask that your food be prepared with less salt or no salt, if possible. What foods are recommended? The items listed may not be a complete list. Talk with your dietitian about what dietary choices are best for you. Grains Whole-grain or whole-wheat bread. Whole-grain or whole-wheat pasta. Brown rice. [place]. Bulgur. Whole-grain and low-sodium cereals. Pita bread. Low-fat, low-sodium crackers. Whole-wheat flour  tortillas. Vegetables Fresh or frozen vegetables (raw, steamed, roasted, or grilled). Low-sodium or reduced-sodium tomato and vegetable juice. Low-sodium or reduced-sodium tomato sauce and tomato paste. Low-sodium or reduced-sodium canned vegetables. Fruits All fresh, dried, or frozen fruit. Canned fruit in natural juice (without added sugar). Meat and other protein foods Skinless chicken or [place]. Ground chicken or [place]. Pork with fat  trimmed off. Fish and seafood. Egg whites. Dried beans, peas, or lentils. Unsalted nuts, nut butters, and seeds. Unsalted canned beans. Lean cuts of beef with fat trimmed off. Low-sodium, lean deli meat. Dairy Low-fat (1%) or fat-free (skim) milk. Fat-free, low-fat, or reduced-fat cheeses. Nonfat, low-sodium ricotta or cottage cheese. Low-fat or nonfat yogurt. Low-fat, low-sodium cheese. Fats and oils Soft margarine without trans fats. Vegetable oil. Low-fat, reduced-fat, or light mayonnaise and salad dressings (reduced-sodium). Canola, safflower, olive, soybean, and sunflower oils. Avocado. Seasoning and other foods Herbs. Spices. Seasoning mixes without salt. Unsalted popcorn and pretzels. Fat-free sweets. What foods are not recommended? The items listed may not be a complete list. Talk with your dietitian about what dietary choices are best for you. Grains Baked goods made with fat, such as croissants, muffins, or some breads. Dry pasta or rice meal packs. Vegetables Creamed or fried vegetables. Vegetables in a cheese sauce. Regular canned vegetables (not low-sodium or reduced-sodium). Regular canned tomato sauce and paste (not low-sodium or reduced-sodium). Regular tomato and vegetable juice (not low-sodium or reduced-sodium). Angie Fava. Olives. Fruits Canned fruit in a light or heavy syrup. Fried fruit. Fruit in cream or butter sauce. Meat and other protein foods Fatty cuts of meat. Ribs. Fried meat. Berniece Salines. Sausage. Bologna and other processed lunch meats. Salami. Fatback. Hotdogs. Bratwurst. Salted nuts and seeds. Canned beans with added salt. Canned or smoked fish. Whole eggs or egg yolks. Chicken or [place] with skin. Dairy Whole or 2% milk, cream, and half-and-half. Whole or full-fat cream cheese. Whole-fat or sweetened yogurt. Full-fat cheese. Nondairy creamers. Whipped toppings. Processed cheese and cheese spreads. Fats and oils Butter. Stick margarine. Lard. Shortening. Ghee. Bacon fat.  Tropical oils, such as coconut, palm kernel, or palm oil. Seasoning and other foods Salted popcorn and pretzels. Onion salt, garlic salt, seasoned salt, table salt, and sea salt. Worcestershire sauce. Tartar sauce. Barbecue sauce. Teriyaki sauce. Soy sauce, including reduced-sodium. Steak sauce. Canned and packaged gravies. Fish sauce. Oyster sauce. Cocktail sauce. Horseradish that you find on the shelf. Ketchup. Mustard. Meat flavorings and tenderizers. Bouillon cubes. Hot sauce and Tabasco sauce. Premade or packaged marinades. Premade or packaged taco seasonings. Relishes. Regular salad dressings. Where to find more information:  National Heart, Lung, and [place]: https://wilson-eaton.com/  American Heart Association: www.heart.org Summary  The DASH eating plan is a healthy eating plan that has been shown to reduce high blood pressure (hypertension). It may also reduce your risk for type 2 diabetes, heart disease, and stroke.  With the DASH eating plan, you should limit salt (sodium) intake to 2,300 mg a day. If you have hypertension, you may need to reduce your sodium intake to 1,500 mg a day.  When on the DASH eating plan, aim to eat more fresh fruits and vegetables, whole grains, lean proteins, low-fat dairy, and heart-healthy fats.  Work with your health care provider or diet and nutrition specialist (dietitian) to adjust your eating plan to your individual calorie needs. This information is not intended to replace advice given to you by your health care provider. Make sure you discuss any questions you have with your health care provider.  Document Released: 05/18/2011 Document Revised: 05/22/2016 Document Reviewed: 05/22/2016 Elsevier Interactive Patient Education  [employer].    If you need a refill on your cardiac medications before your next appointment, please call your pharmacy.      Signed, Daune Perch, NP  02/15/2018 10:39 AM    [facility]  [facility]

## 2018-02-15 NOTE — Patient Instructions (Signed)
Medication Instructions: START: Amlodipine 5 mg daily   Labwork: None Ordered  Procedures/Testing: None Ordered  Follow-Up: Your physician wants you to follow-up in 3 months with Dr. Harrington Challenger   Any Additional Special Instructions Will Be Listed Below (If Applicable).   DASH Eating Plan DASH stands for "Dietary Approaches to Stop Hypertension." The DASH eating plan is a healthy eating plan that has been shown to reduce high blood pressure (hypertension). It may also reduce your risk for type 2 diabetes, heart disease, and stroke. The DASH eating plan may also help with weight loss. What are tips for following this plan? General guidelines  Avoid eating more than 2,300 mg (milligrams) of salt (sodium) a day. If you have hypertension, you may need to reduce your sodium intake to 1,500 mg a day.  Limit alcohol intake to no more than 1 drink a day for nonpregnant women and 2 drinks a day for men. One drink equals 12 oz of beer, 5 oz of wine, or 1 oz of hard liquor.  Work with your health care provider to maintain a healthy body weight or to lose weight. Ask what an ideal weight is for you.  Get at least 30 minutes of exercise that causes your heart to beat faster (aerobic exercise) most days of the week. Activities may include walking, swimming, or biking.  Work with your health care provider or diet and nutrition specialist (dietitian) to adjust your eating plan to your individual calorie needs. Reading food labels  Check food labels for the amount of sodium per serving. Choose foods with less than 5 percent of the Daily Value of sodium. Generally, foods with less than 300 mg of sodium per serving fit into this eating plan.  To find whole grains, look for the word "whole" as the first word in the ingredient list. Shopping  Buy products labeled as "low-sodium" or "no salt added."  Buy fresh foods. Avoid canned foods and premade or frozen meals. Cooking  Avoid adding salt when  cooking. Use salt-free seasonings or herbs instead of table salt or sea salt. Check with your health care provider or pharmacist before using salt substitutes.  Do not fry foods. Cook foods using healthy methods such as baking, boiling, grilling, and broiling instead.  Cook with heart-healthy oils, such as olive, canola, soybean, or sunflower oil. Meal planning   Eat a balanced diet that includes: ? 5 or more servings of fruits and vegetables each day. At each meal, try to fill half of your plate with fruits and vegetables. ? Up to 6-8 servings of whole grains each day. ? Less than 6 oz of lean meat, poultry, or fish each day. A 3-oz serving of meat is about the same size as a deck of cards. One egg equals 1 oz. ? 2 servings of low-fat dairy each day. ? A serving of nuts, seeds, or beans 5 times each week. ? Heart-healthy fats. Healthy fats called Omega-3 fatty acids are found in foods such as flaxseeds and coldwater fish, like sardines, salmon, and mackerel.  Limit how much you eat of the following: ? Canned or prepackaged foods. ? Food that is high in trans fat, such as fried foods. ? Food that is high in saturated fat, such as fatty meat. ? Sweets, desserts, sugary drinks, and other foods with added sugar. ? Full-fat dairy products.  Do not salt foods before eating.  Try to eat at least 2 vegetarian meals each week.  Eat more home-cooked food and less  restaurant, buffet, and fast food.  When eating at a restaurant, ask that your food be prepared with less salt or no salt, if possible. What foods are recommended? The items listed may not be a complete list. Talk with your dietitian about what dietary choices are best for you. Grains Whole-grain or whole-wheat bread. Whole-grain or whole-wheat pasta. Brown rice. Modena Morrow. Bulgur. Whole-grain and low-sodium cereals. Pita bread. Low-fat, low-sodium crackers. Whole-wheat flour tortillas. Vegetables Fresh or frozen vegetables  (raw, steamed, roasted, or grilled). Low-sodium or reduced-sodium tomato and vegetable juice. Low-sodium or reduced-sodium tomato sauce and tomato paste. Low-sodium or reduced-sodium canned vegetables. Fruits All fresh, dried, or frozen fruit. Canned fruit in natural juice (without added sugar). Meat and other protein foods Skinless chicken or [place]. Ground chicken or [place]. Pork with fat trimmed off. Fish and seafood. Egg whites. Dried beans, peas, or lentils. Unsalted nuts, nut butters, and seeds. Unsalted canned beans. Lean cuts of beef with fat trimmed off. Low-sodium, lean deli meat. Dairy Low-fat (1%) or fat-free (skim) milk. Fat-free, low-fat, or reduced-fat cheeses. Nonfat, low-sodium ricotta or cottage cheese. Low-fat or nonfat yogurt. Low-fat, low-sodium cheese. Fats and oils Soft margarine without trans fats. Vegetable oil. Low-fat, reduced-fat, or light mayonnaise and salad dressings (reduced-sodium). Canola, safflower, olive, soybean, and sunflower oils. Avocado. Seasoning and other foods Herbs. Spices. Seasoning mixes without salt. Unsalted popcorn and pretzels. Fat-free sweets. What foods are not recommended? The items listed may not be a complete list. Talk with your dietitian about what dietary choices are best for you. Grains Baked goods made with fat, such as croissants, muffins, or some breads. Dry pasta or rice meal packs. Vegetables Creamed or fried vegetables. Vegetables in a cheese sauce. Regular canned vegetables (not low-sodium or reduced-sodium). Regular canned tomato sauce and paste (not low-sodium or reduced-sodium). Regular tomato and vegetable juice (not low-sodium or reduced-sodium). Angie Fava. Olives. Fruits Canned fruit in a light or heavy syrup. Fried fruit. Fruit in cream or butter sauce. Meat and other protein foods Fatty cuts of meat. Ribs. Fried meat. Berniece Salines. Sausage. Bologna and other processed lunch meats. Salami. Fatback. Hotdogs. Bratwurst. Salted nuts  and seeds. Canned beans with added salt. Canned or smoked fish. Whole eggs or egg yolks. Chicken or [place] with skin. Dairy Whole or 2% milk, cream, and half-and-half. Whole or full-fat cream cheese. Whole-fat or sweetened yogurt. Full-fat cheese. Nondairy creamers. Whipped toppings. Processed cheese and cheese spreads. Fats and oils Butter. Stick margarine. Lard. Shortening. Ghee. Bacon fat. Tropical oils, such as coconut, palm kernel, or palm oil. Seasoning and other foods Salted popcorn and pretzels. Onion salt, garlic salt, seasoned salt, table salt, and sea salt. Worcestershire sauce. Tartar sauce. Barbecue sauce. Teriyaki sauce. Soy sauce, including reduced-sodium. Steak sauce. Canned and packaged gravies. Fish sauce. Oyster sauce. Cocktail sauce. Horseradish that you find on the shelf. Ketchup. Mustard. Meat flavorings and tenderizers. Bouillon cubes. Hot sauce and Tabasco sauce. Premade or packaged marinades. Premade or packaged taco seasonings. Relishes. Regular salad dressings. Where to find more information:  National Heart, Lung, and [place]: https://wilson-eaton.com/  American Heart Association: www.heart.org Summary  The DASH eating plan is a healthy eating plan that has been shown to reduce high blood pressure (hypertension). It may also reduce your risk for type 2 diabetes, heart disease, and stroke.  With the DASH eating plan, you should limit salt (sodium) intake to 2,300 mg a day. If you have hypertension, you may need to reduce your sodium intake to 1,500 mg a day.  When on the DASH eating plan, aim to eat more fresh fruits and vegetables, whole grains, lean proteins, low-fat dairy, and heart-healthy fats.  Work with your health care provider or diet and nutrition specialist (dietitian) to adjust your eating plan to your individual calorie needs. This information is not intended to replace advice given to you by your health care provider. Make sure you discuss any questions  you have with your health care provider. Document Released: 05/18/2011 Document Revised: 05/22/2016 Document Reviewed: 05/22/2016 Elsevier Interactive Patient Education  [employer].    If you need a refill on your cardiac medications before your next appointment, please call your pharmacy.

## 2018-02-18 ENCOUNTER — Telehealth: Payer: Self-pay

## 2018-02-18 NOTE — Telephone Encounter (Signed)
I spoke with the patient about referral to ENT per procedure report on 02/13/18.  Patient is established with ENT with [facility] and has seen his ENT and does not wish for me to continue with referral.

## 2018-02-18 NOTE — Addendum Note (Signed)
Addended by: Rose Phi on: 02/18/2018 08:02 AM   Modules accepted: Orders

## 2018-02-22 ENCOUNTER — Telehealth: Payer: Self-pay | Admitting: Gastroenterology

## 2018-02-22 ENCOUNTER — Encounter: Payer: Self-pay | Admitting: Gastroenterology

## 2018-02-22 NOTE — Telephone Encounter (Signed)
I reviewed the pathology letter that was created today.  All questions answered.  He is asked to call back for any additional questions or concerns.

## 2018-02-25 DIAGNOSIS — Z23 Encounter for immunization: Secondary | ICD-10-CM | POA: Diagnosis not present

## 2018-03-01 ENCOUNTER — Ambulatory Visit
Admission: RE | Admit: 2018-03-01 | Discharge: 2018-03-01 | Disposition: A | Discharge status: Home / Self Care | Payer: Medicare Other | Source: Ambulatory Visit | Attending: Otolaryngology | Admitting: Otolaryngology

## 2018-03-01 DIAGNOSIS — E041 Nontoxic single thyroid nodule: Secondary | ICD-10-CM

## 2018-03-01 DIAGNOSIS — E042 Nontoxic multinodular goiter: Secondary | ICD-10-CM | POA: Diagnosis not present

## 2018-03-07 DIAGNOSIS — J324 Chronic pansinusitis: Secondary | ICD-10-CM | POA: Diagnosis not present

## 2018-03-27 DIAGNOSIS — J343 Hypertrophy of nasal turbinates: Secondary | ICD-10-CM | POA: Diagnosis not present

## 2018-03-27 DIAGNOSIS — J342 Deviated nasal septum: Secondary | ICD-10-CM | POA: Diagnosis not present

## 2018-03-27 DIAGNOSIS — J324 Chronic pansinusitis: Secondary | ICD-10-CM | POA: Diagnosis not present

## 2018-03-27 DIAGNOSIS — J329 Chronic sinusitis, unspecified: Secondary | ICD-10-CM | POA: Diagnosis not present

## 2018-04-05 DIAGNOSIS — L821 Other seborrheic keratosis: Secondary | ICD-10-CM | POA: Diagnosis not present

## 2018-04-05 DIAGNOSIS — D2371 Other benign neoplasm of skin of right lower limb, including hip: Secondary | ICD-10-CM | POA: Diagnosis not present

## 2018-04-05 DIAGNOSIS — D225 Melanocytic nevi of trunk: Secondary | ICD-10-CM | POA: Diagnosis not present

## 2018-04-05 DIAGNOSIS — L812 Freckles: Secondary | ICD-10-CM | POA: Diagnosis not present

## 2018-04-05 DIAGNOSIS — D1801 Hemangioma of skin and subcutaneous tissue: Secondary | ICD-10-CM | POA: Diagnosis not present

## 2018-04-05 DIAGNOSIS — D2272 Melanocytic nevi of left lower limb, including hip: Secondary | ICD-10-CM | POA: Diagnosis not present

## 2018-04-05 DIAGNOSIS — L570 Actinic keratosis: Secondary | ICD-10-CM | POA: Diagnosis not present

## 2018-04-11 ENCOUNTER — Other Ambulatory Visit: Payer: Self-pay | Admitting: Otolaryngology

## 2018-04-11 DIAGNOSIS — E041 Nontoxic single thyroid nodule: Secondary | ICD-10-CM

## 2018-05-08 ENCOUNTER — Other Ambulatory Visit (HOSPITAL_COMMUNITY)
Admission: RE | Admit: 2018-05-08 | Discharge: 2018-05-08 | Disposition: A | Discharge status: Home / Self Care | Payer: Medicare Other | Source: Ambulatory Visit | Attending: Radiology | Admitting: Radiology

## 2018-05-08 ENCOUNTER — Ambulatory Visit
Admission: RE | Admit: 2018-05-08 | Discharge: 2018-05-08 | Disposition: A | Discharge status: Home / Self Care | Payer: Medicare Other | Source: Ambulatory Visit | Attending: Otolaryngology | Admitting: Otolaryngology

## 2018-05-08 DIAGNOSIS — E041 Nontoxic single thyroid nodule: Secondary | ICD-10-CM | POA: Diagnosis not present

## 2018-05-15 ENCOUNTER — Other Ambulatory Visit: Payer: Medicare Other | Admitting: *Deleted

## 2018-05-15 DIAGNOSIS — E785 Hyperlipidemia, unspecified: Secondary | ICD-10-CM | POA: Diagnosis not present

## 2018-05-16 DIAGNOSIS — J342 Deviated nasal septum: Secondary | ICD-10-CM | POA: Diagnosis not present

## 2018-05-16 DIAGNOSIS — J324 Chronic pansinusitis: Secondary | ICD-10-CM | POA: Diagnosis not present

## 2018-05-16 DIAGNOSIS — E041 Nontoxic single thyroid nodule: Secondary | ICD-10-CM | POA: Diagnosis not present

## 2018-05-16 LAB — NMR, LIPOPROFILE
Cholesterol, Total: 158 mg/dL (ref 100–199)
HDL Particle Number: 27.4 umol/L — ABNORMAL LOW (ref >=30.5)
HDL-C: 34 mg/dL — AB (ref >39)
LDL Particle Number: 1106 nmol/L — ABNORMAL HIGH (ref <1000)
LDL SIZE: 20.6 nm (ref >20.5)
LDL-C: 103 mg/dL — ABNORMAL HIGH (ref 0–99)
LP-IR SCORE: 45 (ref <=45)
Small LDL Particle Number: 633 nmol/L — ABNORMAL HIGH (ref <=527)
Triglycerides: 104 mg/dL (ref 0–149)

## 2018-05-16 LAB — LIPOPROTEIN A (LPA): Lipoprotein (a): 12.3 nmol/L (ref <75.0)

## 2018-05-16 LAB — APOLIPOPROTEIN B: APOLIPOPROTEIN B: 90 mg/dL — AB (ref <90)

## 2018-05-20 ENCOUNTER — Encounter: Payer: Self-pay | Admitting: Internal Medicine

## 2018-05-20 ENCOUNTER — Ambulatory Visit (INDEPENDENT_AMBULATORY_CARE_PROVIDER_SITE_OTHER): Payer: Medicare Other | Admitting: Internal Medicine

## 2018-05-20 VITALS — BP 130/60 | HR 66 | Ht 70.0 in | Wt 217.0 lb

## 2018-05-20 DIAGNOSIS — E785 Hyperlipidemia, unspecified: Secondary | ICD-10-CM

## 2018-05-20 DIAGNOSIS — G4733 Obstructive sleep apnea (adult) (pediatric): Secondary | ICD-10-CM | POA: Diagnosis not present

## 2018-05-20 DIAGNOSIS — I10 Essential (primary) hypertension: Secondary | ICD-10-CM | POA: Diagnosis not present

## 2018-05-20 MED ORDER — AMLODIPINE BESYLATE 5 MG PO TABS: 5.0000 mg | ORAL_TABLET | Freq: Two times a day (BID) | ORAL | 3 refills | Status: DC

## 2018-05-20 NOTE — Progress Notes (Addendum)
Cardiology Office Note   Date:  05/20/2018   ID:  Cory Mathis, DOB 11-Apr-1951, MRN [id number]  PCP:  [name], MD  Cardiologist:   Dorris Carnes, MD   Pt presents fro f/u of HTN and BP   History of Present Illness: Cory Mathis is a 67 y.o. male who moved to [place] from [place] in 2016   He established with [facility]   He has a histroy of anxiety, HTN, RBBB, sleep apnea, GERD, palpitaitons, FUO (pt had x 2 with extensive workups) chest pain and HL  He was seen by Thurman Coyer in the past  Dr Wynonia Lawman recomm a statin due to Ca score of 168   Pt did not want this     I saw him for the first time earlier this year.   I saw the pt in May   BP at that time was 120s to 140s In June  I saw hiim     BP 120s to 140s   Majority in low 130s     Yesterday in primary doctors office was 132/64 He was seen by Buren Kos in Sept   BP was 184/102   Off amlodipine for 1 moth at that time   Michela Pitcher it lead to bad tast e in mouth  (started on ABX for sinus infection)   Said his BP at home was 130s t 140s    Since seen the pt says he has finished abx   Plan for sinus surgery in Feb Denies CP    Brings in BP log from home    Labile   120s to 150   Mostly 130s to 140s/    Current Meds  Medication Sig  . amLODipine (NORVASC) 5 MG tablet Take 1 tablet (5 mg total) by mouth daily.  . pantoprazole (PROTONIX) 40 MG tablet Take 40 mg by mouth daily.  Marland Kitchen zolpidem (AMBIEN CR) 12.5 MG CR tablet Take 12.5 mg by mouth at bedtime as needed for sleep.     Allergies:   Sulfa antibiotics; Sulfasalazine; Metronidazole; and Other   Past Medical History:  Diagnosis Date  . BPH (benign prostatic hyperplasia)   . Diverticulosis of colon   . GERD (gastroesophageal reflux disease)    prn med. control  . History of adenomatous polyp of colon    03-29-2007  tubular adenoma/  08-07-2016  hyperplastic and tubular adenoma's  . History of alcoholic gastritis    25-10-3974  gastritis, esophagitis, peptic duodenitis  . History  of colonic diverticulitis    01-31-2016  resolved w/ medications  . History of Helicobacter pylori infection    10-30-2008  . 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 Internal hemorrhoids   . Memory loss   . OSA on CPAP    CPAP nightly  . Right thyroid nodule   . Urinary retention 12/13/2016    Past Surgical History:  Procedure Laterality Date  . COLONOSCOPY  last one 08-07-2016  . HEMORRHOID BANDING  10-20-2016;  09-11-2016  . HERNIA REPAIR    . KNEE ARTHROSCOPY Bilateral    10 + yrs ago  . LASIK Bilateral   . PILONIDAL CYST EXCISION    . THYROIDECTOMY Left 2013  . TRANSURETHRAL RESECTION OF PROSTATE N/A 01/04/2017   Procedure: TRANSURETHRAL RESECTION OF THE PROSTATE (TURP);  Surgeon: Franchot Gallo, MD;  Location: [facility];  Service: Urology;  Laterality: N/A;  . UPPER GASTROINTESTINAL ENDOSCOPY  10/30/2008  . WISDOM TOOTH EXTRACTION       Social History:  The patient  reports that he has quit smoking. He has never used smokeless tobacco. He reports that he drinks alcohol. He reports that he does not use drugs.   Family History:  The patient's family history includes Heart attack in his mother; Other in his father and sister; Prostate cancer in his maternal grandfather.    ROS:  Please see the history of present illness. All other systems are reviewed and  Negative to the above problem except as noted.    PHYSICAL EXAM: VS:  BP 130/60   Pulse 66   Ht 5\' 10"  (1.778 m)   Wt 217 lb (98.4 kg)   SpO2 95%   BMI 31.14 kg/m   GEN: Well nourished, well developed, in no acute distress  HEENT: normal  Neck:  JVP is normal   No  , carotid bruits, or masses Cardiac: RRR; no murmurs, rubs, or gallops,no edema  Respiratory:  clear to auscultation bilaterally, normal work of breathing GI: soft, nontender, nondistended, + BS  No hepatomegaly  MS: no deformity Moving all extremities   Skin: warm and dry, no  rash Neuro:  Strength and sensation are intact Psych: euthymic mood, full affect   EKG:  EKG is not ordered today   Lipid Panel No results found for: CHOL, TRIG, HDL, CHOLHDL, VLDL, LDLCALC, LDLDIRECT    Wt Readings from Last 3 Encounters:  05/20/18 217 lb (98.4 kg)  02/15/18 213 lb 6.4 oz (96.8 kg)  02/13/18 211 lb (95.7 kg)      ASSESSMENT AND PLAN:  1  HTN  BP a little high   I recomm he increase amlodipoine to 7.5 mg   If tolerates, increase to 5 bid   2  HL   Lipomed recently   Particles not optimal   But not that bad    Particle number a little high  LDL 103   Pt would like to work on diet and exercise   Will f/u later next spring   3  Palpitaitons   Denies   4   ENT  For sinus surgery in Feb  5   OSA  Hopefully sinus surgery will help with CPAP tolearnce     Uses about 50% time    Current medicines are reviewed at length with the patient today.  The patient does not have concerns regarding medicines.  Signed, Dorris Carnes, MD  05/20/2018 10:00 AM    [address], [place], [place]  [address] Phone: [phone]; Fax: [phone]

## 2018-05-20 NOTE — Patient Instructions (Addendum)
Medication Instructions:  Your physician has recommended you make the following change in your medication:  1.) increase amlodipine to 5 mg TWICE A DAY  If you need a refill on your cardiac medications before your next appointment, please call your pharmacy.   Lab work: NMR lipomed - fasting - in April  If you have labs (blood work) drawn today and your tests are completely normal, you will receive your results only by: Marland Kitchen MyChart Message (if you have MyChart) OR . A paper copy in the mail If you have any lab test that is abnormal or we need to change your treatment, we will call you to review the results.  Testing/Procedures: none  Follow-Up: At [facility], you and your health needs are our priority.  As part of our continuing mission to provide you with exceptional heart care, we have created designated Provider Care Teams.  These Care Teams include your primary Cardiologist (physician) and Advanced Practice Providers (APPs -  Physician Assistants and Nurse Practitioners) who all work together to provide you with the care you need, when you need it. You will need a follow up appointment in:  4-5 months.  Please call our office 2 months in advance to schedule this appointment.  You may see Dorris Carnes, MD or one of the following Advanced Practice Providers on your designated Care Team: Richardson Dopp, PA-C [place], [place] . Daune Perch, NP  Any Other Special Instructions Will Be Listed Below (If Applicable).

## 2018-06-10 DIAGNOSIS — G4700 Insomnia, unspecified: Secondary | ICD-10-CM | POA: Diagnosis not present

## 2018-06-10 DIAGNOSIS — G4733 Obstructive sleep apnea (adult) (pediatric): Secondary | ICD-10-CM | POA: Diagnosis not present

## 2018-06-21 DIAGNOSIS — H2513 Age-related nuclear cataract, bilateral: Secondary | ICD-10-CM | POA: Diagnosis not present

## 2018-06-21 DIAGNOSIS — H35432 Paving stone degeneration of retina, left eye: Secondary | ICD-10-CM | POA: Diagnosis not present

## 2018-06-21 DIAGNOSIS — D3131 Benign neoplasm of right choroid: Secondary | ICD-10-CM | POA: Diagnosis not present

## 2018-07-02 DIAGNOSIS — G4700 Insomnia, unspecified: Secondary | ICD-10-CM | POA: Diagnosis not present

## 2018-07-02 DIAGNOSIS — I10 Essential (primary) hypertension: Secondary | ICD-10-CM | POA: Diagnosis not present

## 2018-07-02 DIAGNOSIS — N400 Enlarged prostate without lower urinary tract symptoms: Secondary | ICD-10-CM | POA: Diagnosis not present

## 2018-07-02 DIAGNOSIS — K219 Gastro-esophageal reflux disease without esophagitis: Secondary | ICD-10-CM | POA: Diagnosis not present

## 2018-07-02 DIAGNOSIS — E039 Hypothyroidism, unspecified: Secondary | ICD-10-CM | POA: Diagnosis not present

## 2018-07-02 DIAGNOSIS — R5383 Other fatigue: Secondary | ICD-10-CM | POA: Diagnosis not present

## 2018-07-08 ENCOUNTER — Telehealth: Payer: Self-pay | Admitting: Gastroenterology

## 2018-07-08 ENCOUNTER — Encounter: Payer: Self-pay | Admitting: Physician Assistant

## 2018-07-08 ENCOUNTER — Ambulatory Visit (INDEPENDENT_AMBULATORY_CARE_PROVIDER_SITE_OTHER): Payer: Medicare Other | Admitting: Physician Assistant

## 2018-07-08 VITALS — BP 140/70 | HR 64 | Ht 70.5 in | Wt 208.0 lb

## 2018-07-08 DIAGNOSIS — K5732 Diverticulitis of large intestine without perforation or abscess without bleeding: Secondary | ICD-10-CM

## 2018-07-08 DIAGNOSIS — K5900 Constipation, unspecified: Secondary | ICD-10-CM

## 2018-07-08 DIAGNOSIS — R432 Parageusia: Secondary | ICD-10-CM | POA: Diagnosis not present

## 2018-07-08 DIAGNOSIS — Z86010 Personal history of colonic polyps: Secondary | ICD-10-CM

## 2018-07-08 MED ORDER — NYSTATIN 100000 UNIT/ML MT SUSP: OROMUCOSAL | 1 refills | Status: DC

## 2018-07-08 MED ORDER — AMOXICILLIN-POT CLAVULANATE 875-125 MG PO TABS: ORAL_TABLET | ORAL | 0 refills | Status: DC

## 2018-07-08 NOTE — Telephone Encounter (Signed)
Left message for patient to call back  

## 2018-07-08 NOTE — Progress Notes (Signed)
Subjective:    Patient ID: Cory Mathis, male    DOB: 07/26/1950, 68 y.o.   MRN: [id number]  HPI Cory Mathis is a pleasant 68 year old white male known to Dr. Fuller Plan who was last seen in September 2019 when he underwent EGD.  He was found to have a small hiatal hernia, mild gastritis and biopsies were negative for H. pylori.   He has history of adenomatous colon polyps and prior diverticulitis.  Also with chronic GERD, obstructive sleep apnea and hypertension as well as grade 3 hemorrhoids. Colonoscopy in February 2018 showed 3 polyps all 6 to 7 mm in size and removed Path consistent with tubular adenomas and a hyperplastic polyp.  Also noted to have multiple diverticuli in the left colon with some luminal narrowing.  Patient comes in today with complaints of left lower quadrant pain onset about 4 days ago and persistent since.  He says this feels very similar to prior episodes of diverticulitis.  He has not had any associated fever or chills.  He has been constipated which is unusual for him and really has not had a bowel movement over the past few days.  He put himself on a primarily liquid diet after onset of pain.  Those that symptoms have improved somewhat over the past 24 hours.  He denies any dysuria but has noticed some increased frequency. No increase in pain postprandially and no nausea or vomiting. States he is going out of the country on February 8 for vacation and is concerned about possibly being ill while he is away.   Review of Systems Pertinent positive and negative review of systems were noted in the above HPI section.  All other review of systems was otherwise negative.  Outpatient Encounter Medications as of 07/08/2018  Medication Sig  . amLODipine (NORVASC) 5 MG tablet Take 1 tablet (5 mg total) by mouth 2 (two) times daily.  . pantoprazole (PROTONIX) 40 MG tablet Take 40 mg by mouth daily.  Marland Kitchen zolpidem (AMBIEN CR) 12.5 MG CR tablet Take 12.5 mg by mouth at bedtime as needed for  sleep.  Marland Kitchen amoxicillin-clavulanate (AUGMENTIN) 875-125 MG tablet Take 1 tablet by mouth twice daily with food. Take as directed.  . nystatin (MYCOSTATIN) 100000 UNIT/ML suspension Take by mouth 5 cc's 4 times daily. Swish and swallow.   No facility-administered encounter medications on file as of 07/08/2018.    Allergies  Allergen Reactions  . Sulfa Antibiotics Hives and Rash  . Sulfasalazine Hives and Other (See Comments)  . Metronidazole Other (See Comments)    Pt reported he was light headed for 2 weeks. Pt reported he was light headed for 2 weeks.  . Other     Dark chocolate and mints - makes him sneeze   Patient Active Problem List   Diagnosis Date Noted  . Bradycardia 02/15/2018  . Agatston coronary artery calcium score between 100 and 199 02/15/2018  . FUO (fever of unknown origin) 08/16/2017  . Goiter 08/16/2017  . Status post thyroidectomy 08/16/2017  . Hypertension 08/16/2017  . Dyslipidemia 08/16/2017  . Obstructive sleep apnea 08/16/2017  . Memory loss 07/25/2017  . Enlarged prostate with urinary obstruction 01/04/2017  . Prolapsed internal hemorrhoids, grade 3 09/13/2016  . Eustachian tube dysfunction, bilateral 05/19/2016   Social History   Socioeconomic History  . Marital status: Married    Spouse name: Not on file  . Number of children: 0  . Years of education: 16+  . Highest education level: Bachelor's degree (e.g., BA, AB,  BS)  Occupational History  . Occupation: Retired  Scientific laboratory technician  . Financial resource strain: Not on file  . Food insecurity:    Worry: Not on file    Inability: Not on file  . Transportation needs:    Medical: Not on file    Non-medical: Not on file  Tobacco Use  . Smoking status: Former Research scientist (life sciences)  . Smokeless tobacco: Never Used  Substance and Sexual Activity  . Alcohol use: Yes    Comment: social   . Drug use: No  . Sexual activity: Not on file  Lifestyle  . Physical activity:    Days per week: Not on file    Minutes per  session: Not on file  . Stress: Not on file  Relationships  . Social connections:    Talks on phone: Not on file    Gets together: Not on file    Attends religious service: Not on file    Active member of club or organization: Not on file    Attends meetings of clubs or organizations: Not on file    Relationship status: Not on file  . Intimate partner violence:    Fear of current or ex partner: Not on file    Emotionally abused: Not on file    Physically abused: Not on file    Forced sexual activity: Not on file  Other Topics Concern  . Not on file  Social History Narrative   Left-handed.   1.5 cups caffeine per day.    Mr. Ju family history includes Heart attack in his mother; Other in his father and sister; Prostate cancer in his maternal grandfather.      Objective:    Vitals:   07/08/18 1434  BP: 140/70  Pulse: 64    Physical Exam; well-developed older white male in no acute distress, pleasant.  BMI 29.4.  HEENT ;nontraumatic normocephalic EOMI PERRLA sclera anicteric oral mucosa moist tongue with slight whitish coating.  Cardiovascular; regular rate and rhythm with S1-S2.  Pulmonary ;clear bilaterally, Abdomen; soft, nondistended, bowel sounds are present, no palpable mass or hepatosplenomegaly.  He is tender in the left lower quadrant doubt rebound, mild suprapubic tenderness.  Rectal ;exam not done, Extremities ;no clubbing cyanosis or edema skin warm and dry, Neuropsych ;alert and oriented, grossly nonfocal mood and affect appropriate       Assessment & Plan:   #[birth year] 68 year old white male with 4-day history of left lower quadrant pain and constipation Symptoms are very similar to prior episodes of diverticulitis.  Suspect he has acute diverticulitis/sigmoid colon. #2.  History of tubular adenomatous colon polyps-up-to-date with colonoscopy last done February 2018 #3.  GERD #4.  Altered taste and patient complains of an ongoing bad taste in the back of his  mouth.  He says that is why he initially underwent testing for GERD, however PPI therapy has not relieved the altered taste.  Question consider thrush  #5.  Hypertension #6.  Sleep apnea #7.  History of grade 3 hemorrhoids.  Plan; Patient had been given Cipro and Flagyl in the past but had lightheadedness attributed possibly to Flagyl. We will start Augmentin 875 1 p.o. twice daily x10 days.(I sent prescription for #40 patient will have some antibiotics on hand to take with him when he goes to [place]). Start MiraLAX 17 g in 8 ounces of water daily until bowels are regular. Patient was instructed to gradually advance to a low residue and then regular diet with avoidance of popcorn nuts and  avoid  very high roughage foods over the next few weeks. We will give him a trial of nystatin oral suspension 5 cc swish and swallow 4 times daily x2 weeks Continue metoprolol 40 mg p.o. every morning Patient is asked to call if his symptoms are not improving in the next 4 to 5 days on Augmentin.  Would consider further imaging with CT at that time.  Follow-up colonoscopy 2023.    Cory Kram S Bessie Boyte PA-C 07/08/2018   Cc: [name], MD

## 2018-07-08 NOTE — Telephone Encounter (Signed)
Pt called stating that he is having UC flare up, he is going on vacation and is requesting some antibiotics. Pls call him .

## 2018-07-08 NOTE — Telephone Encounter (Signed)
Patient reports LLQ pain, bloating, constipation.  He has a hx of diverticulitis and this is similar to previous events.  He is leaving the country on vacation in about a week.  Patient will come in and see Amy Esterwood PA today at 2:30

## 2018-07-08 NOTE — Patient Instructions (Addendum)
We sent prescriptions to [address].  1. Augmentin 875/125 mg  2. Nystatin ( Mycostatin) liquid.   Take Miralax 17 grams in 8 oz of water daily for bowels until moving regularly.  Call us in 4-5 days if not better, ask for Amy's nurse.  Normal BMI (Body Mass Index- based on height and weight) is between 23 and 30. Your BMI today is Body mass index is 29.42 kg/m. Marland Kitchen Please consider follow up  regarding your BMI with your Primary Care Provider.

## 2018-07-09 NOTE — Progress Notes (Signed)
Reviewed and agree with management plan. Continue pantoprazole 40 mg po qam (not metoprolol)   Genae Strine T. Fuller Plan, MD [facility]

## 2018-07-15 ENCOUNTER — Telehealth: Payer: Self-pay | Admitting: Physician Assistant

## 2018-07-16 ENCOUNTER — Other Ambulatory Visit: Payer: Self-pay

## 2018-07-16 MED ORDER — PANTOPRAZOLE SODIUM 40 MG PO TBEC: 40.0000 mg | DELAYED_RELEASE_TABLET | Freq: Every morning | ORAL | 3 refills | Status: DC

## 2018-07-16 NOTE — Telephone Encounter (Signed)
Per Amy's last office note pt can continue taking Panto 40 qam.  #90 with 3 refills sent to [facility] on [address].

## 2018-08-01 DIAGNOSIS — J3489 Other specified disorders of nose and nasal sinuses: Secondary | ICD-10-CM | POA: Diagnosis not present

## 2018-08-27 DIAGNOSIS — J209 Acute bronchitis, unspecified: Secondary | ICD-10-CM | POA: Diagnosis not present

## 2018-08-27 DIAGNOSIS — Z6829 Body mass index (BMI) 29.0-29.9, adult: Secondary | ICD-10-CM | POA: Diagnosis not present

## 2018-08-27 DIAGNOSIS — J019 Acute sinusitis, unspecified: Secondary | ICD-10-CM | POA: Diagnosis not present

## 2018-09-30 ENCOUNTER — Telehealth: Payer: Self-pay | Admitting: Internal Medicine

## 2018-09-30 DIAGNOSIS — E785 Hyperlipidemia, unspecified: Secondary | ICD-10-CM

## 2018-09-30 DIAGNOSIS — R931 Abnormal findings on diagnostic imaging of heart and coronary circulation: Secondary | ICD-10-CM

## 2018-09-30 NOTE — Telephone Encounter (Signed)
Pt wrote in   Concerned about upcoming visit Would recomm postponing to early September with labs right prior

## 2018-10-02 NOTE — Telephone Encounter (Signed)
No other labs  Agree with plan

## 2018-10-02 NOTE — Telephone Encounter (Signed)
Scheduled for NMR, lipoprofile, lipo A, apo B per Dr. Harrington Challenger for September 3 and follow up appt Sept 10.

## 2018-10-02 NOTE — Addendum Note (Signed)
Addended by: Rodman Key on: 10/02/2018 02:20 PM   Modules accepted: Orders

## 2018-10-15 DIAGNOSIS — Z0000 Encounter for general adult medical examination without abnormal findings: Secondary | ICD-10-CM | POA: Diagnosis not present

## 2018-11-08 DIAGNOSIS — Z20828 Contact with and (suspected) exposure to other viral communicable diseases: Secondary | ICD-10-CM | POA: Diagnosis not present

## 2018-11-29 ENCOUNTER — Emergency Department (HOSPITAL_COMMUNITY)
Admission: EM | Admit: 2018-11-29 | Discharge: 2018-11-29 | Disposition: A | Discharge status: Home / Self Care | Payer: Medicare Other | Attending: Emergency Medicine | Admitting: Emergency Medicine

## 2018-11-29 ENCOUNTER — Other Ambulatory Visit: Payer: Self-pay

## 2018-11-29 ENCOUNTER — Emergency Department (HOSPITAL_COMMUNITY): Payer: Medicare Other

## 2018-11-29 ENCOUNTER — Encounter (HOSPITAL_COMMUNITY): Payer: Self-pay | Admitting: Emergency Medicine

## 2018-11-29 DIAGNOSIS — I10 Essential (primary) hypertension: Secondary | ICD-10-CM | POA: Diagnosis not present

## 2018-11-29 DIAGNOSIS — R000 Tachycardia, unspecified: Secondary | ICD-10-CM | POA: Diagnosis not present

## 2018-11-29 DIAGNOSIS — Z7901 Long term (current) use of anticoagulants: Secondary | ICD-10-CM | POA: Diagnosis not present

## 2018-11-29 DIAGNOSIS — R42 Dizziness and giddiness: Secondary | ICD-10-CM | POA: Diagnosis not present

## 2018-11-29 DIAGNOSIS — I4891 Unspecified atrial fibrillation: Secondary | ICD-10-CM | POA: Diagnosis not present

## 2018-11-29 DIAGNOSIS — R002 Palpitations: Secondary | ICD-10-CM | POA: Diagnosis present

## 2018-11-29 DIAGNOSIS — R29810 Facial weakness: Secondary | ICD-10-CM | POA: Diagnosis not present

## 2018-11-29 DIAGNOSIS — Z79899 Other long term (current) drug therapy: Secondary | ICD-10-CM | POA: Insufficient documentation

## 2018-11-29 DIAGNOSIS — Z87891 Personal history of nicotine dependence: Secondary | ICD-10-CM | POA: Diagnosis not present

## 2018-11-29 DIAGNOSIS — I4510 Unspecified right bundle-branch block: Secondary | ICD-10-CM | POA: Diagnosis not present

## 2018-11-29 LAB — CBC WITH DIFFERENTIAL/PLATELET
Abs Immature Granulocytes: 0.03 10*3/uL (ref 0.00–0.07)
Basophils Absolute: 0.1 10*3/uL (ref 0.0–0.1)
Basophils Relative: 1 %
Eosinophils Absolute: 0 10*3/uL (ref 0.0–0.5)
Eosinophils Relative: 0 %
HCT: 42.9 % (ref 39.0–52.0)
Hemoglobin: 15 g/dL (ref 13.0–17.0)
Immature Granulocytes: 0 %
Lymphocytes Relative: 19 %
Lymphs Abs: 1.8 10*3/uL (ref 0.7–4.0)
MCH: 31.4 pg (ref 26.0–34.0)
MCHC: 35 g/dL (ref 30.0–36.0)
MCV: 89.9 fL (ref 80.0–100.0)
Monocytes Absolute: 0.7 10*3/uL (ref 0.1–1.0)
Monocytes Relative: 8 %
Neutro Abs: 6.6 10*3/uL (ref 1.7–7.7)
Neutrophils Relative %: 72 %
Platelets: 310 10*3/uL (ref 150–400)
RBC: 4.77 MIL/uL (ref 4.22–5.81)
RDW: 12.3 % (ref 11.5–15.5)
WBC: 9.2 10*3/uL (ref 4.0–10.5)
nRBC: 0 % (ref 0.0–0.2)

## 2018-11-29 LAB — COMPREHENSIVE METABOLIC PANEL
ALT: 15 U/L (ref 0–44)
AST: 19 U/L (ref 15–41)
Albumin: 4.2 g/dL (ref 3.5–5.0)
Alkaline Phosphatase: 120 U/L (ref 38–126)
Anion gap: 9 (ref 5–15)
BUN: 12 mg/dL (ref 8–23)
CO2: 24 mmol/L (ref 22–32)
Calcium: 9.5 mg/dL (ref 8.9–10.3)
Chloride: 106 mmol/L (ref 98–111)
Creatinine, Ser: 1.16 mg/dL (ref 0.61–1.24)
GFR calc Af Amer: >60 mL/min (ref >60)
GFR calc non Af Amer: >60 mL/min (ref >60)
Glucose, Bld: 109 mg/dL — ABNORMAL HIGH (ref 70–99)
Potassium: 3.6 mmol/L (ref 3.5–5.1)
Sodium: 139 mmol/L (ref 135–145)
Total Bilirubin: 0.9 mg/dL (ref 0.3–1.2)
Total Protein: 7.6 g/dL (ref 6.5–8.1)

## 2018-11-29 IMAGING — DX CHEST - 2 VIEW
2 series · 2 of 2 positions shown · non-contrast
Comparison: Chest radiograph [DATE]

CLINICAL DATA: Atrial fibrillation.

EXAM:
CHEST - 2 VIEW

[chest pa]
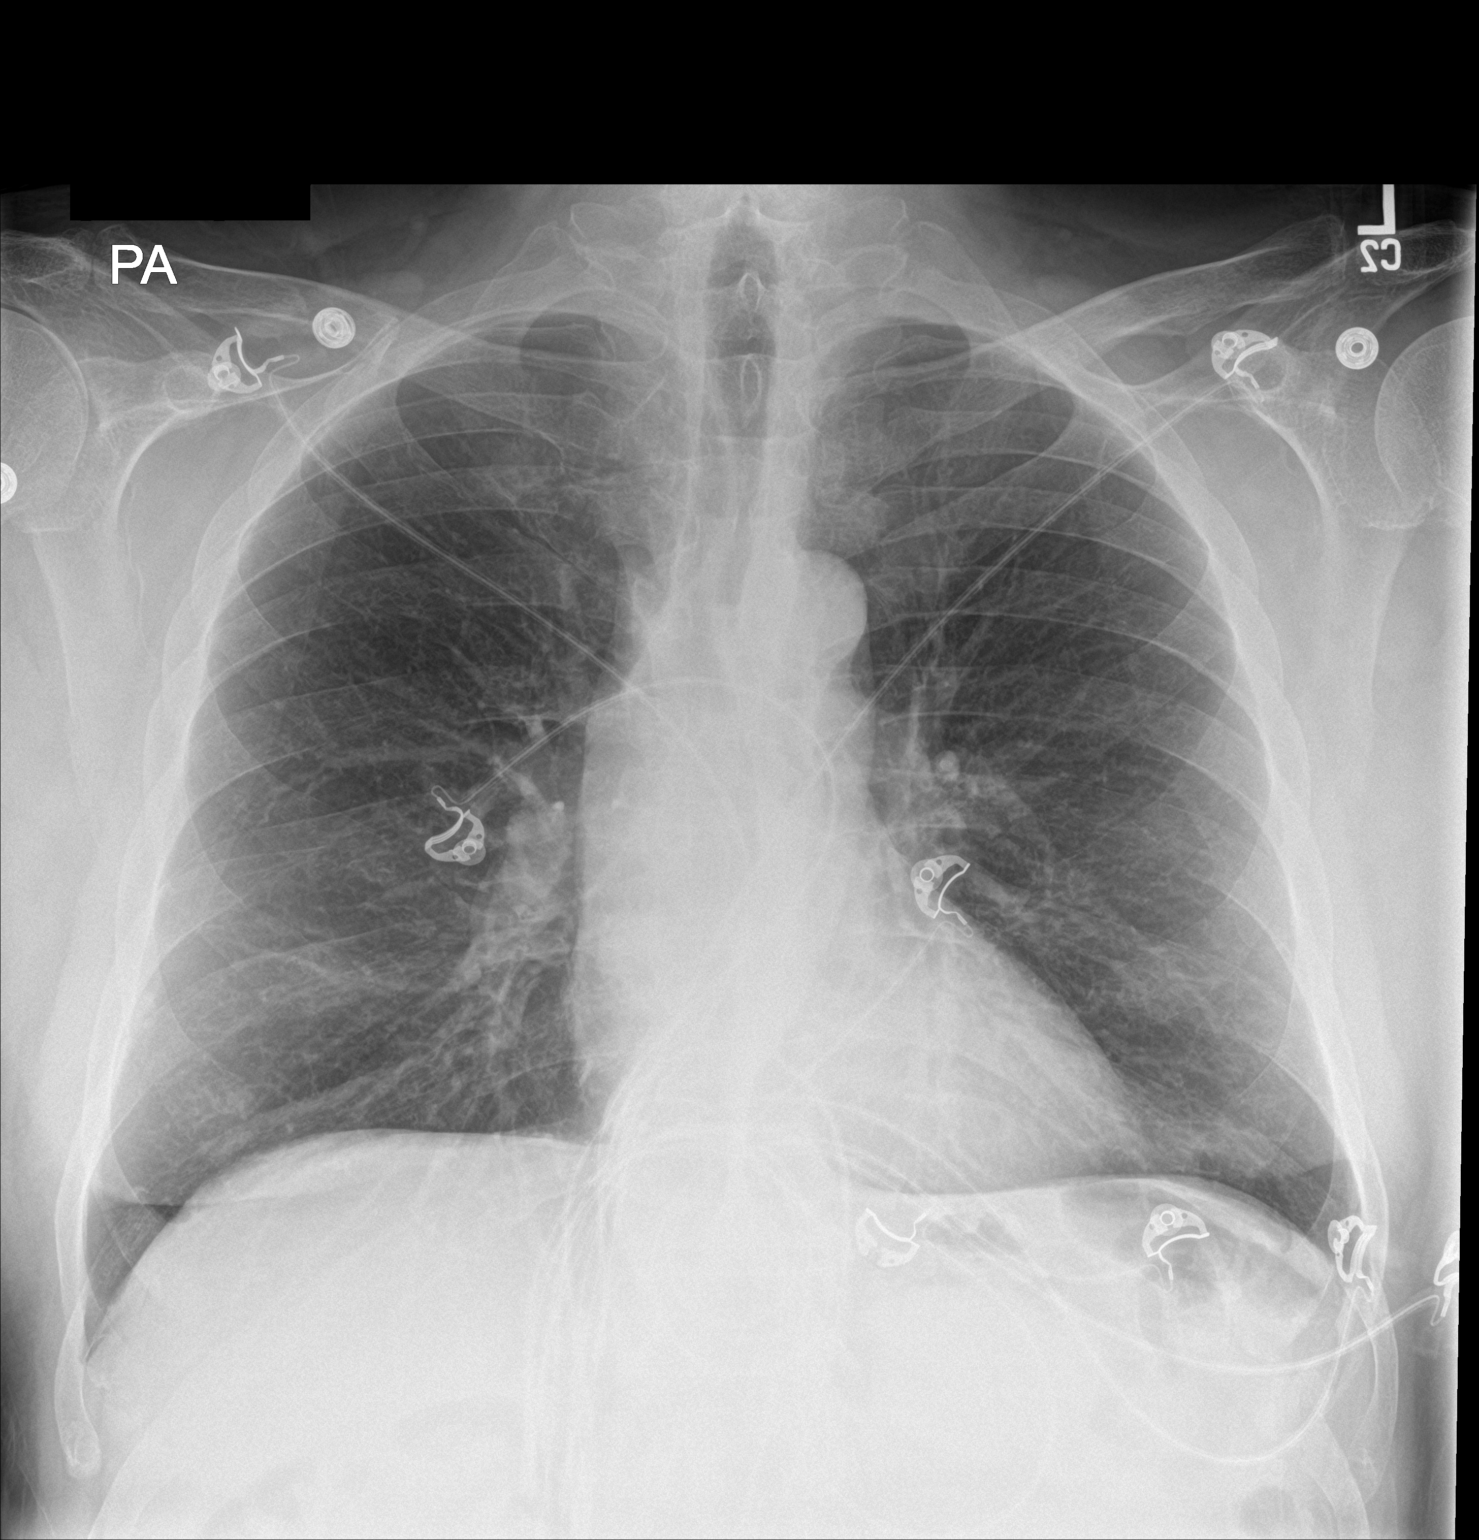

[chest lat]
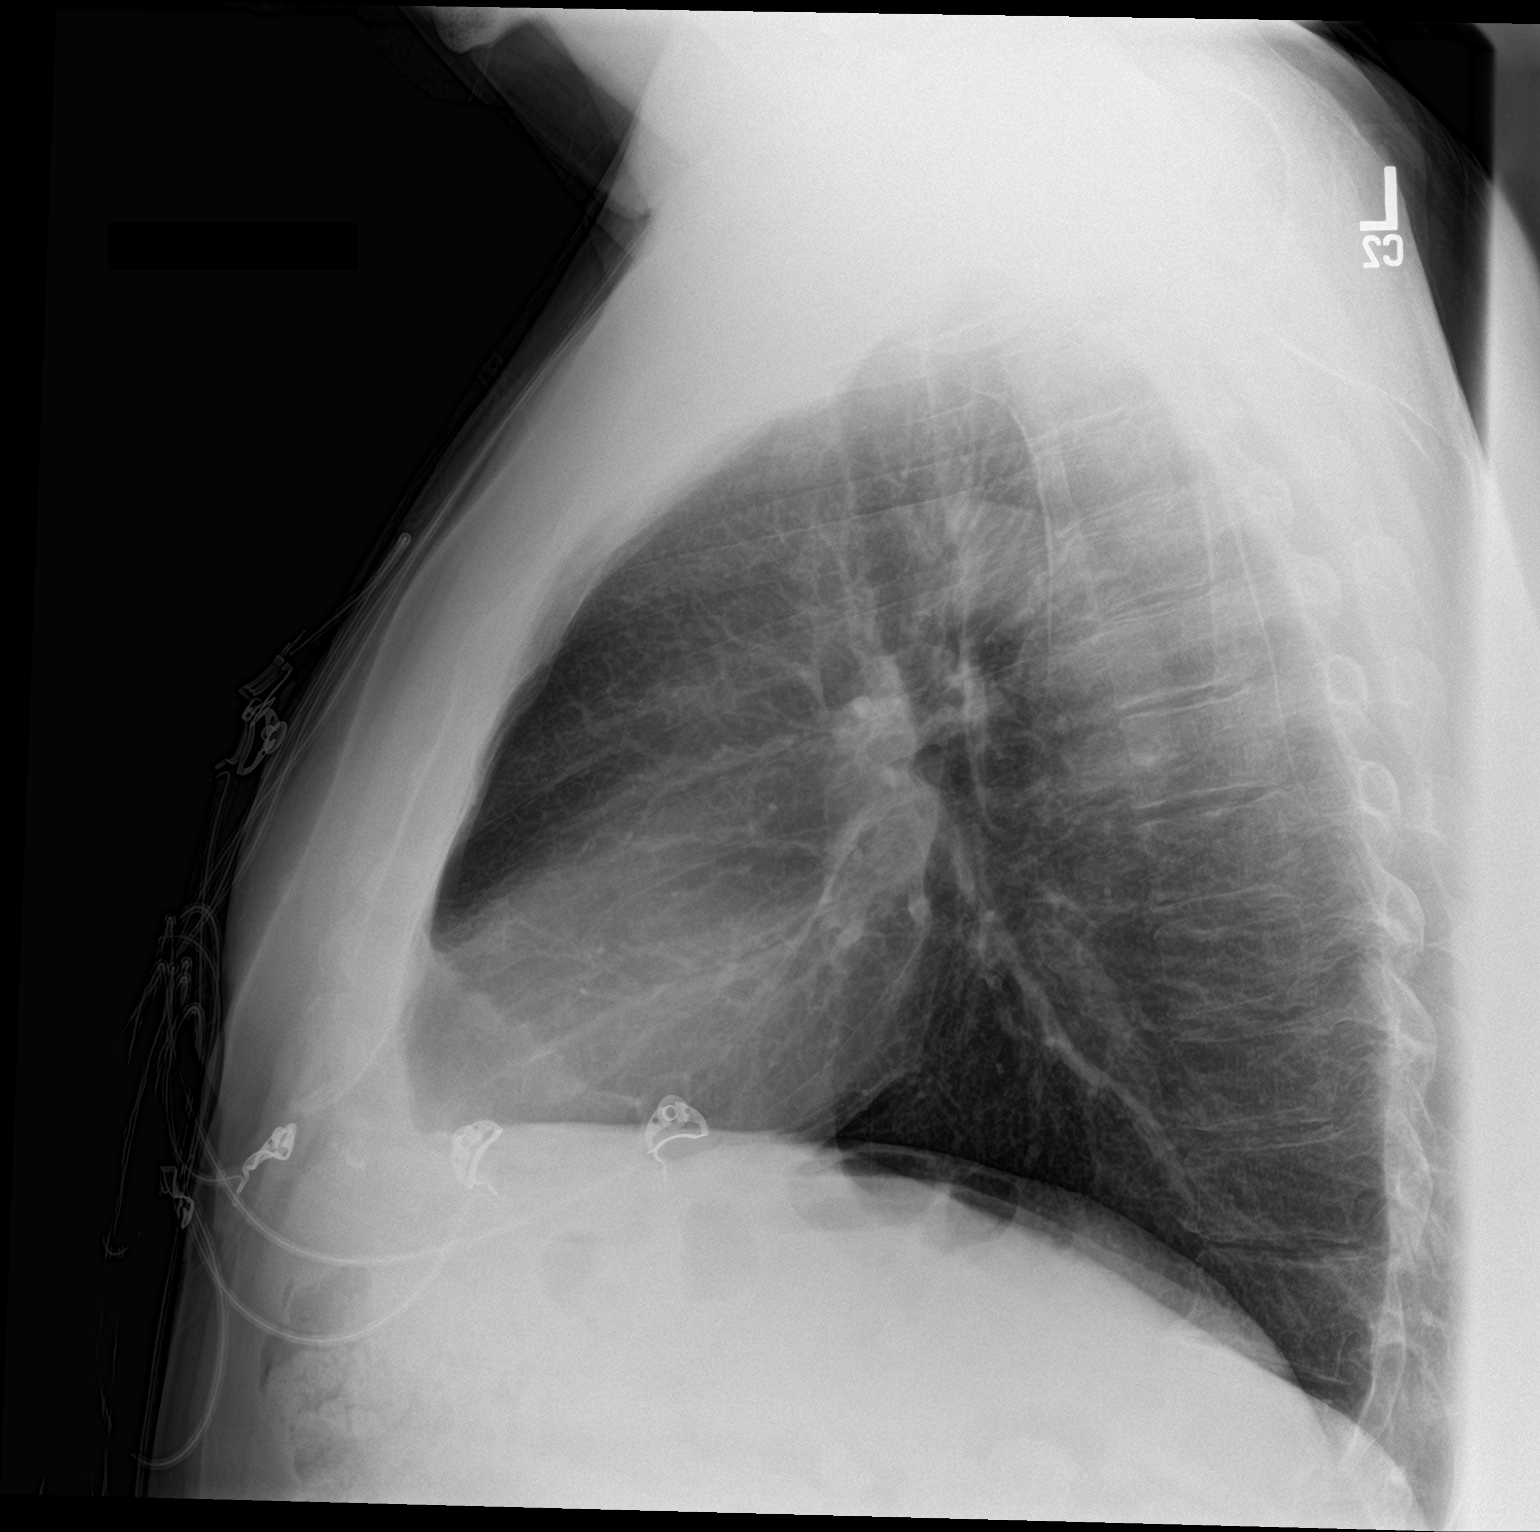

[2 of 2 positions shown; findings below may reference images not displayed]

FINDINGS: The cardiomediastinal contours are normal. Improved bronchial
thickening from prior. Pulmonary vasculature is normal. No
consolidation, pleural effusion, or pneumothorax. No acute osseous
abnormalities are seen.
IMPRESSION: No acute chest findings.

## 2018-11-29 MED ORDER — RIVAROXABAN 15 MG PO TABS: 15.0000 mg | ORAL_TABLET | Freq: Once | ORAL | Status: DC

## 2018-11-29 MED ORDER — DILTIAZEM HCL ER COATED BEADS 120 MG PO CP24: 120.0000 mg | ORAL_CAPSULE | Freq: Every day | ORAL | 0 refills | Status: DC

## 2018-11-29 MED ORDER — RIVAROXABAN 20 MG PO TABS
20.0000 mg | ORAL_TABLET | Freq: Once | ORAL | Status: AC
  Administered 2018-11-29: 20 mg via ORAL
  Filled 2018-11-29 (×2): qty 1

## 2018-11-29 MED ORDER — DILTIAZEM HCL 25 MG/5ML IV SOLN
5.0000 mg | Freq: Once | INTRAVENOUS | Status: AC
  Administered 2018-11-29: 5 mg via INTRAVENOUS
  Filled 2018-11-29: qty 5

## 2018-11-29 MED ORDER — RIVAROXABAN 20 MG PO TABS: 20.0000 mg | ORAL_TABLET | Freq: Every day | ORAL | 0 refills | Status: DC

## 2018-11-29 NOTE — ED Notes (Signed)
Patient transported to X-ray 

## 2018-11-29 NOTE — ED Notes (Signed)
Messaged main pharm to get med.

## 2018-11-29 NOTE — ED Triage Notes (Signed)
Per [facility] pt coming from home with new onset atrial fibrillation. Patient reports he started feeling dizzy about 3 hours ago, looked on his apple watch and saw he was in A-fib. Patient went to PCP and had EKG showing A-fib. Per EMS pt HR ranging from 100-150. Denies any pain or shortness of breath.

## 2018-11-29 NOTE — ED Provider Notes (Signed)
Medical screening examination/treatment/procedure(s) were conducted as a shared visit with non-physician practitioner(s) and myself.  I personally evaluated the patient during the encounter.  EKG Interpretation  Date/Time:  Friday November 29 2018 17:55:17 EDT Ventricular Rate:  114 PR Interval:    QRS Duration: 133 QT Interval:  329 QTC Calculation: 453 R Axis:   84 Text Interpretation:  Atrial fibrillation vs a flutter Right bundle branch block Since last tracing rate faster Confirmed by Dorie Rank [phone]) on 11/29/2018 6:02:04 PM  New onset a fib.  Rate controlled in the ED.   Discussed with cardiology, [name].  Will dc home on cardizem.  Discussed anticoagulation with patient.  Will dc home on xarelto.  Discussed warning signs and precautions with that medication. Follow up with cardiology.     Dorie Rank, MD 11/29/18 2020

## 2018-11-29 NOTE — Discharge Instructions (Addendum)
As we discussed, please take Cardizem as directed.   Take Xarelto as directed.   Follow-up with Cardiology.  They will plan to see in the office this week.  If you have not heard from them the next 4 days, please call their office to arrange an outpatient appointment.  Return to the Emergency Dept any chest pain, trouble breathing, feeling lightheaded, feeling the palpitation or any other worsening or concerning symptoms.  Information on my medicine - XARELTO (Rivaroxaban)  This medication education was reviewed with me or my healthcare representative as part of my discharge preparation.    Why was Xarelto prescribed for you? Xarelto was prescribed for you to reduce the risk of a blood clot forming that can cause a stroke if you have a medical condition called atrial fibrillation (a type of irregular heartbeat).  What do you need to know about xarelto ? Take your Xarelto ONCE DAILY at the same time every day with your evening meal. If you have difficulty swallowing the tablet whole, you may crush it and mix in applesauce just prior to taking your dose.  Take Xarelto exactly as prescribed by your doctor and DO NOT stop taking Xarelto without talking to the doctor who prescribed the medication.  Stopping without other stroke prevention medication to take the place of Xarelto may increase your risk of developing a clot that causes a stroke.  Refill your prescription before you run out.  After discharge, you should have regular check-up appointments with your healthcare provider that is prescribing your Xarelto.  In the future your dose may need to be changed if your kidney function or weight changes by a significant amount.  What do you do if you miss a dose? If you are taking Xarelto ONCE DAILY and you miss a dose, take it as soon as you remember on the same day then continue your regularly scheduled once daily regimen the next day. Do not take two doses of Xarelto at the same time  or on the same day.   Important Safety Information A possible side effect of Xarelto is bleeding. You should call your healthcare provider right away if you experience any of the following: ? Bleeding from an injury or your nose that does not stop. ? Unusual colored urine (red or dark brown) or unusual colored stools (red or black). ? Unusual bruising for unknown reasons. ? A serious fall or if you hit your head (even if there is no bleeding).  Some medicines may interact with Xarelto and might increase your risk of bleeding while on Xarelto. To help avoid this, consult your healthcare provider or pharmacist prior to using any new prescription or non-prescription medications, including herbals, vitamins, non-steroidal anti-inflammatory drugs (NSAIDs) and supplements.  This website has more information on Xarelto: [email or web address]-[email or web address].[email or web address]

## 2018-11-29 NOTE — ED Provider Notes (Signed)
[facility] EMERGENCY DEPARTMENT Provider Note   CSN: [id number] Arrival date & time: 11/29/18  1750    History   Chief Complaint Chief Complaint  Patient presents with  . Atrial Fibrillation    HPI Cory Mathis is a 68 y.o. male brought in by [facility] for evaluation of palpitations.  Patient reports that about 2 hours ago, he was at home and felt palpitations and lightheadedness.  He noted on his apple watch that is that he was in A. fib and his heart rate was in the 140s.  He went to PCP where he had an EKG that showed A. fib.  EMS was called to transport patient.  Per EMS, heart rate ranging between 100-150 in route.  Patient states that he does not have any chest pain or difficulty breathing.  He states that this is never happened to him before.  He states he has a history of right bundle branch block but no other cardiac history.  Patient states he was in his normal state of health.  He denies any fevers, cough, congestion, shortness of breath, nausea/vomiting, numbness/weakness of his arms or legs.     The history is provided by the patient.    Past Medical History:  Diagnosis Date  . BPH (benign prostatic hyperplasia)   . Diverticulosis of colon   . GERD (gastroesophageal reflux disease)    prn med. control  . History of adenomatous polyp of colon    03-29-2007  tubular adenoma/  08-07-2016  hyperplastic and tubular adenoma's  . History of alcoholic gastritis    20-25-4270  gastritis, esophagitis, peptic duodenitis  . History of colonic diverticulitis    01-31-2016  resolved w/ medications  . History of Helicobacter pylori infection    10-30-2008  . 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 Internal hemorrhoids   . Memory loss   . OSA on CPAP    CPAP nightly  . Right thyroid nodule   . Urinary retention 12/13/2016    Patient Active Problem List   Diagnosis Date Noted  . Bradycardia 02/15/2018  .  Agatston coronary artery calcium score between 100 and 199 02/15/2018  . FUO (fever of unknown origin) 08/16/2017  . Goiter 08/16/2017  . Status post thyroidectomy 08/16/2017  . Hypertension 08/16/2017  . Dyslipidemia 08/16/2017  . Obstructive sleep apnea 08/16/2017  . Memory loss 07/25/2017  . Enlarged prostate with urinary obstruction 01/04/2017  . Prolapsed internal hemorrhoids, grade 3 09/13/2016  . Eustachian tube dysfunction, bilateral 05/19/2016    Past Surgical History:  Procedure Laterality Date  . COLONOSCOPY  last one 08-07-2016  . HEMORRHOID BANDING  10-20-2016;  09-11-2016  . HERNIA REPAIR    . KNEE ARTHROSCOPY Bilateral    10 + yrs ago  . LASIK Bilateral   . PILONIDAL CYST EXCISION    . THYROIDECTOMY Left 2013  . TRANSURETHRAL RESECTION OF PROSTATE N/A 01/04/2017   Procedure: TRANSURETHRAL RESECTION OF THE PROSTATE (TURP);  Surgeon: Franchot Gallo, MD;  Location: [facility];  Service: Urology;  Laterality: N/A;  . UPPER GASTROINTESTINAL ENDOSCOPY  10/30/2008  . WISDOM TOOTH EXTRACTION          Home Medications    Prior to Admission medications   Medication Sig Start Date End Date Taking? Authorizing Provider  amLODipine (NORVASC) 5 MG tablet Take 1 tablet (5 mg total) by mouth 2 (two) times daily. 05/20/18  Yes Dorris Carnes  V, MD  loratadine (CLARITIN) 10 MG tablet Take 10 mg by mouth daily.   Yes [provider]  pantoprazole (PROTONIX) 40 MG tablet Take 1 tablet (40 mg total) by mouth every morning. 07/16/18  Yes Esterwood, Amy S, PA-C  Probiotic Product (PROBIOTIC PO) Take 1 tablet by mouth daily.   Yes [provider]  zolpidem (AMBIEN CR) 12.5 MG CR tablet Take 12.5 mg by mouth at bedtime as needed for sleep.   Yes [provider]  amoxicillin-clavulanate (AUGMENTIN) 875-125 MG tablet Take 1 tablet by mouth twice daily with food. Take as directed. Patient not taking: Reported on 11/29/2018 07/08/18   Esterwood, Amy  S, PA-C  diltiazem (CARDIZEM CD) 120 MG 24 hr capsule Take 1 capsule (120 mg total) by mouth daily for 21 days. 11/29/18 12/20/18  Volanda Napoleon, PA-C  nystatin (MYCOSTATIN) 100000 UNIT/ML suspension Take by mouth 5 cc's 4 times daily. Swish and swallow. Patient not taking: Reported on 11/29/2018 07/08/18   Esterwood, Amy S, PA-C  rivaroxaban (XARELTO) 20 MG TABS tablet Take 1 tablet (20 mg total) by mouth daily with supper. 11/29/18   Volanda Napoleon, PA-C    Family History Family History  Problem Relation Age of Onset  . Heart attack Mother        13's  . Other Father        atherosclerosis - 24's  . Prostate cancer Maternal Grandfather   . Other Sister        brain tumor - unsure if it was cancer - 91's  . Colon cancer Neg Hx   . Esophageal cancer Neg Hx   . Pancreatic cancer Neg Hx   . Rectal cancer Neg Hx   . Stomach cancer Neg Hx     Social History Social History   Tobacco Use  . Smoking status: Former Research scientist (life sciences)  . Smokeless tobacco: Never Used  Substance Use Topics  . Alcohol use: Yes    Comment: social   . Drug use: No     Allergies   Sulfa antibiotics, Sulfasalazine, Metronidazole, and Other   Review of Systems Review of Systems  Constitutional: Negative for fever.  Respiratory: Negative for cough and shortness of breath.   Cardiovascular: Positive for palpitations. Negative for chest pain.  Gastrointestinal: Negative for abdominal pain, nausea and vomiting.  Genitourinary: Negative for dysuria.  Neurological: Positive for light-headedness. Negative for headaches.  All other systems reviewed and are negative.    Physical Exam Updated Vital Signs BP 137/82   Pulse 95   Resp 12   Ht 5\' 11"  (1.803 m)   Wt 93.9 kg   SpO2 97%   BMI 28.87 kg/m   Physical Exam Vitals signs and nursing note reviewed.  Constitutional:      Appearance: Normal appearance. He is well-developed.  HENT:     Head: Normocephalic and atraumatic.  Eyes:     General: Lids  are normal.     Conjunctiva/sclera: Conjunctivae normal.     Pupils: Pupils are equal, round, and reactive to light.  Neck:     Musculoskeletal: Full passive range of motion without pain.  Cardiovascular:     Rate and Rhythm: Tachycardia present. Rhythm irregularly irregular.     Pulses: Normal pulses.     Heart sounds: Normal heart sounds. No murmur. No friction rub. No gallop.   Pulmonary:     Effort: Pulmonary effort is normal.     Breath sounds: Normal breath sounds.  Comments: Lungs clear to auscultation bilaterally.  Symmetric chest rise.  No wheezing, rales, rhonchi. Abdominal:     Palpations: Abdomen is soft. Abdomen is not rigid.     Tenderness: There is no abdominal tenderness. There is no guarding.  Musculoskeletal: Normal range of motion.  Skin:    General: Skin is warm and dry.     Capillary Refill: Capillary refill takes less than 2 seconds.  Neurological:     Mental Status: He is alert and oriented to person, place, and time.  Psychiatric:        Speech: Speech normal.      ED Treatments / Results  Labs (all labs ordered are listed, but only abnormal results are displayed) Labs Reviewed  COMPREHENSIVE METABOLIC PANEL - Abnormal; Notable for the following components:      Result Value   Glucose, Bld 109 (*)    All other components within normal limits  CBC WITH DIFFERENTIAL/PLATELET    EKG EKG Interpretation  Date/Time:  Friday November 29 2018 17:55:17 EDT Ventricular Rate:  114 PR Interval:    QRS Duration: 133 QT Interval:  329 QTC Calculation: 453 R Axis:   84 Text Interpretation:  Atrial fibrillation vs a flutter Right bundle branch block Since last tracing rate faster Confirmed by Dorie Rank [phone]) on 11/29/2018 6:02:04 PM    Radiology Dg Chest 2 View  Result Date: 11/29/2018 CLINICAL DATA:  Atrial fibrillation. EXAM: CHEST - 2 VIEW COMPARISON:  Chest radiograph 12/14/2015 FINDINGS: The cardiomediastinal contours are normal. Improved  bronchial thickening from prior. Pulmonary vasculature is normal. No consolidation, pleural effusion, or pneumothorax. No acute osseous abnormalities are seen. IMPRESSION: No acute chest findings. Electronically Signed   By: Keith Rake M.D.   On: 11/29/2018 19:06    Procedures Procedures (including critical care time)  Medications Ordered in ED Medications  diltiazem (CARDIZEM) injection 5 mg (5 mg Intravenous Given 11/29/18 1811)  rivaroxaban (XARELTO) tablet 20 mg (20 mg Oral Given 11/29/18 2100)     Initial Impression / Assessment and Plan / ED Course  I have reviewed the triage vital signs and the nursing notes.  Pertinent labs & imaging results that were available during my care of the patient were reviewed by me and considered in my medical decision making (see chart for details).        68 year old male who presents for evaluation of palpitations and lightheadedness x2 hours ago.  Went to primary care doctor where EKG showed A. fib.  No history of A. fib.  Was prompted to go to the ED for further evaluation.  Denies any chest pain, trouble breathing. Patient is afebrile, non-toxic appearing, sitting comfortably on examination table. Vital signs reviewed and stable.  On exam, he is tachycardic with heart rate ranging between 120-140.  Does appear to be irregularly irregular.  Concern for A. fib.  We will plan to check labs, chest x-ray, EKG.  CBC shows no leukocytosis. CMP shows no acute abnormalities.   Discussed patient with Dr. Governor Specking (Cardiology).  Improvement here in the ED, agrees the patient can be discharged on oral Cardizem.  She will plan to have patient follow-up with cardiology in the outpatient office this week.  Reevaluation.  Patient's heart rate consistently between 80 and 95.  He still appears to be irregular.  We will plan to send home on Cardizem.  Additionally, given A. fib and chads vas, will plan to start him on Xarelto.  I discussed with patient risk  versus benefits  of Xarelto.  He is agreeable. At this time, patient exhibits no emergent life-threatening condition that require further evaluation in ED or admission. Discussed patient with Dr. Tomi Bamberger who is agreeable.  Patient had ample opportunity for questions and discussion. All patient's questions were answered with full understanding. Strict return precautions discussed. Patient expresses understanding and agreement to plan.   CHADSVACS: 3  Portions of this note were generated with Lobbyist. Dictation errors may occur despite best attempts at proofreading.   Final Clinical Impressions(s) / ED Diagnoses   Final diagnoses:  Atrial fibrillation, unspecified type ([place])  New onset a-fib [facility])    ED Discharge Orders         Ordered    diltiazem (CARDIZEM CD) 120 MG 24 hr capsule  Daily     11/29/18 1950    rivaroxaban (XARELTO) 20 MG TABS tablet  Daily with supper     11/29/18 1950           Desma Mcgregor 11/30/18 2254    Dorie Rank, MD 12/01/18 1302

## 2018-11-29 NOTE — ED Notes (Signed)
Patient verbalizes understanding of discharge instructions. Opportunity for questioning and answers were provided. Armband removed by staff, pt discharged from ED.  

## 2018-12-02 ENCOUNTER — Telehealth: Payer: Self-pay | Admitting: Internal Medicine

## 2018-12-02 MED ORDER — METOPROLOL TARTRATE 25 MG PO TABS: 25.0000 mg | ORAL_TABLET | Freq: Two times a day (BID) | ORAL | 3 refills | Status: DC

## 2018-12-02 NOTE — Telephone Encounter (Signed)
See other phone note this has been addressed .Adonis Housekeeper

## 2018-12-02 NOTE — Telephone Encounter (Signed)
  Patient had to go to ER over the weekend with AFib and he is still in Afib. He would like to make sure [name] looks over notes from his visit. I have made a virtual visit with Richardson Dopp on Wednesday but patient would like to come in to be seen if at all possible.

## 2018-12-02 NOTE — Telephone Encounter (Signed)
Called patient to discuss recent ED visit. He reports HR has been primarily in the 70s-80s (baseline 50s). He states he can still tell he is in atrial fibrillation. He was discharged on diltiazem 120mg  daily for rate control and xarelto for stroke ppx, in addition to his home amlodipine for HTN. Patient was instructed to stop diltiazem at this time. Rx for metoprolol tartrate 25mg  BID was sent to his pharmacy to start tomorrow. He is free to go about normal activity as long as he is not experiencing chest pain, SOB, DOE, dizziness, or lightheadedness. He was cleared to drink a cup of coffee a day. We discussed anticipated further work-up/management including an echo, stress test, and possible cardioversion. He reports compliance with his xarelto and no bleeding problems.  He is scheduled for follow-up with Robbie Lis, PA-C 12/11/2018.   Abigail Butts, PA-C 12/02/18, 2:27 PM

## 2018-12-02 NOTE — Telephone Encounter (Signed)
Patient contacted regarding discharge from [facility] on 11/29/2018.  Patient understands to follow up with provider Robbie Lis, PA-c on 12/11/2018 at 10:30 at [facility] in [place]. Patient understands discharge instructions? Yes Patient understands medications and regiment? Yes Patient understands to bring all medications to this visit? Yes   The pt does have 2 questions that he wants me to ask for him: 1. What level of physical activity should he be following? 2. Can he have 1 cup of coffee in the mornings?  Please advise.

## 2018-12-02 NOTE — Telephone Encounter (Signed)
Per pt was seen at [facility] on Friday and was sent to ED via ambulance with AFIB was started on Diltiazem 120 mg and Xarelto 20 mg per pt no symptoms. Pt's heart rate currently ranging 60 -90 Pt has virtual visit with [name] on Wed would like a actual visit in office and also would like to make [name] aware. Will forward to [name] for review .Cory Mathis

## 2018-12-02 NOTE — Telephone Encounter (Signed)
New Message     Called pt to schedule office visit appt per staff message.  Primary Cardiologist: Dr. Harrington Challenger  Date of Discharge: 11/29/2018  Appointment Needed Within: 1-2 weeks  Appointment Type: Please schedule an in-office visit with Dr. Elon Jester for within 1-2 weeks for new onset atrial fibrillation.   Thank you!   Roby Lofts, PA-C    He is wondering if it is okay to wait until July 1st to be seen in the office and wanted [name] to decide    Please call

## 2018-12-02 NOTE — Telephone Encounter (Signed)
Since July 1st is within the 14 days that Roby Lofts, PA-c recommends that the pt F/u in it is ok to schedule on July 1st. Thanks.

## 2018-12-04 ENCOUNTER — Telehealth: Payer: Medicare Other | Admitting: Physician Assistant

## 2018-12-09 DIAGNOSIS — G4700 Insomnia, unspecified: Secondary | ICD-10-CM | POA: Diagnosis not present

## 2018-12-10 ENCOUNTER — Telehealth: Payer: Self-pay | Admitting: Physician Assistant

## 2018-12-10 NOTE — Telephone Encounter (Signed)
New Message     Pt is wondering if he needs to have labs before his appt tomorrow     Please call back

## 2018-12-10 NOTE — Telephone Encounter (Signed)
New Message     Left message to confirm appt and Answer Covid questions

## 2018-12-10 NOTE — Telephone Encounter (Signed)

## 2018-12-10 NOTE — Telephone Encounter (Signed)
Returned pts call and has been made aware that if the provider wanted any labs, he would order at the visit, since it is a hospital f/u. Pt thanked me for the return call.

## 2018-12-11 ENCOUNTER — Ambulatory Visit (INDEPENDENT_AMBULATORY_CARE_PROVIDER_SITE_OTHER): Payer: Medicare Other | Admitting: Physician Assistant

## 2018-12-11 ENCOUNTER — Encounter: Payer: Self-pay | Admitting: Physician Assistant

## 2018-12-11 ENCOUNTER — Encounter: Payer: Self-pay | Admitting: *Deleted

## 2018-12-11 ENCOUNTER — Other Ambulatory Visit: Payer: Self-pay

## 2018-12-11 VITALS — BP 128/74 | HR 76 | Ht 71.0 in | Wt 207.8 lb

## 2018-12-11 DIAGNOSIS — I4819 Other persistent atrial fibrillation: Secondary | ICD-10-CM

## 2018-12-11 DIAGNOSIS — R931 Abnormal findings on diagnostic imaging of heart and coronary circulation: Secondary | ICD-10-CM

## 2018-12-11 DIAGNOSIS — I10 Essential (primary) hypertension: Secondary | ICD-10-CM

## 2018-12-11 DIAGNOSIS — E785 Hyperlipidemia, unspecified: Secondary | ICD-10-CM | POA: Diagnosis not present

## 2018-12-11 DIAGNOSIS — R001 Bradycardia, unspecified: Secondary | ICD-10-CM

## 2018-12-11 NOTE — H&P (View-Only) (Signed)
Cardiology Office Note    Date:  12/11/2018   ID:  Cory Mathis, DOB [birth year], MRN [id number]  PCP:  [name], MD  Cardiologist:  Dr. Harrington Challenger  Chief Complaint: ER follow up   History of Present Illness:   Cory Mathis is a 68 y.o. male with histroy of anxiety, HTN, RBBB, sleep apnea, GERD, palpitaitons, HLD who recently dx with afib seen for follow up.   He was seen by Dr. Wynonia Lawman in the past who recommended statin due to Ca score of 168. Declined. Then established care with Dr. Harrington Challenger. He was doing well on cardiac stand point when last seen 05/2018.  Seen in ER 11/29/2018 for palpitation. Noted to have atrial fibrillation. HR 120-140 which improved with IV cardizem. Discharged home on Xarelto and Cardizem CD with plan for outpatient follow up. cardizem was discontinued as he was on amlodipine and started on metoprolol 25mg  BID.   Here today for follow-up.  Heart rate stable in the 70s-80s at rest, goes to 110s on ambulation.  Intermittent chest fluttering sensation.  Before his initial episode of atrial fibrillation, patient was very active.  He was doing elliptical and walking on a treadmill on a regular basis.  No exertional symptoms.  Patient denies chest pain, orthopnea, PND, syncope, lower extremity edema, melena or blood in the stool or urine.  No cough, fever, chills, congestion or COVID exposure.  Past Medical History:  Diagnosis Date   BPH (benign prostatic hyperplasia)    Diverticulosis of colon    GERD (gastroesophageal reflux disease)    prn med. 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   Internal hemorrhoids    Memory loss    OSA on CPAP    CPAP nightly   Right thyroid nodule    Urinary retention 12/13/2016    Past Surgical History:  Procedure Laterality Date   COLONOSCOPY  last one 08-07-2016   HEMORRHOID BANDING  10-20-2016;  09-11-2016   HERNIA REPAIR     KNEE ARTHROSCOPY Bilateral    10 + yrs ago   LASIK Bilateral    PILONIDAL CYST EXCISION     THYROIDECTOMY Left 2013   TRANSURETHRAL RESECTION OF PROSTATE N/A 01/04/2017   Procedure: TRANSURETHRAL RESECTION OF THE PROSTATE (TURP);  Surgeon: Franchot Gallo, MD;  Location: [facility];  Service: Urology;  Laterality: N/A;   UPPER GASTROINTESTINAL ENDOSCOPY  10/30/2008   WISDOM TOOTH EXTRACTION      Current Medications:  Prior to Admission medications   Medication Sig Start Date End Date Taking? Authorizing Provider  amLODipine (NORVASC) 5 MG tablet Take 1 tablet (5 mg total) by mouth 2 (two) times daily. 05/20/18  Yes Fay Records, MD  metoprolol tartrate (LOPRESSOR) 25 MG tablet Take 1 tablet (25 mg total) by mouth 2 (two) times daily. 12/02/18 03/02/19 Yes Kroeger, Daleen Snook M., PA-C  pantoprazole (PROTONIX) 40 MG tablet Take 1 tablet (40 mg total) by mouth every morning. 07/16/18  Yes Esterwood, Amy S, PA-C  Probiotic Product (PROBIOTIC PO) Take 1 tablet by mouth daily.  Yes [provider]  rivaroxaban (XARELTO) 20 MG TABS tablet Take 1 tablet (20 mg total) by mouth daily with supper. 11/29/18  Yes Volanda Napoleon, PA-C  zolpidem (AMBIEN CR) 12.5 MG CR tablet Take 12.5 mg by mouth at bedtime as needed for sleep.   Yes [provider]     Allergies:   Sulfa antibiotics, Sulfasalazine, Metronidazole, and Other   Social History   Socioeconomic History   Marital status: Married    Spouse name: Not on file   Number of children: 0   Years of education: 16+   Highest education level: Bachelor's degree (e.g., BA, AB, BS)    Occupational History   Occupation: Retired  Scientist, product/process development strain: Not on file   Food insecurity    Worry: Not on file    Inability: Not on Lexicographer needs    Medical: Not on file    Non-medical: Not on file  Tobacco Use   Smoking status: Former Smoker   Smokeless tobacco: Never Used  Substance and Sexual Activity   Alcohol use: Yes    Comment: social    Drug use: No   Sexual activity: Not on file  Lifestyle   Physical activity    Days per week: Not on file    Minutes per session: Not on file   Stress: Not on file  Relationships   Social connections    Talks on phone: Not on file    Gets together: Not on file    Attends religious service: Not on file    Active member of club or organization: Not on file    Attends meetings of clubs or organizations: Not on file    Relationship status: Not on file  Other Topics Concern   Not on file  Social History Narrative   Left-handed.   1.5 cups caffeine per day.     Family History:  The patient's family history includes Heart attack in his mother; Other in his father and sister; Prostate cancer in his maternal grandfather.   ROS:   Please see the history of present illness.    ROS All other systems reviewed and are negative.   PHYSICAL EXAM:   VS:  BP 128/74    Pulse 76    Ht 5\' 11"  (1.803 m)    Wt 207 lb 12.8 oz (94.3 kg)    SpO2 98%    BMI 28.98 kg/m    GEN: Well nourished, well developed, in no acute distress  HEENT: normal  Neck: no JVD, carotid bruits, or masses Cardiac: Irregularly irregular; no murmurs, rubs, or gallops,no edema  Respiratory:  clear to auscultation bilaterally, normal work of breathing GI: soft, nontender, nondistended, + BS MS: no deformity or atrophy  Skin: warm and dry, no rash Neuro:  Alert and Oriented x 3, Strength and sensation are intact Psych: euthymic mood, full affect  Wt Readings from Last 3 Encounters:  12/11/18 207 lb 12.8 oz (94.3 kg)   11/29/18 207 lb (93.9 kg)  07/08/18 208 lb (94.3 kg)      Studies/Labs Reviewed:   EKG:  EKG is ordered today.  The ekg ordered today demonstrates atrial fibrillation at rate of 76 bpm  Recent Labs: 01/25/2018: TSH 1.200 11/29/2018: ALT 15; BUN 12; Creatinine, Ser 1.16; Hemoglobin 15.0; Platelets 310; Potassium 3.6; Sodium 139   Lipid Panel No results found for: CHOL, TRIG, HDL, CHOLHDL, VLDL, LDLCALC, LDLDIRECT  Additional studies/ records that were  reviewed today include:   48 hour holter SInus rhythm  36 to 90 bpm   Average HR 51 bpm Longest pause 1.7 sec Rare PAC  Short burst PAT   Rare PVC  ASSESSMENT & PLAN:    1. Atrial fibrillation - CHADSVASC score of 2 for HTN and age.  Rate stable.  Compliant with Xarelto.  No bleeding issue.  Minimally symptomatic.  I have spent at least 30 minutes discussing etiology, progression, treatment and lifestyle changes in detail.  Will check TSH and echocardiogram.  Plan cardioversion after 3 weeks of anticoagulation.  Patient prefers to see Dr. Harrington Challenger.  Will schedule cardioversion with Dr. Harrington Challenger if possible otherwise follow-up with her.  2. HTN -Blood pressure stable on current medication.  3. HLD - No results found for requested labs within last 8760 hours. -Prefers lifestyle changes.  4.  GERD -Intermittent exacerbation.  On Protonix 40 mg daily.   Medication Adjustments/Labs and Tests Ordered: Current medicines are reviewed at length with the patient today.  Concerns regarding medicines are outlined above.  Medication changes, Labs and Tests ordered today are listed in the Patient Instructions below. Patient Instructions  Medication Instructions:  Your physician recommends that you continue on your current medications as directed. Please refer to the Current Medication list given to you today.   If you need a refill on your cardiac medications before your next appointment, please call your pharmacy.   Lab work: TODAY:  BME,  CBC, & TSH  If you have labs (blood work) drawn today and your tests are completely normal, you will receive your results only by:  [place] (if you have MyChart) OR  A paper copy in the mail If you have any lab test that is abnormal or we need to change your treatment, we will call you to review the results.  Your Pre-procedure COVID-19 Testing will be done on 12/20/2018 at [facility] at [address] [place], [place] [address] After your swab you will be given a mask to wear and instructed to go home and quarantine/no visitors until after your procedure. If you test positive you will be notified and your procedure will be cancelled.    Testing/Procedures: Your physician has recommended that you have a Cardioversion (DCCV). Electrical Cardioversion uses a jolt of electricity to your heart either through paddles or wired patches attached to your chest. This is a controlled, usually prescheduled, procedure. Defibrillation is done under light anesthesia in the hospital, and you usually go home the day of the procedure. This is done to get your heart back into a normal rhythm. You are not awake for the procedure. Please see the instruction sheet given to you today.   Your physician has requested that you have an echocardiogram 12/20/2018 AT 8:30.  Please arrive at 8:15 for this appointment.  Echocardiography is a painless test that uses sound waves to create images of your heart. It provides your doctor with information about the size and shape of your heart and how well your hearts chambers and valves are working. This procedure takes approximately one hour. There are no restrictions for this procedure.   Follow-Up: At [facility], you and your health needs are our priority.  As part of our continuing mission to provide you with exceptional heart care, we have created designated Provider Care Teams.  These Care Teams include your primary  Cardiologist (physician) and Advanced Practice Providers (APPs -  Physician Assistants and Nurse Practitioners)  who all work together to provide you with the care you need, when you need it. You will need a follow up appointment in:  01/13/2019 9:45.  Please arrive 15 mins early to this appointment  Any Other Special Instructions Will Be Listed Below (If Applicable).  Electrical Cardioversion  Electrical cardioversion is the delivery of a jolt of electricity to restore a normal rhythm to the heart. A rhythm that is too fast or is not regular keeps the heart from pumping well. In this procedure, sticky patches or metal paddles are placed on the chest to deliver electricity to the heart from a device. This procedure may be done in an emergency if:  There is low or no blood pressure as a result of the heart rhythm.  Normal rhythm must be restored as fast as possible to protect the brain and heart from further damage.  It may save a life. This procedure may also be done for irregular or fast heart rhythms that are not immediately life-threatening. Tell a health care provider about:  Any allergies you have.  All medicines you are taking, including vitamins, herbs, eye drops, creams, and over-the-counter medicines.  Any problems you or family members have had with anesthetic medicines.  Any blood disorders you have.  Any surgeries you have had.  Any medical conditions you have.  Whether you are pregnant or may be pregnant. What are the risks? Generally, this is a safe procedure. However, problems may occur, including:  Allergic reactions to medicines.  A blood clot that breaks free and travels to other parts of your body.  The possible return of an abnormal heart rhythm within hours or days after the procedure.  Your heart stopping (cardiac arrest). This is rare. What happens before the procedure? Medicines  Your health care provider may have you start taking: ? Blood-thinning  medicines (anticoagulants) so your blood does not clot as easily. ? Medicines may be given to help stabilize your heart rate and rhythm.  Ask your health care provider about changing or stopping your regular medicines. This is especially important if you are taking diabetes medicines or blood thinners. General instructions  Plan to have someone take you home from the hospital or clinic.  If you will be going home right after the procedure, plan to have someone with you for 24 hours.  Follow instructions from your health care provider about eating or drinking restrictions. What happens during the procedure?  To lower your risk of infection: ? Your health care team will wash or sanitize their hands. ? Your skin will be washed with soap.  An IV tube will be inserted into one of your veins.  You will be given a medicine to help you relax (sedative).  Sticky patches (electrodes) or metal paddles may be placed on your chest.  An electrical shock will be delivered. The procedure may vary among health care providers and hospitals. What happens after the procedure?   Your blood pressure, heart rate, breathing rate, and blood oxygen level will be monitored until the medicines you were given have worn off.  Do not drive for 24 hours if you were given a sedative.  Your heart rhythm will be watched to make sure it does not change. This information is not intended to replace advice given to you by your health care provider. Make sure you discuss any questions you have with your health care provider. Document Released: 05/19/2002 Document Revised: 05/11/2017 Document Reviewed: 12/03/2015 Elsevier Patient Education  2020 Elsevier  [address]       Mahalia Longest [place], [place]  12/11/2018 11:48 AM    [facility] [place], [place], [place]  [address] Phone: [phone]; Fax: [phone]

## 2018-12-11 NOTE — Progress Notes (Signed)
Cardiology Office Note    Date:  12/11/2018   ID:  Cory Mathis, DOB March 16, 1951, MRN [id number]  PCP:  [name], MD  Cardiologist:  Dr. Harrington Challenger  Chief Complaint: ER follow up   History of Present Illness:   Cory Mathis is a 68 y.o. male with histroy of anxiety, HTN, RBBB, sleep apnea, GERD, palpitaitons, HLD who recently dx with afib seen for follow up.   He was seen by Dr. Wynonia Lawman in the past who recommended statin due to Ca score of 168. Declined. Then established care with Dr. Harrington Challenger. He was doing well on cardiac stand point when last seen 05/2018.  Seen in ER 11/29/2018 for palpitation. Noted to have atrial fibrillation. HR 120-140 which improved with IV cardizem. Discharged home on Xarelto and Cardizem CD with plan for outpatient follow up. cardizem was discontinued as he was on amlodipine and started on metoprolol 25mg  BID.   Here today for follow-up.  Heart rate stable in the 70s-80s at rest, goes to 110s on ambulation.  Intermittent chest fluttering sensation.  Before his initial episode of atrial fibrillation, patient was very active.  He was doing elliptical and walking on a treadmill on a regular basis.  No exertional symptoms.  Patient denies chest pain, orthopnea, PND, syncope, lower extremity edema, melena or blood in the stool or urine.  No cough, fever, chills, congestion or COVID exposure.  Past Medical History:  Diagnosis Date   BPH (benign prostatic hyperplasia)    Diverticulosis of colon    GERD (gastroesophageal reflux disease)    prn med. 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   Internal hemorrhoids    Memory loss    OSA on CPAP    CPAP nightly   Right thyroid nodule    Urinary retention 12/13/2016    Past Surgical History:  Procedure Laterality Date   COLONOSCOPY  last one 08-07-2016   HEMORRHOID BANDING  10-20-2016;  09-11-2016   HERNIA REPAIR     KNEE ARTHROSCOPY Bilateral    10 + yrs ago   LASIK Bilateral    PILONIDAL CYST EXCISION     THYROIDECTOMY Left 2013   TRANSURETHRAL RESECTION OF PROSTATE N/A 01/04/2017   Procedure: TRANSURETHRAL RESECTION OF THE PROSTATE (TURP);  Surgeon: Franchot Gallo, MD;  [facility];  Service: Urology;  Laterality: N/A;   UPPER GASTROINTESTINAL ENDOSCOPY  10/30/2008   WISDOM TOOTH EXTRACTION      Current Medications:  Prior to Admission medications   Medication Sig Start Date End Date Taking? Authorizing Provider  amLODipine (NORVASC) 5 MG tablet Take 1 tablet (5 mg total) by mouth 2 (two) times daily. 05/20/18  Yes Fay Records, MD  metoprolol tartrate (LOPRESSOR) 25 MG tablet Take 1 tablet (25 mg total) by mouth 2 (two) times daily. 12/02/18 03/02/19 Yes Kroeger, Daleen Snook M., PA-C  pantoprazole (PROTONIX) 40 MG tablet Take 1 tablet (40 mg total) by mouth every morning. 07/16/18  Yes Esterwood, Amy S, PA-C  Probiotic Product (PROBIOTIC PO) Take 1 tablet by mouth daily.  Yes [provider]  rivaroxaban (XARELTO) 20 MG TABS tablet Take 1 tablet (20 mg total) by mouth daily with supper. 11/29/18  Yes Volanda Napoleon, PA-C  zolpidem (AMBIEN CR) 12.5 MG CR tablet Take 12.5 mg by mouth at bedtime as needed for sleep.   Yes [provider]     Allergies:   Sulfa antibiotics, Sulfasalazine, Metronidazole, and Other   Social History   Socioeconomic History   Marital status: Married    Spouse name: Not on file   Number of children: 0   Years of education: 16+   Highest education level: Bachelor's degree (e.g., BA, AB, BS)    Occupational History   Occupation: Retired  Scientist, product/process development strain: Not on file   Food insecurity    Worry: Not on file    Inability: Not on Lexicographer needs    Medical: Not on file    Non-medical: Not on file  Tobacco Use   Smoking status: Former Smoker   Smokeless tobacco: Never Used  Substance and Sexual Activity   Alcohol use: Yes    Comment: social    Drug use: No   Sexual activity: Not on file  Lifestyle   Physical activity    Days per week: Not on file    Minutes per session: Not on file   Stress: Not on file  Relationships   Social connections    Talks on phone: Not on file    Gets together: Not on file    Attends religious service: Not on file    Active member of club or organization: Not on file    Attends meetings of clubs or organizations: Not on file    Relationship status: Not on file  Other Topics Concern   Not on file  Social History Narrative   Left-handed.   1.5 cups caffeine per day.     Family History:  The patient's family history includes Heart attack in his mother; Other in his father and sister; Prostate cancer in his maternal grandfather.   ROS:   Please see the history of present illness.    ROS All other systems reviewed and are negative.   PHYSICAL EXAM:   VS:  BP 128/74    Pulse 76    Ht 5\' 11"  (1.803 m)    Wt 207 lb 12.8 oz (94.3 kg)    SpO2 98%    BMI 28.98 kg/m    GEN: Well nourished, well developed, in no acute distress  HEENT: normal  Neck: no JVD, carotid bruits, or masses Cardiac: Irregularly irregular; no murmurs, rubs, or gallops,no edema  Respiratory:  clear to auscultation bilaterally, normal work of breathing GI: soft, nontender, nondistended, + BS MS: no deformity or atrophy  Skin: warm and dry, no rash Neuro:  Alert and Oriented x 3, Strength and sensation are intact Psych: euthymic mood, full affect  Wt Readings from Last 3 Encounters:  12/11/18 207 lb 12.8 oz (94.3 kg)   11/29/18 207 lb (93.9 kg)  07/08/18 208 lb (94.3 kg)      Studies/Labs Reviewed:   EKG:  EKG is ordered today.  The ekg ordered today demonstrates atrial fibrillation at rate of 76 bpm  Recent Labs: 01/25/2018: TSH 1.200 11/29/2018: ALT 15; BUN 12; Creatinine, Ser 1.16; Hemoglobin 15.0; Platelets 310; Potassium 3.6; Sodium 139   Lipid Panel No results found for: CHOL, TRIG, HDL, CHOLHDL, VLDL, LDLCALC, LDLDIRECT  Additional studies/ records that were  reviewed today include:   48 hour holter SInus rhythm  36 to 90 bpm   Average HR 51 bpm Longest pause 1.7 sec Rare PAC  Short burst PAT   Rare PVC  ASSESSMENT & PLAN:    1. Atrial fibrillation - CHADSVASC score of 2 for HTN and age.  Rate stable.  Compliant with Xarelto.  No bleeding issue.  Minimally symptomatic.  I have spent at least 30 minutes discussing etiology, progression, treatment and lifestyle changes in detail.  Will check TSH and echocardiogram.  Plan cardioversion after 3 weeks of anticoagulation.  Patient prefers to see Dr. Harrington Challenger.  Will schedule cardioversion with Dr. Harrington Challenger if possible otherwise follow-up with her.  2. HTN -Blood pressure stable on current medication.  3. HLD - No results found for requested labs within last 8760 hours. -Prefers lifestyle changes.  4.  GERD -Intermittent exacerbation.  On Protonix 40 mg daily.   Medication Adjustments/Labs and Tests Ordered: Current medicines are reviewed at length with the patient today.  Concerns regarding medicines are outlined above.  Medication changes, Labs and Tests ordered today are listed in the Patient Instructions below. Patient Instructions  Medication Instructions:  Your physician recommends that you continue on your current medications as directed. Please refer to the Current Medication list given to you today.   If you need a refill on your cardiac medications before your next appointment, please call your pharmacy.   Lab work: TODAY:  BME,  CBC, & TSH  If you have labs (blood work) drawn today and your tests are completely normal, you will receive your results only by:  [place] (if you have MyChart) OR  A paper copy in the mail If you have any lab test that is abnormal or we need to change your treatment, we will call you to review the results.  Your Pre-procedure COVID-19 Testing will be done on 12/20/2018 at [facility] at [address] [place], [place] [address] After your swab you will be given a mask to wear and instructed to go home and quarantine/no visitors until after your procedure. If you test positive you will be notified and your procedure will be cancelled.    Testing/Procedures: Your physician has recommended that you have a Cardioversion (DCCV). Electrical Cardioversion uses a jolt of electricity to your heart either through paddles or wired patches attached to your chest. This is a controlled, usually prescheduled, procedure. Defibrillation is done under light anesthesia in the hospital, and you usually go home the day of the procedure. This is done to get your heart back into a normal rhythm. You are not awake for the procedure. Please see the instruction sheet given to you today.   Your physician has requested that you have an echocardiogram 12/20/2018 AT 8:30.  Please arrive at 8:15 for this appointment.  Echocardiography is a painless test that uses sound waves to create images of your heart. It provides your doctor with information about the size and shape of your heart and how well your hearts chambers and valves are working. This procedure takes approximately one hour. There are no restrictions for this procedure.   Follow-Up: At [facility], you and your health needs are our priority.  As part of our continuing mission to provide you with exceptional heart care, we have created designated Provider Care Teams.  These Care Teams include your primary  Cardiologist (physician) and Advanced Practice Providers (APPs -  Physician Assistants and Nurse Practitioners)  who all work together to provide you with the care you need, when you need it. You will need a follow up appointment in:  01/13/2019 9:45.  Please arrive 15 mins early to this appointment  Any Other Special Instructions Will Be Listed Below (If Applicable).  Electrical Cardioversion  Electrical cardioversion is the delivery of a jolt of electricity to restore a normal rhythm to the heart. A rhythm that is too fast or is not regular keeps the heart from pumping well. In this procedure, sticky patches or metal paddles are placed on the chest to deliver electricity to the heart from a device. This procedure may be done in an emergency if:  There is low or no blood pressure as a result of the heart rhythm.  Normal rhythm must be restored as fast as possible to protect the brain and heart from further damage.  It may save a life. This procedure may also be done for irregular or fast heart rhythms that are not immediately life-threatening. Tell a health care provider about:  Any allergies you have.  All medicines you are taking, including vitamins, herbs, eye drops, creams, and over-the-counter medicines.  Any problems you or family members have had with anesthetic medicines.  Any blood disorders you have.  Any surgeries you have had.  Any medical conditions you have.  Whether you are pregnant or may be pregnant. What are the risks? Generally, this is a safe procedure. However, problems may occur, including:  Allergic reactions to medicines.  A blood clot that breaks free and travels to other parts of your body.  The possible return of an abnormal heart rhythm within hours or days after the procedure.  Your heart stopping (cardiac arrest). This is rare. What happens before the procedure? Medicines  Your health care provider may have you start taking: ? Blood-thinning  medicines (anticoagulants) so your blood does not clot as easily. ? Medicines may be given to help stabilize your heart rate and rhythm.  Ask your health care provider about changing or stopping your regular medicines. This is especially important if you are taking diabetes medicines or blood thinners. General instructions  Plan to have someone take you home from the hospital or clinic.  If you will be going home right after the procedure, plan to have someone with you for 24 hours.  Follow instructions from your health care provider about eating or drinking restrictions. What happens during the procedure?  To lower your risk of infection: ? Your health care team will wash or sanitize their hands. ? Your skin will be washed with soap.  An IV tube will be inserted into one of your veins.  You will be given a medicine to help you relax (sedative).  Sticky patches (electrodes) or metal paddles may be placed on your chest.  An electrical shock will be delivered. The procedure may vary among health care providers and hospitals. What happens after the procedure?   Your blood pressure, heart rate, breathing rate, and blood oxygen level will be monitored until the medicines you were given have worn off.  Do not drive for 24 hours if you were given a sedative.  Your heart rhythm will be watched to make sure it does not change. This information is not intended to replace advice given to you by your health care provider. Make sure you discuss any questions you have with your health care provider. Document Released: 05/19/2002 Document Revised: 05/11/2017 Document Reviewed: 12/03/2015 Elsevier Patient Education  2020 Elsevier  [address]       [place] Longest [place], [place]  12/11/2018 11:48 AM    [facility] [place], [place], [place]  [address] Phone: [phone]; Fax: [phone]

## 2018-12-11 NOTE — Patient Instructions (Addendum)
Medication Instructions:  Your physician recommends that you continue on your current medications as directed. Please refer to the Current Medication list given to you today.   If you need a refill on your cardiac medications before your next appointment, please call your pharmacy.   Lab work: TODAY:  BME, CBC, & TSH  If you have labs (blood work) drawn today and your tests are completely normal, you will receive your results only by: [place] MyChart Message (if you have MyChart) OR . A paper copy in the mail If you have any lab test that is abnormal or we need to change your treatment, we will call you to review the results.  Your Pre-procedure COVID-19 Testing will be done on 12/20/2018 at [facility] at [address] [place], [place] [address] After your swab you will be given a mask to wear and instructed to go home and quarantine/no visitors until after your procedure. If you test positive you will be notified and your procedure will be cancelled.    Testing/Procedures: Your physician has recommended that you have a Cardioversion (DCCV). Electrical Cardioversion uses a jolt of electricity to your heart either through paddles or wired patches attached to your chest. This is a controlled, usually prescheduled, procedure. Defibrillation is done under light anesthesia in the hospital, and you usually go home the day of the procedure. This is done to get your heart back into a normal rhythm. You are not awake for the procedure. Please see the instruction sheet given to you today.   Your physician has requested that you have an echocardiogram 12/20/2018 AT 8:30.  Please arrive at 8:15 for this appointment.  Echocardiography is a painless test that uses sound waves to create images of your heart. It provides your doctor with information about the size and shape of your heart and how well your heart's chambers and valves are working. This procedure takes  approximately one hour. There are no restrictions for this procedure.   Follow-Up: At [facility], you and your health needs are our priority.  As part of our continuing mission to provide you with exceptional heart care, we have created designated Provider Care Teams.  These Care Teams include your primary Cardiologist (physician) and Advanced Practice Providers (APPs -  Physician Assistants and Nurse Practitioners) who all work together to provide you with the care you need, when you need it. You will need a follow up appointment in:  01/13/2019 9:45.  Please arrive 15 mins early to this appointment  Any Other Special Instructions Will Be Listed Below (If Applicable).  Electrical Cardioversion  Electrical cardioversion is the delivery of a jolt of electricity to restore a normal rhythm to the heart. A rhythm that is too fast or is not regular keeps the heart from pumping well. In this procedure, sticky patches or metal paddles are placed on the chest to deliver electricity to the heart from a device. This procedure may be done in an emergency if:  There is low or no blood pressure as a result of the heart rhythm.  Normal rhythm must be restored as fast as possible to protect the brain and heart from further damage.  It may save a life. This procedure may also be done for irregular or fast heart rhythms that are not immediately life-threatening. Tell a health care provider about:  Any allergies you have.  All medicines you are taking, including vitamins, herbs, eye drops, creams, and over-the-counter medicines.  Any problems you or family members have had with anesthetic medicines.  Any blood disorders you have.  Any surgeries you have had.  Any medical conditions you have.  Whether you are pregnant or may be pregnant. What are the risks? Generally, this is a safe procedure. However, problems may occur, including:  Allergic reactions to medicines.  A blood clot that breaks  free and travels to other parts of your body.  The possible return of an abnormal heart rhythm within hours or days after the procedure.  Your heart stopping (cardiac arrest). This is rare. What happens before the procedure? Medicines  Your health care provider may have you start taking: ? Blood-thinning medicines (anticoagulants) so your blood does not clot as easily. ? Medicines may be given to help stabilize your heart rate and rhythm.  Ask your health care provider about changing or stopping your regular medicines. This is especially important if you are taking diabetes medicines or blood thinners. General instructions  Plan to have someone take you home from the hospital or clinic.  If you will be going home right after the procedure, plan to have someone with you for 24 hours.  Follow instructions from your health care provider about eating or drinking restrictions. What happens during the procedure?  To lower your risk of infection: ? Your health care team will wash or sanitize their hands. ? Your skin will be washed with soap.  An IV tube will be inserted into one of your veins.  You will be given a medicine to help you relax (sedative).  Sticky patches (electrodes) or metal paddles may be placed on your chest.  An electrical shock will be delivered. The procedure may vary among health care providers and hospitals. What happens after the procedure?   Your blood pressure, heart rate, breathing rate, and blood oxygen level will be monitored until the medicines you were given have worn off.  Do not drive for 24 hours if you were given a sedative.  Your heart rhythm will be watched to make sure it does not change. This information is not intended to replace advice given to you by your health care provider. Make sure you discuss any questions you have with your health care provider. Document Released: 05/19/2002 Document Revised: 05/11/2017 Document Reviewed: 12/03/2015  Elsevier Patient Education  2020 [employer].

## 2018-12-15 LAB — TSH: TSH: 1.27 u[IU]/mL (ref 0.450–4.500)

## 2018-12-15 LAB — BASIC METABOLIC PANEL
BUN/Creatinine Ratio: 12 (ref 10–24)
BUN: 13 mg/dL (ref 8–27)
CO2: 25 mmol/L (ref 20–29)
Calcium: 9.6 mg/dL (ref 8.6–10.2)
Chloride: 100 mmol/L (ref 96–106)
Creatinine, Ser: 1.12 mg/dL (ref 0.76–1.27)
GFR calc Af Amer: 77 mL/min/{1.73_m2} (ref >59)
GFR calc non Af Amer: 67 mL/min/{1.73_m2} (ref >59)
Glucose: 83 mg/dL (ref 65–99)
Potassium: 4.5 mmol/L (ref 3.5–5.2)
Sodium: 134 mmol/L (ref 134–144)

## 2018-12-15 LAB — CBC
Hematocrit: 45.4 % (ref 37.5–51.0)
Hemoglobin: 14.6 g/dL (ref 13.0–17.7)
MCH: 30.9 pg (ref 26.6–33.0)
MCHC: 32.2 g/dL (ref 31.5–35.7)
MCV: 96 fL (ref 79–97)
Platelets: 366 10*3/uL (ref 150–450)
RBC: 4.72 x10E6/uL (ref 4.14–5.80)
RDW: 13 % (ref 11.6–15.4)
WBC: 9.9 10*3/uL (ref 3.4–10.8)

## 2018-12-20 ENCOUNTER — Other Ambulatory Visit (HOSPITAL_COMMUNITY)
Admission: RE | Admit: 2018-12-20 | Discharge: 2018-12-20 | Disposition: A | Discharge status: Home / Self Care | Payer: Medicare Other | Source: Ambulatory Visit | Attending: Cardiology | Admitting: Cardiology

## 2018-12-20 ENCOUNTER — Other Ambulatory Visit: Payer: Self-pay

## 2018-12-20 ENCOUNTER — Ambulatory Visit (HOSPITAL_BASED_OUTPATIENT_CLINIC_OR_DEPARTMENT_OTHER): Payer: Medicare Other

## 2018-12-20 DIAGNOSIS — E785 Hyperlipidemia, unspecified: Secondary | ICD-10-CM | POA: Insufficient documentation

## 2018-12-20 DIAGNOSIS — G4733 Obstructive sleep apnea (adult) (pediatric): Secondary | ICD-10-CM | POA: Insufficient documentation

## 2018-12-20 DIAGNOSIS — I4891 Unspecified atrial fibrillation: Secondary | ICD-10-CM | POA: Insufficient documentation

## 2018-12-20 DIAGNOSIS — I358 Other nonrheumatic aortic valve disorders: Secondary | ICD-10-CM | POA: Insufficient documentation

## 2018-12-20 DIAGNOSIS — R001 Bradycardia, unspecified: Secondary | ICD-10-CM | POA: Diagnosis not present

## 2018-12-20 DIAGNOSIS — I10 Essential (primary) hypertension: Secondary | ICD-10-CM

## 2018-12-20 DIAGNOSIS — Z1159 Encounter for screening for other viral diseases: Secondary | ICD-10-CM | POA: Insufficient documentation

## 2018-12-20 DIAGNOSIS — R931 Abnormal findings on diagnostic imaging of heart and coronary circulation: Secondary | ICD-10-CM

## 2018-12-20 LAB — SARS CORONAVIRUS 2 (TAT 6-24 HRS): SARS Coronavirus 2: NEGATIVE

## 2018-12-20 NOTE — Progress Notes (Signed)
Call placed to patient regarding procedure scheduled for 12/23/2018. Patient had COVID test 12/20/2018  Results pending at this time. States he will quarantine at home this weekend, denies any COVID related symptoms, denies any household members with COVID symptoms.

## 2018-12-23 ENCOUNTER — Ambulatory Visit (HOSPITAL_COMMUNITY): Payer: Medicare Other | Admitting: Anesthesiology

## 2018-12-23 ENCOUNTER — Encounter (HOSPITAL_COMMUNITY): Payer: Self-pay | Admitting: *Deleted

## 2018-12-23 ENCOUNTER — Other Ambulatory Visit: Payer: Self-pay | Admitting: *Deleted

## 2018-12-23 ENCOUNTER — Encounter (HOSPITAL_COMMUNITY)
Admission: RE | Disposition: A | Discharge status: Home / Self Care | Payer: Self-pay | Source: Home / Self Care | Attending: Cardiology

## 2018-12-23 ENCOUNTER — Other Ambulatory Visit: Payer: Self-pay

## 2018-12-23 ENCOUNTER — Ambulatory Visit (HOSPITAL_COMMUNITY)
Admission: RE | Admit: 2018-12-23 | Discharge: 2018-12-23 | Disposition: A | Discharge status: Home / Self Care | Payer: Medicare Other | Attending: Cardiology | Admitting: Cardiology

## 2018-12-23 DIAGNOSIS — F419 Anxiety disorder, unspecified: Secondary | ICD-10-CM | POA: Diagnosis not present

## 2018-12-23 DIAGNOSIS — E785 Hyperlipidemia, unspecified: Secondary | ICD-10-CM | POA: Insufficient documentation

## 2018-12-23 DIAGNOSIS — Z79899 Other long term (current) drug therapy: Secondary | ICD-10-CM | POA: Diagnosis not present

## 2018-12-23 DIAGNOSIS — N400 Enlarged prostate without lower urinary tract symptoms: Secondary | ICD-10-CM | POA: Diagnosis not present

## 2018-12-23 DIAGNOSIS — G4733 Obstructive sleep apnea (adult) (pediatric): Secondary | ICD-10-CM | POA: Insufficient documentation

## 2018-12-23 DIAGNOSIS — I10 Essential (primary) hypertension: Secondary | ICD-10-CM | POA: Diagnosis not present

## 2018-12-23 DIAGNOSIS — Z8249 Family history of ischemic heart disease and other diseases of the circulatory system: Secondary | ICD-10-CM | POA: Insufficient documentation

## 2018-12-23 DIAGNOSIS — Z7901 Long term (current) use of anticoagulants: Secondary | ICD-10-CM | POA: Diagnosis not present

## 2018-12-23 DIAGNOSIS — I4510 Unspecified right bundle-branch block: Secondary | ICD-10-CM | POA: Insufficient documentation

## 2018-12-23 DIAGNOSIS — I4819 Other persistent atrial fibrillation: Secondary | ICD-10-CM | POA: Diagnosis not present

## 2018-12-23 DIAGNOSIS — Z881 Allergy status to other antibiotic agents status: Secondary | ICD-10-CM | POA: Insufficient documentation

## 2018-12-23 DIAGNOSIS — Z882 Allergy status to sulfonamides status: Secondary | ICD-10-CM | POA: Diagnosis not present

## 2018-12-23 DIAGNOSIS — K219 Gastro-esophageal reflux disease without esophagitis: Secondary | ICD-10-CM | POA: Insufficient documentation

## 2018-12-23 DIAGNOSIS — I4891 Unspecified atrial fibrillation: Secondary | ICD-10-CM | POA: Insufficient documentation

## 2018-12-23 DIAGNOSIS — Z87891 Personal history of nicotine dependence: Secondary | ICD-10-CM | POA: Diagnosis not present

## 2018-12-23 DIAGNOSIS — E049 Nontoxic goiter, unspecified: Secondary | ICD-10-CM | POA: Diagnosis not present

## 2018-12-23 HISTORY — PX: CARDIOVERSION: SHX1299

## 2018-12-23 SURGERY — CARDIOVERSION
Anesthesia: General

## 2018-12-23 MED ORDER — SODIUM CHLORIDE 0.9 % IV SOLN
INTRAVENOUS | Status: DC | PRN
  Administered 2018-12-23: 08:00:00 via INTRAVENOUS

## 2018-12-23 MED ORDER — METOPROLOL TARTRATE 25 MG PO TABS: 12.5000 mg | ORAL_TABLET | Freq: Two times a day (BID) | ORAL | 3 refills | Status: DC

## 2018-12-23 MED ORDER — ONDANSETRON HCL 4 MG/2ML IJ SOLN
INTRAMUSCULAR | Status: DC | PRN
  Administered 2018-12-23: 60 mg via INTRAVENOUS

## 2018-12-23 MED ORDER — RIVAROXABAN 20 MG PO TABS: 20.0000 mg | ORAL_TABLET | Freq: Every day | ORAL | 5 refills | Status: DC

## 2018-12-23 MED ORDER — PROPOFOL 10 MG/ML IV BOLUS
INTRAVENOUS | Status: DC | PRN
  Administered 2018-12-23: 70 mg via INTRAVENOUS

## 2018-12-23 MED ORDER — SODIUM CHLORIDE 0.9 % IV SOLN: INTRAVENOUS | Status: DC

## 2018-12-23 NOTE — CV Procedure (Signed)
Procedure:   DCCV  Indication:  Symptomatic atrial fibrillation  Procedure Note:  The patient signed informed consent.  They have had had therapeutic anticoagulation with rivaroxaban greater than 3 weeks.  Anesthesia was administered by Dr. Kerin Perna.  Adequate airway was maintained throughout and vital followed per protocol.  They were cardioverted x 1 with 120J of biphasic synchronized energy.  They converted to sinus bradycardia in the mid 40s.  There were no apparent complications.  The patient had normal neuro status and respiratory status post procedure with vitals stable as recorded elsewhere.    Follow up:  They will continue on current medical therapy, with the exception of decreased metoprolol dose (from 25 mg BID to 12.5 mg BID)  and follow up with cardiology as scheduled.  Buford Dresser, MD PhD 12/23/2018 8:05 AM

## 2018-12-23 NOTE — Anesthesia Postprocedure Evaluation (Signed)
Anesthesia Post Note  Patient: Cory Mathis  Procedure(s) Performed: CARDIOVERSION (N/A )     Patient location during evaluation: PACU Anesthesia Type: General Level of consciousness: awake and alert Pain management: pain level controlled Vital Signs Assessment: post-procedure vital signs reviewed and stable Respiratory status: spontaneous breathing, nonlabored ventilation and respiratory function stable Cardiovascular status: blood pressure returned to baseline and stable Postop Assessment: no apparent nausea or vomiting Anesthetic complications: no    Last Vitals:  Vitals:   12/23/18 0806 12/23/18 0809  BP: (!) 114/49 (!) 107/57  Pulse: (!) 48 (!) 49  Resp: 20 17  Temp: 36.9 C   SpO2: 99% 100%    Last Pain:  Vitals:   12/23/18 0806  TempSrc: Oral  PainSc:                  Lidia Collum

## 2018-12-23 NOTE — Telephone Encounter (Signed)
Pt has been made aware of his echocardiogram results. He is requesting a refill for the Xarelto. Will fwd to coumadin clinic to review.

## 2018-12-23 NOTE — Anesthesia Preprocedure Evaluation (Addendum)
Anesthesia Evaluation  Patient identified by MRN, date of birth, ID band Patient awake    Reviewed: Allergy & Precautions, NPO status , Patient's Chart, lab work & pertinent test results  History of Anesthesia Complications Negative for: history of anesthetic complications  Airway Mallampati: II  TM Distance: <3 FB Neck ROM: full    Dental  (+) Dental Advidsory Given   Pulmonary sleep apnea and Continuous Positive Airway Pressure Ventilation , former smoker,    Pulmonary exam normal        Cardiovascular hypertension, Pt. on medications + CAD   Rhythm:irregular Rate:Normal     Neuro/Psych negative neurological ROS  negative psych ROS   GI/Hepatic Neg liver ROS, PUD, GERD  Medicated and Controlled,  Endo/Other  negative endocrine ROS  Renal/GU negative Renal ROS     Musculoskeletal negative musculoskeletal ROS (+)   Abdominal   Peds  Hematology negative hematology ROS (+)   Anesthesia Other Findings   Reproductive/Obstetrics                           Anesthesia Physical Anesthesia Plan  ASA: III  Anesthesia Plan: General   Post-op Pain Management:    Induction: Intravenous  PONV Risk Score and Plan: 2 and TIVA and Treatment may vary due to age or medical condition  Airway Management Planned: Mask  Additional Equipment: None  Intra-op Plan:   Post-operative Plan:   Informed Consent: I have reviewed the patients History and Physical, chart, labs and discussed the procedure including the risks, benefits and alternatives for the proposed anesthesia with the patient or authorized representative who has indicated his/her understanding and acceptance.       Plan Discussed with:   Anesthesia Plan Comments:        Anesthesia Quick Evaluation

## 2018-12-23 NOTE — Anesthesia Procedure Notes (Signed)
Procedure Name: General with mask airway Date/Time: 12/23/2018 7:55 AM Performed by: Neldon Newport, CRNA Pre-anesthesia Checklist: Timeout performed, Patient being monitored, Suction available, Emergency Drugs available and Patient identified Patient Re-evaluated:Patient Re-evaluated prior to induction Oxygen Delivery Method: Ambu bag Preoxygenation: Pre-oxygenation with 100% oxygen Induction Type: IV induction Ventilation: Mask ventilation without difficulty

## 2018-12-23 NOTE — Progress Notes (Signed)
Pt has been made aware of normal result and verbalized understanding.  jw 12/23/2018

## 2018-12-23 NOTE — Transfer of Care (Signed)
Immediate Anesthesia Transfer of Care Note  Patient: Izaak Sahr  Procedure(s) Performed: CARDIOVERSION (N/A )  Patient Location: Endoscopy Unit  Anesthesia Type:General  Level of Consciousness: awake, alert  and oriented  Airway & Oxygen Therapy: Patient Spontanous Breathing and Patient connected to nasal cannula oxygen  Post-op Assessment: Report given to RN, Post -op Vital signs reviewed and stable and Patient moving all extremities X 4  Post vital signs: Reviewed and stable  Last Vitals:  Vitals Value Taken Time  BP    Temp    Pulse 46 12/23/18 0806  Resp 18 12/23/18 0806  SpO2 100 % 12/23/18 0806  Vitals shown include unvalidated device data.  Last Pain:  Vitals:   12/23/18 0749  TempSrc: Oral  PainSc: 0-No pain         Complications: No apparent anesthesia complications

## 2018-12-23 NOTE — Interval H&P Note (Signed)
History and Physical Interval Note:  12/23/2018 7:44 AM  Cory Mathis  has presented today for surgery, with the diagnosis of A-FIB.  The various methods of treatment have been discussed with the patient and family. After consideration of risks, benefits and other options for treatment, the patient has consented to  Procedure(s): CARDIOVERSION (N/A) as a surgical intervention.  The patient's history has been reviewed, patient examined, no change in status, stable for surgery.  I have reviewed the patient's chart and labs.  Questions were answered to the patient's satisfaction.     Luanna Weesner Harrell Gave

## 2018-12-23 NOTE — Telephone Encounter (Signed)
Xarelto 20mg  refill request received; pt is 68 yrs old, wt-94.3kg, Crea-1.12 on 12/11/2018, last seen by Vin on 12/11/2018, Afib diagnosis,  CrCl-83.26ml/min; will send in refill to requested pharmacy.

## 2018-12-25 ENCOUNTER — Encounter (HOSPITAL_COMMUNITY): Payer: Self-pay | Admitting: Cardiology

## 2018-12-27 ENCOUNTER — Telehealth: Payer: Self-pay | Admitting: Internal Medicine

## 2018-12-27 NOTE — Telephone Encounter (Signed)
Advised patient, to monitor his pressure and HRs.

## 2018-12-27 NOTE — Telephone Encounter (Signed)
Spoke with the patient, he stated after his cardioversion, the hospital decrease lopressor to 12.5 mg, BID. However, he has been having low heart rates around 40s and 50s. He checked his blood pressure and it was 132/67, HR:51. He was wondering if he should continue to take his medication. Spoke with Dr. Curt Bears (DOD), he stated the patient should stop lopressor and reach out to Dr. Harrington Challenger.

## 2018-12-27 NOTE — Telephone Encounter (Signed)
Agree   Keep on on BP and HR   SOmetimes after stopping lopressor, BP with jump up

## 2018-12-27 NOTE — Telephone Encounter (Signed)
New message   Pt c/o medication issue:  1. Name of Medication: metoprolol tartrate (LOPRESSOR) 25 MG tablet  2. How are you currently taking this medication (dosage and times per day)? 1/2 pill twice daily  3. Are you having a reaction (difficulty breathing--STAT)? No   4. What is your medication issue? Patient states that his pulse is in the low 40's and wants to know if he can take this medication?

## 2019-01-10 ENCOUNTER — Telehealth: Payer: Self-pay

## 2019-01-10 NOTE — Telephone Encounter (Signed)

## 2019-01-13 ENCOUNTER — Other Ambulatory Visit: Payer: Self-pay

## 2019-01-13 ENCOUNTER — Encounter: Payer: Self-pay | Admitting: Cardiology

## 2019-01-13 ENCOUNTER — Ambulatory Visit (INDEPENDENT_AMBULATORY_CARE_PROVIDER_SITE_OTHER): Payer: Medicare Other | Admitting: Cardiology

## 2019-01-13 VITALS — BP 132/75 | HR 59 | Ht 71.0 in | Wt 203.8 lb

## 2019-01-13 DIAGNOSIS — R001 Bradycardia, unspecified: Secondary | ICD-10-CM | POA: Diagnosis not present

## 2019-01-13 NOTE — Patient Instructions (Signed)
Medication Instructions:  none If you need a refill on your cardiac medications before your next appointment, please call your pharmacy.   Lab work: none If you have labs (blood work) drawn today and your tests are completely normal, you will receive your results only by: . MyChart Message (if you have MyChart) OR . A paper copy in the mail If you have any lab test that is abnormal or we need to change your treatment, we will call you to review the results.  Testing/Procedures: none  Follow-Up: At [facility], you and your health needs are our priority.  As part of our continuing mission to provide you with exceptional heart care, we have created designated Provider Care Teams.  These Care Teams include your primary Cardiologist (physician) and Advanced Practice Providers (APPs -  Physician Assistants and Nurse Practitioners) who all work together to provide you with the care you need, when you need it. .   Any Other Special Instructions Will Be Listed Below (If Applicable).    

## 2019-01-13 NOTE — Progress Notes (Signed)
01/13/2019 Cory Mathis   1951/01/06  [id number]  Primary Physician [name], MD Primary Cardiologist: Dorris Carnes, MD  Electrophysiologist: None   Reason for Visit/CC: f/u for atrial fibrillation s/p DCCV  HPI:  [name] is a 68 y.o. male with HTN, HLD, persistent atrial fibrillation s/p recent DCCV and bradycardia.  CHA2DS2 VASc score is 2 for HTN and Age 90-74. He is on Xarelto for a/c. Echo 12/20/18 showed normal LVEF and normal sized R&L atria. No significant valvular disease.  He presents to clinic today for post procedural f/u after outpatient DCCV on 12/23/18. Per procedural note, cardioversion was successful. He was cardioverted x 1 with 120J of biphasic synchronized energy.  He converted to sinus bradycardia in the mid 40s.  There were no apparent complications. Given bradycardia, his metoprolol dose was reduced down from 25 mg BID to 12.5 mg BID. Post discharge, he continued to have bradycardia, w/ HR in the 40s and discontinued metoprolol entirely.    He reports back today for f/u. EKG shows sinus bradycardia, 53 bpm. BP controlled.  He denies any symptoms of breakthrough atrial fibrillation.  He reports full compliance with Xarelto without any interruption.  No abnormal bleeding.  No falls.  He is very concerned about his resting heart rate that has been as low as the 40s.  However he has been completely asymptomatic without dizziness, syncope/near syncope, fatigue or dyspnea.  He is also very active and exercises several days a week, doing cardio on the elliptical and walking.  He reports that he is able to get his heart rate up with exercise above 100.  Heart rate usually around 110 with exercise.  No chest pain.  He wore a cardiac monitor last year that showed lowest heart rate to be 36 and highest heart rate to be 90 bpm.  His average heart rate was 51 bpm.  There were no significant pauses and no AV blocks.   Recent labs reviewed.  TSH was within normal limits.  CBC and  metabolic panel also normal.  He has obstructive sleep apnea reports full compliance with CPAP nightly.  Cardiac Studies  2D Echo 12/20/18 IMPRESSIONS    1. The left ventricle has normal systolic function with an ejection fraction of 60-65%. The cavity size was normal. Left ventricular diastolic function could not be evaluated secondary to atrial fibrillation.  2. The right ventricle has normal systolic function. The cavity was normal.  3. The mitral valve is grossly normal.  4. The aortic valve is tricuspid. Mild calcification of the aortic valve. Aortic valve regurgitation is trivial by color flow Doppler. No stenosis of the aortic valve.  5. Normal LV function; trace AI and MR.  Current Meds  Medication Sig  . amLODipine (NORVASC) 5 MG tablet Take 1 tablet (5 mg total) by mouth 2 (two) times daily.  . Carboxymethylcellulose Sodium (ARTIFICIAL TEARS OP) Place 1 drop into both eyes daily as needed (dry eyes).  . famotidine (PEPCID) 20 MG tablet Take 20 mg by mouth at bedtime as needed for heartburn or indigestion.  Marland Kitchen oxymetazoline (AFRIN) 0.05 % nasal spray Place 2 sprays into both nostrils daily as needed for congestion.  . Probiotic Product (PROBIOTIC PO) Take 1 tablet by mouth daily.  . rivaroxaban (XARELTO) 20 MG TABS tablet Take 1 tablet (20 mg total) by mouth daily with supper.  . zolpidem (AMBIEN CR) 12.5 MG CR tablet Take 12.5 mg by mouth at bedtime.    Allergies  Allergen Reactions  .  Sulfa Antibiotics Hives and Rash  . Sulfasalazine Hives and Other (See Comments)  . Metronidazole Other (See Comments)    Pt reported he was light headed for 2 weeks.   . Other     Dark chocolate and mints - makes him sneeze   Past Medical History:  Diagnosis Date  . BPH (benign prostatic hyperplasia)   . Diverticulosis of colon   . GERD (gastroesophageal reflux disease)    prn med. control  . History of adenomatous polyp of colon    03-29-2007  tubular adenoma/  08-07-2016   hyperplastic and tubular adenoma's  . History of alcoholic gastritis    [id number]  gastritis, esophagitis, peptic duodenitis  . History of colonic diverticulitis    01-31-2016  resolved w/ medications  . History of Helicobacter pylori infection    10-30-2008  . 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 Internal hemorrhoids   . Memory loss   . OSA on CPAP    CPAP nightly  . Right thyroid nodule   . Urinary retention 12/13/2016   Family History  Problem Relation Age of Onset  . Heart attack Mother        73's  . Other Father        atherosclerosis - 32's  . Prostate cancer Maternal Grandfather   . Other Sister        brain tumor - unsure if it was cancer - 20's  . Colon cancer Neg Hx   . Esophageal cancer Neg Hx   . Pancreatic cancer Neg Hx   . Rectal cancer Neg Hx   . Stomach cancer Neg Hx    Past Surgical History:  Procedure Laterality Date  . CARDIOVERSION N/A 12/23/2018   Procedure: CARDIOVERSION;  Surgeon: Buford Dresser, MD;  Location: [facility] ENDOSCOPY;  Service: Cardiovascular;  Laterality: N/A;  . COLONOSCOPY  last one 08-07-2016  . HEMORRHOID BANDING  10-20-2016;  09-11-2016  . HERNIA REPAIR    . KNEE ARTHROSCOPY Bilateral    10 + yrs ago  . LASIK Bilateral   . PILONIDAL CYST EXCISION    . THYROIDECTOMY Left 2013  . TRANSURETHRAL RESECTION OF PROSTATE N/A 01/04/2017   Procedure: TRANSURETHRAL RESECTION OF THE PROSTATE (TURP);  Surgeon: Franchot Gallo, MD;  Location: [facility];  Service: Urology;  Laterality: N/A;  . UPPER GASTROINTESTINAL ENDOSCOPY  10/30/2008  . WISDOM TOOTH EXTRACTION     Social History   Socioeconomic History  . Marital status: Married    Spouse name: Not on file  . Number of children: 0  . Years of education: 16+  . Highest education level: Bachelor's degree (e.g., BA, AB, BS)  Occupational History  . Occupation: Retired  Scientific laboratory technician  . Financial resource  strain: Not on file  . Food insecurity    Worry: Not on file    Inability: Not on file  . Transportation needs    Medical: Not on file    Non-medical: Not on file  Tobacco Use  . Smoking status: Former Research scientist (life sciences)  . Smokeless tobacco: Never Used  Substance and Sexual Activity  . Alcohol use: Yes    Comment: social   . Drug use: No  . Sexual activity: Not on file  Lifestyle  . Physical activity    Days per week: Not on file    Minutes per session: Not on file  . Stress: Not on file  Relationships  . Social  connections    Talks on phone: Not on file    Gets together: Not on file    Attends religious service: Not on file    Active member of club or organization: Not on file    Attends meetings of clubs or organizations: Not on file    Relationship status: Not on file  . Intimate partner violence    Fear of current or ex partner: Not on file    Emotionally abused: Not on file    Physically abused: Not on file    Forced sexual activity: Not on file  Other Topics Concern  . Not on file  Social History Narrative   Left-handed.   1.5 cups caffeine per day.     Lipid Panel  No results found for: CHOL, TRIG, HDL, CHOLHDL, VLDL, LDLCALC, LDLDIRECT  Review of Systems: General: negative for chills, fever, night sweats or weight changes.  Cardiovascular: negative for chest pain, dyspnea on exertion, edema, orthopnea, palpitations, paroxysmal nocturnal dyspnea or shortness of breath Dermatological: negative for rash Respiratory: negative for cough or wheezing Urologic: negative for hematuria Abdominal: negative for nausea, vomiting, diarrhea, bright red blood per rectum, melena, or hematemesis Neurologic: negative for visual changes, syncope, or dizziness All other systems reviewed and are otherwise negative except as noted above.   Physical Exam:  Blood pressure 132/75, pulse (!) 59, height 5\' 11"  (1.803 m), weight 203 lb 12.8 oz (92.4 kg), SpO2 97 %.  General appearance: alert,  cooperative and no distress Neck: no carotid bruit and no JVD Lungs: clear to auscultation bilaterally Heart: Regular rhythm, slow rate, no murmurs rubs or gallops Extremities: extremities normal, atraumatic, no cyanosis or edema Pulses: 2+ and symmetric Skin: Skin color, texture, turgor normal. No rashes or lesions Neurologic: Grossly normal  EKG sinus bradycardia, 53 bpm.  Right bundle branch block-- personally reviewed   ASSESSMENT AND PLAN:   1. Persistent Atrial Fibrillation: s/p DCCV 12/23/18. EKG today shows sinus bradycardia 53 bpm. He denies any symptoms of recurrent afib.  blocker discontinued due to bradycardia. CHA2DS2 VASc score is 2 for HTN and Age 62-74. Continue Xarelto for a/c.   2.  Asymptomatic sinus bradycardia: Pt converted to sinus bradycardia, w/ HR in the 40s, following cardioversion. Metoprolol dose reduced down from 25 mg BID to 12.5 mg BID, but eventually discontinued entirely due to persistent bradycardia with average heart rates at home ranging from the 40s to 50s.  He has been completely asymptomatic with no dizziness, lightheadedness, syncope/near syncope, fatigue or dyspnea.  Patient is very concerned given his resting heart rate, however we discussed that given lack of symptoms and appropriate response to exercise with increase in heart rate well above 100 with activity, that there is no indication for permanent pacemaker at this time.  Patient was educated on the symptoms of concern and instructed to notify our office if the symptoms were to later develop.  He verbalized understanding.  Continue to avoid AV nodal blocking agents.  If he has recurrent atrial fibrillation with rapid ventricular response, limiting the use of AV nodal blocking agents, he may later require permanent pacemaker for sinus node dysfunction.  3. HTN: Controlled on current regimen.  Continue amlodipine.  4. OSA: reports full compliance w/ CPAP.    Follow-Up: keep f/u with Dr. Harrington Challenger in  September.   Fannie Gathright Ladoris Gene, MHS [facility] 01/13/2019 11:52 AM

## 2019-02-03 ENCOUNTER — Telehealth: Payer: Self-pay | Admitting: Internal Medicine

## 2019-02-03 NOTE — Telephone Encounter (Signed)
OK to take flu shot

## 2019-02-03 NOTE — Telephone Encounter (Signed)
Patient called wanting to make sure it's ok for him to get a flu shot.

## 2019-02-05 DIAGNOSIS — Z23 Encounter for immunization: Secondary | ICD-10-CM | POA: Diagnosis not present

## 2019-02-13 ENCOUNTER — Other Ambulatory Visit: Payer: Medicare Other | Admitting: *Deleted

## 2019-02-13 ENCOUNTER — Other Ambulatory Visit: Payer: Self-pay

## 2019-02-13 DIAGNOSIS — E785 Hyperlipidemia, unspecified: Secondary | ICD-10-CM | POA: Diagnosis not present

## 2019-02-13 DIAGNOSIS — R931 Abnormal findings on diagnostic imaging of heart and coronary circulation: Secondary | ICD-10-CM

## 2019-02-14 LAB — NMR, LIPOPROFILE
Cholesterol, Total: 140 mg/dL (ref 100–199)
HDL Particle Number: 24.3 umol/L — ABNORMAL LOW (ref >=30.5)
HDL-C: 38 mg/dL — ABNORMAL LOW (ref >39)
LDL Particle Number: 832 nmol/L (ref <1000)
LDL Size: 20.8 nm (ref >20.5)
LDL-C (NIH Calc): 86 mg/dL (ref 0–99)
LP-IR Score: 35 (ref <=45)
Small LDL Particle Number: 220 nmol/L (ref <=527)
Triglycerides: 80 mg/dL (ref 0–149)

## 2019-02-14 LAB — LIPOPROTEIN A (LPA): Lipoprotein (a): 10.7 nmol/L (ref <75.0)

## 2019-02-14 LAB — APOLIPOPROTEIN B: Apolipoprotein B: 78 mg/dL (ref <90)

## 2019-02-18 ENCOUNTER — Telehealth: Payer: Self-pay

## 2019-02-18 NOTE — Telephone Encounter (Signed)
Virtual Visit Pre-Appointment Phone Call  "(Name), I am calling you today to discuss your upcoming appointment. We are currently trying to limit exposure to the virus that causes COVID-19 by seeing patients at home rather than in the office."  1. "What is the BEST phone number to call the day of the visit?" - include this in appointment notes [phone]  2. Do you have or have access to (through a family member/friend) a smartphone with video capability that we can use for your visit?" a. If yes - list this number in appt notes as cell (if different from BEST phone #) and list the appointment type as a VIDEO visit in appointment notes b. If no - list the appointment type as a PHONE visit in appointment notes  3. Confirm consent - "In the setting of the current Covid19 crisis, you are scheduled for a (phone or video) visit with your provider on (date) at (time).  Just as we do with many in-office visits, in order for you to participate in this visit, we must obtain consent.  If you'd like, I can send this to your mychart (if signed up) or email for you to review.  Otherwise, I can obtain your verbal consent now.  All virtual visits are billed to your insurance company just like a normal visit would be.  By agreeing to a virtual visit, we'd like you to understand that the technology does not allow for your provider to perform an examination, and thus may limit your provider's ability to fully assess your condition. If your provider identifies any concerns that need to be evaluated in person, we will make arrangements to do so.  Finally, though the technology is pretty good, we cannot assure that it will always work on either your or our end, and in the setting of a video visit, we may have to convert it to a phone-only visit.  In either situation, we cannot ensure that we have a secure connection.  Are you willing to proceed?" STAFF: Did the patient verbally acknowledge consent to telehealth visit?  Document YES/NO here: YES  4. Advise patient to be prepared - "Two hours prior to your appointment, go ahead and check your blood pressure, pulse, oxygen saturation, and your weight (if you have the equipment to check those) and write them all down. When your visit starts, your provider will ask you for this information. If you have an Apple Watch or Kardia device, please plan to have heart rate information ready on the day of your appointment. Please have a pen and paper handy nearby the day of the visit as well."  5. Give patient instructions for MyChart download to smartphone OR [email or web address] as below if video visit (depending on what platform provider is using)  6. Inform patient they will receive a phone call 15 minutes prior to their appointment time (may be from unknown caller ID) so they should be prepared to answer    TELEPHONE CALL NOTE  Cory Mathis has been deemed a candidate for a follow-up tele-health visit to limit community exposure during the Covid-19 pandemic. I spoke with the patient via phone to ensure availability of phone/video source, confirm preferred email & phone number, and discuss instructions and expectations.  I reminded Cory Mathis to be prepared with any vital sign and/or heart rhythm information that could potentially be obtained via home monitoring, at the time of his visit. I reminded Cory Mathis to expect a phone call prior to his visit.  Wilma Flavin, RN 02/18/2019 11:37 AM    FULL LENGTH CONSENT FOR TELE-HEALTH VISIT   I hereby voluntarily request, consent and authorize [facility] and its employed or contracted physicians, physician assistants, nurse practitioners or other licensed health care professionals (the Practitioner), to provide me with telemedicine health care services (the Services") as deemed necessary by the treating Practitioner. I acknowledge and consent to receive the Services by the Practitioner via telemedicine. I understand  that the telemedicine visit will involve communicating with the Practitioner through live audiovisual communication technology and the disclosure of certain medical information by electronic transmission. I acknowledge that I have been given the opportunity to request an in-person assessment or other available alternative prior to the telemedicine visit and am voluntarily participating in the telemedicine visit.  I understand that I have the right to withhold or withdraw my consent to the use of telemedicine in the course of my care at any time, without affecting my right to future care or treatment, and that the Practitioner or I may terminate the telemedicine visit at any time. I understand that I have the right to inspect all information obtained and/or recorded in the course of the telemedicine visit and may receive copies of available information for a reasonable fee.  I understand that some of the potential risks of receiving the Services via telemedicine include:   Delay or interruption in medical evaluation due to technological equipment failure or disruption;  Information transmitted may not be sufficient (e.g. poor resolution of images) to allow for appropriate medical decision making by the Practitioner; and/or   In rare instances, security protocols could fail, causing a breach of personal health information.  Furthermore, I acknowledge that it is my responsibility to provide information about my medical history, conditions and care that is complete and accurate to the best of my ability. I acknowledge that Practitioner's advice, recommendations, and/or decision may be based on factors not within their control, such as incomplete or inaccurate data provided by me or distortions of diagnostic images or specimens that may result from electronic transmissions. I understand that the practice of medicine is not an exact science and that Practitioner makes no warranties or guarantees regarding  treatment outcomes. I acknowledge that I will receive a copy of this consent concurrently upon execution via email to the email address I last provided but may also request a printed copy by calling the office of [place].    I understand that my insurance will be billed for this visit.   I have read or had this consent read to me.  I understand the contents of this consent, which adequately explains the benefits and risks of the Services being provided via telemedicine.   I have been provided ample opportunity to ask questions regarding this consent and the Services and have had my questions answered to my satisfaction.  I give my informed consent for the services to be provided through the use of telemedicine in my medical care  By participating in this telemedicine visit I agree to the above.

## 2019-02-20 ENCOUNTER — Telehealth (INDEPENDENT_AMBULATORY_CARE_PROVIDER_SITE_OTHER): Payer: Medicare Other | Admitting: Internal Medicine

## 2019-02-20 ENCOUNTER — Other Ambulatory Visit: Payer: Self-pay

## 2019-02-20 VITALS — BP 135/72 | HR 61 | Wt 200.0 lb

## 2019-02-20 DIAGNOSIS — E785 Hyperlipidemia, unspecified: Secondary | ICD-10-CM

## 2019-02-20 MED ORDER — ROSUVASTATIN CALCIUM 5 MG PO TABS: 2.5000 mg | ORAL_TABLET | Freq: Every day | ORAL | 3 refills | Status: DC

## 2019-02-20 NOTE — Progress Notes (Signed)
Virtual Visit via Telephone Note   This visit type was conducted due to national recommendations for restrictions regarding the COVID-19 Pandemic (e.g. social distancing) in an effort to limit this patient's exposure and mitigate transmission in our community.  Due to his co-morbid illnesses, this patient is at least at moderate risk for complications without adequate follow up.  This format is felt to be most appropriate for this patient at this time.  The patient did not have access to video technology/had technical difficulties with video requiring transitioning to audio format only (telephone).  All issues noted in this document were discussed and addressed.  No physical exam could be performed with this format.  Please refer to the patient's chart for his  consent to telehealth for [facility].   Date:  02/20/2019   ID:  Cory Mathis, DOB 06/04/1951, MRN [id number]  Patient Location: Home Provider Location: Home  PCP:  [name], MD  Cardiologist:  Dorris Carnes, MD Electrophysiologist:  None   Evaluation Performed:  Follow-Up Visit  Chief Complaint: F?U of PAF and HTN and   History of Present Illness:    Cory Mathis is a 68 y.o. male with Pt is a 68 yo with hx of HTN, HL, CAD by CT (Ca score of 189)  The pt was seen in ED  In June with atrial firllation   Started on Xarelto and Cardiazem   This was switched to Xarelto and metoprolol  The pt was cardioverted on 7/13.    He was bradycardic after and metoprolol was stopped   He was last seen in clini in Aug 2020 Lipids done a few days ago LDL was 86 and particle number improved  Pt says he takes BP 3x per day   130s   Then 120s   OVer 60 to 70s    Pt is active  On elliptical 3x per week     The patient does not have symptoms concerning for COVID-19 infection (fever, chills, cough, or new shortness of breath).    Past Medical History:  Diagnosis Date  . BPH (benign prostatic hyperplasia)   . Diverticulosis of colon   .  GERD (gastroesophageal reflux disease)    prn med. control  . History of adenomatous polyp of colon    03-29-2007  tubular adenoma/  08-07-2016  hyperplastic and tubular adenoma's  . History of alcoholic gastritis    [id number]  gastritis, esophagitis, peptic duodenitis  . History of colonic diverticulitis    01-31-2016  resolved w/ medications  . History of Helicobacter pylori infection    10-30-2008  . 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 Internal hemorrhoids   . Memory loss   . OSA on CPAP    CPAP nightly  . Right thyroid nodule   . Urinary retention 12/13/2016   Past Surgical History:  Procedure Laterality Date  . CARDIOVERSION N/A 12/23/2018   Procedure: CARDIOVERSION;  Surgeon: Buford Dresser, MD;  Location: [facility] ENDOSCOPY;  Service: Cardiovascular;  Laterality: N/A;  . COLONOSCOPY  last one 08-07-2016  . HEMORRHOID BANDING  10-20-2016;  09-11-2016  . HERNIA REPAIR    . KNEE ARTHROSCOPY Bilateral    10 + yrs ago  . LASIK Bilateral   . PILONIDAL CYST EXCISION    . THYROIDECTOMY Left 2013  . TRANSURETHRAL RESECTION OF PROSTATE N/A 01/04/2017   Procedure: TRANSURETHRAL RESECTION OF THE PROSTATE (TURP);  Surgeon: Franchot Gallo, MD;  Location:  [place];  Service: Urology;  Laterality: N/A;  . UPPER GASTROINTESTINAL ENDOSCOPY  10/30/2008  . WISDOM TOOTH EXTRACTION       No outpatient medications have been marked as taking for the 02/20/19 encounter (Telemedicine) with Fay Records, MD.     Allergies:   Sulfa antibiotics, Sulfasalazine, Metronidazole, and Other   Social History   Tobacco Use  . Smoking status: Former Research scientist (life sciences)  . Smokeless tobacco: Never Used  Substance Use Topics  . Alcohol use: Yes    Comment: social   . Drug use: No     Family Hx: The patient's family history includes Heart attack in his mother; Other in his father and sister; Prostate cancer in his maternal  grandfather. There is no history of Colon cancer, Esophageal cancer, Pancreatic cancer, Rectal cancer, or Stomach cancer.  ROS:   Please see the history of present illness.     All other systems reviewed and are negative.   Prior CV studies:   The following studies were reviewed today:   Labs/Other Tests and Data Reviewed:    EKG:  No ECG reviewed.  Recent Labs: 11/29/2018: ALT 15 12/11/2018: BUN 13; Creatinine, Ser 1.12; Hemoglobin 14.6; Platelets 366; Potassium 4.5; Sodium 134; TSH 1.270   Recent Lipid Panel No results found for: CHOL, TRIG, HDL, CHOLHDL, LDLCALC, LDLDIRECT  Wt Readings from Last 3 Encounters:  02/20/19 200 lb (90.7 kg)  01/13/19 203 lb 12.8 oz (92.4 kg)  12/11/18 207 lb 12.8 oz (94.3 kg)     Objective:    Vital Signs:  BP 135/72   Pulse 61 Comment: reports HR is 47-52 while sitting  Wt 200 lb (90.7 kg)   BMI 27.89 kg/m    VITAL SIGNS:  reviewed  ASSESSMENT & PLAN:    1. PAF  Pt s/p cardioversion   Echo normal   Thyroid normal   On CPAP   Tolerating     Would keep on Xarelto and follow  He denies palpitations    2   HTN  BP 110s to 130s at home  Keep following   Keep on current regimne    3  CAD   Ca score 186     No symtpoms of angina   Active   Start Crestor  4   HL   WIll start statin Crestor 2.5 mg   F/U lipids and AST in 8 wks    5  COVID-19 Education: The signs and symptoms of COVID-19 were discussed with the patient and how to seek care for testing (follow up with PCP or arrange E-visit).  The importance of social distancing was discussed today.  Time:   Today, I have spent 25 minutes with the patient with telehealth technology discussing the above problems.     Medication Adjustments/Labs and Tests Ordered: Current medicines are reviewed at length with the patient today.  Concerns regarding medicines are outlined above.   Tests Ordered: No orders of the defined types were placed in this encounter.   Medication Changes: No orders  of the defined types were placed in this encounter.   Follow Up:  In Person Jan 2021  Will be in touch with lab results  Signed, Dorris Carnes, MD  02/20/2019 9:11 AM    [place]

## 2019-02-20 NOTE — Patient Instructions (Signed)
Medication Instructions:  Your physician has recommended you make the following change in your medication:  1.) start rosuvastatin (Crestor) 5 mg tablet --take HALF tablet (2.5 mg) ONCE A DAY  If you need a refill on your cardiac medications before your next appointment, please call your pharmacy.   Lab work: In about 2 months--lipids, ast  Testing/Procedures: none  Follow-Up: At [facility], you and your health needs are our priority.  As part of our continuing mission to provide you with exceptional heart care, we have created designated Provider Care Teams.  These Care Teams include your primary Cardiologist (physician) and Advanced Practice Providers (APPs -  Physician Assistants and Nurse Practitioners) who all work together to provide you with the care you need, when you need it. You will need a follow up appointment in:4- 5 months. Please call our office 2 months in advance to schedule this appointment.  You may see Dorris Carnes, MD or one of the following Advanced Practice Providers on your designated Care Team: Richardson Dopp, PA-C [place], [place] . Daune Perch, NP  Any Other Special Instructions Will Be Listed Below (If Applicable).

## 2019-03-05 DIAGNOSIS — N401 Enlarged prostate with lower urinary tract symptoms: Secondary | ICD-10-CM | POA: Diagnosis not present

## 2019-03-05 DIAGNOSIS — R350 Frequency of micturition: Secondary | ICD-10-CM | POA: Diagnosis not present

## 2019-03-05 DIAGNOSIS — N5201 Erectile dysfunction due to arterial insufficiency: Secondary | ICD-10-CM | POA: Diagnosis not present

## 2019-03-11 ENCOUNTER — Telehealth: Payer: Self-pay | Admitting: Internal Medicine

## 2019-03-11 NOTE — Telephone Encounter (Signed)
New Message:  Patient is on blood thinners. He was told if he hit his head to et the office know. He wanted to know to what extreme that means, and what symptoms to look for. He states he bent down and hit his head on a flat side of the safe that he has in his house. He said it hurt a little, but he was not dizzy or lightheaded.  He is just being cautious and would like to know what to do. Please advise

## 2019-03-11 NOTE — Telephone Encounter (Signed)
Spoke with pt and pt bent over and bumped head Per pt feels fine no pain,dizziness ,cut, or discoloration.Discussed with PharmD and pt should continue to monitor and if develops symptoms (h/a or dizziness ) etc Pt should seek medical care.Pt aware of recommendations and agrees with plan and will resume to normal activity tom .Will forward to [name] for review./cy

## 2019-03-29 DIAGNOSIS — R195 Other fecal abnormalities: Secondary | ICD-10-CM

## 2019-03-29 DIAGNOSIS — Z7901 Long term (current) use of anticoagulants: Secondary | ICD-10-CM

## 2019-03-31 NOTE — Telephone Encounter (Signed)
Set patient up for hemoccult.   His CBC was normal in July 2020

## 2019-04-01 NOTE — Telephone Encounter (Signed)
Order placed for hemocult.  Order released.  Called patient.  He has not noted any active bleeding.  Adv of recommendation from Dr. Harrington Challenger.  He will come into [facility] (downstairs) today and pick up stool card.

## 2019-04-02 ENCOUNTER — Other Ambulatory Visit: Payer: Self-pay | Admitting: Internal Medicine

## 2019-04-03 LAB — FECAL OCCULT BLOOD, IMMUNOCHEMICAL: Fecal Occult Bld: NEGATIVE

## 2019-04-18 ENCOUNTER — Other Ambulatory Visit: Payer: Medicare Other | Admitting: *Deleted

## 2019-04-18 ENCOUNTER — Other Ambulatory Visit: Payer: Self-pay

## 2019-04-18 DIAGNOSIS — D225 Melanocytic nevi of trunk: Secondary | ICD-10-CM | POA: Diagnosis not present

## 2019-04-18 DIAGNOSIS — L821 Other seborrheic keratosis: Secondary | ICD-10-CM | POA: Diagnosis not present

## 2019-04-18 DIAGNOSIS — D2371 Other benign neoplasm of skin of right lower limb, including hip: Secondary | ICD-10-CM | POA: Diagnosis not present

## 2019-04-18 DIAGNOSIS — D485 Neoplasm of uncertain behavior of skin: Secondary | ICD-10-CM | POA: Diagnosis not present

## 2019-04-18 DIAGNOSIS — D2272 Melanocytic nevi of left lower limb, including hip: Secondary | ICD-10-CM | POA: Diagnosis not present

## 2019-04-18 DIAGNOSIS — E785 Hyperlipidemia, unspecified: Secondary | ICD-10-CM | POA: Diagnosis not present

## 2019-04-18 DIAGNOSIS — D2261 Melanocytic nevi of right upper limb, including shoulder: Secondary | ICD-10-CM | POA: Diagnosis not present

## 2019-04-18 DIAGNOSIS — D1801 Hemangioma of skin and subcutaneous tissue: Secondary | ICD-10-CM | POA: Diagnosis not present

## 2019-04-18 DIAGNOSIS — L739 Follicular disorder, unspecified: Secondary | ICD-10-CM | POA: Diagnosis not present

## 2019-04-18 DIAGNOSIS — D2271 Melanocytic nevi of right lower limb, including hip: Secondary | ICD-10-CM | POA: Diagnosis not present

## 2019-04-18 DIAGNOSIS — D2262 Melanocytic nevi of left upper limb, including shoulder: Secondary | ICD-10-CM | POA: Diagnosis not present

## 2019-04-18 LAB — LIPID PANEL
Chol/HDL Ratio: 2.8 ratio (ref 0.0–5.0)
Cholesterol, Total: 111 mg/dL (ref 100–199)
HDL: 40 mg/dL (ref >39)
LDL Chol Calc (NIH): 56 mg/dL (ref 0–99)
Triglycerides: 74 mg/dL (ref 0–149)
VLDL Cholesterol Cal: 15 mg/dL (ref 5–40)

## 2019-04-18 LAB — AST: AST: 15 IU/L (ref 0–40)

## 2019-05-05 ENCOUNTER — Other Ambulatory Visit: Payer: Self-pay | Admitting: Internal Medicine

## 2019-05-05 NOTE — Telephone Encounter (Signed)
Prescription refill request for Xarelto received.   Last office visit: 02/20/2019, Harrington Challenger, telemedicine Weight: 92.4 kg Age: 68 y.o. Scr: 1.12, 12/11/2018 CrCl: 81 ml/min  Prescription refill sent.

## 2019-05-15 DIAGNOSIS — H04123 Dry eye syndrome of bilateral lacrimal glands: Secondary | ICD-10-CM | POA: Diagnosis not present

## 2019-05-15 DIAGNOSIS — G43819 Other migraine, intractable, without status migrainosus: Secondary | ICD-10-CM | POA: Diagnosis not present

## 2019-05-15 DIAGNOSIS — D3131 Benign neoplasm of right choroid: Secondary | ICD-10-CM | POA: Diagnosis not present

## 2019-07-03 ENCOUNTER — Ambulatory Visit: Payer: Medicare Other | Attending: Internal Medicine

## 2019-07-03 DIAGNOSIS — Z23 Encounter for immunization: Secondary | ICD-10-CM | POA: Insufficient documentation

## 2019-07-03 NOTE — Progress Notes (Signed)
   Covid-19 Vaccination Clinic  Name:  Zayed Bigman    MRN: [id number] DOB: 06-22-1950  07/03/2019  Mr. Hagood was observed post Covid-19 immunization for 15 minutes without incidence. He was provided with Vaccine Information Sheet and instruction to access the V-Safe system.   Mr. Otts was instructed to call 911 with any severe reactions post vaccine: Marland Kitchen Difficulty breathing  . Swelling of your face and throat  . A fast heartbeat  . A bad rash all over your body  . Dizziness and weakness    Immunizations Administered    Name Date Dose VIS Date Route   Pfizer COVID-19 Vaccine 07/03/2019 10:46 AM 0.3 mL 05/23/2019 Intramuscular   Manufacturer: [place]   Lot: [id number]   [place]: [id number]

## 2019-07-04 ENCOUNTER — Other Ambulatory Visit: Payer: Self-pay | Admitting: Internal Medicine

## 2019-07-04 ENCOUNTER — Telehealth: Payer: Self-pay | Admitting: Internal Medicine

## 2019-07-04 NOTE — Telephone Encounter (Signed)
We are recommending the COVID-19 vaccine to all of our patients. Cardiac medications (including blood thinners) should not deter anyone from being vaccinated and there is no need to hold any of those medications prior to vaccine administration.     Currently, there is a hotline to call (active 06/20/19) to schedule vaccination appointments as no walk-ins will be accepted.   Number: [phone].    If an appointment is not available please go to [email or web address] to sign up for notification when additional vaccine appointments are available.   If you have further questions or concerns about the vaccine process, please visit [email or web address] or contact your primary care physician.   I have informed patient of instructions.   

## 2019-07-07 ENCOUNTER — Ambulatory Visit: Payer: Medicare Other

## 2019-07-24 ENCOUNTER — Ambulatory Visit: Payer: Medicare Other | Attending: Internal Medicine

## 2019-07-24 DIAGNOSIS — Z23 Encounter for immunization: Secondary | ICD-10-CM

## 2019-07-24 NOTE — Progress Notes (Signed)
   Covid-19 Vaccination Clinic  Name:  Cory Mathis    MRN: [id number] DOB: May 19, 1951  07/24/2019  Cory Mathis was observed post Covid-19 immunization for 15 minutes without incidence. He was provided with Vaccine Information Sheet and instruction to access the V-Safe system.   Cory Mathis was instructed to call 911 with any severe reactions post vaccine: Marland Kitchen Difficulty breathing  . Swelling of your face and throat  . A fast heartbeat  . A bad rash all over your body  . Dizziness and weakness    Immunizations Administered    Name Date Dose VIS Date Route   Pfizer COVID-19 Vaccine 07/24/2019 10:09 AM 0.3 mL 05/23/2019 Intramuscular   Manufacturer: [place]   Lot: [id number]   [place]: [id number]

## 2019-08-13 DIAGNOSIS — H4311 Vitreous hemorrhage, right eye: Secondary | ICD-10-CM | POA: Diagnosis not present

## 2019-08-13 DIAGNOSIS — D3131 Benign neoplasm of right choroid: Secondary | ICD-10-CM | POA: Diagnosis not present

## 2019-08-13 DIAGNOSIS — H35433 Paving stone degeneration of retina, bilateral: Secondary | ICD-10-CM | POA: Diagnosis not present

## 2019-08-13 DIAGNOSIS — H43813 Vitreous degeneration, bilateral: Secondary | ICD-10-CM | POA: Diagnosis not present

## 2019-08-14 DIAGNOSIS — H04123 Dry eye syndrome of bilateral lacrimal glands: Secondary | ICD-10-CM | POA: Diagnosis not present

## 2019-08-14 DIAGNOSIS — H25813 Combined forms of age-related cataract, bilateral: Secondary | ICD-10-CM | POA: Diagnosis not present

## 2019-08-14 DIAGNOSIS — H2513 Age-related nuclear cataract, bilateral: Secondary | ICD-10-CM | POA: Diagnosis not present

## 2019-08-14 DIAGNOSIS — H2512 Age-related nuclear cataract, left eye: Secondary | ICD-10-CM | POA: Diagnosis not present

## 2019-08-14 DIAGNOSIS — D3131 Benign neoplasm of right choroid: Secondary | ICD-10-CM | POA: Diagnosis not present

## 2019-08-14 DIAGNOSIS — G43819 Other migraine, intractable, without status migrainosus: Secondary | ICD-10-CM | POA: Diagnosis not present

## 2019-08-15 ENCOUNTER — Other Ambulatory Visit: Payer: Self-pay | Admitting: Otolaryngology

## 2019-08-15 DIAGNOSIS — E041 Nontoxic single thyroid nodule: Secondary | ICD-10-CM

## 2019-08-19 ENCOUNTER — Ambulatory Visit
Admission: RE | Admit: 2019-08-19 | Discharge: 2019-08-19 | Disposition: A | Discharge status: Home / Self Care | Payer: Medicare Other | Source: Ambulatory Visit | Attending: Otolaryngology | Admitting: Otolaryngology

## 2019-08-19 ENCOUNTER — Other Ambulatory Visit: Payer: Self-pay

## 2019-08-19 DIAGNOSIS — E041 Nontoxic single thyroid nodule: Secondary | ICD-10-CM | POA: Diagnosis not present

## 2019-08-19 IMAGING — US US THYROID
1 series · 12 of 25 positions shown · non-contrast
Comparison: [DATE]

CLINICAL DATA: Thyroid nodule follow-up . The patient is status
post prior left thyroidectomy.

EXAM:
THYROID ULTRASOUND
TECHNIQUE: Ultrasound examination of the thyroid gland and adjacent soft
tissues was performed.

[Series 1: us thyroid · 0.06mm/px · 12 of 31 slices shown]
[im 2/31]
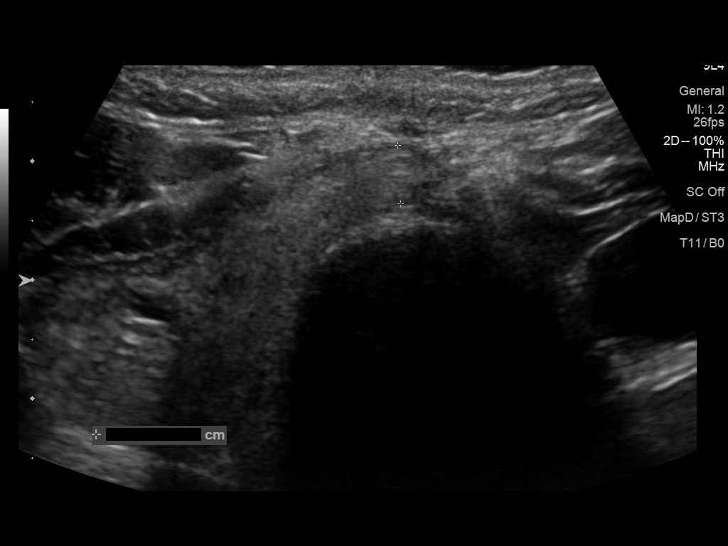
[im 4/31]
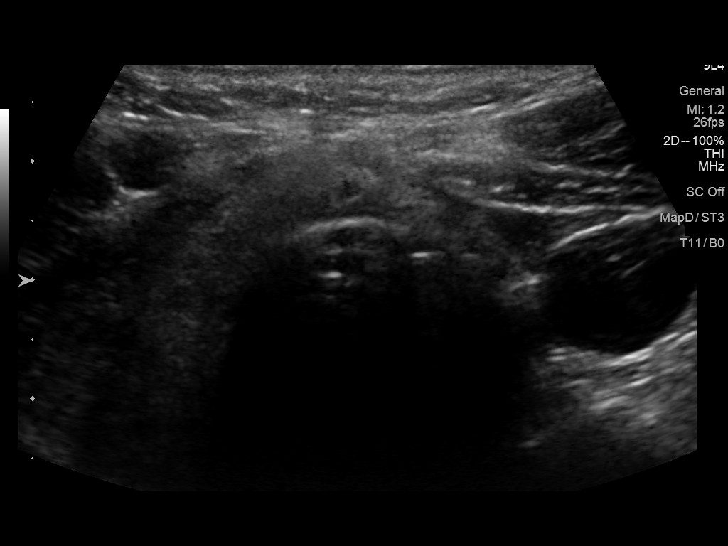
[im 7/31]
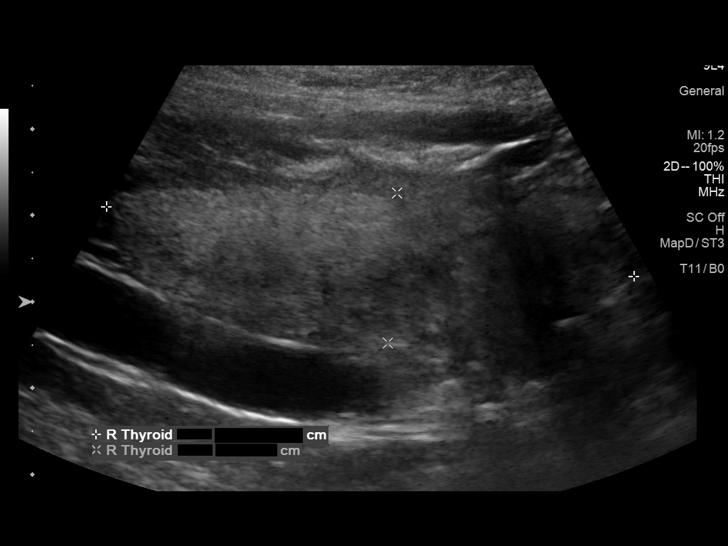
[im 9/31]
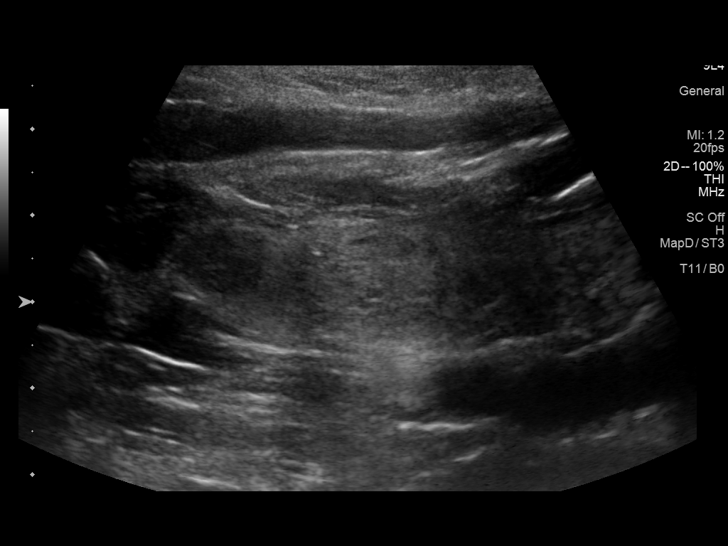
[im 12/31]
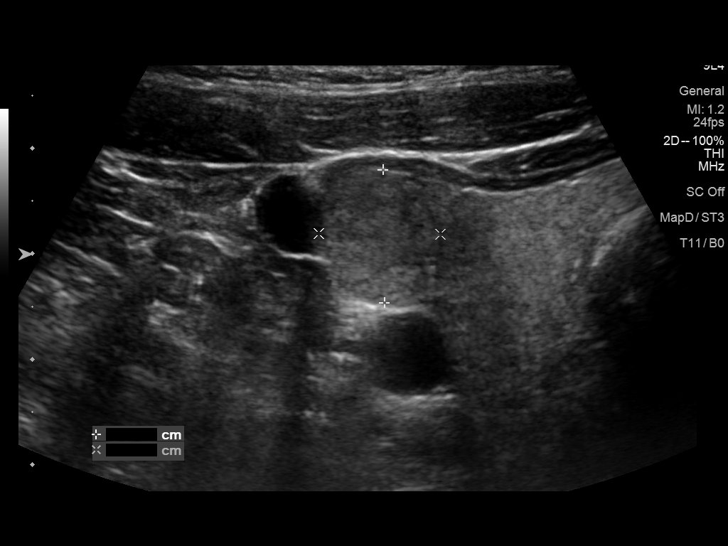
[im 14/31]
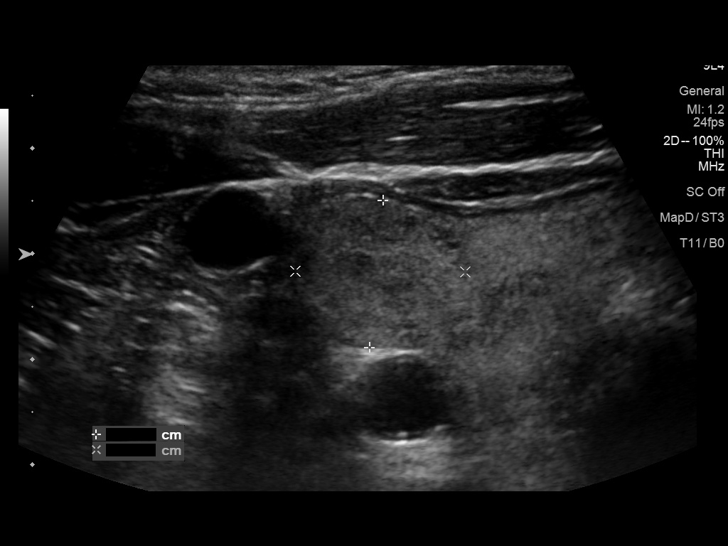
[im 17/31]
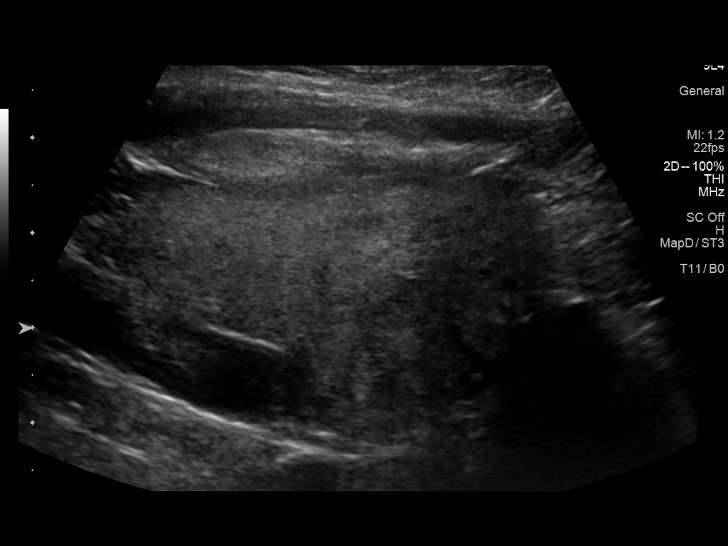
[im 19/31]
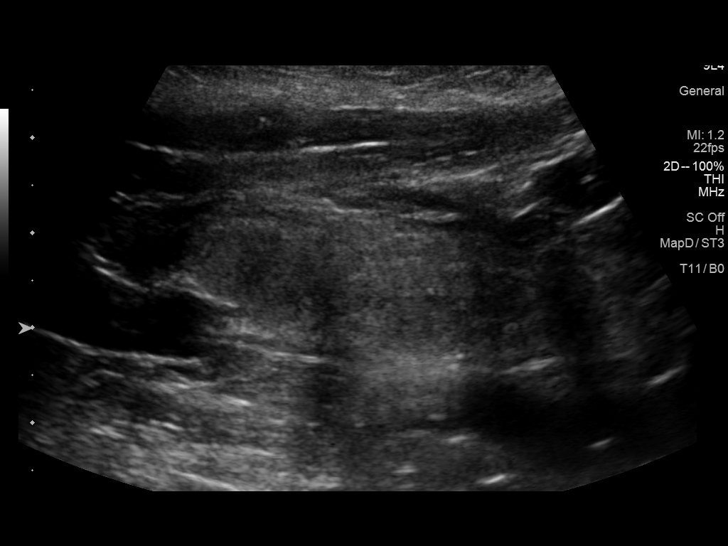
[im 22/31]
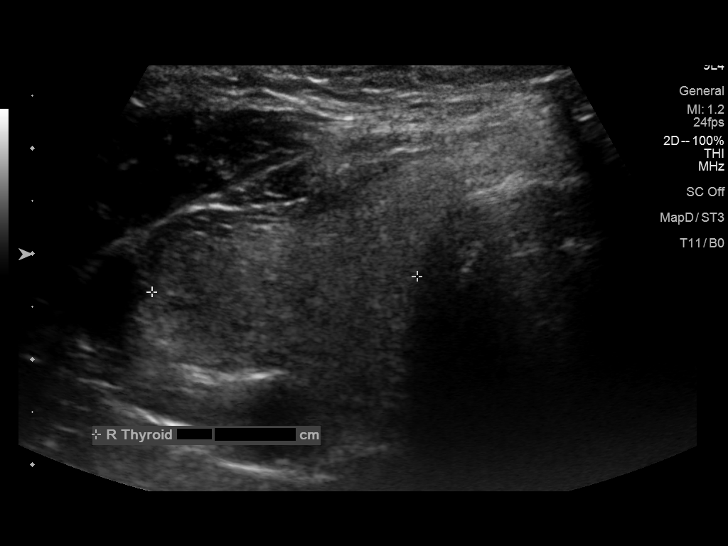
[im 24/31]
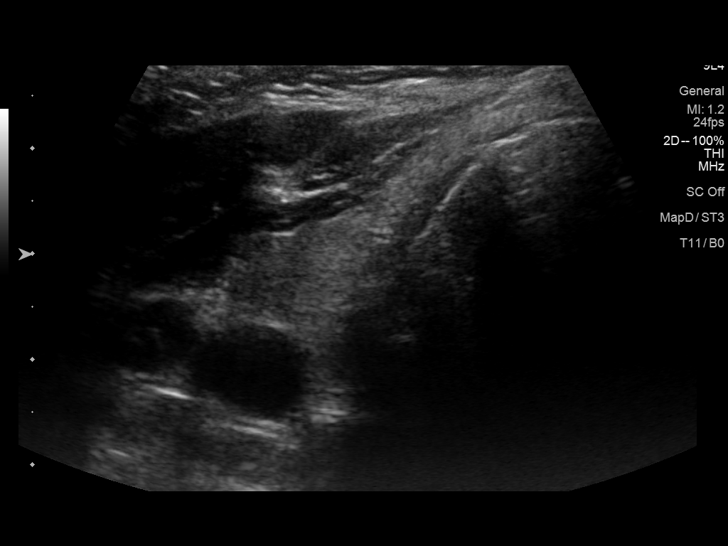
[im 27/31]
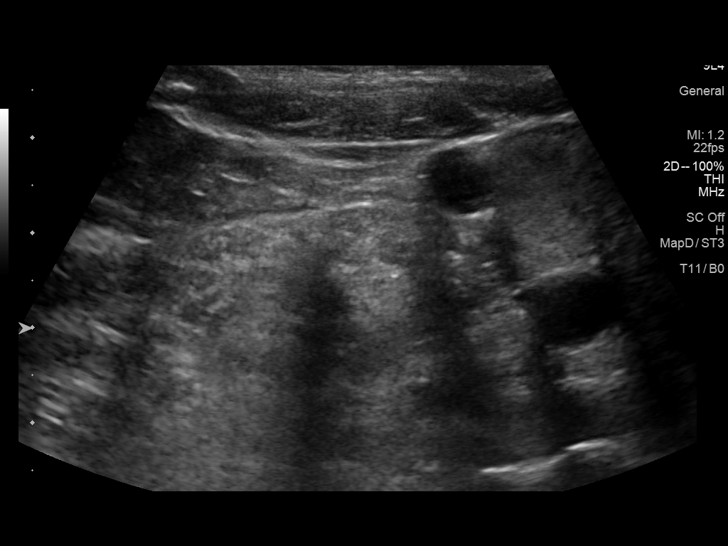
[im 29/31]
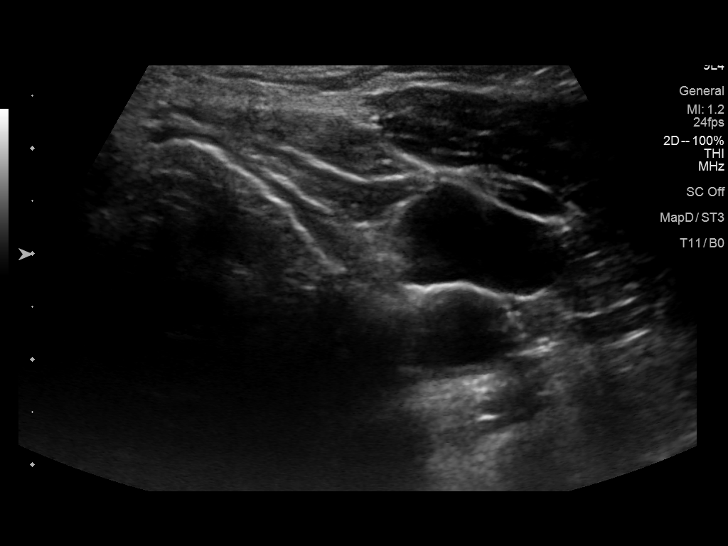

[12 of 25 positions shown; findings below may reference images not displayed]

FINDINGS: Parenchymal Echotexture: Markedly heterogenous

Isthmus: 0.5 cm

Right lobe: 6.2 x 1.7 x 2.5 cm

Left lobe: Is surgically absent

_________________________________________________________

Estimated total number of nodules >/= 1 cm: 2

Number of spongiform nodules >/=  2 cm not described below (TR1): 0

Number of mixed cystic and solid nodules >/= 1.5 cm not described
below (TR2): 0

_________________________________________________________

Nodule # 1:

Prior biopsy: Yes

Location: Right; Superior

Maximum size: 1.7 cm; Other 2 dimensions: 1.3 x 1.2 cm, previously,
1.9 x 1.2 x 1.2 cm

Composition: solid/almost completely solid (2)

Echogenicity: isoechoic (1)

Shape: taller-than-wide (3)

Margins: smooth (0)

Echogenic foci: none (0)

ACR TI-RADS total points: 6.

ACR TI-RADS risk category:  TR4 (4-6 points).

Significant change in size (>/= 20% in two dimensions and minimal
increase of 2 mm): No

Change in features: No

Change in ACR TI-RADS risk category: No

ACR TI-RADS recommendations:

This thyroid nodule was previously biopsied. Correlation with prior
biopsy results is recommended.

_________________________________________________________

Nodule # 2:

Prior biopsy: No

Location: Right; Mid

Maximum size: 1.6 cm; Other 2 dimensions: 1.6 x 1.4 cm, previously,
1.6 x 1.5 x 1.1 cm

Composition: solid/almost completely solid (2)

Echogenicity: isoechoic (1)

Shape: not taller-than-wide (0)

Margins: smooth (0)

Echogenic foci: none (0)

ACR TI-RADS total points: 3.

ACR TI-RADS risk category:  TR3 (3 points).

Significant change in size (>/= 20% in two dimensions and minimal
increase of 2 mm): No

Change in features: No

Change in ACR TI-RADS risk category: No

ACR TI-RADS recommendations:

*Given size (>/= 1.5 - 2.4 cm) and appearance, a follow-up
ultrasound in 1 year should be considered based on TI-RADS criteria.

_________________________________________________________

Nodule # 3:

Prior biopsy: No

Location: Right; Inferior

Maximum size: 1.5 cm; Other 2 dimensions: 1.5 x 1.2 cm, previously,
1.4 x 1.3 x 0.7 cm

Composition: solid/almost completely solid (2)

Echogenicity: isoechoic (1)

Shape: taller-than-wide (3)

Margins: ill-defined (0)

Echogenic foci: none (0)

ACR TI-RADS total points: 6.

ACR TI-RADS risk category:  TR4 (4-6 points).

Significant change in size (>/= 20% in two dimensions and minimal
increase of 2 mm): No

Change in features: No

Change in ACR TI-RADS risk category: No

ACR TI-RADS recommendations:

**Given size (>/= 1.5 cm) and appearance, fine needle aspiration of
this moderately suspicious nodule should be considered based on
TI-RADS criteria.

_________________________________________________________

There are no pathologically enlarged lymph nodes.
IMPRESSION: 1. Status post left-sided thyroidectomy.
2. Multiple distinct thyroid nodules are noted on the right as
detailed above. All are stable from prior study in [31].
3. The 1.7 cm thyroid nodule in the right superior thyroid gland was
previously biopsied. Correlation with prior biopsy results is
recommended.
4. The 1.6 cm thyroid nodule in the right mid thyroid gland meets
criteria for a 1 year follow-up ultrasound.
5. The 1.5 cm thyroid nodule in the right inferior thyroid gland
meets criteria for FNA despite its stability from prior study. This
is primarily secondary to its shape which now appears taller than it
is wide.

The above is in keeping with the ACR TI-RADS recommendations - [HOSPITAL] [31];[DATE].

## 2019-08-21 NOTE — Progress Notes (Signed)
Cardiology Office Note   Date:  08/22/2019   ID:  Cory Mathis, DOB July 29, 1950, MRN [id number]  PCP:  Cory Ruff, MD  Cardiologist:   Cory Carnes, MD    Pt presents for f/u of CAD and PAF   History of Present Illness: Cory Mathis is a 69 y.o. male with a history ofHTN, HL, CAD by CT (Ca score of 189)  The pt was seen in ED  In June with atrial fibrillation   Started on Xarelto and Cardiazem   This was switched to Xarelto and metoprolol  The pt was cardioverted on 7/13.    He was bradycardic after and metoprolol was stopped    I saw the pt as a televisit in fall 2020    Since seen the pt denies palpitations   He denies CP   No SOB   He does says he is cold all the time   Wonders if it is his meds    Current Meds  Medication Sig  . amLODipine (NORVASC) 5 MG tablet TAKE 1 TABLET(5 MG) BY MOUTH TWICE DAILY  . Carboxymethylcellulose Sodium (ARTIFICIAL TEARS OP) Place 1 drop into both eyes daily as needed (dry eyes).  . famotidine (PEPCID) 20 MG tablet Take 20 mg by mouth at bedtime as needed for heartburn or indigestion.  Marland Kitchen oxymetazoline (AFRIN) 0.05 % nasal spray Place 2 sprays into both nostrils daily as needed for congestion.  [name] Apply to eye.  . Polyvinyl Alcohol-Povidone (REFRESH OP) Apply to eye.  . Probiotic Product (PROBIOTIC PO) Take 1 tablet by mouth daily.  . rosuvastatin (CRESTOR) 5 MG tablet Take 0.5 tablets (2.5 mg total) by mouth daily.  [name] TAKE 1 TABLET(20 MG) BY MOUTH DAILY WITH SUPPER  . zolpidem (AMBIEN CR) 12.5 MG CR tablet Take 12.5 mg by mouth at bedtime.      Allergies:   Sulfa antibiotics, Sulfasalazine, Metronidazole, and Other   Past Medical History:  Diagnosis Date  . BPH (benign prostatic hyperplasia)   . Diverticulosis of colon   . GERD (gastroesophageal reflux disease)    prn med. control  . History of adenomatous polyp of colon    03-29-2007  tubular adenoma/   08-07-2016  hyperplastic and tubular adenoma's  . History of alcoholic gastritis    [id number]  gastritis, esophagitis, peptic duodenitis  . History of colonic diverticulitis    01-31-2016  resolved w/ medications  . History of Helicobacter pylori infection    10-30-2008  . 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 Internal hemorrhoids   . Memory loss   . OSA on CPAP    CPAP nightly  . Right thyroid nodule   . Urinary retention 12/13/2016    Past Surgical History:  Procedure Laterality Date  . CARDIOVERSION N/A 12/23/2018   Procedure: CARDIOVERSION;  Surgeon: Buford Dresser, MD;  Location: [facility] ENDOSCOPY;  Service: Cardiovascular;  Laterality: N/A;  . COLONOSCOPY  last one 08-07-2016  . HEMORRHOID BANDING  10-20-2016;  09-11-2016  . HERNIA REPAIR    . KNEE ARTHROSCOPY Bilateral    10 + yrs ago  . LASIK Bilateral   . PILONIDAL CYST EXCISION    . THYROIDECTOMY Left 2013  . TRANSURETHRAL RESECTION OF PROSTATE N/A 01/04/2017   Procedure: TRANSURETHRAL RESECTION OF THE PROSTATE (TURP);  Surgeon: Franchot Gallo, MD;  Location: [facility];  Service: Urology;  Laterality:  N/A;  . UPPER GASTROINTESTINAL ENDOSCOPY  10/30/2008  . WISDOM TOOTH EXTRACTION       Social History:  The patient  reports that he has quit smoking. He has never used smokeless tobacco. He reports current alcohol use. He reports that he does not use drugs.   Family History:  The patient's family history includes Heart attack in his mother; Other in his father and sister; Prostate cancer in his maternal grandfather.    ROS:  Please see the history of present illness. All other systems are reviewed and  Negative to the above problem except as noted.    PHYSICAL EXAM: VS:  BP 140/68   Pulse (!) 56   Ht 5' 10.5" (1.791 m)   Wt 206 lb (93.4 kg)   SpO2 96%   BMI 29.14 kg/m   GEN: Well nourished, well developed, in no acute distress    HEENT: normal  Neck: no JVD, carotid bruits Cardiac: RRR; no murmurs, rubs, or gallops,no edema  Respiratory:  clear to auscultation bilaterally, normal work of breathing GI: soft, nontender, nondistended, + BS  No hepatomegaly  MS: no deformity Moving all extremities   Skin: warm and dry, no rash Neuro:  Strength and sensation are intact Psych: euthymic mood, full affect   EKG:  EKG is not ordered today.   Lipid Panel    Component Value Date/Time   CHOL 111 04/18/2019 0803   TRIG 74 04/18/2019 0803   HDL 40 04/18/2019 0803   CHOLHDL 2.8 04/18/2019 0803   LDLCALC 56 04/18/2019 0803      Wt Readings from Last 3 Encounters:  08/22/19 206 lb (93.4 kg)  02/20/19 200 lb (90.7 kg)  01/13/19 203 lb 12.8 oz (92.4 kg)      ASSESSMENT AND PLAN:  1  CAD   PT with coronary calcifications    Denies angina     2  PAF  No symptomatic spells     Keep on Xarelto  3  HL  Continue Crestor Last lipids excellent   LDL 47   HDL 38    Plan for f/u next fall      Current medicines are reviewed at length with the patient today.  The patient does not have concerns regarding medicines.  Signed, Cory Carnes, MD  08/22/2019 2:12 PM    [facility] [address], [place], [place]  [address] Phone: [phone]; Fax: [phone]

## 2019-08-22 ENCOUNTER — Ambulatory Visit (INDEPENDENT_AMBULATORY_CARE_PROVIDER_SITE_OTHER): Payer: Medicare Other | Admitting: Internal Medicine

## 2019-08-22 ENCOUNTER — Encounter: Payer: Self-pay | Admitting: Internal Medicine

## 2019-08-22 ENCOUNTER — Other Ambulatory Visit: Payer: Self-pay

## 2019-08-22 VITALS — BP 140/68 | HR 56 | Ht 70.5 in | Wt 206.0 lb

## 2019-08-22 DIAGNOSIS — I4819 Other persistent atrial fibrillation: Secondary | ICD-10-CM | POA: Diagnosis not present

## 2019-08-22 DIAGNOSIS — R002 Palpitations: Secondary | ICD-10-CM

## 2019-08-22 DIAGNOSIS — E785 Hyperlipidemia, unspecified: Secondary | ICD-10-CM | POA: Diagnosis not present

## 2019-08-22 NOTE — Patient Instructions (Addendum)
Medication Instructions:  No changes *If you need a refill on your cardiac medications before your next appointment, please call your pharmacy*   Lab Work: Today: cbc, bmet, lipids, tsh If you have labs (blood work) drawn today and your tests are completely normal, you will receive your results only by: Marland Kitchen MyChart Message (if you have MyChart) OR . A paper copy in the mail If you have any lab test that is abnormal or we need to change your treatment, we will call you to review the results.   Testing/Procedures: none   Follow-Up: At [facility], you and your health needs are our priority.  As part of our continuing mission to provide you with exceptional heart care, we have created designated Provider Care Teams.  These Care Teams include your primary Cardiologist (physician) and Advanced Practice Providers (APPs -  Physician Assistants and Nurse Practitioners) who all work together to provide you with the care you need, when you need it.  Your next appointment:   8 month(s)  The format for your next appointment:   In Person  Provider:   You may see Dorris Carnes, MD or one of the following Advanced Practice Providers on your designated Care Team:    Richardson Dopp, PA-C  [place], [place]  Daune Perch, NP    Other Instructions

## 2019-08-23 LAB — BASIC METABOLIC PANEL
BUN/Creatinine Ratio: 11 (ref 10–24)
BUN: 10 mg/dL (ref 8–27)
CO2: 25 mmol/L (ref 20–29)
Calcium: 9.4 mg/dL (ref 8.6–10.2)
Chloride: 101 mmol/L (ref 96–106)
Creatinine, Ser: 0.93 mg/dL (ref 0.76–1.27)
GFR calc Af Amer: 96 mL/min/{1.73_m2} (ref >59)
GFR calc non Af Amer: 83 mL/min/{1.73_m2} (ref >59)
Glucose: 91 mg/dL (ref 65–99)
Potassium: 4.4 mmol/L (ref 3.5–5.2)
Sodium: 139 mmol/L (ref 134–144)

## 2019-08-23 LAB — CBC
Hematocrit: 42.3 % (ref 37.5–51.0)
Hemoglobin: 14.1 g/dL (ref 13.0–17.7)
MCH: 32 pg (ref 26.6–33.0)
MCHC: 33.3 g/dL (ref 31.5–35.7)
MCV: 96 fL (ref 79–97)
Platelets: 316 10*3/uL (ref 150–450)
RBC: 4.4 x10E6/uL (ref 4.14–5.80)
RDW: 12.8 % (ref 11.6–15.4)
WBC: 8.8 10*3/uL (ref 3.4–10.8)

## 2019-08-23 LAB — LIPID PANEL
Chol/HDL Ratio: 2.8 ratio (ref 0.0–5.0)
Cholesterol, Total: 106 mg/dL (ref 100–199)
HDL: 38 mg/dL — ABNORMAL LOW (ref >39)
LDL Chol Calc (NIH): 47 mg/dL (ref 0–99)
Triglycerides: 118 mg/dL (ref 0–149)
VLDL Cholesterol Cal: 21 mg/dL (ref 5–40)

## 2019-08-23 LAB — TSH: TSH: 0.951 u[IU]/mL (ref 0.450–4.500)

## 2019-09-01 DIAGNOSIS — H2512 Age-related nuclear cataract, left eye: Secondary | ICD-10-CM | POA: Diagnosis not present

## 2019-09-01 DIAGNOSIS — H25812 Combined forms of age-related cataract, left eye: Secondary | ICD-10-CM | POA: Diagnosis not present

## 2019-09-08 DIAGNOSIS — J343 Hypertrophy of nasal turbinates: Secondary | ICD-10-CM | POA: Diagnosis not present

## 2019-09-08 DIAGNOSIS — E041 Nontoxic single thyroid nodule: Secondary | ICD-10-CM | POA: Diagnosis not present

## 2019-09-08 DIAGNOSIS — J342 Deviated nasal septum: Secondary | ICD-10-CM | POA: Diagnosis not present

## 2019-10-23 DIAGNOSIS — H04123 Dry eye syndrome of bilateral lacrimal glands: Secondary | ICD-10-CM | POA: Diagnosis not present

## 2019-10-23 DIAGNOSIS — H43811 Vitreous degeneration, right eye: Secondary | ICD-10-CM | POA: Diagnosis not present

## 2019-10-23 DIAGNOSIS — D3131 Benign neoplasm of right choroid: Secondary | ICD-10-CM | POA: Diagnosis not present

## 2019-10-23 DIAGNOSIS — G43819 Other migraine, intractable, without status migrainosus: Secondary | ICD-10-CM | POA: Diagnosis not present

## 2019-10-27 DIAGNOSIS — H2511 Age-related nuclear cataract, right eye: Secondary | ICD-10-CM | POA: Diagnosis not present

## 2019-11-02 ENCOUNTER — Other Ambulatory Visit: Payer: Self-pay | Admitting: Internal Medicine

## 2019-11-03 NOTE — Telephone Encounter (Signed)
Prescription refill request for Xarelto received.   Last office visit: Cory Mathis 08/22/2019 Weight: 93.4 kg Age: 69 y.o. Scr: 0.93, 08/22/2019 CrCl: 82 ml/min   Prescription refill sent.

## 2019-11-12 DIAGNOSIS — D3131 Benign neoplasm of right choroid: Secondary | ICD-10-CM | POA: Diagnosis not present

## 2019-11-12 DIAGNOSIS — H35372 Puckering of macula, left eye: Secondary | ICD-10-CM | POA: Diagnosis not present

## 2019-11-12 DIAGNOSIS — H43813 Vitreous degeneration, bilateral: Secondary | ICD-10-CM | POA: Diagnosis not present

## 2019-11-12 DIAGNOSIS — H43822 Vitreomacular adhesion, left eye: Secondary | ICD-10-CM | POA: Diagnosis not present

## 2019-11-18 DIAGNOSIS — H268 Other specified cataract: Secondary | ICD-10-CM | POA: Diagnosis not present

## 2019-11-18 DIAGNOSIS — H2511 Age-related nuclear cataract, right eye: Secondary | ICD-10-CM | POA: Diagnosis not present

## 2019-12-26 ENCOUNTER — Telehealth: Payer: Self-pay | Admitting: *Deleted

## 2019-12-26 DIAGNOSIS — H43391 Other vitreous opacities, right eye: Secondary | ICD-10-CM | POA: Diagnosis not present

## 2019-12-26 DIAGNOSIS — D3131 Benign neoplasm of right choroid: Secondary | ICD-10-CM | POA: Diagnosis not present

## 2019-12-26 DIAGNOSIS — H35371 Puckering of macula, right eye: Secondary | ICD-10-CM | POA: Diagnosis not present

## 2019-12-26 DIAGNOSIS — H43813 Vitreous degeneration, bilateral: Secondary | ICD-10-CM | POA: Diagnosis not present

## 2019-12-26 NOTE — Telephone Encounter (Signed)
° °  [facility] Pre-operative Risk Assessment    HEARTCARE STAFF: - Please ensure there is not already an duplicate clearance open for this procedure. - Under Visit Info/Reason for Call, type in Other and utilize the format Clearance MM/DD/YY or Clearance TBD. Do not use dashes or single digits. - If request is for dental extraction, please clarify the # of teeth to be extracted.  Request for surgical clearance:  1. What type of surgery is being performed? Vitrectomy, membrane peel, removal of posterior capsular opacification, right eye   2. When is this surgery scheduled? TBD   3. What type of clearance is required (medical clearance vs. Pharmacy clearance to hold med vs. Both)? Both  4. Are there any medications that need to be held prior to surgery and how long?Xarelto, 7 days prior to procedure  5. Practice name and name of physician performing surgery? [facility]; Sherlynn Stalls, MD   6. What is the office phone number? [phone]   7.   What is the office fax number? [phone]  8.   Anesthesia type (None, local, MAC, general) ? MAC   Neddie Steedman L 12/26/2019, 4:31 PM  _________________________________________________________________   (provider comments below)

## 2019-12-29 NOTE — Telephone Encounter (Signed)
Sent to pharm and then will need to be called.  Kerin Ransom PA-C 12/29/2019 10:09 AM

## 2019-12-29 NOTE — Telephone Encounter (Signed)
Patient with diagnosis of afib on Xarelto for anticoagulation.    Procedure: Vitrectomy, membrane peel, removal of posterior capsular opacification, right eye  Date of procedure: TBD  CHADS2-VASc score of 3 (age, HTN, CAD)  CrCl 27mL/min Platelet count 317K  Request is to hold Xarelto for 7 days prior which is excessive since Xarelto will be cleared in 1 day. Would be ok to hold for 2-3 days max prior to procedure since stroke risk is relatively low.

## 2019-12-29 NOTE — Telephone Encounter (Signed)
   Primary Cardiologist: Dorris Carnes, MD  Chart reviewed and patient contacted by phone today as part of pre-operative protocol coverage. Given past medical history and time since last visit, based on ACC/AHA guidelines, Pinchos Topel would be at acceptable risk for the planned procedure without further cardiovascular testing.   OK to hold Xarelto 2-3 days maximum pre op.  Resume as soon as possible post op.  I will route this recommendation to the requesting party via Epic fax function and remove from pre-op pool.  Please call with questions.  Kerin Ransom, PA-C 12/29/2019, 2:43 PM

## 2019-12-29 NOTE — Telephone Encounter (Signed)
Left message to call back to discuss surgery and Xarelto.  Kerin Ransom PA-C 12/29/2019 1:55 PM

## 2019-12-29 NOTE — Telephone Encounter (Signed)
Patient is returning call.  °

## 2020-01-01 ENCOUNTER — Other Ambulatory Visit: Payer: Self-pay | Admitting: Internal Medicine

## 2020-01-05 DIAGNOSIS — H35371 Puckering of macula, right eye: Secondary | ICD-10-CM | POA: Diagnosis not present

## 2020-01-05 DIAGNOSIS — H26491 Other secondary cataract, right eye: Secondary | ICD-10-CM | POA: Diagnosis not present

## 2020-01-13 DIAGNOSIS — H35371 Puckering of macula, right eye: Secondary | ICD-10-CM | POA: Diagnosis not present

## 2020-02-03 DIAGNOSIS — H35371 Puckering of macula, right eye: Secondary | ICD-10-CM | POA: Diagnosis not present

## 2020-02-14 DIAGNOSIS — Z23 Encounter for immunization: Secondary | ICD-10-CM | POA: Diagnosis not present

## 2020-03-05 DIAGNOSIS — H35433 Paving stone degeneration of retina, bilateral: Secondary | ICD-10-CM | POA: Diagnosis not present

## 2020-03-05 DIAGNOSIS — H43811 Vitreous degeneration, right eye: Secondary | ICD-10-CM | POA: Diagnosis not present

## 2020-03-05 DIAGNOSIS — D3131 Benign neoplasm of right choroid: Secondary | ICD-10-CM | POA: Diagnosis not present

## 2020-03-05 DIAGNOSIS — G43819 Other migraine, intractable, without status migrainosus: Secondary | ICD-10-CM | POA: Diagnosis not present

## 2020-03-05 DIAGNOSIS — H43812 Vitreous degeneration, left eye: Secondary | ICD-10-CM | POA: Diagnosis not present

## 2020-03-05 DIAGNOSIS — H4312 Vitreous hemorrhage, left eye: Secondary | ICD-10-CM | POA: Diagnosis not present

## 2020-03-05 DIAGNOSIS — H04123 Dry eye syndrome of bilateral lacrimal glands: Secondary | ICD-10-CM | POA: Diagnosis not present

## 2020-03-07 NOTE — Progress Notes (Signed)
Cardiology Office Note   Date:  03/08/2020   ID:  Cory Mathis, DOB 01-22-1951, MRN [id number]  PCP:  Vernie Shanks, MD  Cardiologist:   Dorris Carnes, MD    Pt presents for f/u of CAD and PAF   History of Present Illness: Cory Mathis is a 69 y.o. male with a history ofHTN, HL, CAD by CT (Ca score of 189)  The pt was seen in ED  In June with atrial fibrillation   Started on Xarelto and Cardiazem   This was switched to Xarelto and metoprolol  The pt was cardioverted on 7/13.    He was bradycardic after and metoprolol was stopped    I saw the pt in clinic in march 2021  Since seen he has done well from a cardiac standpoint  He denies palpitations  No CP  No SOB  No dizziness   He is very active   WOrks out every other day for 1.5 hours   He does note some sensations in feet, hands   Squeezing type   Not associated with activity Has L temporal HA Having some prblems since cataract surgery   BP at home 120s/    No outpatient medications have been marked as taking for the 03/08/20 encounter (Office Visit) with Fay Records, MD.     Allergies:   Other, Sulfa antibiotics, Sulfasalazine, and Metronidazole   Past Medical History:  Diagnosis Date  . BPH (benign prostatic hyperplasia)   . Diverticulosis of colon   . GERD (gastroesophageal reflux disease)    prn med. control  . History of adenomatous polyp of colon    03-29-2007  tubular adenoma/  08-07-2016  hyperplastic and tubular adenoma's  . History of alcoholic gastritis    [id number]  gastritis, esophagitis, peptic duodenitis  . History of colonic diverticulitis    01-31-2016  resolved w/ medications  . History of Helicobacter pylori infection    10-30-2008  . 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 Internal hemorrhoids   . Memory loss   . OSA on CPAP    CPAP nightly  . Right thyroid nodule   . Urinary retention 12/13/2016    Past Surgical History:  Procedure  Laterality Date  . CARDIOVERSION N/A 12/23/2018   Procedure: CARDIOVERSION;  Surgeon: Buford Dresser, MD;  Location: [facility] ENDOSCOPY;  Service: Cardiovascular;  Laterality: N/A;  . COLONOSCOPY  last one 08-07-2016  . HEMORRHOID BANDING  10-20-2016;  09-11-2016  . HERNIA REPAIR    . KNEE ARTHROSCOPY Bilateral    10 + yrs ago  . LASIK Bilateral   . PILONIDAL CYST EXCISION    . THYROIDECTOMY Left 2013  . TRANSURETHRAL RESECTION OF PROSTATE N/A 01/04/2017   Procedure: TRANSURETHRAL RESECTION OF THE PROSTATE (TURP);  Surgeon: Franchot Gallo, MD;  Location: [facility];  Service: Urology;  Laterality: N/A;  . UPPER GASTROINTESTINAL ENDOSCOPY  10/30/2008  . WISDOM TOOTH EXTRACTION       Social History:  The patient  reports that he has quit smoking. He has never used smokeless tobacco. He reports current alcohol use. He reports that he does not use drugs.   Family History:  The patient's family history includes Heart attack in his mother; Other in his father and sister; Prostate cancer in his maternal grandfather.    ROS:  Please see the history of present illness. All other systems are reviewed and  Negative to the  above problem except as noted.    PHYSICAL EXAM: VS:  BP 138/70   Pulse 62   Ht 5\' 11"  (1.803 m)   Wt 204 lb (92.5 kg) Comment: patient refused to weigh,stated weight  BMI 28.45 kg/m   GEN: Well nourished, well developed, in no acute distress  HEENT: normal  Neck: no JVD, no carotid bruits Cardiac: RRR; no murmurs, rubs, or gallops,no LE edema  Respiratory:  clear to auscultation bilaterally GI: soft, nontender, nondistended, + BS  No hepatomegaly  MS: no deformity Moving all extremities   Skin: warm and dry, no rash Neuro:  Strength and sensation are intact Psych: euthymic mood, full affect   EKG:  EKG is ordered today.  SR 62 bpm   RBBB   Lipid Panel    Component Value Date/Time   CHOL 106 08/22/2019 1432   TRIG 118 08/22/2019 1432    HDL 38 (L) 08/22/2019 1432   CHOLHDL 2.8 08/22/2019 1432   LDLCALC 47 08/22/2019 1432      Wt Readings from Last 3 Encounters:  03/08/20 204 lb (92.5 kg)  08/22/19 206 lb (93.4 kg)  02/20/19 200 lb (90.7 kg)      ASSESSMENT AND PLAN:  1  CAD  Pt without symptoms of angina   Very active   Control risk factors   2  PAF  No symptomatic spells     Keep on Xarelto as CHADSVASC is 3  3  HL  Continue Crestor Last lipids excellent   LDL 47   HDL 38    4  HTN   Pt will back down on amlodipine to 1xper day (5 mg)  Follow BP   Goal less than 130/  5  ? Neuropathy  Pt describes abnormal sensations in feet and some in hands  No numbness ?neuropathy   recomm he discuss with PCP or consider referral to neuro    Plan for f/u next July/Aug     Current medicines are reviewed at length with the patient today.  The patient does not have concerns regarding medicines.  Signed, Dorris Carnes, MD  03/08/2020 9:31 AM    [address], [place], [place]  [address] Phone: [phone]; Fax: [phone]

## 2020-03-08 ENCOUNTER — Encounter: Payer: Self-pay | Admitting: Internal Medicine

## 2020-03-08 ENCOUNTER — Other Ambulatory Visit: Payer: Self-pay

## 2020-03-08 ENCOUNTER — Ambulatory Visit (INDEPENDENT_AMBULATORY_CARE_PROVIDER_SITE_OTHER): Payer: Medicare Other | Admitting: Internal Medicine

## 2020-03-08 VITALS — BP 138/70 | HR 62 | Ht 71.0 in | Wt 204.0 lb

## 2020-03-08 DIAGNOSIS — I4819 Other persistent atrial fibrillation: Secondary | ICD-10-CM | POA: Diagnosis not present

## 2020-03-08 DIAGNOSIS — Z23 Encounter for immunization: Secondary | ICD-10-CM | POA: Diagnosis not present

## 2020-03-08 NOTE — Patient Instructions (Signed)
Medication Instructions:  Your physician recommends that you continue on your current medications as directed. Please refer to the Current Medication list given to you today.  *If you need a refill on your cardiac medications before your next appointment, please call your pharmacy*  Follow-Up: At [facility], you and your health needs are our priority.  As part of our continuing mission to provide you with exceptional heart care, we have created designated Provider Care Teams.  These Care Teams include your primary Cardiologist (physician) and Advanced Practice Providers (APPs -  Physician Assistants and Nurse Practitioners) who all work together to provide you with the care you need, when you need it.  Your next appointment:   10 month(s)  The format for your next appointment:   In Person  Provider:   You may see Dorris Carnes, MD or one of the following Advanced Practice Providers on your designated Care Team:    Richardson Dopp, PA-C  [place], [place]

## 2020-03-10 ENCOUNTER — Telehealth: Payer: Self-pay | Admitting: *Deleted

## 2020-03-10 DIAGNOSIS — H4312 Vitreous hemorrhage, left eye: Secondary | ICD-10-CM | POA: Diagnosis not present

## 2020-03-10 DIAGNOSIS — H35433 Paving stone degeneration of retina, bilateral: Secondary | ICD-10-CM | POA: Diagnosis not present

## 2020-03-10 DIAGNOSIS — H26492 Other secondary cataract, left eye: Secondary | ICD-10-CM | POA: Diagnosis not present

## 2020-03-10 NOTE — Telephone Encounter (Signed)
   [facility] Pre-operative Risk Assessment    HEARTCARE STAFF: - Please ensure there is not already an duplicate clearance open for this procedure. - Under Visit Info/Reason for Call, type in Other and utilize the format Clearance MM/DD/YY or Clearance TBD. Do not use dashes or single digits. - If request is for dental extraction, please clarify the # of teeth to be extracted.  Request for surgical clearance: THIS MAY BE A POSSIBLE DUPLICATE; SEE CLEARANCE NOTE 12/2019  1. What type of surgery is being performed? VITRECTOMY AND POSTERIOR CAPSULAR OPACIFICATION REMOVAL    2. When is this surgery scheduled? 03/15/20   3. What type of clearance is required (medical clearance vs. Pharmacy clearance to hold med vs. Both)? BOTH  4. Are there any medications that need to be held prior to surgery and how long? REQUESTING TO HOLD XARELTO BEGINNING TOMORROW 03/11/20   5. Practice name and name of physician performing surgery? [facility]; DR. Corene Cornea SANDERS   6. What is the office phone number? [phone]   7.   What is the office fax number? [phone]  8.   Anesthesia type (None, local, MAC, general) ? MAC   Julaine Hua 03/10/2020, 12:52 PM  _________________________________________________________________   (provider comments below)

## 2020-03-10 NOTE — Telephone Encounter (Addendum)
   Primary Cardiologist: Dorris Carnes, MD  Chart reviewed as part of pre-operative protocol coverage. Patient was contacted 03/10/2020 in reference to pre-operative risk assessment for pending surgery as outlined below.  Cory Mathis was last seen on 03/08/20 by Dr. Harrington Challenger. History of HTN, HLD, CAT by CT, persistent atrial fibrillation s/p DCCV 2020, recently doing well in follow-up. Patient affirms he's felt well. Therefore, based on ACC/AHA guidelines, the patient would be at acceptable risk for the planned procedure without further cardiovascular testing.   Patient had similar clearance (on contralateral eye) sent in 12/2019 at which time the requested holding time was felt excessive since Xarelto will be cleared in 1 day. Our pharmacist/protocol recommended that the patient may hold for 2-3 days max prior to procedure since stroke risk is relatively low (so last dose would be 03/11/20 to allow for no Xarelto 10/1, 10/2, 10/3 then procedure 10/4).  We typically advise that blood thinners be resumed when felt safe by performing physician.   Will route to callback to call surgeon's office to relay recommendation given urgency of clearance requested. If patient's surgeon feels longer hold absolutely necessary, will need to involve DOD for clearance as Dr. Harrington Challenger is not in the office.   Charlie Pitter, PA-C 03/10/2020, 1:36 PM

## 2020-03-10 NOTE — Telephone Encounter (Signed)
Spoke with Tiffany at [employer].  She will get the message over to the surgical coordinator and have them call back.  She was advised that if pt needed to hold Xarelto starting tomorrow, 03/11/20, that we would have to get the DOD involved to make that call.

## 2020-03-15 DIAGNOSIS — H26492 Other secondary cataract, left eye: Secondary | ICD-10-CM | POA: Diagnosis not present

## 2020-03-15 DIAGNOSIS — H43392 Other vitreous opacities, left eye: Secondary | ICD-10-CM | POA: Diagnosis not present

## 2020-03-15 DIAGNOSIS — H43312 Vitreous membranes and strands, left eye: Secondary | ICD-10-CM | POA: Diagnosis not present

## 2020-03-15 DIAGNOSIS — H4312 Vitreous hemorrhage, left eye: Secondary | ICD-10-CM | POA: Diagnosis not present

## 2020-03-19 DIAGNOSIS — G4700 Insomnia, unspecified: Secondary | ICD-10-CM | POA: Diagnosis not present

## 2020-03-19 DIAGNOSIS — G4733 Obstructive sleep apnea (adult) (pediatric): Secondary | ICD-10-CM | POA: Diagnosis not present

## 2020-03-19 DIAGNOSIS — I10 Essential (primary) hypertension: Secondary | ICD-10-CM | POA: Diagnosis not present

## 2020-03-19 DIAGNOSIS — Z79899 Other long term (current) drug therapy: Secondary | ICD-10-CM | POA: Diagnosis not present

## 2020-03-19 DIAGNOSIS — Z125 Encounter for screening for malignant neoplasm of prostate: Secondary | ICD-10-CM | POA: Diagnosis not present

## 2020-03-19 DIAGNOSIS — K219 Gastro-esophageal reflux disease without esophagitis: Secondary | ICD-10-CM | POA: Diagnosis not present

## 2020-03-19 DIAGNOSIS — N400 Enlarged prostate without lower urinary tract symptoms: Secondary | ICD-10-CM | POA: Diagnosis not present

## 2020-03-19 DIAGNOSIS — Z9079 Acquired absence of other genital organ(s): Secondary | ICD-10-CM | POA: Diagnosis not present

## 2020-03-19 DIAGNOSIS — E039 Hypothyroidism, unspecified: Secondary | ICD-10-CM | POA: Diagnosis not present

## 2020-03-22 ENCOUNTER — Ambulatory Visit: Payer: Medicare Other

## 2020-03-22 DIAGNOSIS — Z0000 Encounter for general adult medical examination without abnormal findings: Secondary | ICD-10-CM | POA: Diagnosis not present

## 2020-03-22 DIAGNOSIS — Z1389 Encounter for screening for other disorder: Secondary | ICD-10-CM | POA: Diagnosis not present

## 2020-03-23 DIAGNOSIS — E042 Nontoxic multinodular goiter: Secondary | ICD-10-CM | POA: Diagnosis not present

## 2020-03-23 DIAGNOSIS — J342 Deviated nasal septum: Secondary | ICD-10-CM | POA: Diagnosis not present

## 2020-03-23 DIAGNOSIS — G8929 Other chronic pain: Secondary | ICD-10-CM | POA: Diagnosis not present

## 2020-03-23 DIAGNOSIS — R519 Headache, unspecified: Secondary | ICD-10-CM | POA: Diagnosis not present

## 2020-03-23 DIAGNOSIS — J321 Chronic frontal sinusitis: Secondary | ICD-10-CM | POA: Diagnosis not present

## 2020-03-25 ENCOUNTER — Ambulatory Visit
Admission: RE | Admit: 2020-03-25 | Discharge: 2020-03-25 | Disposition: A | Discharge status: Home / Self Care | Payer: Medicare Other | Source: Ambulatory Visit | Attending: Family Medicine | Admitting: Family Medicine

## 2020-03-25 ENCOUNTER — Other Ambulatory Visit: Payer: Self-pay | Admitting: Family Medicine

## 2020-03-25 DIAGNOSIS — I4510 Unspecified right bundle-branch block: Secondary | ICD-10-CM | POA: Diagnosis not present

## 2020-03-25 DIAGNOSIS — R220 Localized swelling, mass and lump, head: Secondary | ICD-10-CM | POA: Diagnosis not present

## 2020-03-25 DIAGNOSIS — E039 Hypothyroidism, unspecified: Secondary | ICD-10-CM | POA: Diagnosis not present

## 2020-03-25 DIAGNOSIS — G9389 Other specified disorders of brain: Secondary | ICD-10-CM | POA: Diagnosis not present

## 2020-03-25 DIAGNOSIS — Z79899 Other long term (current) drug therapy: Secondary | ICD-10-CM | POA: Diagnosis not present

## 2020-03-25 DIAGNOSIS — R519 Headache, unspecified: Secondary | ICD-10-CM

## 2020-03-25 DIAGNOSIS — R202 Paresthesia of skin: Secondary | ICD-10-CM | POA: Diagnosis not present

## 2020-03-25 DIAGNOSIS — I10 Essential (primary) hypertension: Secondary | ICD-10-CM | POA: Diagnosis not present

## 2020-03-25 DIAGNOSIS — G4733 Obstructive sleep apnea (adult) (pediatric): Secondary | ICD-10-CM | POA: Diagnosis not present

## 2020-03-25 DIAGNOSIS — R413 Other amnesia: Secondary | ICD-10-CM | POA: Diagnosis not present

## 2020-03-25 IMAGING — CT CT HEAD W/O CM
3 of 4 series · 14 of 47 positions shown, 16 images · non-contrast
Comparison: None.

CLINICAL DATA: Headaches.

EXAM:
CT HEAD WITHOUT CONTRAST
TECHNIQUE: Contiguous axial images were obtained from the base of the skull
through the vertex without intravenous contrast.

[Series 2: head 5.00 hr40 s3 axial ibhc · axial · 0.49mm/px · z∈[-694,-554]mm · 8 of 34 slices shown, 10 images]
[im 3/34  brain]
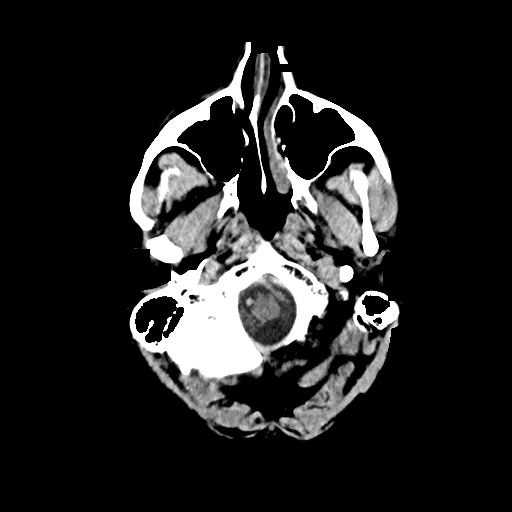
[im 3/34  bone]
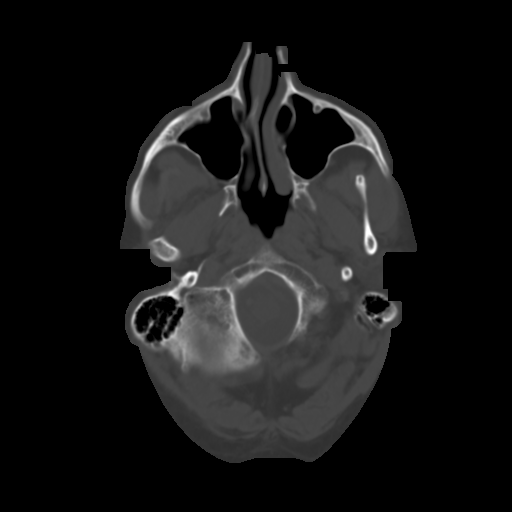
[im 8/34  brain]
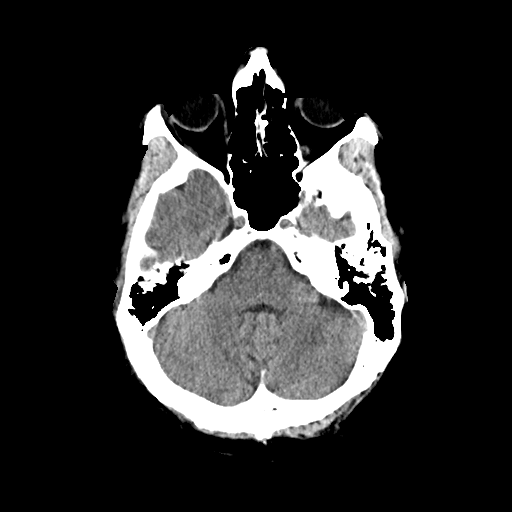
[im 12/34  brain]
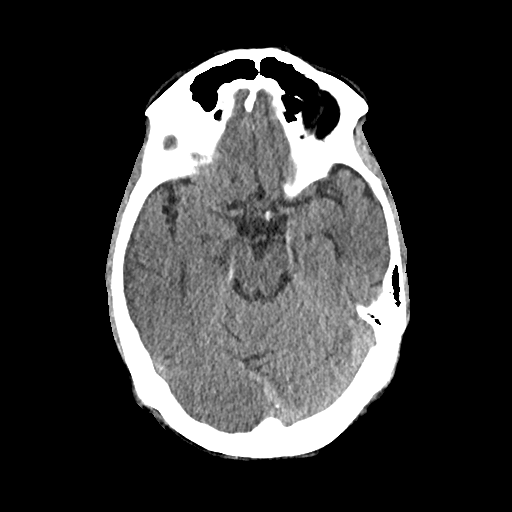
[im 15/34  brain]
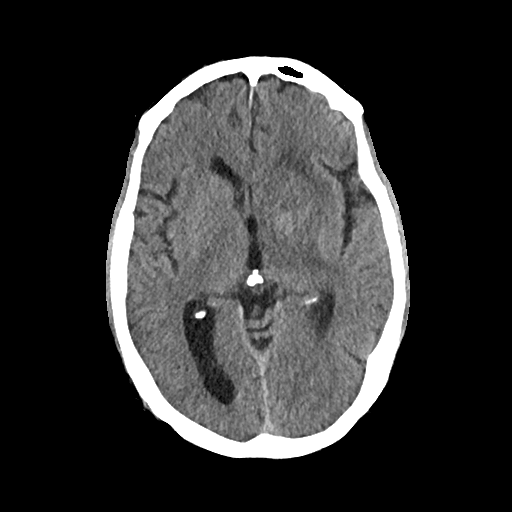
[im 19/34  brain]
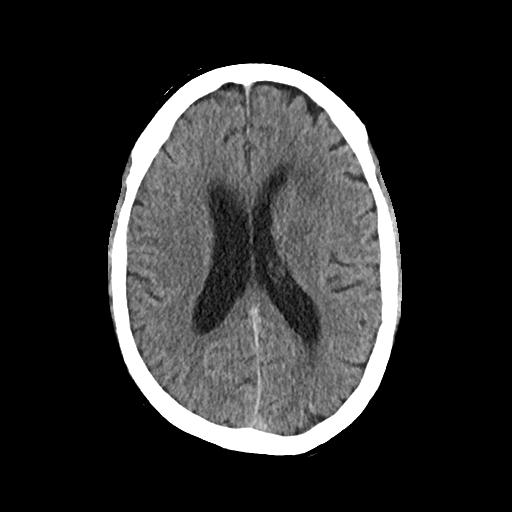
[im 19/34  bone]
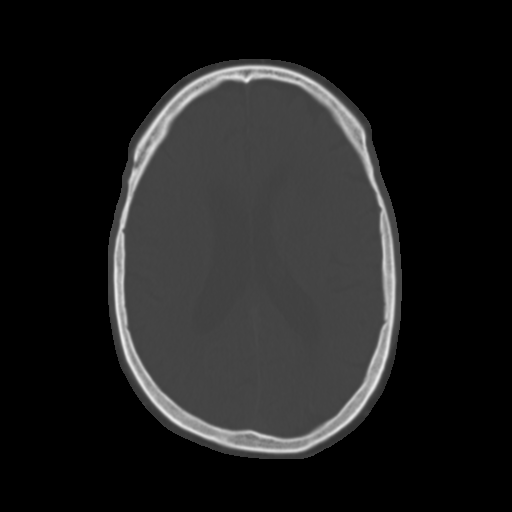
[im 22/34  brain]
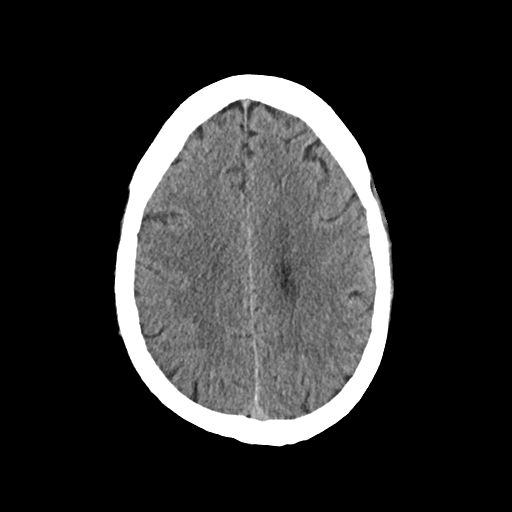
[im 26/34  brain]
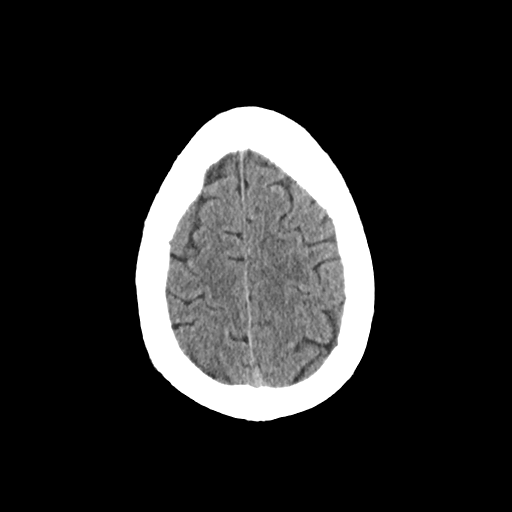
[im 31/34  brain]
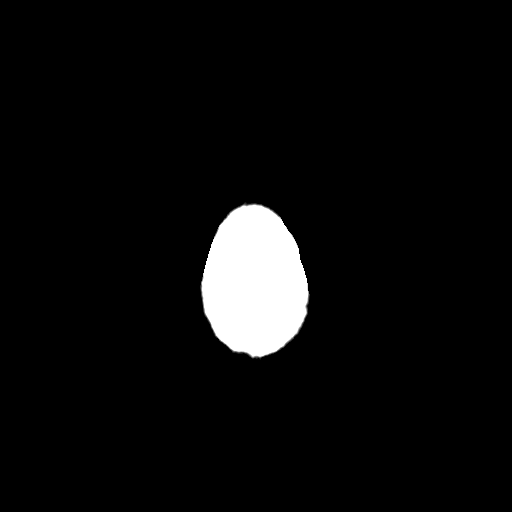

[Series 4: head 3.00 hr40 s3 sag · sagittal · 0.33mm/px · 3 of 84 slices shown]
[im 28/84  brain]
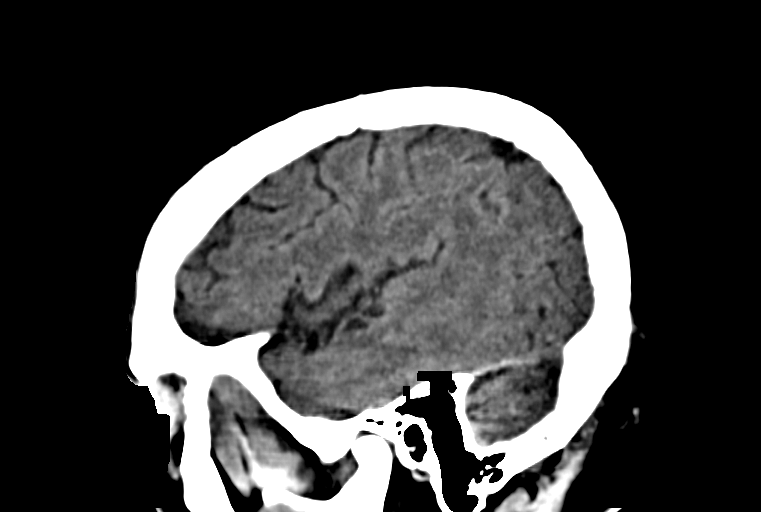
[im 42/84  brain]
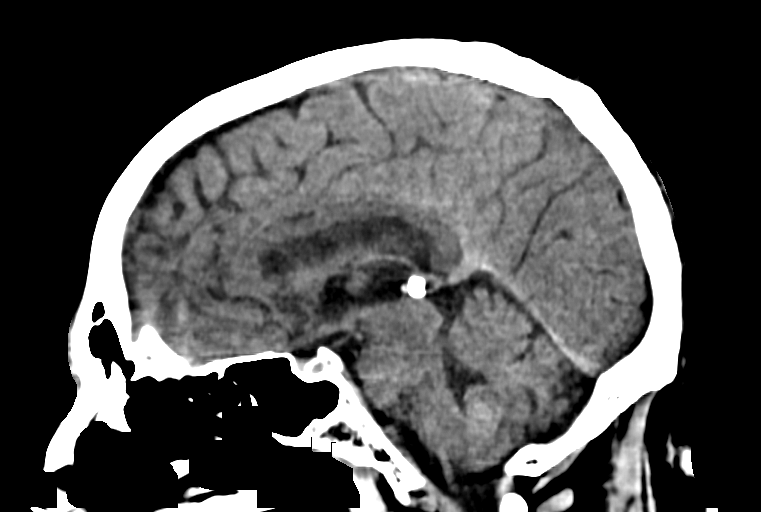
[im 56/84  brain]
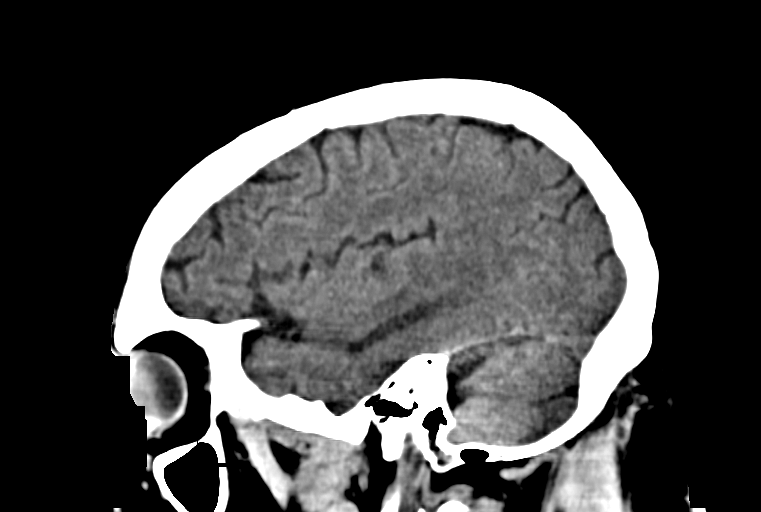

[Series 6: head 3.00 hr40 s3 cor · coronal · 0.33mm/px · 3 of 84 slices shown]
[im 28/84  brain]
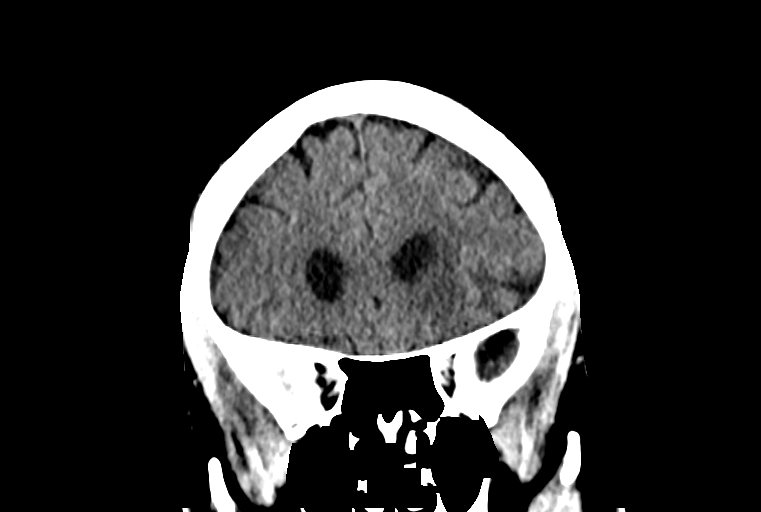
[im 37/84  brain]
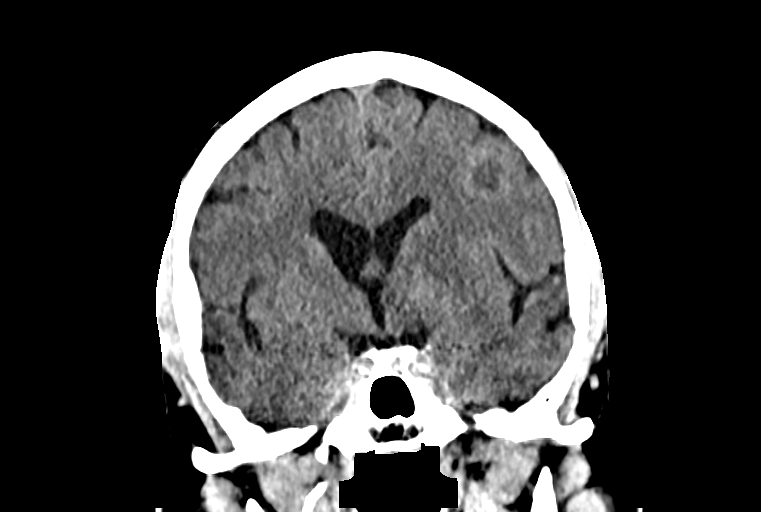
[im 47/84  brain]
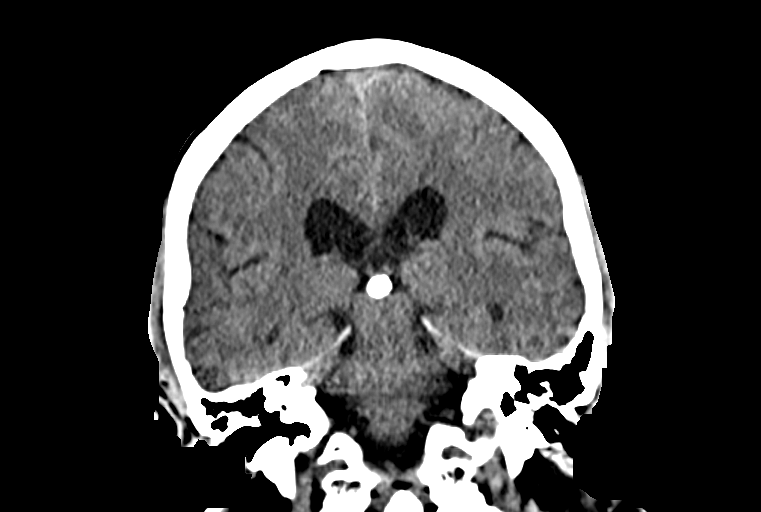

[14 of 47 positions shown; findings below may reference images not displayed]

FINDINGS: Brain: There appears to be an ill-defined mass in the left basal
ganglia measuring at least 3.7 x 2.3 cm, highly concerning for
neoplasm or malignancy. This results in approximately 6 mm of
left-to-right midline shift. Ventricular size is within normal
limits. No definite hemorrhage is noted.

Vascular: No hyperdense vessel or unexpected calcification.

Skull: Normal. Negative for fracture or focal lesion.

Sinuses/Orbits: No acute finding.

Other: None.
IMPRESSION: Ill-defined mass is noted in the left basal ganglia measuring at
least 3.7 x 2.3 cm, highly concerning for neoplasm or malignancy.
This results in approximately 6 mm of left-to-right midline shift.
MRI with and without gadolinium is recommended for further
evaluation. These results will be called to the ordering clinician
or representative by the Radiologist Assistant, and communication
documented in the PACS or zVision Dashboard.

## 2020-03-26 ENCOUNTER — Other Ambulatory Visit (HOSPITAL_COMMUNITY): Payer: Self-pay | Admitting: Neurological Surgery

## 2020-03-26 ENCOUNTER — Other Ambulatory Visit: Payer: Self-pay | Admitting: Neurological Surgery

## 2020-03-26 DIAGNOSIS — N401 Enlarged prostate with lower urinary tract symptoms: Secondary | ICD-10-CM | POA: Diagnosis not present

## 2020-03-26 DIAGNOSIS — N5201 Erectile dysfunction due to arterial insufficiency: Secondary | ICD-10-CM | POA: Diagnosis not present

## 2020-03-26 DIAGNOSIS — D496 Neoplasm of unspecified behavior of brain: Secondary | ICD-10-CM

## 2020-03-26 DIAGNOSIS — N3281 Overactive bladder: Secondary | ICD-10-CM | POA: Diagnosis not present

## 2020-03-26 DIAGNOSIS — R350 Frequency of micturition: Secondary | ICD-10-CM | POA: Diagnosis not present

## 2020-03-29 ENCOUNTER — Other Ambulatory Visit: Payer: Self-pay | Admitting: Radiation Therapy

## 2020-03-29 ENCOUNTER — Other Ambulatory Visit: Payer: Self-pay | Admitting: Internal Medicine

## 2020-03-30 ENCOUNTER — Ambulatory Visit (HOSPITAL_COMMUNITY)
Admission: RE | Admit: 2020-03-30 | Discharge: 2020-03-30 | Disposition: A | Discharge status: Home / Self Care | Payer: Medicare Other | Source: Ambulatory Visit | Attending: Neurological Surgery | Admitting: Neurological Surgery

## 2020-03-30 ENCOUNTER — Other Ambulatory Visit: Payer: Self-pay

## 2020-03-30 DIAGNOSIS — D496 Neoplasm of unspecified behavior of brain: Secondary | ICD-10-CM | POA: Diagnosis not present

## 2020-03-30 DIAGNOSIS — G930 Cerebral cysts: Secondary | ICD-10-CM | POA: Diagnosis not present

## 2020-03-30 DIAGNOSIS — C719 Malignant neoplasm of brain, unspecified: Secondary | ICD-10-CM | POA: Diagnosis not present

## 2020-03-30 DIAGNOSIS — G9389 Other specified disorders of brain: Secondary | ICD-10-CM | POA: Diagnosis not present

## 2020-03-30 IMAGING — MR MR HEAD WO/W CM
9 of 11 series · 26 of 48 positions shown · IV contrast (gadavist)
Comparison: Head CT [DATE].  MRI [DATE].

CLINICAL DATA: Headaches.  Brain tumor.

EXAM:
MRI HEAD WITHOUT AND WITH CONTRAST
TECHNIQUE: Multiplanar, multiecho pulse sequences of the brain and surrounding
structures were obtained without and with intravenous contrast.
CONTRAST:  8mL GADAVIST GADOBUTROL 1 MMOL/ML IV SOLN

[Series 3: DWI · axial · 3.0mm · 0.94mm/px · z∈[-23,+143]mm · 7 of 116 slices shown]
[im 1/116]
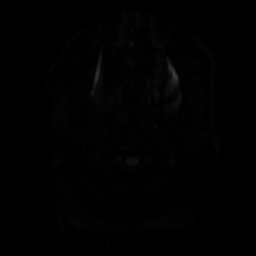
[im 20/116]
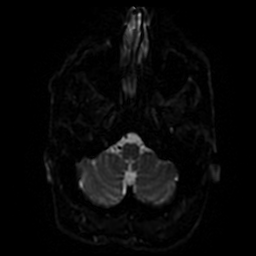
[im 39/116]
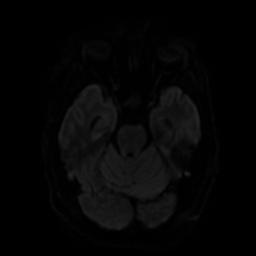
[im 58/116]
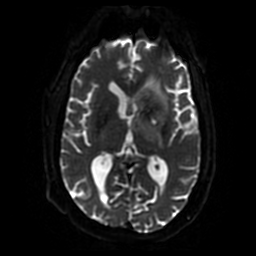
[im 77/116]
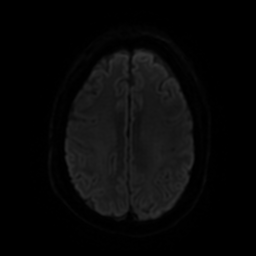
[im 96/116]
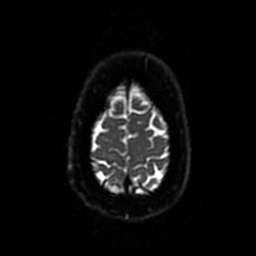
[im 116/116]
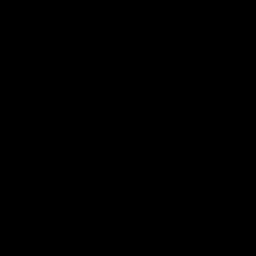

[Series 4: FLAIR · sagittal · 3.0mm · 0.47mm/px · 2 of 43 slices shown (1 of 2)]
[im 1/43]
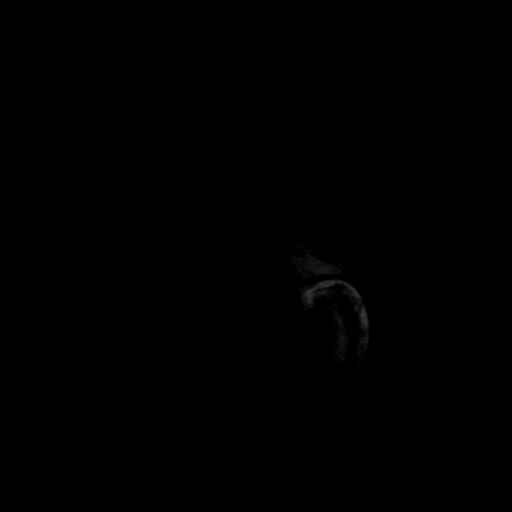
[im 43/43]
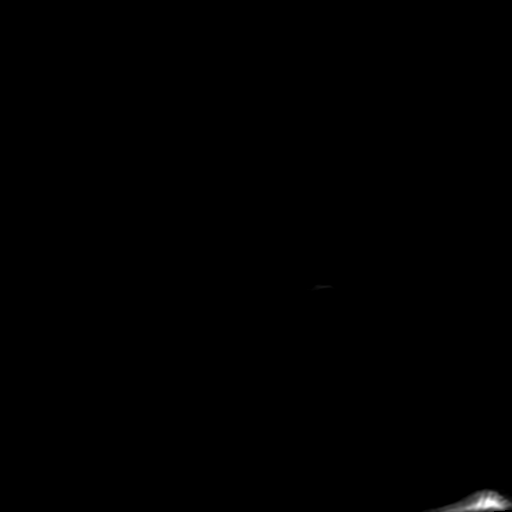

[Series 5: T2 · axial · 5.0mm · 0.47mm/px · z∈[-22,+142]mm · 2 of 29 slices shown]
[im 1/29]
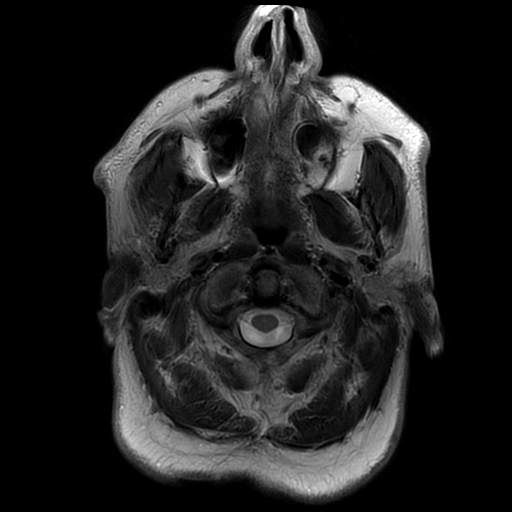
[im 29/29]
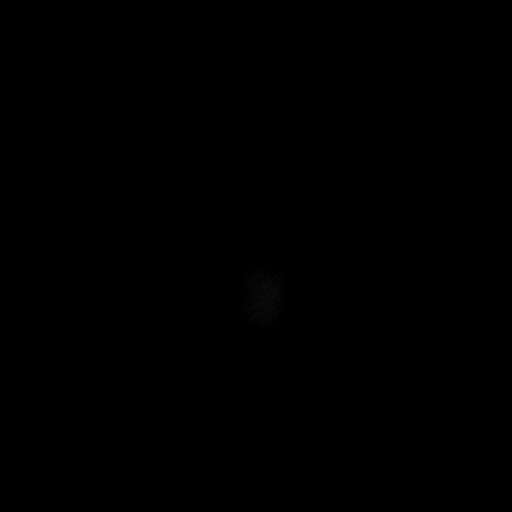

[Series 6: FLAIR · axial · 3.0mm · 0.47mm/px · z∈[-62,+109]mm · 3 of 58 slices shown (2 of 2)]
[im 1/58]
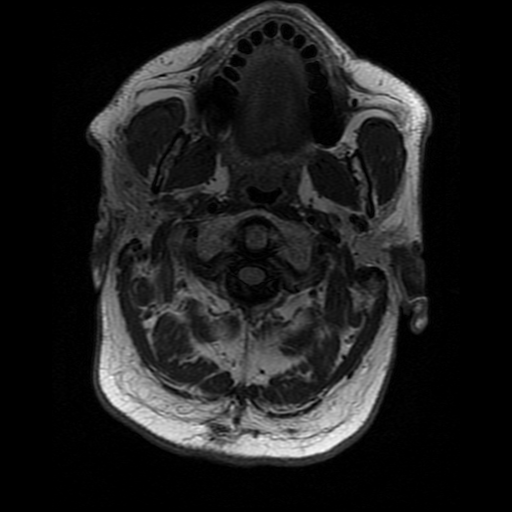
[im 29/58]
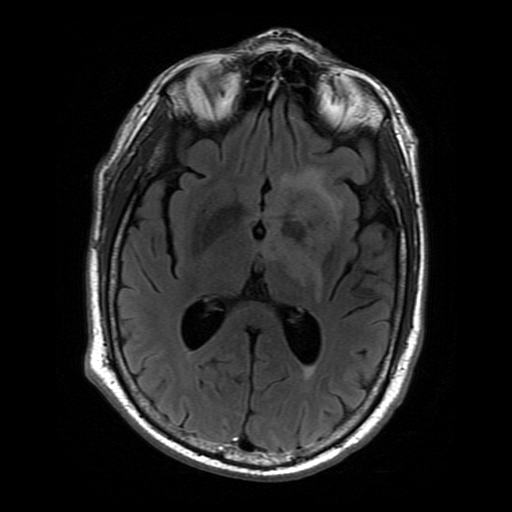
[im 58/58]
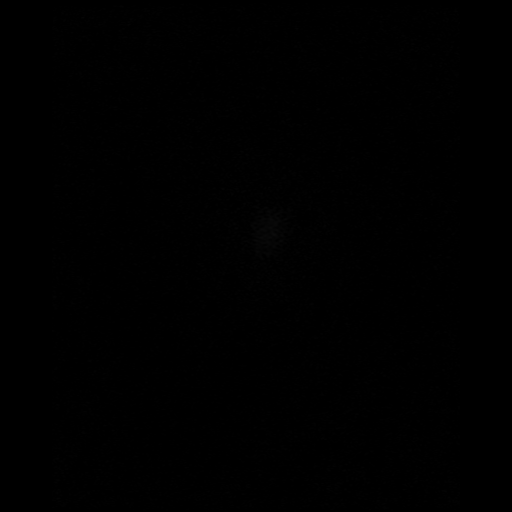

[Series 7: SWI · axial · 3.0mm · 0.47mm/px · 1 of 116 slices shown]
[im 1/116]
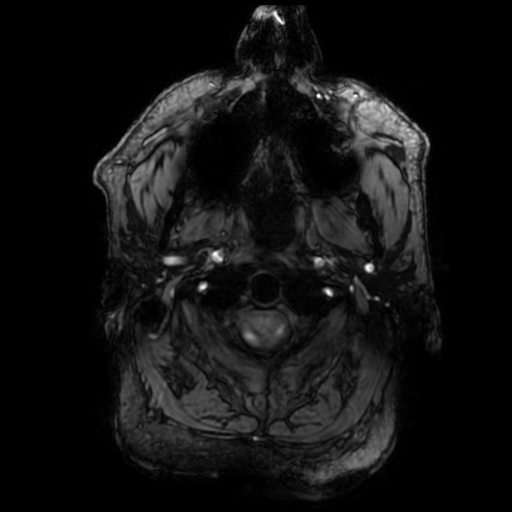

[Series 10: T2 post-contrast · coronal · 3.0mm · 0.39mm/px · 3 of 58 slices shown]
[im 1/58]
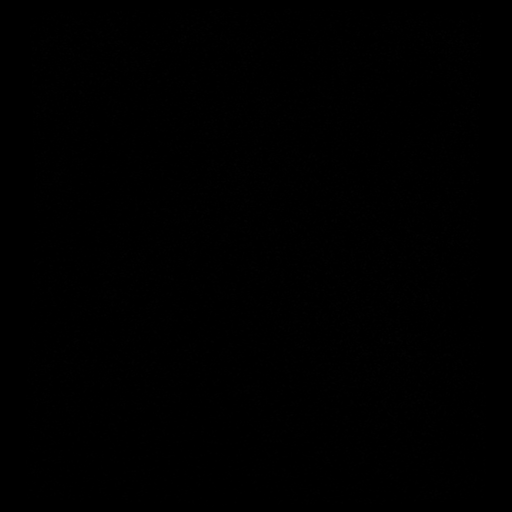
[im 29/58]
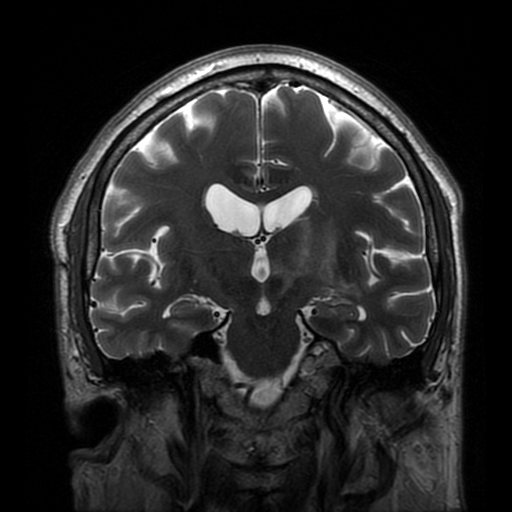
[im 58/58]
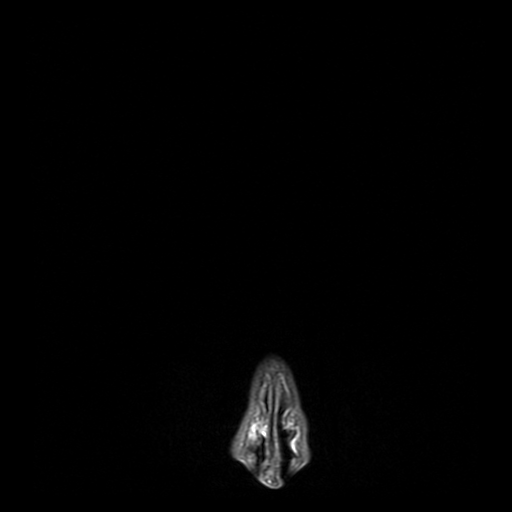

[Series 11: T1 post-contrast · coronal · 3.0mm · 0.39mm/px · 3 of 57 slices shown]
[im 1/57]
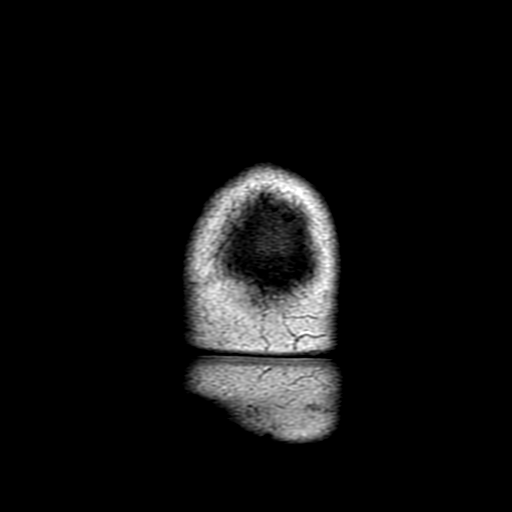
[im 29/57]
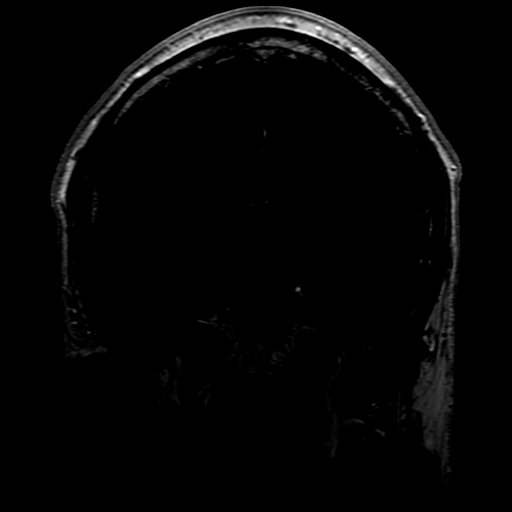
[im 57/57]
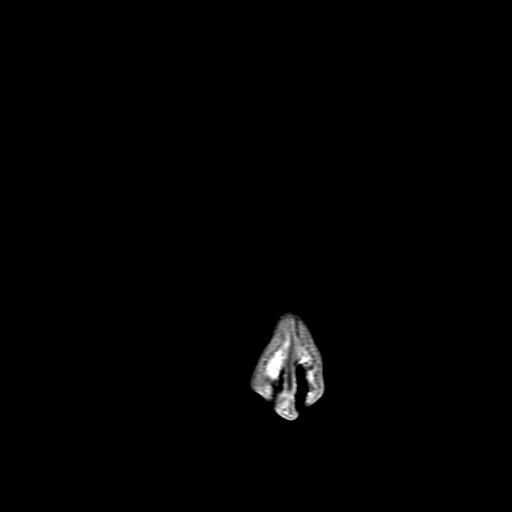

[Series 13: FLAIR post-contrast · sagittal · 3.0mm · 0.47mm/px · 2 of 43 slices shown]
[im 1/43]
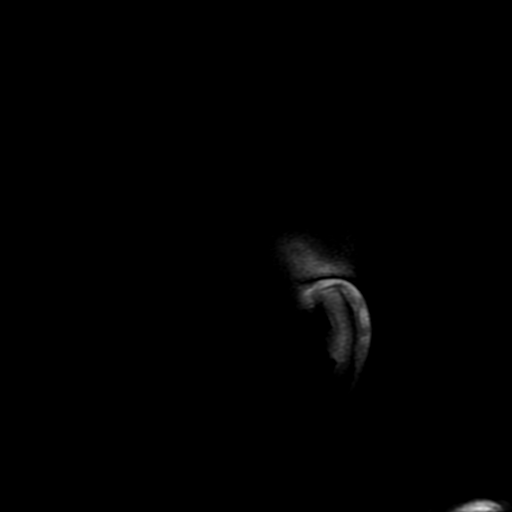
[im 43/43]
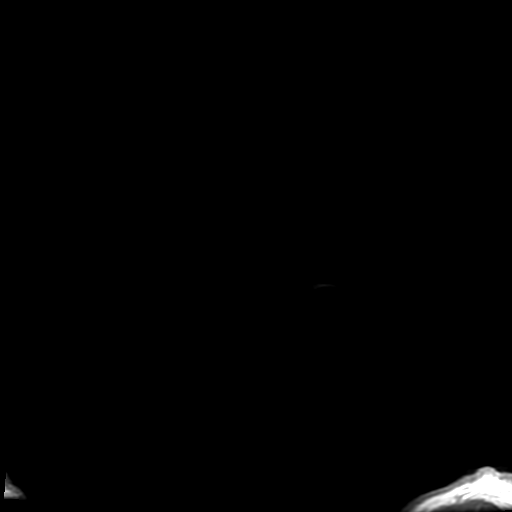

[Series 350: ADC · axial · 3.0mm · 0.94mm/px · z∈[-23,+143]mm · 3 of 58 slices shown]
[im 1/58]
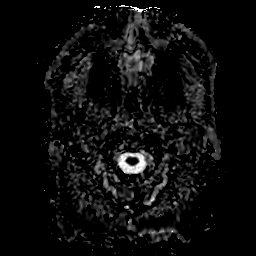
[im 29/58]
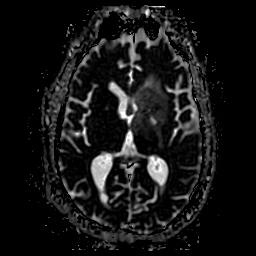
[im 58/58]
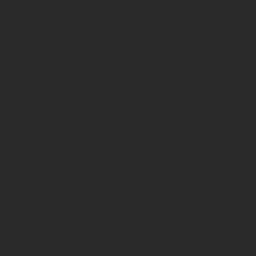

[26 of 48 positions shown; findings below may reference images not displayed]

FINDINGS: Brain: The brainstem and cerebellum are normal. There is an
infiltrating mass lesion centered at the base of the brain on the
left with involvement of the basal ganglia and radiating white
matter tracts. The lesion is difficult to measure precisely but is
approximately 4.5 cm in diameter. There are low level blood products
resulting in some susceptibility artifact in the central portion.
Few small cystic areas without large areas of necrosis. Tumor may
cross the midline into the right hemisphere by the anterior
commissure. This is a minimal finding. The enhancement pattern shows
patchy enhancement, more towards the central portion. Findings most
worrisome for glioblastoma. Mass effect upon the left lateral
ventricle. No midline shift. No obstructive hydrocephalus. No
extra-axial collection.

Vascular: Major vessels at the base of the brain show flow.

Skull and upper cervical spine: Negative

Sinuses/Orbits: Clear/normal

Other: None
IMPRESSION: Approximately 4.5 cm infiltrating mass centered at the base of the
brain on the left with involvement of the basal ganglia and
radiating white matter tracts. Low level blood products. Mass effect
upon the left lateral ventricle but no midline shift. Question
crossing through the anterior commissure to the right hemisphere, a
minimal finding if present. Findings most worrisome for
glioblastoma.

## 2020-03-30 MED ORDER — GADOBUTROL 1 MMOL/ML IV SOLN
8.0000 mL | Freq: Once | INTRAVENOUS | Status: AC | PRN
  Administered 2020-03-30: 8 mL via INTRAVENOUS

## 2020-03-31 ENCOUNTER — Other Ambulatory Visit: Payer: Self-pay | Admitting: Neurological Surgery

## 2020-03-31 ENCOUNTER — Telehealth: Payer: Self-pay | Admitting: *Deleted

## 2020-03-31 NOTE — Telephone Encounter (Signed)
   Primary Cardiologist: Dorris Carnes, MD  Chart reviewed as part of pre-operative protocol coverage. Patient was contacted 03/31/2020 in reference to pre-operative risk assessment for pending surgery as outlined below.  Cory Mathis was last seen on 03/08/20 by Dr. Harrington Challenger.  Since that day, Cory Mathis has done well. He does not have a history of MI or stroke. He can complete more than 4.0 METS without angina.   Per our clinical pharmacist: Patient with diagnosis of afib on Xarelto for anticoagulation.    Procedure: LEFT STEREOTACTIC BRAIN Bx Date of procedure: 04/06/20 :[id number]}  CHA2DS2-VASc Score = 3  This indicates a 3.2% annual risk of stroke. The patient's score is based upon: CHF History: 0 HTN History: 1 Diabetes History: 0 Stroke History: 0 Vascular Disease History: 1 Age Score: 1 Gender Score: 0   CrCl 38mL/min Platelet count 316K  Per office protocol, patient can hold Xarelto for 3 days prior to procedure. If an extra day or two hold is recommended by MD performing procedure, would be acceptable from cardiovascular risk perspective.   Therefore, based on ACC/AHA guidelines, the patient would be at acceptable risk for the planned procedure without further cardiovascular testing.   The patient was advised that if he develops new symptoms prior to surgery to contact our office to arrange for a follow-up visit, and he verbalized understanding.  I will route this recommendation to the requesting party via Epic fax function and remove from pre-op pool. Please call with questions.  Tami Lin Chevez Sambrano, PA 03/31/2020, 1:24 PM

## 2020-03-31 NOTE — Telephone Encounter (Signed)
   [facility] Pre-operative Risk Assessment    HEARTCARE STAFF: - Please ensure there is not already an duplicate clearance open for this procedure. - Under Visit Info/Reason for Call, type in Other and utilize the format Clearance MM/DD/YY or Clearance TBD. Do not use dashes or single digits. - If request is for dental extraction, please clarify the # of teeth to be extracted.  Request for surgical clearance:  1. What type of surgery is being performed? LEFT STEREOTACTIC BRAIN Bx   2. When is this surgery scheduled? 04/06/20   3. What type of clearance is required (medical clearance vs. Pharmacy clearance to hold med vs. Both)? BOTH  4. Are there any medications that need to be held prior to surgery and how long? [place]   5. Practice name and name of physician performing surgery? [facility]; DR. Marcello Moores OSTERGARD   6. What is the office phone number? [phone]   7.   What is the office fax number? [place]: JESSICA  8.   Anesthesia type (None, local, MAC, general) ? GENERAL   Julaine Hua 03/31/2020, 11:04 AM  _________________________________________________________________   (provider comments below)

## 2020-03-31 NOTE — Telephone Encounter (Signed)
Patient with diagnosis of afib on Xarelto for anticoagulation.    Procedure: LEFT STEREOTACTIC BRAIN Bx  Date of procedure: 04/06/20 :[id number]}  CHA2DS2-VASc Score = 3  This indicates a 3.2% annual risk of stroke. The patient's score is based upon: CHF History: 0 HTN History: 1 Diabetes History: 0 Stroke History: 0 Vascular Disease History: 1 Age Score: 1 Gender Score: 0   CrCl 84mL/min Platelet count 316K  Per office protocol, patient can hold Xarelto for 3 days prior to procedure. If an extra day or two hold is recommended by MD performing procedure, would be acceptable from cardiovascular risk perspective.

## 2020-04-03 ENCOUNTER — Other Ambulatory Visit (HOSPITAL_COMMUNITY)
Admission: RE | Admit: 2020-04-03 | Discharge: 2020-04-03 | Disposition: A | Discharge status: Home / Self Care | Payer: Medicare Other | Source: Ambulatory Visit | Attending: Neurological Surgery | Admitting: Neurological Surgery

## 2020-04-03 DIAGNOSIS — Z01812 Encounter for preprocedural laboratory examination: Secondary | ICD-10-CM | POA: Insufficient documentation

## 2020-04-03 DIAGNOSIS — Z20822 Contact with and (suspected) exposure to covid-19: Secondary | ICD-10-CM | POA: Insufficient documentation

## 2020-04-04 ENCOUNTER — Other Ambulatory Visit: Payer: Self-pay

## 2020-04-04 ENCOUNTER — Encounter (HOSPITAL_COMMUNITY): Payer: Self-pay | Admitting: Neurological Surgery

## 2020-04-04 LAB — SARS CORONAVIRUS 2 (TAT 6-24 HRS): SARS Coronavirus 2: NEGATIVE

## 2020-04-06 ENCOUNTER — Inpatient Hospital Stay (HOSPITAL_COMMUNITY)
Admission: RE | Admit: 2020-04-06 | Discharge: 2020-04-07 | DRG: 027 | Disposition: A | Discharge status: Home / Self Care | Payer: Medicare Other | Attending: Neurological Surgery | Admitting: Neurological Surgery

## 2020-04-06 ENCOUNTER — Inpatient Hospital Stay (HOSPITAL_COMMUNITY): Payer: Medicare Other | Admitting: Certified Registered Nurse Anesthetist

## 2020-04-06 ENCOUNTER — Encounter (HOSPITAL_COMMUNITY)
Admission: RE | Disposition: A | Discharge status: Home / Self Care | Payer: Self-pay | Source: Home / Self Care | Attending: Neurological Surgery

## 2020-04-06 ENCOUNTER — Encounter (HOSPITAL_COMMUNITY): Payer: Self-pay | Admitting: Neurological Surgery

## 2020-04-06 ENCOUNTER — Inpatient Hospital Stay (HOSPITAL_COMMUNITY): Payer: Medicare Other

## 2020-04-06 ENCOUNTER — Other Ambulatory Visit: Payer: Self-pay

## 2020-04-06 DIAGNOSIS — Z87891 Personal history of nicotine dependence: Secondary | ICD-10-CM | POA: Diagnosis not present

## 2020-04-06 DIAGNOSIS — J3489 Other specified disorders of nose and nasal sinuses: Secondary | ICD-10-CM | POA: Diagnosis not present

## 2020-04-06 DIAGNOSIS — K219 Gastro-esophageal reflux disease without esophagitis: Secondary | ICD-10-CM | POA: Diagnosis present

## 2020-04-06 DIAGNOSIS — D496 Neoplasm of unspecified behavior of brain: Principal | ICD-10-CM | POA: Diagnosis present

## 2020-04-06 DIAGNOSIS — Z9889 Other specified postprocedural states: Secondary | ICD-10-CM | POA: Diagnosis not present

## 2020-04-06 DIAGNOSIS — Z86010 Personal history of colonic polyps: Secondary | ICD-10-CM | POA: Diagnosis not present

## 2020-04-06 DIAGNOSIS — G9389 Other specified disorders of brain: Secondary | ICD-10-CM | POA: Diagnosis not present

## 2020-04-06 DIAGNOSIS — G939 Disorder of brain, unspecified: Secondary | ICD-10-CM | POA: Diagnosis not present

## 2020-04-06 DIAGNOSIS — G936 Cerebral edema: Secondary | ICD-10-CM | POA: Diagnosis not present

## 2020-04-06 DIAGNOSIS — E890 Postprocedural hypothyroidism: Secondary | ICD-10-CM | POA: Diagnosis present

## 2020-04-06 DIAGNOSIS — Z20822 Contact with and (suspected) exposure to covid-19: Secondary | ICD-10-CM | POA: Diagnosis present

## 2020-04-06 DIAGNOSIS — N400 Enlarged prostate without lower urinary tract symptoms: Secondary | ICD-10-CM | POA: Diagnosis not present

## 2020-04-06 DIAGNOSIS — I2510 Atherosclerotic heart disease of native coronary artery without angina pectoris: Secondary | ICD-10-CM | POA: Diagnosis present

## 2020-04-06 DIAGNOSIS — I619 Nontraumatic intracerebral hemorrhage, unspecified: Secondary | ICD-10-CM | POA: Diagnosis not present

## 2020-04-06 DIAGNOSIS — I10 Essential (primary) hypertension: Secondary | ICD-10-CM | POA: Diagnosis present

## 2020-04-06 DIAGNOSIS — G4733 Obstructive sleep apnea (adult) (pediatric): Secondary | ICD-10-CM | POA: Diagnosis not present

## 2020-04-06 DIAGNOSIS — I616 Nontraumatic intracerebral hemorrhage, multiple localized: Secondary | ICD-10-CM | POA: Diagnosis not present

## 2020-04-06 HISTORY — PX: FRAMELESS  BIOPSY WITH BRAINLAB: SHX6879

## 2020-04-06 HISTORY — PX: APPLICATION OF CRANIAL NAVIGATION: SHX6578

## 2020-04-06 HISTORY — DX: Essential (primary) hypertension: I10

## 2020-04-06 LAB — CBC
HCT: 38.9 % — ABNORMAL LOW (ref 39.0–52.0)
Hemoglobin: 13.5 g/dL (ref 13.0–17.0)
MCH: 32.3 pg (ref 26.0–34.0)
MCHC: 34.7 g/dL (ref 30.0–36.0)
MCV: 93.1 fL (ref 80.0–100.0)
Platelets: 280 10*3/uL (ref 150–400)
RBC: 4.18 MIL/uL — ABNORMAL LOW (ref 4.22–5.81)
RDW: 11.9 % (ref 11.5–15.5)
WBC: 8.1 10*3/uL (ref 4.0–10.5)
nRBC: 0 % (ref 0.0–0.2)

## 2020-04-06 LAB — BASIC METABOLIC PANEL
Anion gap: 7 (ref 5–15)
BUN: 7 mg/dL — ABNORMAL LOW (ref 8–23)
CO2: 24 mmol/L (ref 22–32)
Calcium: 9.2 mg/dL (ref 8.9–10.3)
Chloride: 107 mmol/L (ref 98–111)
Creatinine, Ser: 0.9 mg/dL (ref 0.61–1.24)
GFR, Estimated: >60 mL/min (ref >60)
Glucose, Bld: 100 mg/dL — ABNORMAL HIGH (ref 70–99)
Potassium: 3.9 mmol/L (ref 3.5–5.1)
Sodium: 138 mmol/L (ref 135–145)

## 2020-04-06 LAB — ABO/RH: ABO/RH(D): A POS

## 2020-04-06 LAB — MRSA PCR SCREENING: MRSA by PCR: NEGATIVE

## 2020-04-06 LAB — TYPE AND SCREEN
ABO/RH(D): A POS
Antibody Screen: NEGATIVE

## 2020-04-06 IMAGING — CT CT HEAD W/O CM
3 series · 15 of 47 positions shown, 18 images · non-contrast
Comparison: [DATE]

CLINICAL DATA: Status post brain biopsy

EXAM:
CT HEAD WITHOUT CONTRAST
TECHNIQUE: Contiguous axial images were obtained from the base of the skull
through the vertex without intravenous contrast.

[Series 3: head 5.0 h30s · axial · 0.45mm/px · z∈[-100,+35]mm · 9 of 33 slices shown, 12 images]
[im 3/33  brain]
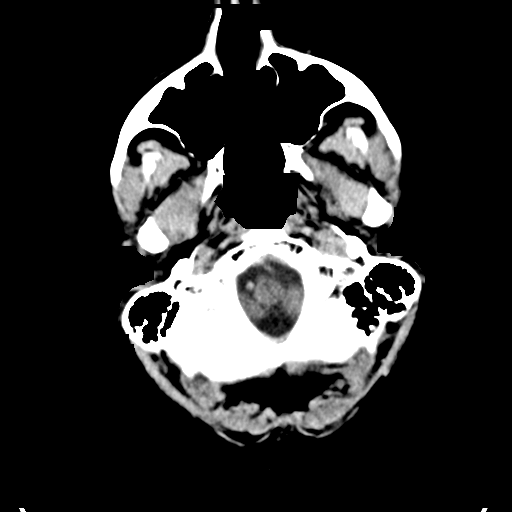
[im 3/33  bone]
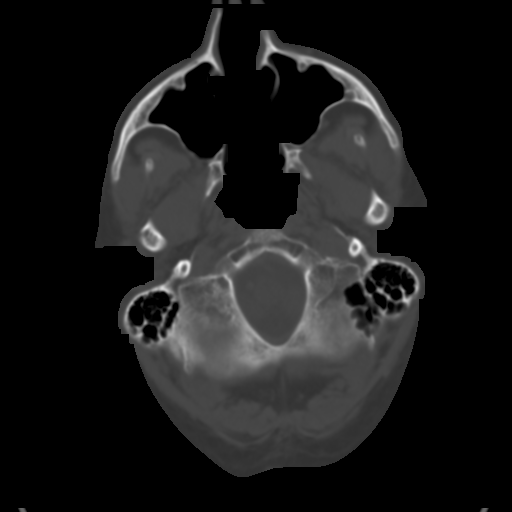
[im 6/33  brain]
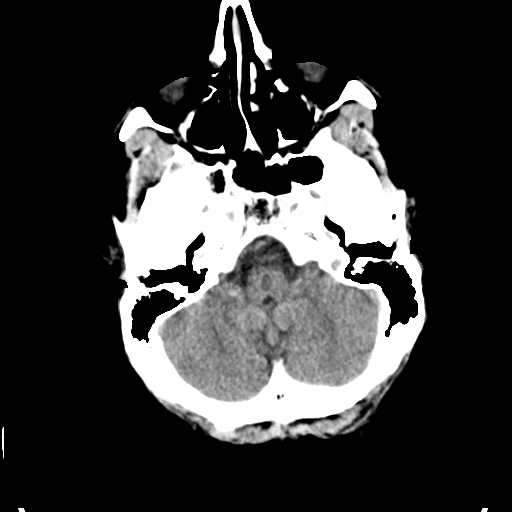
[im 9/33  brain]
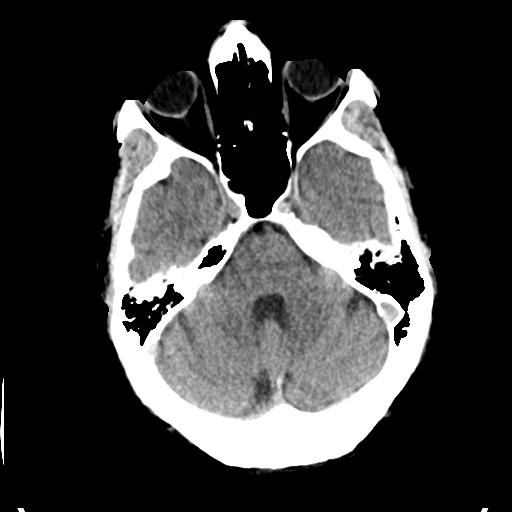
[im 13/33  brain]
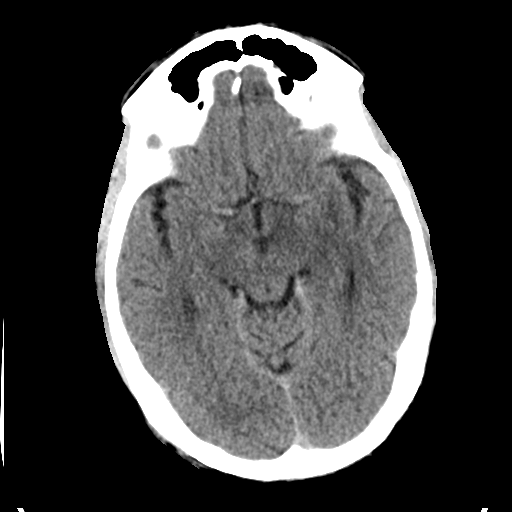
[im 17/33  brain]
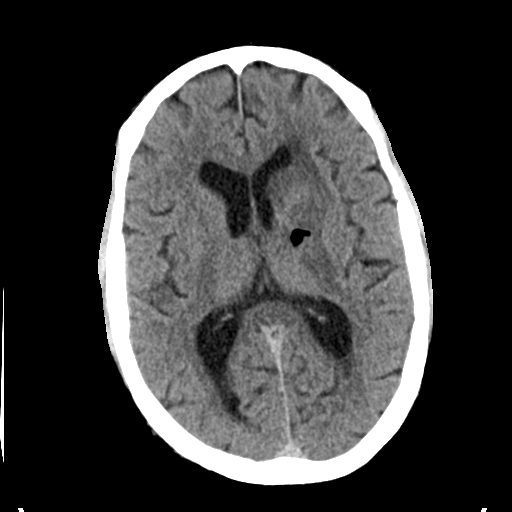
[im 17/33  bone]
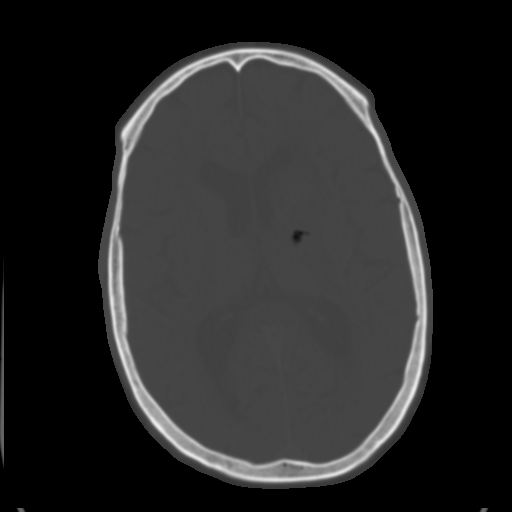
[im 20/33  brain]
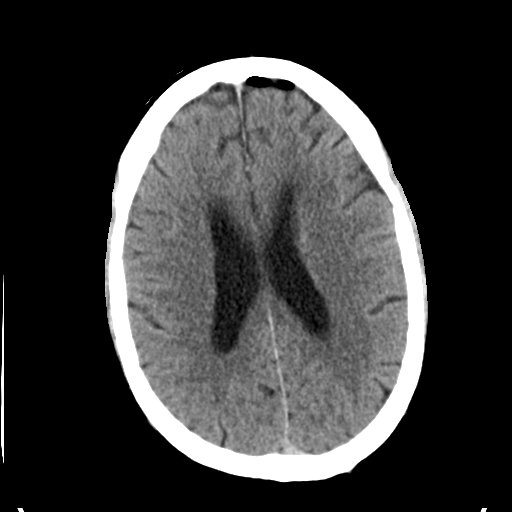
[im 24/33  brain]
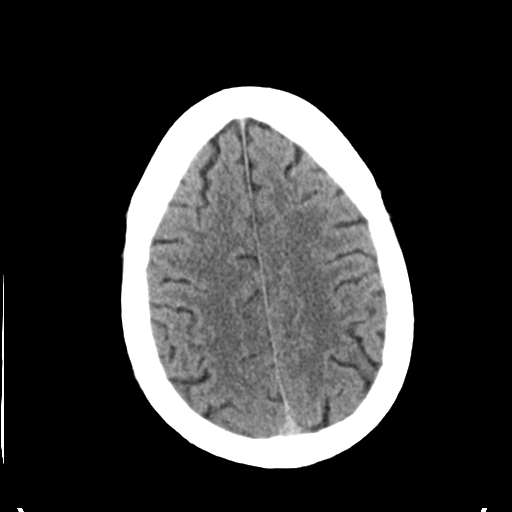
[im 27/33  brain]
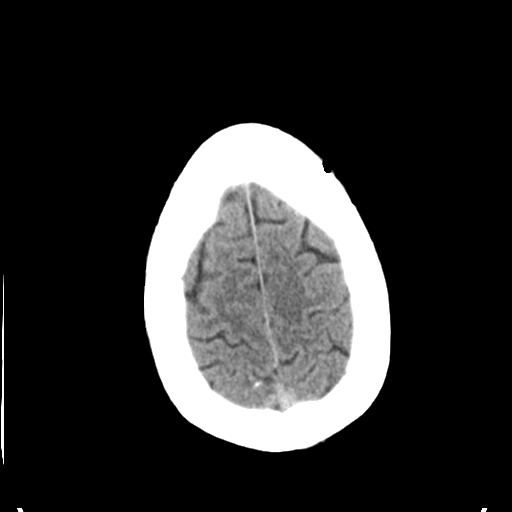
[im 30/33  brain]
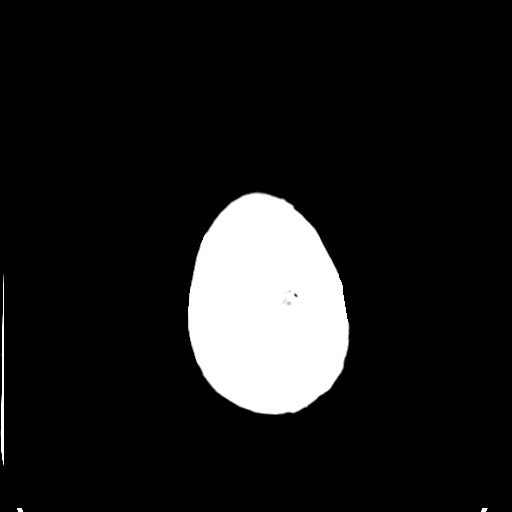
[im 30/33  bone]
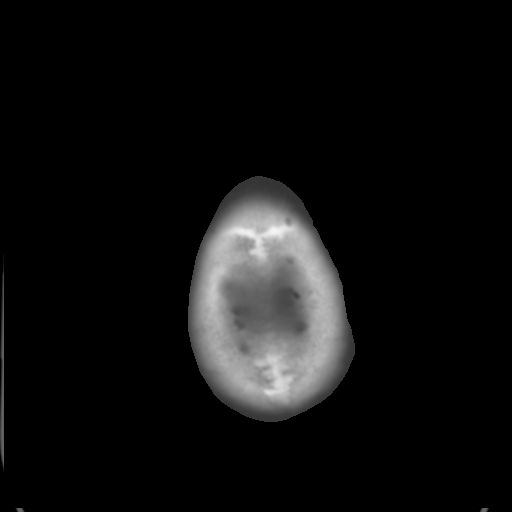

[Series 5: head 3.0 mpr cor · coronal · 0.32mm/px · 3 of 71 slices shown]
[im 24/71  brain]
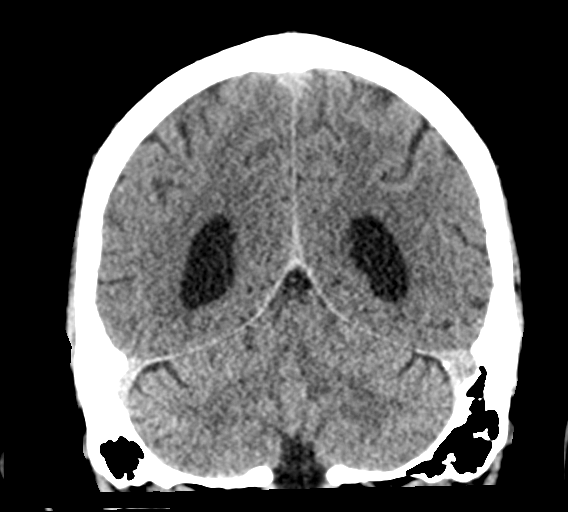
[im 32/71  brain]
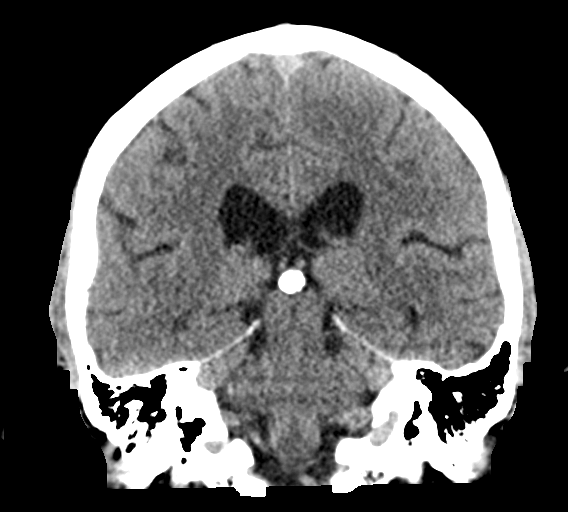
[im 39/71  brain]
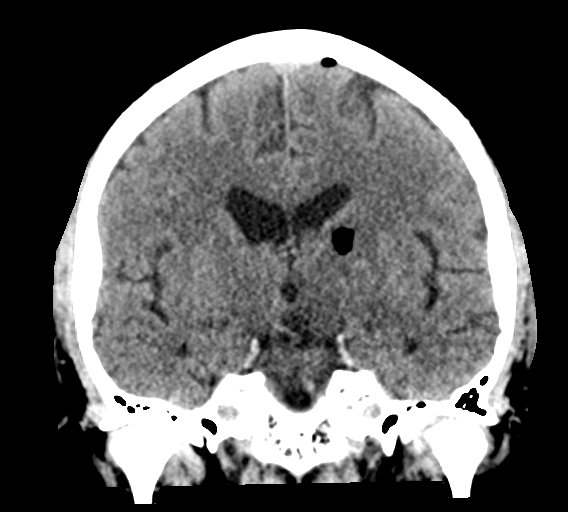

[Series 6: head 3.0 mpr sag · sagittal · 0.31mm/px · 3 of 58 slices shown]
[im 20/58  brain]
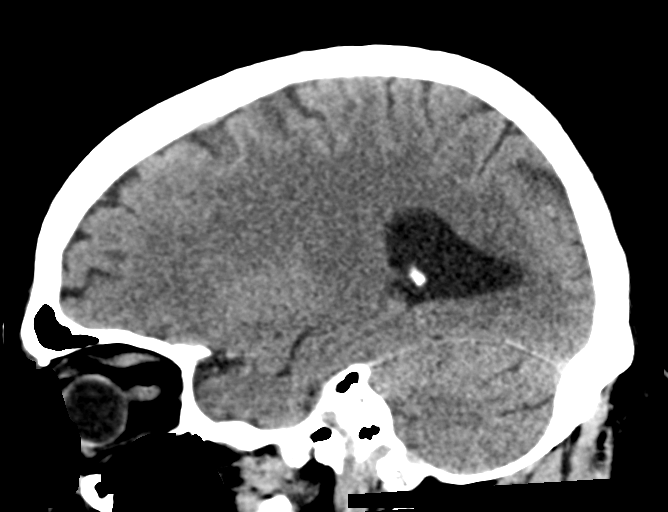
[im 29/58  brain]
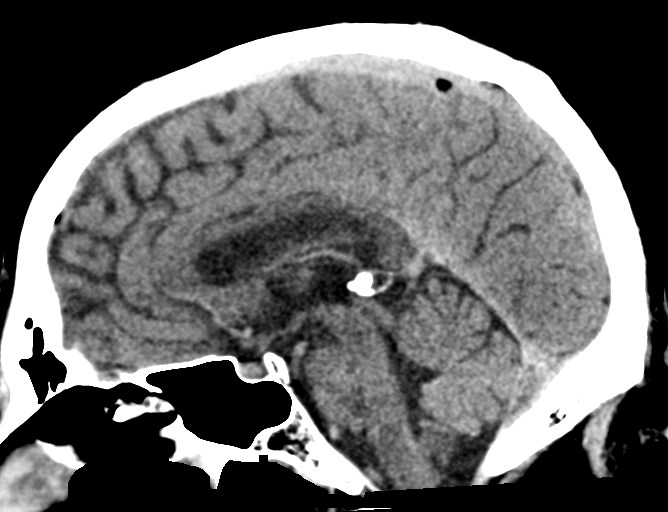
[im 39/58  brain]
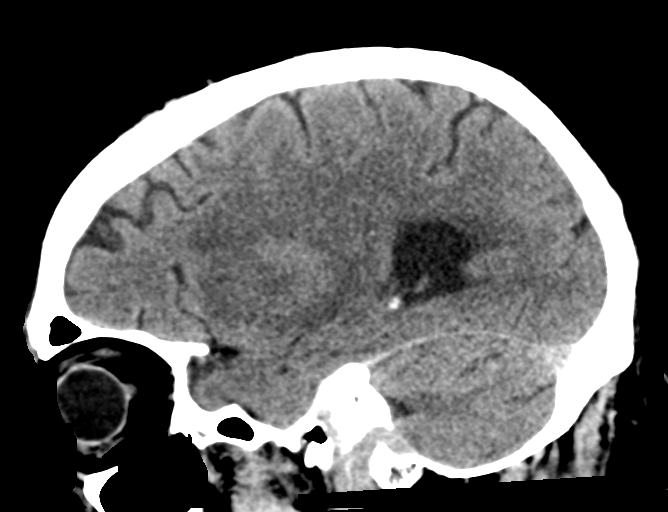

[15 of 47 positions shown; findings below may reference images not displayed]

FINDINGS: Brain: Small amount of air and hemorrhage at left basal ganglia
biopsy site. Mild surrounding edema. No midline shift. New focus of
hypodensity within the lower pons.

Vascular: No hyperdense vessel or unexpected calcification.

Skull: Normal. Negative for fracture or focal lesion.

Sinuses/Orbits: No acute finding.

Other: None.
IMPRESSION: 1. Small amount of air and hemorrhage at left basal ganglia biopsy
site.
2. New focus of hypodensity within the lower pons, concerning for
acute or subacute infarct.

## 2020-04-06 SURGERY — FRAMELESS BIOPSY WITH BRAINLAB
Anesthesia: General

## 2020-04-06 MED ORDER — PROPOFOL 10 MG/ML IV BOLUS
INTRAVENOUS | Status: DC | PRN
  Administered 2020-04-06: 200 mg via INTRAVENOUS

## 2020-04-06 MED ORDER — HEPARIN SODIUM (PORCINE) 5000 UNIT/ML IJ SOLN: 5000.0000 [IU] | Freq: Three times a day (TID) | INTRAMUSCULAR | Status: DC

## 2020-04-06 MED ORDER — CHLORHEXIDINE GLUCONATE CLOTH 2 % EX PADS: 6.0000 | MEDICATED_PAD | Freq: Every day | CUTANEOUS | Status: DC

## 2020-04-06 MED ORDER — ROSUVASTATIN CALCIUM 5 MG PO TABS
2.5000 mg | ORAL_TABLET | Freq: Every day | ORAL | Status: DC
  Administered 2020-04-06: 2.5 mg via ORAL
  Filled 2020-04-06 (×2): qty 1

## 2020-04-06 MED ORDER — AMISULPRIDE (ANTIEMETIC) 5 MG/2ML IV SOLN: 10.0000 mg | Freq: Once | INTRAVENOUS | Status: DC | PRN

## 2020-04-06 MED ORDER — CEFAZOLIN SODIUM-DEXTROSE 2-4 GM/100ML-% IV SOLN
2.0000 g | INTRAVENOUS | Status: AC
  Administered 2020-04-06: 2 g via INTRAVENOUS
  Filled 2020-04-06: qty 100

## 2020-04-06 MED ORDER — CHLORHEXIDINE GLUCONATE 0.12 % MT SOLN: 15.0000 mL | Freq: Once | OROMUCOSAL | Status: AC

## 2020-04-06 MED ORDER — FENTANYL CITRATE (PF) 100 MCG/2ML IJ SOLN
INTRAMUSCULAR | Status: AC
  Filled 2020-04-06: qty 2

## 2020-04-06 MED ORDER — CHLORHEXIDINE GLUCONATE 0.12 % MT SOLN
OROMUCOSAL | Status: AC
  Administered 2020-04-06: 15 mL via OROMUCOSAL
  Filled 2020-04-06: qty 15

## 2020-04-06 MED ORDER — DEXAMETHASONE SODIUM PHOSPHATE 10 MG/ML IJ SOLN
INTRAMUSCULAR | Status: DC | PRN
  Administered 2020-04-06: 10 mg via INTRAVENOUS

## 2020-04-06 MED ORDER — ONDANSETRON HCL 4 MG/2ML IJ SOLN: 4.0000 mg | INTRAMUSCULAR | Status: DC | PRN

## 2020-04-06 MED ORDER — FENTANYL CITRATE (PF) 100 MCG/2ML IJ SOLN
25.0000 ug | INTRAMUSCULAR | Status: DC | PRN
  Administered 2020-04-06 (×2): 50 ug via INTRAVENOUS

## 2020-04-06 MED ORDER — ROCURONIUM BROMIDE 10 MG/ML (PF) SYRINGE
PREFILLED_SYRINGE | INTRAVENOUS | Status: DC | PRN
  Administered 2020-04-06: 40 mg via INTRAVENOUS
  Administered 2020-04-06: 60 mg via INTRAVENOUS

## 2020-04-06 MED ORDER — HYDROMORPHONE HCL 1 MG/ML IJ SOLN: 0.5000 mg | INTRAMUSCULAR | Status: DC | PRN

## 2020-04-06 MED ORDER — LIDOCAINE-EPINEPHRINE 1 %-1:100000 IJ SOLN
INTRAMUSCULAR | Status: AC
  Filled 2020-04-06: qty 1

## 2020-04-06 MED ORDER — FENTANYL CITRATE (PF) 250 MCG/5ML IJ SOLN
INTRAMUSCULAR | Status: AC
  Filled 2020-04-06: qty 5

## 2020-04-06 MED ORDER — MIDAZOLAM HCL 2 MG/2ML IJ SOLN
INTRAMUSCULAR | Status: AC
  Filled 2020-04-06: qty 2

## 2020-04-06 MED ORDER — ORAL CARE MOUTH RINSE: 15.0000 mL | Freq: Once | OROMUCOSAL | Status: AC

## 2020-04-06 MED ORDER — DIFLUPREDNATE 0.05 % OP EMUL: 1.0000 [drp] | Freq: Two times a day (BID) | OPHTHALMIC | Status: DC

## 2020-04-06 MED ORDER — POLYETHYLENE GLYCOL 3350 17 G PO PACK: 17.0000 g | PACK | Freq: Every day | ORAL | Status: DC | PRN

## 2020-04-06 MED ORDER — AMLODIPINE BESYLATE 5 MG PO TABS
5.0000 mg | ORAL_TABLET | Freq: Every day | ORAL | Status: DC
  Administered 2020-04-07: 5 mg via ORAL
  Filled 2020-04-06: qty 1

## 2020-04-06 MED ORDER — PROMETHAZINE HCL 25 MG PO TABS: 12.5000 mg | ORAL_TABLET | ORAL | Status: DC | PRN

## 2020-04-06 MED ORDER — PROPOFOL 10 MG/ML IV BOLUS
INTRAVENOUS | Status: AC
  Filled 2020-04-06: qty 40

## 2020-04-06 MED ORDER — ONDANSETRON HCL 4 MG/2ML IJ SOLN
INTRAMUSCULAR | Status: DC | PRN
  Administered 2020-04-06: 4 mg via INTRAVENOUS

## 2020-04-06 MED ORDER — SUGAMMADEX SODIUM 200 MG/2ML IV SOLN
INTRAVENOUS | Status: DC | PRN
  Administered 2020-04-06: 400 mg via INTRAVENOUS

## 2020-04-06 MED ORDER — ONDANSETRON HCL 4 MG PO TABS: 4.0000 mg | ORAL_TABLET | ORAL | Status: DC | PRN

## 2020-04-06 MED ORDER — PHENYLEPHRINE HCL-NACL 10-0.9 MG/250ML-% IV SOLN
INTRAVENOUS | Status: DC | PRN
  Administered 2020-04-06: 20 ug/min via INTRAVENOUS

## 2020-04-06 MED ORDER — ACETAMINOPHEN 650 MG RE SUPP: 650.0000 mg | RECTAL | Status: DC | PRN

## 2020-04-06 MED ORDER — LIDOCAINE 2% (20 MG/ML) 5 ML SYRINGE
INTRAMUSCULAR | Status: DC | PRN
  Administered 2020-04-06: 60 mg via INTRAVENOUS

## 2020-04-06 MED ORDER — ACETAMINOPHEN 325 MG PO TABS
650.0000 mg | ORAL_TABLET | ORAL | Status: DC | PRN
  Administered 2020-04-06 – 2020-04-07 (×2): 650 mg via ORAL
  Filled 2020-04-06 (×2): qty 2

## 2020-04-06 MED ORDER — BACITRACIN ZINC 500 UNIT/GM EX OINT
TOPICAL_OINTMENT | CUTANEOUS | Status: DC | PRN
  Administered 2020-04-06 (×2): 1 via TOPICAL

## 2020-04-06 MED ORDER — DEXAMETHASONE SODIUM PHOSPHATE 10 MG/ML IJ SOLN
INTRAMUSCULAR | Status: AC
  Filled 2020-04-06: qty 1

## 2020-04-06 MED ORDER — LIDOCAINE-EPINEPHRINE 1 %-1:100000 IJ SOLN
INTRAMUSCULAR | Status: DC | PRN
  Administered 2020-04-06: 2 mL

## 2020-04-06 MED ORDER — ONDANSETRON HCL 4 MG/2ML IJ SOLN
INTRAMUSCULAR | Status: AC
  Filled 2020-04-06: qty 2

## 2020-04-06 MED ORDER — THROMBIN 5000 UNITS EX SOLR
OROMUCOSAL | Status: DC | PRN
  Administered 2020-04-06: 5 mL via TOPICAL

## 2020-04-06 MED ORDER — ZOLPIDEM TARTRATE 5 MG PO TABS
5.0000 mg | ORAL_TABLET | Freq: Every evening | ORAL | Status: DC | PRN
  Administered 2020-04-06: 5 mg via ORAL
  Filled 2020-04-06: qty 1

## 2020-04-06 MED ORDER — CHLORHEXIDINE GLUCONATE CLOTH 2 % EX PADS: 6.0000 | MEDICATED_PAD | Freq: Once | CUTANEOUS | Status: DC

## 2020-04-06 MED ORDER — THROMBIN 5000 UNITS EX SOLR
CUTANEOUS | Status: AC
  Filled 2020-04-06: qty 5000

## 2020-04-06 MED ORDER — HYDROCODONE-ACETAMINOPHEN 5-325 MG PO TABS: 1.0000 | ORAL_TABLET | ORAL | Status: DC | PRN

## 2020-04-06 MED ORDER — FENTANYL CITRATE (PF) 250 MCG/5ML IJ SOLN
INTRAMUSCULAR | Status: DC | PRN
Start: 2020-04-06 — End: 2020-04-06
  Administered 2020-04-06 (×2): 100 ug via INTRAVENOUS

## 2020-04-06 MED ORDER — 0.9 % SODIUM CHLORIDE (POUR BTL) OPTIME
TOPICAL | Status: DC | PRN
  Administered 2020-04-06: 2000 mL

## 2020-04-06 MED ORDER — LACTATED RINGERS IV SOLN: INTRAVENOUS | Status: DC

## 2020-04-06 MED ORDER — LABETALOL HCL 5 MG/ML IV SOLN: 10.0000 mg | INTRAVENOUS | Status: DC | PRN

## 2020-04-06 MED ORDER — BACITRACIN ZINC 500 UNIT/GM EX OINT
TOPICAL_OINTMENT | CUTANEOUS | Status: AC
  Filled 2020-04-06: qty 28.35

## 2020-04-06 MED ORDER — DOCUSATE SODIUM 100 MG PO CAPS
100.0000 mg | ORAL_CAPSULE | Freq: Two times a day (BID) | ORAL | Status: DC
  Filled 2020-04-06: qty 1

## 2020-04-06 SURGICAL SUPPLY — 91 items
BAND RUBBER #18 3X1/16 STRL (MISCELLANEOUS) IMPLANT
BENZOIN TINCTURE PRP APPL 2/3 (GAUZE/BANDAGES/DRESSINGS) IMPLANT
BLADE CLIPPER SURG (BLADE) ×3 IMPLANT
BLADE SAW GIGLI 16 STRL (MISCELLANEOUS) IMPLANT
BLADE SURG 15 STRL LF DISP TIS (BLADE) IMPLANT
BLADE SURG 15 STRL SS (BLADE)
BNDG GAUZE ELAST 4 BULKY (GAUZE/BANDAGES/DRESSINGS) IMPLANT
BNDG STRETCH 4X75 STRL LF (GAUZE/BANDAGES/DRESSINGS) IMPLANT
BUR ACORN 9.0 PRECISION (BURR) ×3 IMPLANT
BUR ROUND FLUTED 4 SOFT TCH (BURR) IMPLANT
BUR SPIRAL ROUTER 2.3 (BUR) ×3 IMPLANT
CANISTER SUCT 3000ML PPV (MISCELLANEOUS) ×6 IMPLANT
CATH VENTRIC 35X38 W/TROCAR LG (CATHETERS) IMPLANT
CLIP VESOCCLUDE MED 6/CT (CLIP) IMPLANT
CNTNR URN SCR LID CUP LEK RST (MISCELLANEOUS) ×4 IMPLANT
CONT SPEC 4OZ STRL OR WHT (MISCELLANEOUS) ×2
COVER MAYO STAND STRL (DRAPES) IMPLANT
COVER WAND RF STERILE (DRAPES) IMPLANT
DECANTER SPIKE VIAL GLASS SM (MISCELLANEOUS) ×3 IMPLANT
DRAIN SUBARACHNOID (WOUND CARE) IMPLANT
DRAPE HALF SHEET 40X57 (DRAPES) ×3 IMPLANT
DRAPE MICROSCOPE LEICA (MISCELLANEOUS) IMPLANT
DRAPE NEUROLOGICAL W/INCISE (DRAPES) ×3 IMPLANT
DRAPE SHEET LG 3/4 BI-LAMINATE (DRAPES) ×3 IMPLANT
DRAPE STERI IOBAN 125X83 (DRAPES) IMPLANT
DRAPE SURG 17X23 STRL (DRAPES) IMPLANT
DRAPE WARM FLUID 44X44 (DRAPES) ×3 IMPLANT
DRSG ADAPTIC 3X8 NADH LF (GAUZE/BANDAGES/DRESSINGS) IMPLANT
DRSG OPSITE 4X5.5 SM (GAUZE/BANDAGES/DRESSINGS) ×3 IMPLANT
DRSG TELFA 3X8 NADH (GAUZE/BANDAGES/DRESSINGS) ×3 IMPLANT
DURAPREP 6ML APPLICATOR 50/CS (WOUND CARE) ×3 IMPLANT
ELECT REM PT RETURN 9FT ADLT (ELECTROSURGICAL) ×3
ELECTRODE REM PT RTRN 9FT ADLT (ELECTROSURGICAL) ×2 IMPLANT
EVACUATOR 1/8 PVC DRAIN (DRAIN) IMPLANT
EVACUATOR SILICONE 100CC (DRAIN) IMPLANT
FORCEPS BIPO MALIS IRRIG 9X1.5 (NEUROSURGERY SUPPLIES) IMPLANT
GAUZE 4X4 16PLY RFD (DISPOSABLE) IMPLANT
GAUZE SPONGE 4X4 12PLY STRL (GAUZE/BANDAGES/DRESSINGS) IMPLANT
GLOVE BIO SURGEON STRL SZ 6.5 (GLOVE) ×6 IMPLANT
GLOVE BIO SURGEON STRL SZ7.5 (GLOVE) ×3 IMPLANT
GLOVE BIOGEL PI IND STRL 6.5 (GLOVE) ×4 IMPLANT
GLOVE BIOGEL PI IND STRL 7.5 (GLOVE) ×2 IMPLANT
GLOVE BIOGEL PI INDICATOR 6.5 (GLOVE) ×2
GLOVE BIOGEL PI INDICATOR 7.5 (GLOVE) ×1
GLOVE EXAM NITRILE LRG STRL (GLOVE) IMPLANT
GLOVE EXAM NITRILE XL STR (GLOVE) IMPLANT
GLOVE EXAM NITRILE XS STR PU (GLOVE) IMPLANT
GOWN STRL REUS W/ TWL LRG LVL3 (GOWN DISPOSABLE) ×4 IMPLANT
GOWN STRL REUS W/ TWL XL LVL3 (GOWN DISPOSABLE) IMPLANT
GOWN STRL REUS W/TWL 2XL LVL3 (GOWN DISPOSABLE) IMPLANT
GOWN STRL REUS W/TWL LRG LVL3 (GOWN DISPOSABLE) ×2
GOWN STRL REUS W/TWL XL LVL3 (GOWN DISPOSABLE)
HEMOSTAT POWDER KIT SURGIFOAM (HEMOSTASIS) ×3 IMPLANT
HEMOSTAT SURGICEL 2X14 (HEMOSTASIS) IMPLANT
HOOK DURA 1/2IN (MISCELLANEOUS) IMPLANT
IV NS 1000ML (IV SOLUTION) ×1
IV NS 1000ML BAXH (IV SOLUTION) ×2 IMPLANT
KIT BASIN OR (CUSTOM PROCEDURE TRAY) ×3 IMPLANT
KIT DRAIN CSF ACCUDRAIN (MISCELLANEOUS) IMPLANT
KIT NEEDLE BIOPSY 1.8X235 CRAN (NEEDLE) ×3 IMPLANT
KIT TURNOVER KIT B (KITS) ×3 IMPLANT
KIT VARIOGUIDE DRILL 1.9 (KITS) ×3 IMPLANT
MARKER SKIN DUAL TIP RULER LAB (MISCELLANEOUS) ×3 IMPLANT
MARKER SPHERE PSV REFLC 13MM (MARKER) ×9 IMPLANT
NEEDLE HYPO 22GX1.5 SAFETY (NEEDLE) ×3 IMPLANT
NEEDLE SPNL 18GX3.5 QUINCKE PK (NEEDLE) IMPLANT
NS IRRIG 1000ML POUR BTL (IV SOLUTION) ×6 IMPLANT
PACK CRANIOTOMY CUSTOM (CUSTOM PROCEDURE TRAY) ×3 IMPLANT
PATTIES SURGICAL .25X.25 (GAUZE/BANDAGES/DRESSINGS) IMPLANT
PATTIES SURGICAL .5 X.5 (GAUZE/BANDAGES/DRESSINGS) IMPLANT
PATTIES SURGICAL .5 X3 (DISPOSABLE) IMPLANT
PATTIES SURGICAL 1/4 X 3 (GAUZE/BANDAGES/DRESSINGS) IMPLANT
PATTIES SURGICAL 1X1 (DISPOSABLE) IMPLANT
PIN MAYFIELD SKULL DISP (PIN) ×3 IMPLANT
SPECIMEN JAR SMALL (MISCELLANEOUS) IMPLANT
SPONGE NEURO XRAY DETECT 1X3 (DISPOSABLE) IMPLANT
SPONGE SURGIFOAM ABS GEL 100 (HEMOSTASIS) IMPLANT
STAPLER VISISTAT 35W (STAPLE) ×3 IMPLANT
SUT ETHILON 3 0 FSL (SUTURE) IMPLANT
SUT ETHILON 3 0 PS 1 (SUTURE) IMPLANT
SUT MNCRL AB 3-0 PS2 18 (SUTURE) ×3 IMPLANT
SUT NURALON 4 0 TR CR/8 (SUTURE) IMPLANT
SUT SILK 0 TIES 10X30 (SUTURE) IMPLANT
SUT VIC AB 2-0 CP2 18 (SUTURE) IMPLANT
SYR CONTROL 10ML LL (SYRINGE) ×3 IMPLANT
TOWEL GREEN STERILE (TOWEL DISPOSABLE) ×3 IMPLANT
TOWEL GREEN STERILE FF (TOWEL DISPOSABLE) ×3 IMPLANT
TRAY FOLEY MTR SLVR 16FR STAT (SET/KITS/TRAYS/PACK) ×3 IMPLANT
TUBE CONNECTING 12X1/4 (SUCTIONS) ×3 IMPLANT
UNDERPAD 30X36 HEAVY ABSORB (UNDERPADS AND DIAPERS) ×3 IMPLANT
WATER STERILE IRR 1000ML POUR (IV SOLUTION) ×3 IMPLANT

## 2020-04-06 NOTE — Progress Notes (Signed)
Pt has CT trip planned and not going on CPAP as planned.  I set it up in his room with his nasal mask.  Pt states he can place it on himself when he is ready for bed.  I advised if he has any issues to have RN call RT.

## 2020-04-06 NOTE — Progress Notes (Signed)
Visit made to patients room to deliver CPAP.  Spoke with patient about when to set up and administer.  He said around 2230.  I advised patient I would be back to set up and place him on CPAP as close to 2230 as possible.

## 2020-04-06 NOTE — Op Note (Signed)
PATIENT: Cory Mathis  DAY OF SURGERY: 04/06/20   PRE-OPERATIVE DIAGNOSIS:  Left sided brain tumor   POST-OPERATIVE DIAGNOSIS:  Left sided brain tumor   PROCEDURE:  Left stereotactic brain biopsy   SURGEON:  Surgeon(s) and Role:    Judith Part, MD - Primary    Osie Cheeks NP - Assist   ANESTHESIA: ETGA   BRIEF HISTORY: This is a 69  year old man who presented with headaches and mild cognitive changes. The patient was found to have an enhancing mass in the deep structures of his left hemisphere. I therefore recommended a stereotactic biopsy to guide treatment. This was discussed with the patient as well as risks, benefits, and alternatives and wished to proceed with surgery.   OPERATIVE DETAIL: The patient was taken to the operating room and placed on the OR table in the supine position. A formal time out was performed with two patient identifiers and confirmed the operative site. Anesthesia was induced by the anesthesia team. The [place] head holder was applied to the head and a registration array was attached to the [place]. This was co-registered with the patient's preoperative imaging, the fit appeared to be acceptable.   Preoperatively, I planned a trajectory on the brainlab system to target the tumor, avoid venous structures, and have the cutting window span the lesion. This was then transferred to the Brainlab intraoperative navigation system.  Using frameless stereotaxy, the operative trajectory was planned and the incision was marked. Hair was clipped with surgical clippers over the incision and the area was then prepped and draped in a sterile fashion.  The varioguide was attached, registered, and trajectory locked in. A small incision was made and the CD4 drill was used with a reducing tube to create a small burr hole along the trajectory. The dura was pierced with the dural piercing instrument, the reducing tube was switched and the image-guided Brainlab needle was  advanced to the pre-determined depth along the trajectory, monitoring the location along the pathway with image guidance. The cutting window was opened and biopsy samples were taken. An air bubble was placed in the biopsy cavity to correlate with post-operative imaging. The biopsy tract was irrigated until clear irrigation returned, the needle was removed, and the incision was irrigated copiously. The biopsy specimen was sent to pathology for analysis.  All instrument and sponge counts were correct, the incision was then closed in layers. The patient was then returned to anesthesia for emergence. No apparent complications at the completion of the procedure.   EBL:  48mL   DRAINS: none   SPECIMENS: Left brain tumor  Judith Part, MD 04/06/20 10:28 AM

## 2020-04-06 NOTE — Anesthesia Procedure Notes (Signed)
Arterial Line Insertion Start/End10/26/2021 8:31 AM, 04/06/2020 8:31 AM Performed by: Renato Shin, CRNA, CRNA  Patient location: Pre-op. Preanesthetic checklist: patient identified, IV checked, site marked, risks and benefits discussed, surgical consent, monitors and equipment checked, pre-op evaluation, timeout performed and anesthesia consent Lidocaine 1% used for infiltration Left, radial was placed Catheter size: 20 G Hand hygiene performed , maximum sterile barriers used  and Seldinger technique used Allen's test indicative of satisfactory collateral circulation Attempts: 1 Procedure performed without using ultrasound guided technique. Following insertion, dressing applied and Biopatch. Post procedure assessment: normal  Patient tolerated the procedure well with no immediate complications.

## 2020-04-06 NOTE — Anesthesia Preprocedure Evaluation (Signed)
Anesthesia Evaluation  Patient identified by MRN, date of birth, ID band Patient awake    Reviewed: Allergy & Precautions, NPO status , Patient's Chart, lab work & pertinent test results  Airway Mallampati: II  TM Distance: >3 FB Neck ROM: Full    Dental  (+) Dental Advisory Given   Pulmonary sleep apnea , former smoker,    breath sounds clear to auscultation       Cardiovascular hypertension, Pt. on medications + CAD  + dysrhythmias Atrial Fibrillation  Rhythm:Regular Rate:Normal     Neuro/Psych negative neurological ROS     GI/Hepatic Neg liver ROS, GERD  ,  Endo/Other  negative endocrine ROS  Renal/GU negative Renal ROS     Musculoskeletal   Abdominal   Peds  Hematology negative hematology ROS (+)   Anesthesia Other Findings   Reproductive/Obstetrics                              Lab Results  Component Value Date   WBC 8.8 08/22/2019   HGB 14.1 08/22/2019   HCT 42.3 08/22/2019   MCV 96 08/22/2019   PLT 316 08/22/2019   Lab Results  Component Value Date   CREATININE 0.93 08/22/2019   BUN 10 08/22/2019   NA 139 08/22/2019   K 4.4 08/22/2019   CL 101 08/22/2019   CO2 25 08/22/2019    Anesthesia Physical Anesthesia Plan  ASA: III  Anesthesia Plan: General   Post-op Pain Management:    Induction: Intravenous  PONV Risk Score and Plan: 2 and Dexamethasone, Ondansetron and Treatment may vary due to age or medical condition  Airway Management Planned: Oral ETT  Additional Equipment: Arterial line  Intra-op Plan:   Post-operative Plan: Extubation in OR  Informed Consent: I have reviewed the patients History and Physical, chart, labs and discussed the procedure including the risks, benefits and alternatives for the proposed anesthesia with the patient or authorized representative who has indicated his/her understanding and acceptance.     Dental advisory  given  Plan Discussed with: CRNA  Anesthesia Plan Comments:         Anesthesia Quick Evaluation

## 2020-04-06 NOTE — Anesthesia Postprocedure Evaluation (Signed)
Anesthesia Post Note  Patient: [name]  Procedure(s) Performed: Left Stereotactic brain biopsy with brainlab (Left ) APPLICATION OF CRANIAL NAVIGATION (N/A )     Patient location during evaluation: PACU Anesthesia Type: General Level of consciousness: awake and alert Pain management: pain level controlled Vital Signs Assessment: post-procedure vital signs reviewed and stable Respiratory status: spontaneous breathing, nonlabored ventilation, respiratory function stable and patient connected to nasal cannula oxygen Cardiovascular status: blood pressure returned to baseline and stable Postop Assessment: no apparent nausea or vomiting Anesthetic complications: no   No complications documented.  Last Vitals:  Vitals:   04/06/20 1538 04/06/20 1609  BP: 132/78 130/73  Pulse: 82 83  Resp: 16 19  Temp:    SpO2: 94% 95%    Last Pain:  Vitals:   04/06/20 1609  TempSrc:   PainSc: 0-No pain                 Tiajuana Amass

## 2020-04-06 NOTE — Anesthesia Procedure Notes (Signed)
Procedure Name: Intubation Date/Time: 04/06/2020 9:10 AM Performed by: Myna Bright, CRNA Pre-anesthesia Checklist: Patient identified, Emergency Drugs available, Suction available and Patient being monitored Patient Re-evaluated:Patient Re-evaluated prior to induction Oxygen Delivery Method: Circle system utilized Preoxygenation: Pre-oxygenation with 100% oxygen Induction Type: IV induction Ventilation: Mask ventilation without difficulty Laryngoscope Size: Mac and 4 Grade View: Grade II Tube type: Oral Tube size: 7.5 mm Number of attempts: 1 Airway Equipment and Method: Stylet Placement Confirmation: ETT inserted through vocal cords under direct vision,  positive ETCO2 and breath sounds checked- equal and bilateral Secured at: 22 cm Tube secured with: Tape Dental Injury: Teeth and Oropharynx as per pre-operative assessment

## 2020-04-06 NOTE — Transfer of Care (Signed)
Immediate Anesthesia Transfer of Care Note  Patient: Cory Mathis  Procedure(s) Performed: Left Stereotactic brain biopsy with brainlab (Left ) APPLICATION OF CRANIAL NAVIGATION (N/A )  Patient Location: PACU  Anesthesia Type:General  Level of Consciousness: awake, alert , oriented and patient cooperative  Airway & Oxygen Therapy: Patient Spontanous Breathing and Patient connected to face mask oxygen  Post-op Assessment: Report given to RN, Post -op Vital signs reviewed and stable and Patient moving all extremities  Post vital signs: Reviewed and stable  Last Vitals:  Vitals Value Taken Time  BP 134/61 04/06/20 1038  Temp    Pulse 59 04/06/20 1043  Resp 13 04/06/20 1043  SpO2 100 % 04/06/20 1043  Vitals shown include unvalidated device data.  Last Pain:  Vitals:   04/06/20 0752  TempSrc:   PainSc: 4          Complications: No complications documented.

## 2020-04-06 NOTE — H&P (Signed)
Surgical H&P Update  HPI: 69 y.o. man with headaches and cognitive changes, found to have a left sided enhancing mass, here for stereotactic biopsy. No changes in health since he was last seen. He stopped his rivaroxaban 3d ago. Of note, patient is left handed.  PMHx:  Past Medical History:  Diagnosis Date  . BPH (benign prostatic hyperplasia)   . Diverticulosis of colon   . GERD (gastroesophageal reflux disease)    prn med. control  . History of adenomatous polyp of colon    03-29-2007  tubular adenoma/  08-07-2016  hyperplastic and tubular adenoma's  . History of alcoholic gastritis    [id number]  gastritis, esophagitis, peptic duodenitis  . History of colonic diverticulitis    01-31-2016  resolved w/ medications  . History of Helicobacter pylori infection    10-30-2008  . 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 Hypertension   . Internal hemorrhoids   . Memory loss   . OSA on CPAP    CPAP nightly  . Right thyroid nodule   . Urinary retention 12/13/2016   FamHx:  Family History  Problem Relation Age of Onset  . Heart attack Mother        84's  . Other Father        atherosclerosis - 33's  . Prostate cancer Maternal Grandfather   . Other Sister        brain tumor - unsure if it was cancer - 27's  . Colon cancer Neg Hx   . Esophageal cancer Neg Hx   . Pancreatic cancer Neg Hx   . Rectal cancer Neg Hx   . Stomach cancer Neg Hx    SocHx:  reports that he has quit smoking. He has never used smokeless tobacco. He reports current alcohol use. He reports that he does not use drugs.  Physical Exam: AOx3, PERRL, FS, TM  Strength 5/5 x4, SILTx4, no drift  Assesment/Plan: 69 y.o. man with left deep enhancing mass, here for stereotactic biopsy. Risks, benefits, and alternatives discussed and the patient would like to continue with surgery.  -OR today -4N post-op  Judith Part, MD 04/06/20 8:36 AM

## 2020-04-06 NOTE — Brief Op Note (Signed)
04/06/2020  10:30 AM  PATIENT:  Cory Mathis  69 y.o. male  PRE-OPERATIVE DIAGNOSIS:  brain tumor  POST-OPERATIVE DIAGNOSIS:  brain tumor  PROCEDURE:  Procedure(s): Left Stereotactic brain biopsy with brainlab (Left) APPLICATION OF CRANIAL NAVIGATION (N/A)  SURGEON:  Surgeon(s) and Role:    * Judith Part, MD - Primary  PHYSICIAN ASSISTANT:   ASSISTANTS: Osie Cheeks NP   ANESTHESIA:   general  EBL:  2cc   BLOOD ADMINISTERED:none  DRAINS: none   LOCAL MEDICATIONS USED:  LIDOCAINE   SPECIMEN:  Source of Specimen:  Left brain tumor  DISPOSITION OF SPECIMEN:  PATHOLOGY  COUNTS:  YES  TOURNIQUET:  * No tourniquets in log *  DICTATION: .Note written in EPIC  PLAN OF CARE: Admit to inpatient   PATIENT DISPOSITION:  PACU - hemodynamically stable.   Delay start of Pharmacological VTE agent (>24hrs) due to surgical blood loss or risk of bleeding: yes

## 2020-04-07 ENCOUNTER — Inpatient Hospital Stay (HOSPITAL_COMMUNITY): Payer: Medicare Other

## 2020-04-07 ENCOUNTER — Encounter (HOSPITAL_COMMUNITY): Payer: Self-pay | Admitting: Neurological Surgery

## 2020-04-07 IMAGING — MR MR HEAD W/O CM
6 of 10 series · 29 of 48 positions shown · non-contrast
Comparison: CT head [DATE], MRI [DATE]

CLINICAL DATA: Status post brain biopsy.

EXAM:
MRI HEAD WITHOUT CONTRAST
TECHNIQUE: Multiplanar, multiecho pulse sequences of the brain and surrounding
structures were obtained without intravenous contrast.

[Series 5: DWI · coronal · 4.0mm · 0.94mm/px · 7 of 82 slices shown (1 of 2)]
[im 1/82]
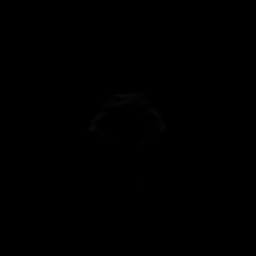
[im 14/82]
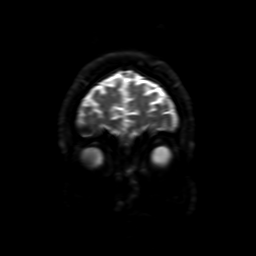
[im 28/82]
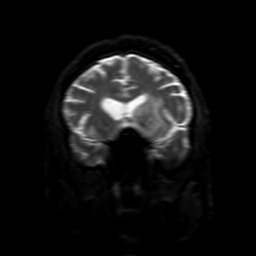
[im 41/82]
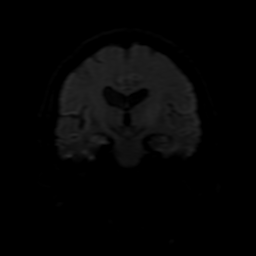
[im 55/82]
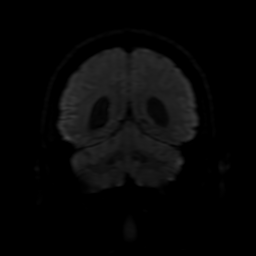
[im 68/82]
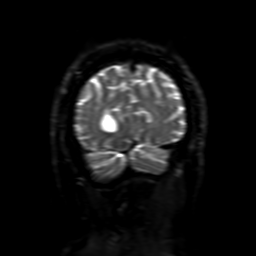
[im 82/82]
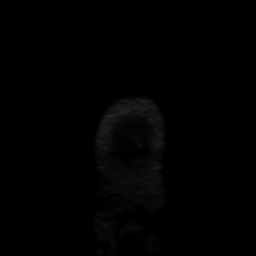

[Series 6: DWI · axial · 3.0mm · 0.94mm/px · z∈[-149,-3]mm · 9 of 102 slices shown (2 of 2)]
[im 1/102]
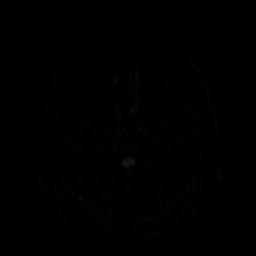
[im 13/102]
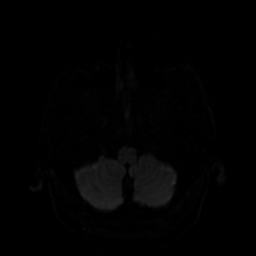
[im 26/102]
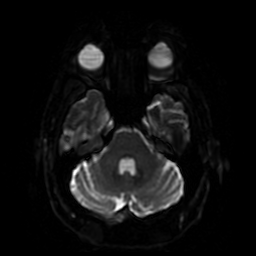
[im 38/102]
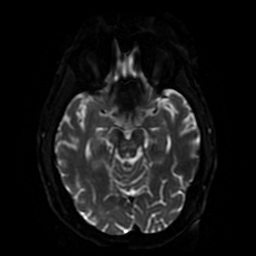
[im 51/102]
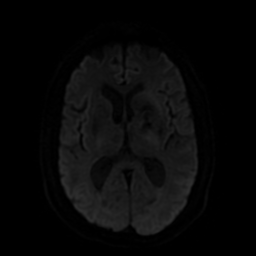
[im 64/102]
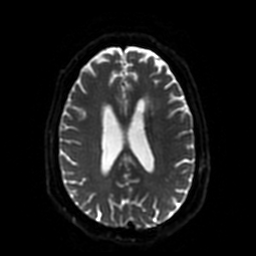
[im 76/102]
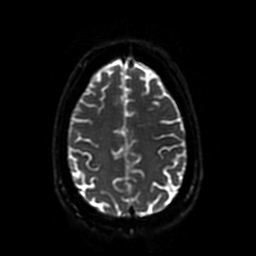
[im 89/102]
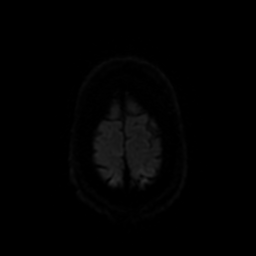
[im 102/102]
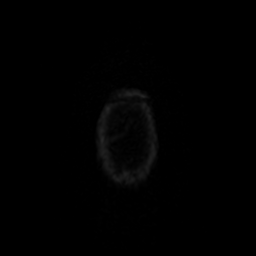

[Series 7: FLAIR · sagittal · 5.0mm · 0.23mm/px · 2 of 25 slices shown (1 of 2)]
[im 1/25]
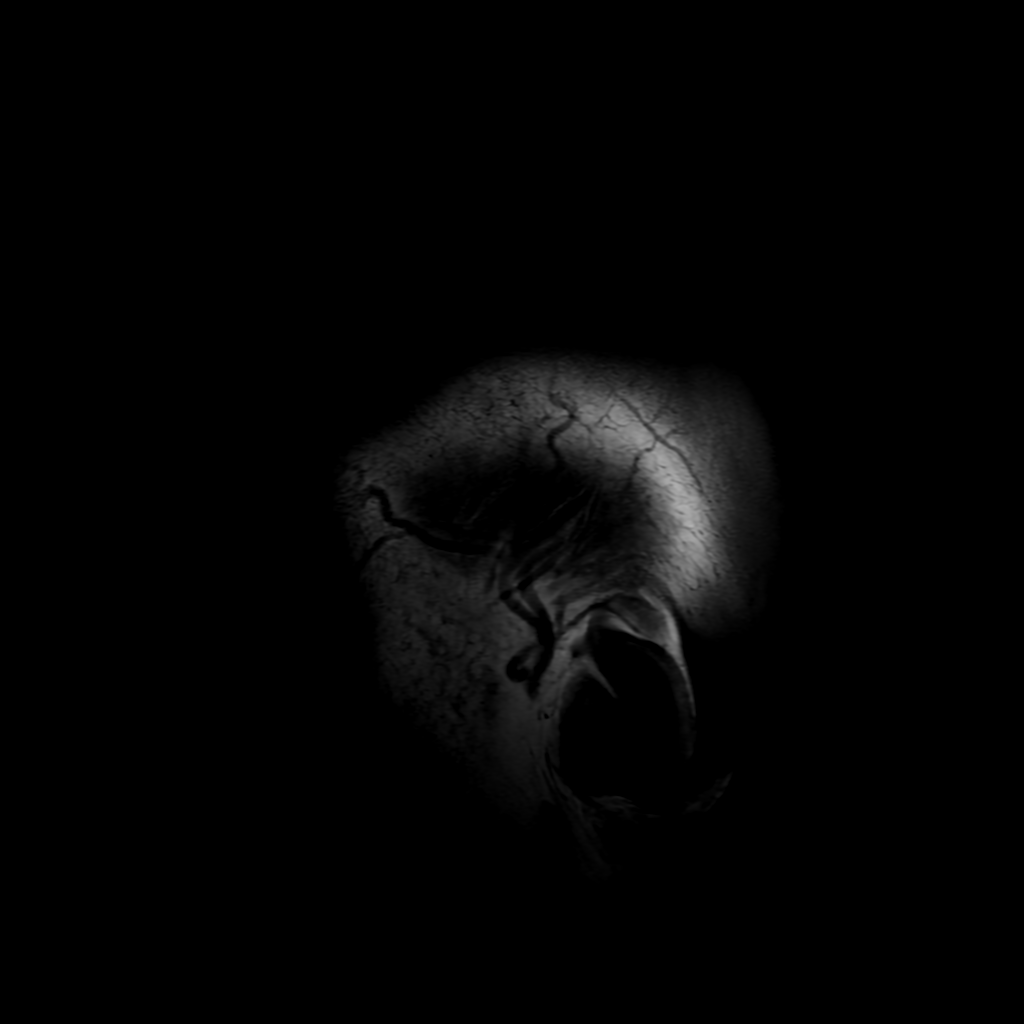
[im 25/25]
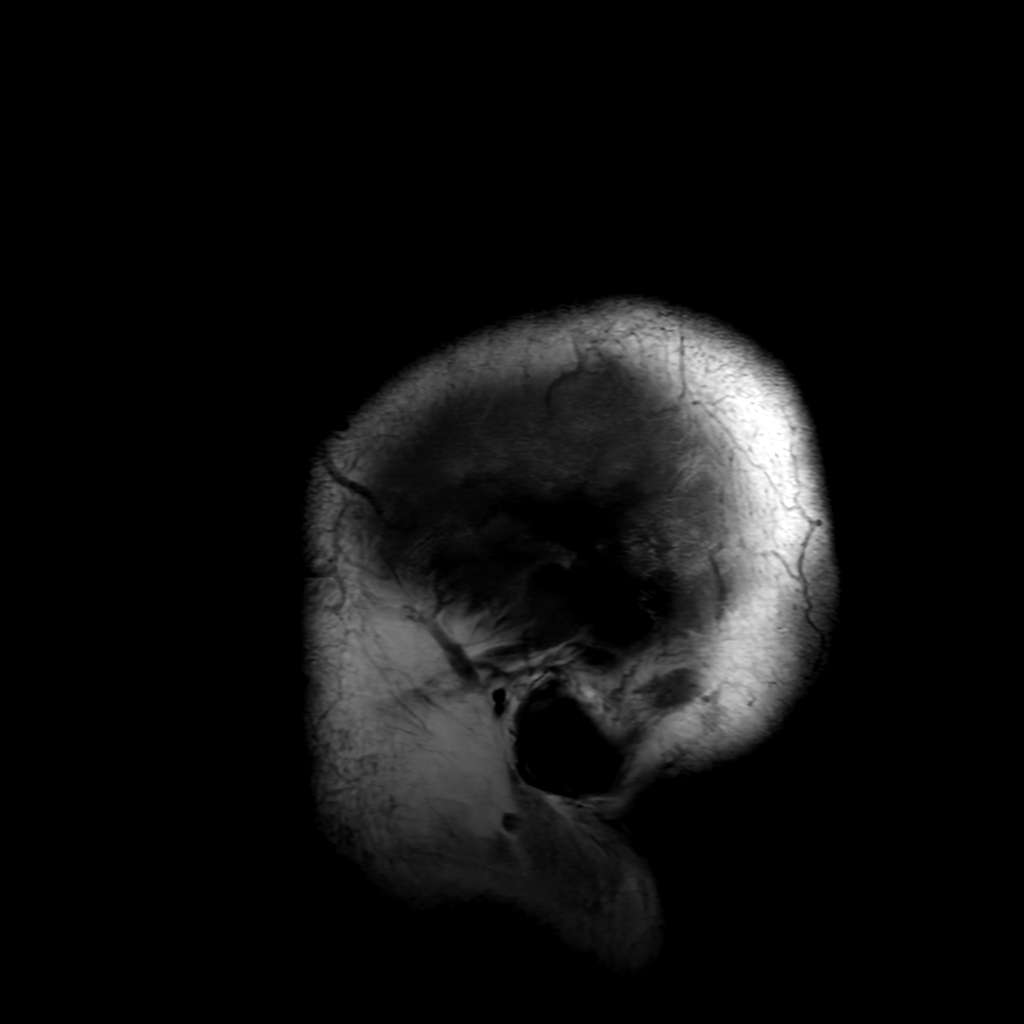

[Series 9: FLAIR · axial · 3.0mm · 0.45mm/px · z∈[-151,-5]mm · 2 of 26 slices shown (2 of 2)]
[im 1/26]
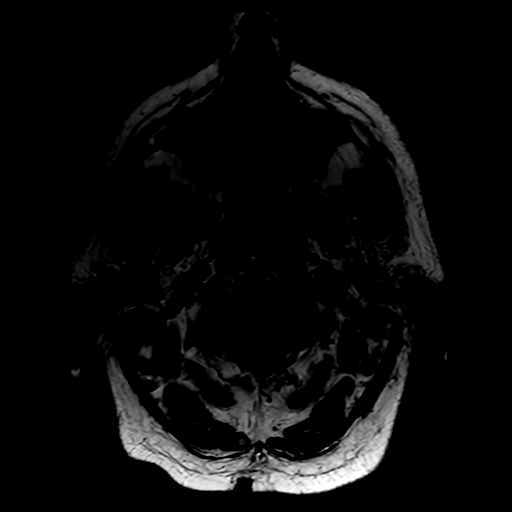
[im 26/26]
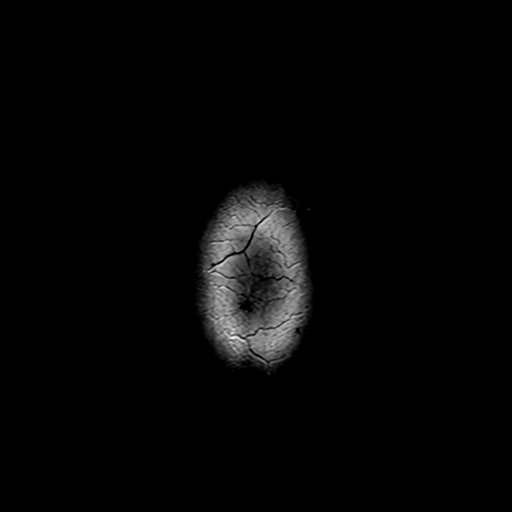

[Series 550: ADC · coronal · 4.0mm · 0.94mm/px · 4 of 41 slices shown (1 of 2)]
[im 1/41]
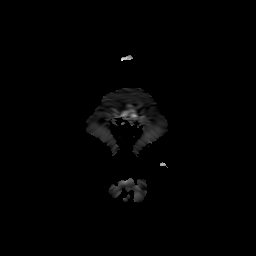
[im 14/41]
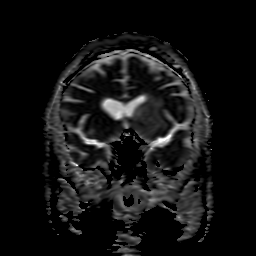
[im 27/41]
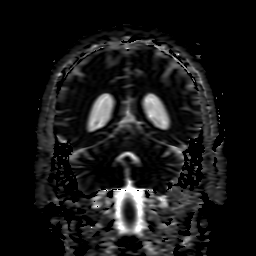
[im 41/41]
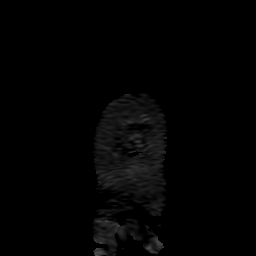

[Series 650: ADC · axial · 3.0mm · 0.94mm/px · z∈[-149,-3]mm · 5 of 51 slices shown (2 of 2)]
[im 1/51]
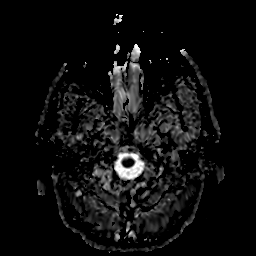
[im 13/51]
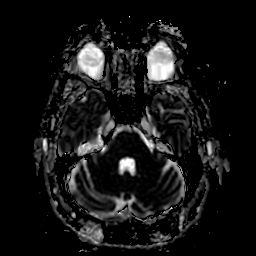
[im 26/51]
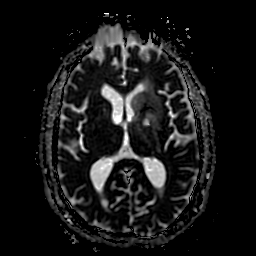
[im 38/51]
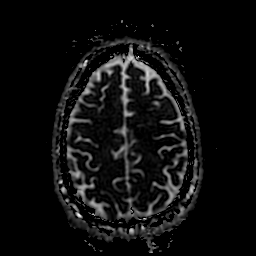
[im 51/51]
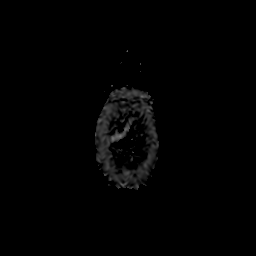

[29 of 48 positions shown; findings below may reference images not displayed]

FINDINGS: Brain: When comparing across modalities, similar appearance of a
small amount of hemorrhage and gas at the site of biopsy in the left
basal ganglia. Small linear biopsy tract through the frontal lobe
with a small amount hemorrhage along the tract. When comparing to
prior MRI, similar appearance of an infiltrating mass lesion
centered at the left basal ganglia with abnormal T2/FLAIR radiating
into surrounding white matter tracks, as detailed on [DATE]. no
acute infarct. No hydrocephalus. No extra-axial fluid collection.

Vascular: Major arterial flow voids are maintained at the skull
base.

Skull and upper cervical spine: Left frontal burr hole from recent
biopsy

Sinuses/Orbits: Mild ethmoid air cell mucosal thickening. No
air-fluid levels. Unremarkable orbits.

Other: None.
IMPRESSION: 1. When comparing across modalities, similar postsurgical changes at
the biopsy site and similar infiltrating mass centered at the left
basal ganglia, as detailed above.
2. No evidence of new acute abnormality.  No acute infarct.

## 2020-04-07 MED ORDER — RIVAROXABAN 20 MG PO TABS
20.0000 mg | ORAL_TABLET | Freq: Every day | ORAL | Status: DC
Start: 2020-04-07 — End: 2020-04-28

## 2020-04-07 NOTE — Progress Notes (Signed)
Neurosurgery Service Progress Note  Subjective: No acute events overnight, no new complaints, headache mild but stable to preop   Objective: Vitals:   04/07/20 0800 04/07/20 0900 04/07/20 1000 04/07/20 1110  BP: (!) 145/72  (!) 129/59 (!) 145/68  Pulse: (!) 59 63 (!) 58 (!) 57  Resp: 13 16 17 14   Temp: 97.6 F (36.4 C)     TempSrc: Oral     SpO2: 95% 97% 97% 96%   Temp (24hrs), Avg:97.9 F (36.6 C), Min:97.6 F (36.4 C), Max:98.7 F (37.1 C)  CBC Latest Ref Rng & Units 04/06/2020 08/22/2019 12/11/2018  WBC 4.0 - 10.5 K/uL 8.1 8.8 9.9  Hemoglobin 13.0 - 17.0 g/dL 13.5 14.1 14.6  Hematocrit 39 - 52 % 38.9(L) 42.3 45.4  Platelets 150 - 400 K/uL 280 316 366   BMP Latest Ref Rng & Units 04/06/2020 08/22/2019 12/11/2018  Glucose 70 - 99 mg/dL 100(H) 91 83  BUN 8 - 23 mg/dL 7(L) 10 13  Creatinine 0.61 - 1.24 mg/dL 0.90 0.93 1.12  BUN/Creat Ratio 10 - 24 - 11 12  Sodium 135 - 145 mmol/L 138 139 134  Potassium 3.5 - 5.1 mmol/L 3.9 4.4 4.5  Chloride 98 - 111 mmol/L 107 101 100  CO2 22 - 32 mmol/L 24 25 25   Calcium 8.9 - 10.3 mg/dL 9.2 9.4 9.6    Intake/Output Summary (Last 24 hours) at 04/07/2020 1120 Last data filed at 04/07/2020 0800 Gross per 24 hour  Intake --  Output 1650 ml  Net -1650 ml    Current Facility-Administered Medications:  .  acetaminophen (TYLENOL) tablet 650 mg, 650 mg, Oral, Q4H PRN, 650 mg at 04/07/20 0935 **OR** acetaminophen (TYLENOL) suppository 650 mg, 650 mg, Rectal, Q4H PRN, Iyani Dresner A, MD .  amLODipine (NORVASC) tablet 5 mg, 5 mg, Oral, Daily, Jocee Kissick, Joyice Faster, MD, 5 mg at 04/07/20 0935 .  Chlorhexidine Gluconate Cloth 2 % PADS 6 each, 6 each, Topical, Daily, Briley Sulton A, MD .  docusate sodium (COLACE) capsule 100 mg, 100 mg, Oral, BID, Bran Aldridge, Joyice Faster, MD .  Derrill Memo ON 04/08/2020] heparin injection 5,000 Units, 5,000 Units, Subcutaneous, Q8H, Simmone Cape, Joyice Faster, MD .  HYDROcodone-acetaminophen (NORCO/VICODIN) 5-325 MG per  tablet 1 tablet, 1 tablet, Oral, Q4H PRN, Judith Part, MD .  HYDROmorphone (DILAUDID) injection 0.5 mg, 0.5 mg, Intravenous, Q3H PRN, Judith Part, MD .  labetalol (NORMODYNE) injection 10-40 mg, 10-40 mg, Intravenous, Q10 min PRN, Briceyda Abdullah A, MD .  ondansetron (ZOFRAN) tablet 4 mg, 4 mg, Oral, Q4H PRN **OR** ondansetron (ZOFRAN) injection 4 mg, 4 mg, Intravenous, Q4H PRN, Oliana Gowens A, MD .  polyethylene glycol (MIRALAX / GLYCOLAX) packet 17 g, 17 g, Oral, Daily PRN, Judith Part, MD .  promethazine (PHENERGAN) tablet 12.5-25 mg, 12.5-25 mg, Oral, Q4H PRN, Judith Part, MD .  rosuvastatin (CRESTOR) tablet 2.5 mg, 2.5 mg, Oral, Daily, Topacio Cella A, MD, 2.5 mg at 04/06/20 1947 .  zolpidem (AMBIEN) tablet 5 mg, 5 mg, Oral, QHS PRN,MR X 1, Clennon Nasca A, MD, 5 mg at 04/06/20 2206   Physical Exam: AOx3, PERRL, EOMI, FS, Strength 5/5 x4, SILTx4, no drift, speech fluent with normal content  Assessment & Plan: 69 y.o. man s/p L Sx Bx, recovering well, exam intact post-op.  -CTH showed post-op changes, small Hb in biopsy cavity but no significant hemorrhage. There was a hypodensity in the pontomedullary junction that appears new. Given the intact exam, I am highly doubtful  that this was indeed a new infarct. But given that he was off his rivaroxaban, got an MRI out of an abundance of caution - this confirmed that it was just artifact, no infarct or abnormality in that area. Okay to discharge home, restart rivaroxaban on POD3.   Joyice Faster Bettye Sitton  04/07/20 11:20 AM

## 2020-04-07 NOTE — Progress Notes (Signed)
Pt d/c with all belongings. Discharge instructions explained and verbalized understanding.

## 2020-04-07 NOTE — Discharge Instructions (Signed)
Discharge Instructions  No restriction in activities, slowly increase your activity back to normal.   Your incision is closed with absorbable sutures. These will naturally fall off over the next 4-6 weeks. If they become bothersome or cause discomfort, apply some antibiotic ointment like bacitracin or neosporin on the sutures. This will soften them up and usually makes them more comfortable while they dissolve.  Okay to shower on the day of discharge. Be gentle when cleaning your incision. Use regular soap and water. If that is uncomfortable, try using baby shampoo. Do not submerge the wound under water for 2 weeks after surgery.  Follow up with Dr. Khali Albanese in 2 weeks after discharge. If you do not already have a discharge appointment, please call his office at [phone] to schedule a follow up appointment. If you have any concerns or questions, please call the office and let us know. 

## 2020-04-07 NOTE — Discharge Summary (Signed)
Discharge Summary  Date of Admission: 04/06/2020  Date of Discharge: 04/07/20  Attending Physician: Emelda Brothers, MD  Hospital Course: Patient was admitted following an uncomplicated left stereotactic brain biopsy. He was recovered in PACU and transferred to the ICU. His post-op CTH showed expected changes with a new hypodensity in the pontomedullary junction. MRI was obtained that showed this was artifactual, as expected given his intact exam post-op. His hospital course was uncomplicated and the patient was discharged home on 04/07/20. He will follow up in clinic with me in 2 weeks and restart his rivaroxaban on 04/09/20.   Neurologic exam at discharge:  AOx3, PERRL, EOMI, FS, TM Strength 5/5 x4, SILTx4, no drift, speech fluent with normal content  Discharge diagnosis: Brain tumor  Judith Part, MD 04/07/20 11:23 AM

## 2020-04-08 ENCOUNTER — Telehealth: Payer: Self-pay | Admitting: Internal Medicine

## 2020-04-08 NOTE — Telephone Encounter (Signed)
Pt c/o medication issue:  1. Name of Medication: rivaroxaban (XARELTO) 20 MG TABS tablet  2. How are you currently taking this medication (dosage and times per day)?  Stopped taking for 6 days due to having procedure, otherwise as directed.   3. Are you having a reaction (difficulty breathing--STAT)? no  4. What is your medication issue?  Supposed to start taking medication again tomorrow, but would like to know if he should start with half a pill or a whole pill.

## 2020-04-08 NOTE — Telephone Encounter (Signed)
Patient had held Xarelto for procedure and is restarting tomorrow.  Wanted to confirm he should restart at whole tablet.  Informed ok to restart 10/29 Xarelto 20 mg tomorrow as per clearance form.

## 2020-04-13 ENCOUNTER — Encounter (HOSPITAL_COMMUNITY): Payer: Self-pay

## 2020-04-14 LAB — SURGICAL PATHOLOGY

## 2020-04-16 ENCOUNTER — Inpatient Hospital Stay: Payer: Medicare Other | Attending: Internal Medicine | Admitting: Internal Medicine

## 2020-04-16 ENCOUNTER — Other Ambulatory Visit: Payer: Self-pay

## 2020-04-16 VITALS — BP 140/63 | HR 58 | Temp 97.9°F | Resp 18 | Ht 71.0 in | Wt 204.4 lb

## 2020-04-16 DIAGNOSIS — Z87891 Personal history of nicotine dependence: Secondary | ICD-10-CM | POA: Diagnosis not present

## 2020-04-16 DIAGNOSIS — Z8269 Family history of other diseases of the musculoskeletal system and connective tissue: Secondary | ICD-10-CM | POA: Insufficient documentation

## 2020-04-16 DIAGNOSIS — Z8249 Family history of ischemic heart disease and other diseases of the circulatory system: Secondary | ICD-10-CM | POA: Diagnosis not present

## 2020-04-16 DIAGNOSIS — Z8042 Family history of malignant neoplasm of prostate: Secondary | ICD-10-CM | POA: Diagnosis not present

## 2020-04-16 DIAGNOSIS — Z8719 Personal history of other diseases of the digestive system: Secondary | ICD-10-CM | POA: Diagnosis not present

## 2020-04-16 DIAGNOSIS — R519 Headache, unspecified: Secondary | ICD-10-CM | POA: Diagnosis not present

## 2020-04-16 DIAGNOSIS — G9340 Encephalopathy, unspecified: Secondary | ICD-10-CM

## 2020-04-16 DIAGNOSIS — Z808 Family history of malignant neoplasm of other organs or systems: Secondary | ICD-10-CM | POA: Insufficient documentation

## 2020-04-16 DIAGNOSIS — M049 Autoinflammatory syndrome, unspecified: Secondary | ICD-10-CM

## 2020-04-16 DIAGNOSIS — D496 Neoplasm of unspecified behavior of brain: Secondary | ICD-10-CM | POA: Diagnosis not present

## 2020-04-16 DIAGNOSIS — Z881 Allergy status to other antibiotic agents status: Secondary | ICD-10-CM | POA: Insufficient documentation

## 2020-04-16 DIAGNOSIS — R413 Other amnesia: Secondary | ICD-10-CM

## 2020-04-16 DIAGNOSIS — Z86010 Personal history of colonic polyps: Secondary | ICD-10-CM | POA: Diagnosis not present

## 2020-04-16 DIAGNOSIS — Z7901 Long term (current) use of anticoagulants: Secondary | ICD-10-CM | POA: Insufficient documentation

## 2020-04-16 DIAGNOSIS — Z79899 Other long term (current) drug therapy: Secondary | ICD-10-CM | POA: Insufficient documentation

## 2020-04-16 DIAGNOSIS — Z882 Allergy status to sulfonamides status: Secondary | ICD-10-CM | POA: Diagnosis not present

## 2020-04-16 MED ORDER — DEXAMETHASONE 4 MG PO TABS: 4.0000 mg | ORAL_TABLET | Freq: Every day | ORAL | 1 refills | Status: DC

## 2020-04-16 NOTE — Progress Notes (Signed)
[facility] [place], [place] [address] [phone]   New Patient Evaluation  Date of Service: 04/16/20 Patient Name: Cory Mathis Patient MRN: [id number] Patient DOB: [birth year] Provider: [name], MD  Identifying Statement:  Cory Mathis is a 69 y.o. male with left frontal mass who presents for initial consultation and evaluation.    Referring Provider: Judith Part, MD [address] [place],  [place] [address]  Oncologic History: 04/06/20: Stereotactic biopsy by Dr. Zada Finders; path indeterminate  Biomarkers:  MGMT Unknown.  IDH 1/2 Unknown.  EGFR Unknown  TERT Unknown   History of Present Illness: The patient's records from the referring physician were obtained and reviewed and the patient interviewed to confirm this HPI.  Kareen Hitsman presented to medical attention in October 2021 with several weeks of daily headaches which were new.  Headaches are described as moderate pressure affecting bilateral temples, worse at night.  He also describes some modest impairment of short term memory, with some forgetting of names and dates.  No interference with day to day functioning.  He underwent brain MRI which demonstrated left frontal deep brain mass, which was biopsied on 04/06/20 by Dr. Zada Finders.  Since surgery he continues to have headaches but no other new or progressive changes.  Presents today for pathology review and treatment planning.    Medications: Current Outpatient Medications on File Prior to Visit  Medication Sig Dispense Refill  . amLODipine (NORVASC) 5 MG tablet TAKE 1 TABLET(5 MG) BY MOUTH TWICE DAILY (Patient taking differently: Take 5 mg by mouth daily. ) 180 tablet 3  . rivaroxaban (XARELTO) 20 MG TABS tablet Take 1 tablet (20 mg total) by mouth daily with supper. Restart on 04/09/20    . rosuvastatin (CRESTOR) 5 MG tablet TAKE 1/2 TABLET(2.5 MG) BY MOUTH DAILY (Patient taking differently: Take  2.5 mg by mouth daily. ) 45 tablet 1  . zolpidem (AMBIEN CR) 12.5 MG CR tablet Take 12.5 mg by mouth at bedtime.      No current facility-administered medications on file prior to visit.    Allergies:  Allergies  Allergen Reactions  . Other     Mints - makes him sneeze  . Sulfa Antibiotics Hives and Rash  . Sulfasalazine Hives and Other (See Comments)    other  . Metronidazole Other (See Comments)    Pt reported he was light headed for 2 weeks.     Past Medical History:  Past Medical History:  Diagnosis Date  . BPH (benign prostatic hyperplasia)   . Diverticulosis of colon   . GERD (gastroesophageal reflux disease)    prn med. control  . History of adenomatous polyp of colon    03-29-2007  tubular adenoma/  08-07-2016  hyperplastic and tubular adenoma's  . History of alcoholic gastritis    [id number]  gastritis, esophagitis, peptic duodenitis  . History of colonic diverticulitis    01-31-2016  resolved w/ medications  . History of Helicobacter pylori infection    10-30-2008  . 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 Hypertension   . Internal hemorrhoids   . Memory loss   . OSA on CPAP    CPAP nightly  . Right thyroid nodule   . Urinary retention 12/13/2016   Past Surgical History:  Past Surgical History:  Procedure Laterality Date  . APPLICATION OF CRANIAL NAVIGATION N/A 04/06/2020   Procedure: APPLICATION OF  CRANIAL NAVIGATION;  Surgeon: Judith Part, MD;  Location: [facility];  Service: Neurosurgery;  Laterality: N/A;  . CARDIOVERSION N/A 12/23/2018   Procedure: CARDIOVERSION;  Surgeon: Buford Dresser, MD;  Location: [facility] ENDOSCOPY;  Service: Cardiovascular;  Laterality: N/A;  . COLONOSCOPY  last one 08-07-2016  . FRAMELESS  BIOPSY WITH BRAINLAB Left 04/06/2020   Procedure: Left Stereotactic brain biopsy with brainlab;  Surgeon: Judith Part, MD;  Location: [facility];  Service: Neurosurgery;   Laterality: Left;  . HEMORRHOID BANDING  10-20-2016;  09-11-2016  . HERNIA REPAIR    . KNEE ARTHROSCOPY Bilateral    10 + yrs ago  . LASIK Bilateral   . PILONIDAL CYST EXCISION    . THYROIDECTOMY Left 2013  . TRANSURETHRAL RESECTION OF PROSTATE N/A 01/04/2017   Procedure: TRANSURETHRAL RESECTION OF THE PROSTATE (TURP);  Surgeon: Franchot Gallo, MD;  Location: [facility];  Service: Urology;  Laterality: N/A;  . UPPER GASTROINTESTINAL ENDOSCOPY  10/30/2008  . WISDOM TOOTH EXTRACTION     Social History:  Social History   Socioeconomic History  . Marital status: Married    Spouse name: Not on file  . Number of children: 0  . Years of education: 16+  . Highest education level: Bachelor's degree (e.g., BA, AB, BS)  Occupational History  . Occupation: Retired  Tobacco Use  . Smoking status: Former Research scientist (life sciences)  . Smokeless tobacco: Never Used  Vaping Use  . Vaping Use: Never used  Substance and Sexual Activity  . Alcohol use: Yes    Comment: social   . Drug use: No  . Sexual activity: Not on file  Other Topics Concern  . Not on file  Social History Narrative   Left-handed.   1.5 cups caffeine per day.   Social Determinants of Health   Financial Resource Strain:   . Difficulty of Paying Living Expenses: Not on file  Food Insecurity:   . Worried About Charity fundraiser in the Last Year: Not on file  . Ran Out of Food in the Last Year: Not on file  Transportation Needs:   . Lack of Transportation (Medical): Not on file  . Lack of Transportation (Non-Medical): Not on file  Physical Activity:   . Days of Exercise per Week: Not on file  . Minutes of Exercise per Session: Not on file  Stress:   . Feeling of Stress : Not on file  Social Connections:   . Frequency of Communication with Friends and Family: Not on file  . Frequency of Social Gatherings with Friends and Family: Not on file  . Attends Religious Services: Not on file  . Active Member of Clubs or  Organizations: Not on file  . Attends Archivist Meetings: Not on file  . Marital Status: Not on file  Intimate Partner Violence:   . Fear of Current or Ex-Partner: Not on file  . Emotionally Abused: Not on file  . Physically Abused: Not on file  . Sexually Abused: Not on file   Family History:  Family History  Problem Relation Age of Onset  . Heart attack Mother        93's  . Other Father        atherosclerosis - 18's  . Prostate cancer Maternal Grandfather   . Other Sister        brain tumor - unsure if it was cancer - 23's  . Colon cancer Neg Hx   . Esophageal cancer Neg Hx   .  Pancreatic cancer Neg Hx   . Rectal cancer Neg Hx   . Stomach cancer Neg Hx     Review of Systems: Constitutional: Doesn't report fevers, chills or abnormal weight loss Eyes: Doesn't report blurriness of vision Ears, nose, mouth, throat, and face: Doesn't report sore throat Respiratory: Doesn't report cough, dyspnea or wheezes Cardiovascular: Doesn't report palpitation, chest discomfort  Gastrointestinal:  Doesn't report nausea, constipation, diarrhea GU: Doesn't report incontinence Skin: Doesn't report skin rashes Neurological: Per HPI Musculoskeletal: Doesn't report joint pain Behavioral/Psych: Doesn't report anxiety  Physical Exam: Vitals:   04/16/20 1047  BP: 140/63  Pulse: (!) 58  Resp: 18  Temp: 97.9 F (36.6 C)  SpO2: 98%   KPS: 90. General: Alert, cooperative, pleasant, in no acute distress Head: Normal EENT: No conjunctival injection or scleral icterus.  Lungs: Resp effort normal Cardiac: Regular rate Abdomen: Non-distended abdomen Skin: No rashes cyanosis or petechiae. Extremities: No clubbing or edema  Neurologic Exam: Mental Status: Awake, alert, attentive to examiner. Oriented to self and environment. Language is fluent with intact comprehension.  Cranial Nerves: Visual acuity is grossly normal. Visual fields are full. Extra-ocular movements intact. No  ptosis. Face is symmetric Motor: Tone and bulk are normal. Power is full in both arms and legs. Reflexes are symmetric, no pathologic reflexes present.  Sensory: Intact to light touch Gait: Normal.   Labs: I have reviewed the data as listed    Component Value Date/Time   NA 138 04/06/2020 0737   NA 139 08/22/2019 1432   K 3.9 04/06/2020 0737   CL 107 04/06/2020 0737   CO2 24 04/06/2020 0737   GLUCOSE 100 (H) 04/06/2020 0737   BUN 7 (L) 04/06/2020 0737   BUN 10 08/22/2019 1432   CREATININE 0.90 04/06/2020 0737   CREATININE 0.86 09/27/2017 0959   CALCIUM 9.2 04/06/2020 0737   PROT 7.6 11/29/2018 1802   ALBUMIN 4.2 11/29/2018 1802   AST 15 04/18/2019 0803   ALT 15 11/29/2018 1802   ALKPHOS 120 11/29/2018 1802   BILITOT 0.9 11/29/2018 1802   GFRNONAA >60 04/06/2020 0737   GFRAA 96 08/22/2019 1432   Lab Results  Component Value Date   WBC 8.1 04/06/2020   NEUTROABS 6.6 11/29/2018   HGB 13.5 04/06/2020   HCT 38.9 (L) 04/06/2020   MCV 93.1 04/06/2020   PLT 280 04/06/2020    Imaging:  CT HEAD WO CONTRAST  Result Date: 04/06/2020 CLINICAL DATA:  Status post brain biopsy EXAM: CT HEAD WITHOUT CONTRAST TECHNIQUE: Contiguous axial images were obtained from the base of the skull through the vertex without intravenous contrast. COMPARISON:  03/25/2020 FINDINGS: Brain: Small amount of air and hemorrhage at left basal ganglia biopsy site. Mild surrounding edema. No midline shift. New focus of hypodensity within the lower pons. Vascular: No hyperdense vessel or unexpected calcification. Skull: Normal. Negative for fracture or focal lesion. Sinuses/Orbits: No acute finding. Other: None. IMPRESSION: 1. Small amount of air and hemorrhage at left basal ganglia biopsy site. 2. New focus of hypodensity within the lower pons, concerning for acute or subacute infarct. Electronically Signed   By: Ulyses Jarred M.D.   On: 04/06/2020 22:25   CT HEAD WO CONTRAST  Result Date: 03/25/2020 CLINICAL  DATA:  Headaches. EXAM: CT HEAD WITHOUT CONTRAST TECHNIQUE: Contiguous axial images were obtained from the base of the skull through the vertex without intravenous contrast. COMPARISON:  None. FINDINGS: Brain: There appears to be an ill-defined mass in the left basal ganglia measuring at least  3.7 x 2.3 cm, highly concerning for neoplasm or malignancy. This results in approximately 6 mm of left-to-right midline shift. Ventricular size is within normal limits. No definite hemorrhage is noted. Vascular: No hyperdense vessel or unexpected calcification. Skull: Normal. Negative for fracture or focal lesion. Sinuses/Orbits: No acute finding. Other: None. IMPRESSION: Ill-defined mass is noted in the left basal ganglia measuring at least 3.7 x 2.3 cm, highly concerning for neoplasm or malignancy. This results in approximately 6 mm of left-to-right midline shift. MRI with and without gadolinium is recommended for further evaluation. These results will be called to the ordering clinician or representative by the Radiologist Assistant, and communication documented in the PACS or zVision Dashboard. Electronically Signed   By: Marijo Conception M.D.   On: 03/25/2020 15:04   MR BRAIN WO CONTRAST  Result Date: 04/07/2020 CLINICAL DATA:  Status post brain biopsy. EXAM: MRI HEAD WITHOUT CONTRAST TECHNIQUE: Multiplanar, multiecho pulse sequences of the brain and surrounding structures were obtained without intravenous contrast. COMPARISON:  CT head 04/06/2020, MRI 03/30/2020 FINDINGS: Brain: When comparing across modalities, similar appearance of a small amount of hemorrhage and gas at the site of biopsy in the left basal ganglia. Small linear biopsy tract through the frontal lobe with a small amount hemorrhage along the tract. When comparing to prior MRI, similar appearance of an infiltrating mass lesion centered at the left basal ganglia with abnormal T2/FLAIR radiating into surrounding white matter tracks, as detailed on  03/31/2019. no acute infarct. No hydrocephalus. No extra-axial fluid collection. Vascular: Major arterial flow voids are maintained at the skull base. Skull and upper cervical spine: Left frontal burr hole from recent biopsy Sinuses/Orbits: Mild ethmoid air cell mucosal thickening. No air-fluid levels. Unremarkable orbits. Other: None. IMPRESSION: 1. When comparing across modalities, similar postsurgical changes at the biopsy site and similar infiltrating mass centered at the left basal ganglia, as detailed above. 2. No evidence of new acute abnormality.  No acute infarct. Electronically Signed   By: Margaretha Sheffield MD   On: 04/07/2020 11:18   MR BRAIN W WO CONTRAST  Result Date: 03/30/2020 CLINICAL DATA:  Headaches.  Brain tumor. EXAM: MRI HEAD WITHOUT AND WITH CONTRAST TECHNIQUE: Multiplanar, multiecho pulse sequences of the brain and surrounding structures were obtained without and with intravenous contrast. CONTRAST:  56m GADAVIST GADOBUTROL 1 MMOL/ML IV SOLN COMPARISON:  Head CT 03/25/2020.  MRI 07/30/2017. FINDINGS: Brain: The brainstem and cerebellum are normal. There is an infiltrating mass lesion centered at the base of the brain on the left with involvement of the basal ganglia and radiating white matter tracts. The lesion is difficult to measure precisely but is approximately 4.5 cm in diameter. There are low level blood products resulting in some susceptibility artifact in the central portion. Few small cystic areas without large areas of necrosis. Tumor may cross the midline into the right hemisphere by the anterior commissure. This is a minimal finding. The enhancement pattern shows patchy enhancement, more towards the central portion. Findings most worrisome for glioblastoma. Mass effect upon the left lateral ventricle. No midline shift. No obstructive hydrocephalus. No extra-axial collection. Vascular: Major vessels at the base of the brain show flow. Skull and upper cervical spine: Negative  Sinuses/Orbits: Clear/normal Other: None IMPRESSION: Approximately 4.5 cm infiltrating mass centered at the base of the brain on the left with involvement of the basal ganglia and radiating white matter tracts. Low level blood products. Mass effect upon the left lateral ventricle but no midline shift. Question crossing through  the anterior commissure to the right hemisphere, a minimal finding if present. Findings most worrisome for glioblastoma. Electronically Signed   By: Nelson Chimes M.D.   On: 03/30/2020 17:49    Pathology: SURGICAL PATHOLOGY  * THIS IS AN AMENDED REPORT *  CASE: [id number]  PATIENT: Kaiea Guarino  Surgical Pathology Report  * Amendment *   Reason for Amendment #1: Outside consultation   Clinical History: brain tumor (cm)    FINAL MICROSCOPIC DIAGNOSIS:   A. BRAIN, LEFT, BIOPSY:  - Hypercellular brain tissue with an inflammatory infiltrate  - See comment   COMMENT:   DIAGNOSTIC AMENDMENT:   ATTENTION: REVISED DIAGNOSIS. THIS MAY AFFECT DIAGNOSTIC AND/OR  THERAPUTIC DECISIONS. The final report should read "Hypercellular brain  tissue" INSTEAD OF "Glial neoplasm; pending outside consultation with  neuro pathology". This has been corrected. This revised report was  called to Dr. Venetia Constable by Dr. Melina Copa on April 13, 2020.   The following comment was provided by Dr. Maisie Fus (Neuropathologist,  [facility]):   "The biopsy shows hypercellular brain tissue with a brisk perivascular  and parenchymal inflammatory infiltrate. The inflammatory cells include  CD3 positive T cells, CD20 positive B cells and CD68 positive  macrophages. There is no evidence of vasculitis or granulomatous  inflammation, and organisms are not identified. IDH1 R132H,  toxoplasmosis, SV40 and HSV immunohistochemical stains are negative.  The differential diagnosis may include inflammatory or infectious  etiologies, demyelinating disease and others. The histology does not   suggest a glial neoplasm, although due to clinical concern for  glioblastoma we are performing EGFR and TERT studies to further  evaluate. If the lesion progresses or if there is concern for  potentially undersampled lesion consider repeat biopsy if clinically  indicated." Please see the patient's electronic medical record for a  complete copy of the report.    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  He presents with radiographic syndrome consistent with left frontal white matter and internal capsule neoplastic or inflammatory process. Lack of clarity on histology does not rule out neoplasm given small amount of tissue retrieval and concern for undersampling.  Biopsy tract does localize nicely to region of T2 signal abnormality, but this is peripheral to the central region of mass effect.  We will perform some inflammatory workup with serologies: ESR, CRP, ANA, Lyme, HIV.  Suspicion is modest here given completely normal brain MRI from 2 years prior.  Will also recommend dosing decadron given some clinical symptoms (headache, cognitive) and potential to impact next MRI scan of etiology is inflammatory.  Will order decadron 68m daily.  Should repeat brain MRI in 1 month.    We will also reach out to Dr. CMaisie Fusregarding EGFR and TERT status.    Screening for potential clinical trials was performed and discussed using eligibility criteria for active protocols [facility], as well as national database available on [email or web address]    The patient is not a candidate for a research protocol at this time due to no suitable study identified.   We spent twenty additional minutes teaching regarding the natural history, biology, and historical experience in the treatment of brain tumors. We then discussed in detail the current recommendations for therapy focusing on the mode of  administration, mechanism of action, anticipated toxicities, and quality of life issues associated with this plan. We also provided teaching sheets for the patient to take home as an additional  resource.  He will return to clinic following next MRI or sooner if indicated clinically.  All questions were answered. The patient knows to call the clinic with any problems, questions or concerns. No barriers to learning were detected.  The total time spent in the encounter was 60 minutes and more than 50% was on counseling and review of test results   [name], MD Medical Director of Neuro-Oncology [facility] at [place] 04/16/20 10:51 AM

## 2020-04-19 ENCOUNTER — Other Ambulatory Visit: Payer: Self-pay | Admitting: Radiation Therapy

## 2020-04-23 DIAGNOSIS — L821 Other seborrheic keratosis: Secondary | ICD-10-CM | POA: Diagnosis not present

## 2020-04-23 DIAGNOSIS — L905 Scar conditions and fibrosis of skin: Secondary | ICD-10-CM | POA: Diagnosis not present

## 2020-04-23 DIAGNOSIS — D2262 Melanocytic nevi of left upper limb, including shoulder: Secondary | ICD-10-CM | POA: Diagnosis not present

## 2020-04-23 DIAGNOSIS — D225 Melanocytic nevi of trunk: Secondary | ICD-10-CM | POA: Diagnosis not present

## 2020-04-23 DIAGNOSIS — L814 Other melanin hyperpigmentation: Secondary | ICD-10-CM | POA: Diagnosis not present

## 2020-04-23 DIAGNOSIS — D485 Neoplasm of uncertain behavior of skin: Secondary | ICD-10-CM | POA: Diagnosis not present

## 2020-04-23 DIAGNOSIS — D1801 Hemangioma of skin and subcutaneous tissue: Secondary | ICD-10-CM | POA: Diagnosis not present

## 2020-04-23 DIAGNOSIS — D2271 Melanocytic nevi of right lower limb, including hip: Secondary | ICD-10-CM | POA: Diagnosis not present

## 2020-04-23 DIAGNOSIS — D2272 Melanocytic nevi of left lower limb, including hip: Secondary | ICD-10-CM | POA: Diagnosis not present

## 2020-04-23 DIAGNOSIS — D2261 Melanocytic nevi of right upper limb, including shoulder: Secondary | ICD-10-CM | POA: Diagnosis not present

## 2020-04-26 ENCOUNTER — Inpatient Hospital Stay (HOSPITAL_BASED_OUTPATIENT_CLINIC_OR_DEPARTMENT_OTHER): Payer: Medicare Other | Admitting: Internal Medicine

## 2020-04-26 ENCOUNTER — Inpatient Hospital Stay: Payer: Medicare Other

## 2020-04-26 DIAGNOSIS — D496 Neoplasm of unspecified behavior of brain: Secondary | ICD-10-CM

## 2020-04-26 NOTE — Progress Notes (Signed)
I connected with Cory Mathis on 04/26/20 at  3:00 PM EST by telephone visit and verified that I am speaking with the correct person using two identifiers.  I discussed the limitations, risks, security and privacy concerns of performing an evaluation and management service by telemedicine and the availability of in-person appointments. I also discussed with the patient that there may be a patient responsible charge related to this service. The patient expressed understanding and agreed to proceed.  Other persons participating in the visit and their role in the encounter:  spouse  Patient's location:  Home  Provider's location:  Office  Chief Complaint:  Brain tumor Cory Mathis)  History of Present Ilness: Cory Mathis decribes improvement in headaches and mental clarity, short term memory since initiating decadron.  Currently dosing 44m daily.  No other new or progressive complaints.  No seizures.   Observations: Language and cognition at baseline Assessment and Plan: Brain tumor (Cory Mathis  Today we [facility]; namely absence of EGFR or TERT mutations.  We discussed his case extensively in brain/spine tumor board this morning, and recommended repeat tissue sampling with second stereotactic biopsy.  Risk of "waiting" on diagnosis given remaining high suspicion for intermediate/high grade glioma is potentially high.       Follow Up Instructions: Discussed case with Dr. OZada Finderswho will reach out to schedule biopsy.  Patient is agreeable with this plan.  I discussed the assessment and treatment plan with the patient.  The patient was provided an opportunity to ask questions and all were answered.  The patient agreed with the plan and demonstrated understanding of the instructions.    The patient was advised to call back or seek an in-person evaluation if the symptoms worsen or if the condition fails to improve as anticipated.  I provided 5-10 minutes of non-face-to-face  time during this enocunter.  Cory Sellers MD   I provided 15 minutes of non face-to-face telephone visit time during this encounter, and > 50% was spent counseling as documented under my assessment & plan.

## 2020-04-27 ENCOUNTER — Other Ambulatory Visit: Payer: Self-pay | Admitting: Radiation Therapy

## 2020-04-27 ENCOUNTER — Other Ambulatory Visit: Payer: Self-pay | Admitting: Neurological Surgery

## 2020-04-28 ENCOUNTER — Other Ambulatory Visit: Payer: Self-pay | Admitting: Internal Medicine

## 2020-04-28 NOTE — Progress Notes (Signed)
Your procedure is scheduled on Monday, November 22nd.  Report to [facility] Main Entrance "A" at 8:30 A.M., and check in at the Admitting office.  Call this number if you have problems the morning of surgery:  [phone]  Call [phone] if you have any questions prior to your surgery date Monday-Friday 8am-4pm   Remember:  Do not eat or drink after midnight the night before your surgery    Take these medicines the morning of surgery with A SIP OF WATER  amLODipine (NORVASC)  dexamethasone (DECADRON)  rosuvastatin (CRESTOR)   Follow your surgeon's instructions on when to stop XARELTO.  If no instructions were given by your surgeon then you will need to call the office to get those instructions.    As of today, STOP taking any Aspirin (unless otherwise instructed by your surgeon) Aleve, Naproxen, Ibuprofen, Motrin, Advil, Goody's, BC's, all herbal medications, fish oil, and all vitamins.                     Do not wear jewelry            Do not wear lotions, powders, colognes, or deodorant.            Men may shave face and neck.            Do not bring valuables to the hospital.            [facility] is not responsible for any belongings or valuables.  Do NOT Smoke (Tobacco/Vaping) or drink Alcohol 24 hours prior to your procedure If you use a CPAP at night, you may bring all equipment for your overnight stay.   Contacts, glasses, dentures or bridgework may not be worn into surgery.      For patients admitted to the hospital, discharge time will be determined by your treatment team.   Patients discharged the day of surgery will not be allowed to drive home, and someone needs to stay with them for 24 hours.  Special instructions:   [place]- Preparing For Surgery  Before surgery, you can play an important role. Because skin is not sterile, your skin needs to be as free of germs as possible. You can reduce the number of germs on your skin by washing with CHG  (chlorahexidine gluconate) Soap before surgery.  CHG is an antiseptic cleaner which kills germs and bonds with the skin to continue killing germs even after washing.    Oral Hygiene is also important to reduce your risk of infection.  Remember - BRUSH YOUR TEETH THE MORNING OF SURGERY WITH YOUR REGULAR TOOTHPASTE  Please do not use if you have an allergy to CHG or antibacterial soaps. If your skin becomes reddened/irritated stop using the CHG.  Do not shave (including legs and underarms) for at least 48 hours prior to first CHG shower. It is OK to shave your face.  Please follow these instructions carefully.   1. Shower the NIGHT BEFORE SURGERY and the MORNING OF SURGERY with CHG Soap.   2. If you chose to wash your hair, wash your hair first as usual with your normal shampoo.  3. After you shampoo, rinse your hair and body thoroughly to remove the shampoo.  4. Use CHG as you would any other liquid soap. You can apply CHG directly to the skin and wash gently with a scrungie or a clean washcloth.   5. Apply the CHG Soap to your body ONLY FROM THE NECK DOWN.  Do not use on open wounds or open sores. Avoid contact with your eyes, ears, mouth and genitals (private parts). Wash Face and genitals (private parts)  with your normal soap.   6. Wash thoroughly, paying special attention to the area where your surgery will be performed.  7. Thoroughly rinse your body with warm water from the neck down.  8. DO NOT shower/wash with your normal soap after using and rinsing off the CHG Soap.  9. Pat yourself dry with a CLEAN TOWEL.  10. Wear CLEAN PAJAMAS to bed the night before surgery  11. Place CLEAN SHEETS on your bed the night of your first shower and DO NOT SLEEP WITH PETS.  Day of Surgery: Wear Clean/Comfortable clothing the morning of surgery Do not apply any deodorants/lotions.   Remember to brush your teeth WITH YOUR REGULAR TOOTHPASTE.   Please read over the following fact sheets that  you were given.

## 2020-04-28 NOTE — Telephone Encounter (Signed)
Pt last saw [name] on 03/08/20, last labs 04/06/20 Creat 0.90, age 69, weight 92.7kg, CrCl 100.14, based on CrCl pt is on appropriate dosage of Xarelto 20mg  QD.

## 2020-04-29 ENCOUNTER — Inpatient Hospital Stay (HOSPITAL_BASED_OUTPATIENT_CLINIC_OR_DEPARTMENT_OTHER): Payer: Medicare Other | Admitting: Internal Medicine

## 2020-04-29 ENCOUNTER — Encounter (HOSPITAL_COMMUNITY): Payer: Self-pay

## 2020-04-29 ENCOUNTER — Other Ambulatory Visit: Payer: Self-pay | Admitting: Neurological Surgery

## 2020-04-29 ENCOUNTER — Other Ambulatory Visit: Payer: Self-pay

## 2020-04-29 ENCOUNTER — Other Ambulatory Visit (HOSPITAL_COMMUNITY)
Admission: RE | Admit: 2020-04-29 | Discharge: 2020-04-29 | Disposition: A | Discharge status: Home / Self Care | Payer: Medicare Other | Source: Ambulatory Visit | Attending: Neurological Surgery | Admitting: Neurological Surgery

## 2020-04-29 ENCOUNTER — Other Ambulatory Visit (HOSPITAL_COMMUNITY): Payer: Self-pay | Admitting: Neurological Surgery

## 2020-04-29 ENCOUNTER — Encounter (HOSPITAL_COMMUNITY)
Admission: RE | Admit: 2020-04-29 | Discharge: 2020-04-29 | Disposition: A | Discharge status: Home / Self Care | Payer: Medicare Other | Source: Ambulatory Visit | Attending: Neurological Surgery | Admitting: Neurological Surgery

## 2020-04-29 DIAGNOSIS — Z20822 Contact with and (suspected) exposure to covid-19: Secondary | ICD-10-CM | POA: Insufficient documentation

## 2020-04-29 DIAGNOSIS — Z01812 Encounter for preprocedural laboratory examination: Secondary | ICD-10-CM | POA: Insufficient documentation

## 2020-04-29 DIAGNOSIS — D496 Neoplasm of unspecified behavior of brain: Secondary | ICD-10-CM | POA: Diagnosis not present

## 2020-04-29 LAB — CBC
HCT: 46.4 % (ref 39.0–52.0)
Hemoglobin: 15.6 g/dL (ref 13.0–17.0)
MCH: 32.3 pg (ref 26.0–34.0)
MCHC: 33.6 g/dL (ref 30.0–36.0)
MCV: 96.1 fL (ref 80.0–100.0)
Platelets: 293 10*3/uL (ref 150–400)
RBC: 4.83 MIL/uL (ref 4.22–5.81)
RDW: 12.6 % (ref 11.5–15.5)
WBC: 18.5 10*3/uL — ABNORMAL HIGH (ref 4.0–10.5)
nRBC: 0 % (ref 0.0–0.2)

## 2020-04-29 LAB — BASIC METABOLIC PANEL
Anion gap: 7 (ref 5–15)
BUN: 13 mg/dL (ref 8–23)
CO2: 30 mmol/L (ref 22–32)
Calcium: 9.4 mg/dL (ref 8.9–10.3)
Chloride: 102 mmol/L (ref 98–111)
Creatinine, Ser: 0.9 mg/dL (ref 0.61–1.24)
GFR, Estimated: >60 mL/min (ref >60)
Glucose, Bld: 96 mg/dL (ref 70–99)
Potassium: 4.5 mmol/L (ref 3.5–5.1)
Sodium: 139 mmol/L (ref 135–145)

## 2020-04-29 LAB — TYPE AND SCREEN
ABO/RH(D): A POS
Antibody Screen: NEGATIVE

## 2020-04-29 LAB — SURGICAL PCR SCREEN
MRSA, PCR: NEGATIVE
Staphylococcus aureus: NEGATIVE

## 2020-04-29 MED ORDER — DEXAMETHASONE 2 MG PO TABS: 2.0000 mg | ORAL_TABLET | Freq: Every day | ORAL | Status: DC

## 2020-04-29 NOTE — Progress Notes (Addendum)
PCP - Dr. Yaakov Guthrie Cardiologist - Dr. Dorris Carnes  PPM/ICD - Denies  Holter Monitor: 01/18/18  Chest x-ray - N/A EKG - 03/08/20 Stress Test - Denies ECHO - 12/20/18 Cardiac Cath - Denies  Sleep Study - Yes CPAP - Yes  Patient denies having diabetes.  Blood Thinner Instructions: LD of Xarelto: 04/30/20 Aspirin Instructions: N/A   ERAS Protcol - No  COVID TEST- 04/29/20   Coronavirus Screening  Have you experienced the following symptoms:  Cough yes/no: No Fever (>100.64F)  yes/no: No Runny nose yes/no: No Sore throat yes/no: No Difficulty breathing/shortness of breath  yes/no: No  Have you or a family member traveled in the last 14 days and where? yes/no: No   If the patient indicates "YES" to the above questions, their PAT will be rescheduled to limit the exposure to others and, the surgeon will be notified. THE PATIENT WILL NEED TO BE ASYMPTOMATIC FOR 14 DAYS.   If the patient is not experiencing any of these symptoms, the PAT nurse will instruct them to NOT bring anyone with them to their appointment since they may have these symptoms or traveled as well.   Please remind your patients and families that hospital visitation restrictions are in effect and the importance of the restrictions.     Anesthesia review: Yes, cardiac hx  Patient denies shortness of breath, fever, cough and chest pain at PAT appointment   All instructions explained to the patient, with a verbal understanding of the material. Patient agrees to go over the instructions while at home for a better understanding. Patient also instructed to self quarantine after being tested for COVID-19. The opportunity to ask questions was provided.

## 2020-04-29 NOTE — Progress Notes (Signed)
I connected with [name] on 04/29/20 at  2:00 PM EST by telephone visit and verified that I am speaking with the correct person using two identifiers.  I discussed the limitations, risks, security and privacy concerns of performing an evaluation and management service by telemedicine and the availability of in-person appointments. I also discussed with the patient that there may be a patient responsible charge related to this service. The patient expressed understanding and agreed to proceed.  Other persons participating in the visit and their role in the encounter:  spouse  Patient's location:  Home  Provider's location:  Office  Chief Complaint:  Brain tumor [facility])  History of Present Ilness: Cory Mathis describes insomnia this past week which he attributes to steroid dosing.  Also complains of some stiffness and tightening of his left calf and lower leg following 4 mile walk earlier this week.  He currently has MRI scheduled for tomorrow, pre-op planning, biopsy scheduled for Monday 11/22.   Observations: Language and cognition at baseline Assessment and Plan: Brain tumor ([place]) Follow Up Instructions: Recommend decreasing decadron to 2mg  daily.  Will follow up with him after biopsy next week.  I discussed the assessment and treatment plan with the patient.  The patient was provided an opportunity to ask questions and all were answered.  The patient agreed with the plan and demonstrated understanding of the instructions.    The patient was advised to call back or seek an in-person evaluation if the symptoms worsen or if the condition fails to improve as anticipated.  I provided 5-10 minutes of non-face-to-face time during this enocunter.  [name], MD   I provided 15 minutes of non face-to-face telephone visit time during this encounter, and > 50% was spent counseling as documented under my assessment & plan.

## 2020-04-30 ENCOUNTER — Encounter (HOSPITAL_COMMUNITY): Payer: Self-pay | Admitting: Physician Assistant

## 2020-04-30 ENCOUNTER — Encounter (HOSPITAL_COMMUNITY): Payer: Self-pay | Admitting: Certified Registered Nurse Anesthetist

## 2020-04-30 ENCOUNTER — Ambulatory Visit (HOSPITAL_COMMUNITY)
Admission: RE | Admit: 2020-04-30 | Discharge: 2020-04-30 | Disposition: A | Discharge status: Home / Self Care | Payer: Medicare Other | Source: Ambulatory Visit | Attending: Neurological Surgery | Admitting: Neurological Surgery

## 2020-04-30 DIAGNOSIS — I619 Nontraumatic intracerebral hemorrhage, unspecified: Secondary | ICD-10-CM | POA: Diagnosis not present

## 2020-04-30 DIAGNOSIS — D496 Neoplasm of unspecified behavior of brain: Secondary | ICD-10-CM | POA: Diagnosis not present

## 2020-04-30 LAB — SARS CORONAVIRUS 2 (TAT 6-24 HRS): SARS Coronavirus 2: NEGATIVE

## 2020-04-30 IMAGING — MR MR HEAD WO/W CM
11 of 14 series · 18 of 48 positions shown · IV contrast (9.5 M GAD)
Comparison: Post biopsy MRI [DATE].  Pre biopsy MRI [DATE].
COMPARISON: Post biopsy MRI [DATE].  Pre biopsy MRI [DATE].

Addendum:
CLINICAL DATA: 69-year-old male with nonenhancing masslike T2 and
FLAIR hyperintense lesion in the left hemisphere status post needle
biopsy in [REDACTED] with indeterminate pathology results
(hypercellular brain tissue with inflammatory infiltrate).
Stereotactic planning for re-biopsy.

EXAM:
MRI HEAD WITHOUT AND WITH CONTRAST
TECHNIQUE: Multiplanar, multiecho pulse sequences of the brain and surrounding
structures were obtained without and with intravenous contrast.
CONTRAST:  9.5mL GADAVIST GADOBUTROL 1 MMOL/ML IV SOLN

[Series 2: FLAIR · sagittal · 3.0mm · 0.47mm/px · 1 of 49 slices shown (1 of 2)]
[im 1/49]
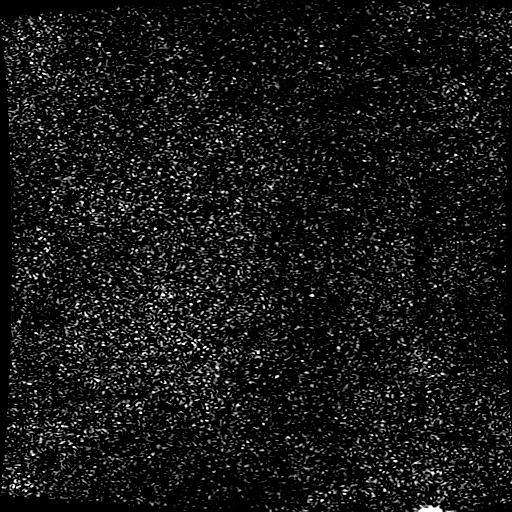

[Series 3: DWI · axial · 3.0mm · 1.02mm/px · z∈[-33,+137]mm · 3 of 116 slices shown]
[im 1/116]
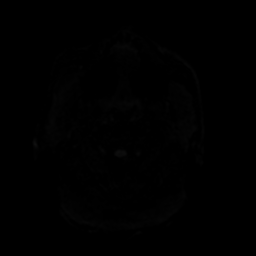
[im 58/116]
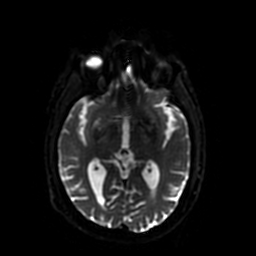
[im 116/116]
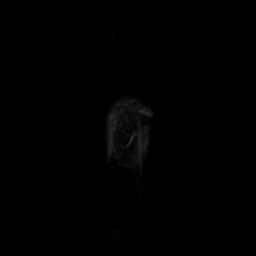

[Series 4: FLAIR · axial · 3.0mm · 0.51mm/px · 1 of 61 slices shown (2 of 2)]
[im 1/61]
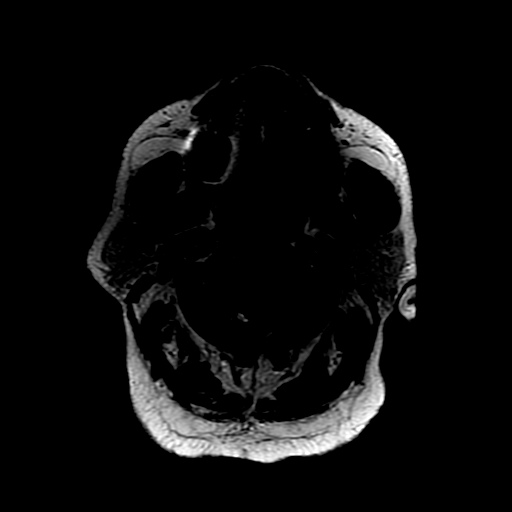

[Series 5: SWI · axial · 3.0mm · 0.51mm/px · z∈[-36,+143]mm · 3 of 120 slices shown (1 of 2)]
[im 1/120]
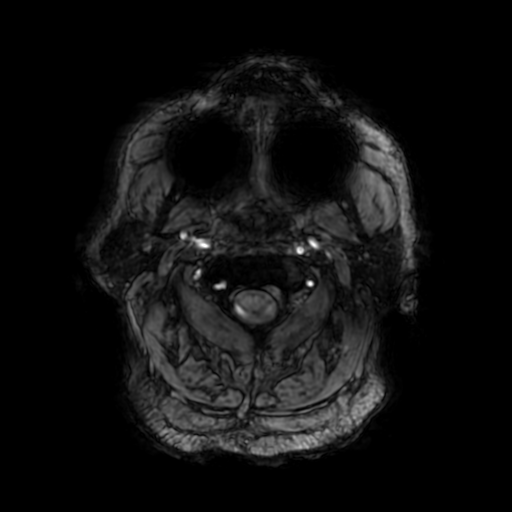
[im 60/120]
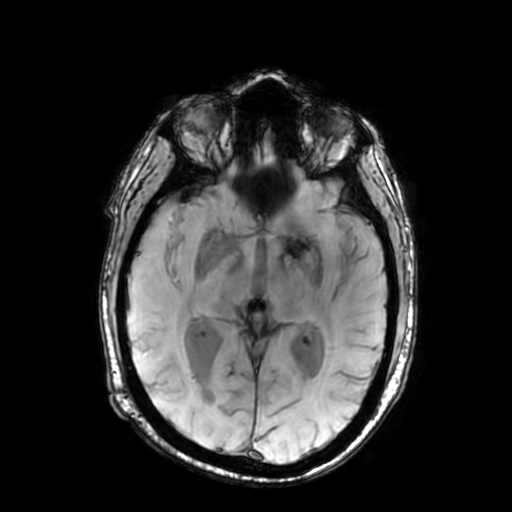
[im 120/120]
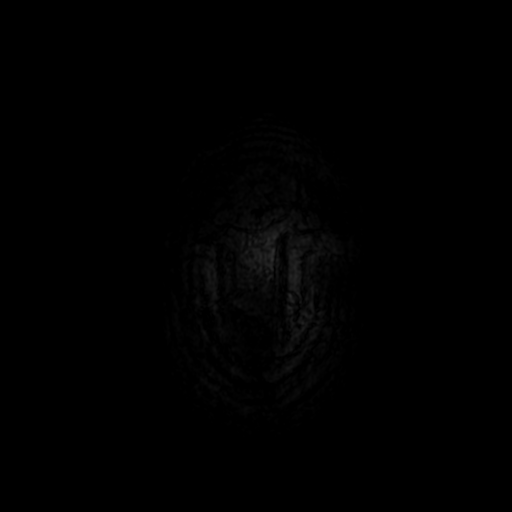

[Series 7: T2 post-contrast · coronal · 3.0mm · 0.43mm/px · 1 of 60 slices shown (1 of 2)]
[im 1/60]
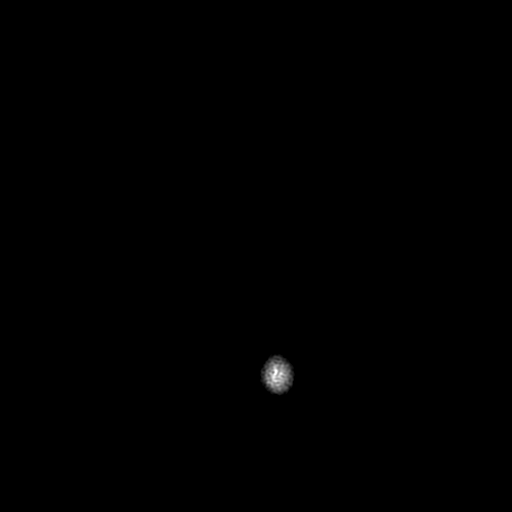

[Series 8: T2 post-contrast · axial · 5.0mm · 0.51mm/px · 1 of 32 slices shown (2 of 2)]
[im 1/32]
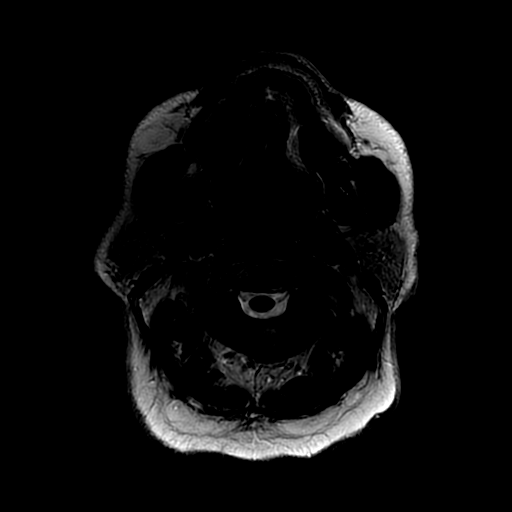

[Series 9: T1 post-contrast · coronal · 3.0mm · 0.43mm/px · 1 of 60 slices shown]
[im 1/60]
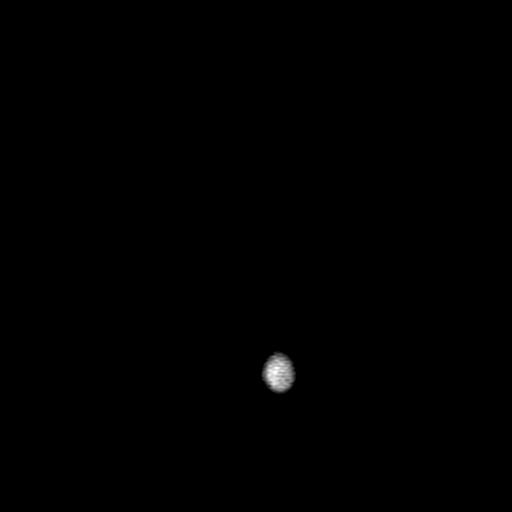

[Series 10: FLAIR post-contrast · sagittal · 3.0mm · 0.47mm/px · 1 of 49 slices shown]
[im 1/49]
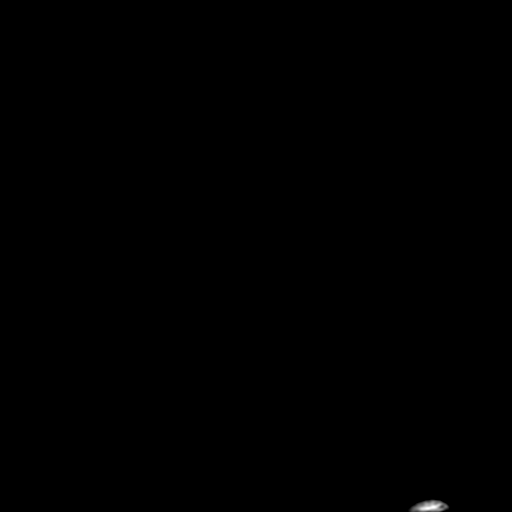

[Series 350: ADC · axial · 3.0mm · 1.02mm/px · 1 of 58 slices shown]
[im 1/58]
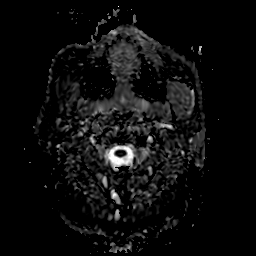

[Series 500: SWI · axial · 3.0mm · 0.51mm/px · z∈[-36,+143]mm · 3 of 118 slices shown (2 of 2)]
[im 1/118]
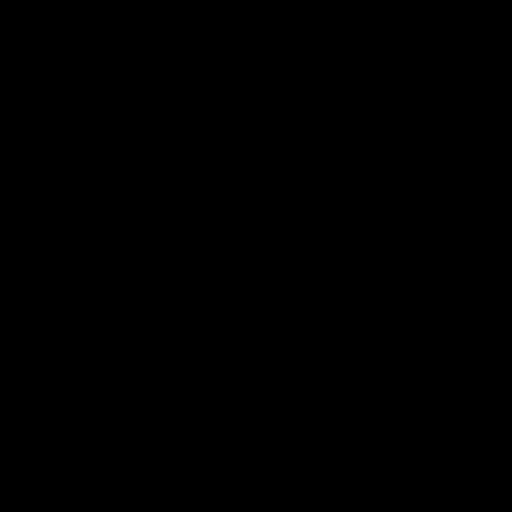
[im 59/118]
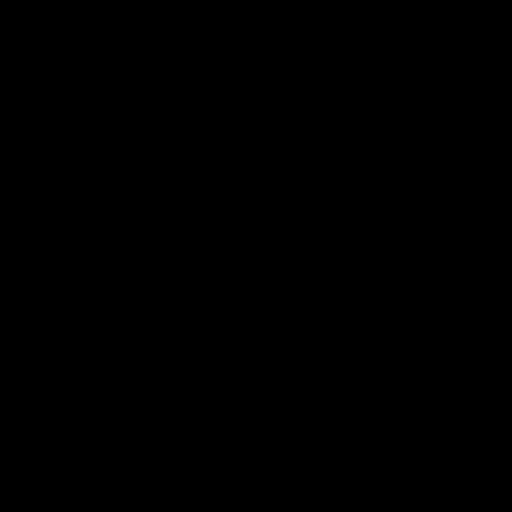
[im 118/118]
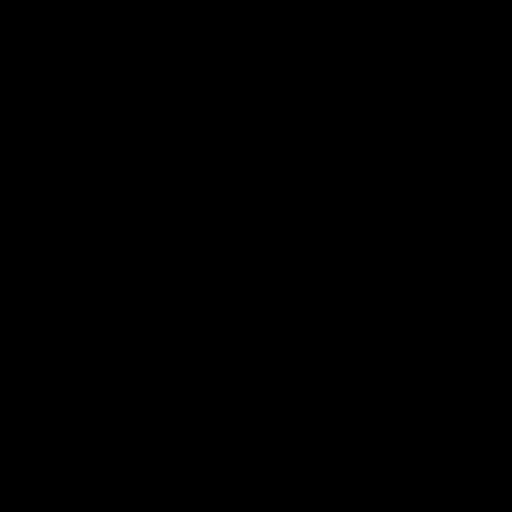

[Series 600: multiplanar reconstruction (mpr) · axial · 0.9mm · 0.50mm/px · z∈[-92,-50]mm · 2 of 301 slices shown]
[im 1/301]
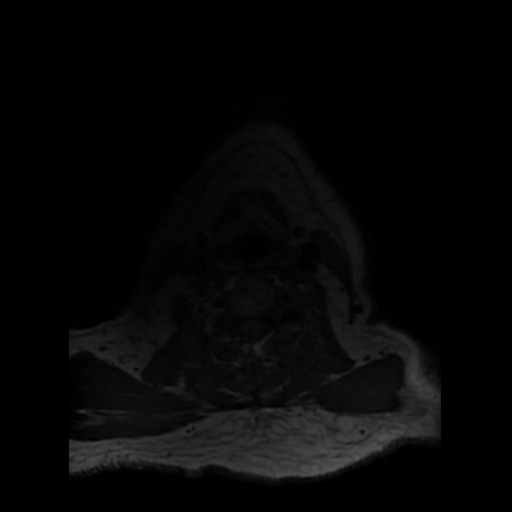
[im 51/301]
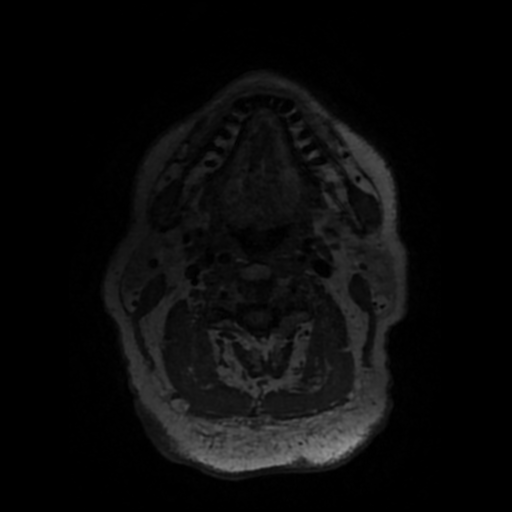

[18 of 48 positions shown; findings below may reference images not displayed]

FINDINGS: Brain: A small linear needle biopsy tract through the superior left
frontal lobe is stable and terminates at the left basal ganglia as
before. However, the masslike, confluent T2 and FLAIR hyperintensity
seen throughout the left basal ganglia last month has substantially
regressed, along with largely resolved mass effect on the adjacent
left lateral ventricle (series 4, image 36 today versus series 6,
image [PHONE_NUMBER]. Furthermore, associated patchy and nodular
postcontrast enhancement has also regressed (series [LP], image 183
today versus series 12, image [PHONE_NUMBER]).

No new or increased areas of enhancement, T2, or FLAIR
hyperintensity.

SWI imaging again suggests petechial hemorrhage along the inferior
margin of the area, which was also slightly hyperdense on the
presentation head CT [DATE].

Diffusion in the area has been facilitated since the presentation
MRI, and the DWI abnormality is also fading now.

No restricted diffusion to suggest acute infarction. No midline
shift, ventriculomegaly, extra-axial collection or acute
intracranial hemorrhage. Gray and white matter signal elsewhere in
the brain is stable since [REDACTED] and within normal limits for age.
No other hemosiderin. No dural thickening. Cervicomedullary junction
and pituitary are within normal limits.

Vascular: Major intracranial vascular flow voids are stable.

Skull and upper cervical spine: Negative visible cervical spine,
bone marrow signal.

Sinuses/Orbits: Stable, negative.

Other: Mastoids remain clear. Grossly normal visible internal
auditory structures. No acute orbit or scalp soft tissue finding.
IMPRESSION: Substantial regression of the abnormal signal, mass effect, and
enhancement in the left basal ganglia. Evidence of underlying
petechial blood is stable since presentation, and diffusion has been
facilitated. No new lesion or abnormality.

This constellation of clinical and imaging findings favors a
resolving subacute small-vessel hemorrhage and/or infarct.
A resolving encephalitis or other CNS inflammatory process is
possible but less likely.

ADDENDUM:
This series of exams were reviewed and discussed at Brain Tumor
Conference this morning.

The patient has been on steroids, so one additional consideration
would be regression of a CNS Lymphoma, but we discussed that the
lesion's MRI characteristics have been not typical of that etiology.
So I continue to favor a non-neoplastic lesion, but recommend
continued MRI surveillance.

*** End of Addendum ***
FINDINGS: Brain: A small linear needle biopsy tract through the superior left
frontal lobe is stable and terminates at the left basal ganglia as
before. However, the masslike, confluent T2 and FLAIR hyperintensity
seen throughout the left basal ganglia last month has substantially
regressed, along with largely resolved mass effect on the adjacent
left lateral ventricle (series 4, image 36 today versus series 6,
image [PHONE_NUMBER]. Furthermore, associated patchy and nodular
postcontrast enhancement has also regressed (series [LP], image 183
today versus series 12, image [PHONE_NUMBER]).

No new or increased areas of enhancement, T2, or FLAIR
hyperintensity.

SWI imaging again suggests petechial hemorrhage along the inferior
margin of the area, which was also slightly hyperdense on the
presentation head CT [DATE].

Diffusion in the area has been facilitated since the presentation
MRI, and the DWI abnormality is also fading now.

No restricted diffusion to suggest acute infarction. No midline
shift, ventriculomegaly, extra-axial collection or acute
intracranial hemorrhage. Gray and white matter signal elsewhere in
the brain is stable since [REDACTED] and within normal limits for age.
No other hemosiderin. No dural thickening. Cervicomedullary junction
and pituitary are within normal limits.

Vascular: Major intracranial vascular flow voids are stable.

Skull and upper cervical spine: Negative visible cervical spine,
bone marrow signal.

Sinuses/Orbits: Stable, negative.

Other: Mastoids remain clear. Grossly normal visible internal
auditory structures. No acute orbit or scalp soft tissue finding.
IMPRESSION: Substantial regression of the abnormal signal, mass effect, and
enhancement in the left basal ganglia. Evidence of underlying
petechial blood is stable since presentation, and diffusion has been
facilitated. No new lesion or abnormality.

This constellation of clinical and imaging findings favors a
resolving subacute small-vessel hemorrhage and/or infarct.
A resolving encephalitis or other CNS inflammatory process is
possible but less likely.

## 2020-04-30 MED ORDER — GADOBUTROL 1 MMOL/ML IV SOLN
9.5000 mL | Freq: Once | INTRAVENOUS | Status: AC | PRN
  Administered 2020-04-30: 9.5 mL via INTRAVENOUS

## 2020-05-03 ENCOUNTER — Inpatient Hospital Stay (HOSPITAL_BASED_OUTPATIENT_CLINIC_OR_DEPARTMENT_OTHER): Payer: Medicare Other | Admitting: Internal Medicine

## 2020-05-03 ENCOUNTER — Encounter (HOSPITAL_COMMUNITY): Admission: RE | Payer: Self-pay | Source: Home / Self Care

## 2020-05-03 ENCOUNTER — Inpatient Hospital Stay (HOSPITAL_COMMUNITY): Admission: RE | Admit: 2020-05-03 | Payer: Medicare Other | Source: Home / Self Care | Admitting: Neurological Surgery

## 2020-05-03 DIAGNOSIS — D496 Neoplasm of unspecified behavior of brain: Secondary | ICD-10-CM

## 2020-05-03 SURGERY — FRAMELESS BIOPSY WITH BRAINLAB
Anesthesia: General

## 2020-05-03 NOTE — Progress Notes (Signed)
I connected with Cory Mathis on 05/03/20 at 11:30 AM EST by telephone visit and verified that I am speaking with the correct person using two identifiers.  I discussed the limitations, risks, security and privacy concerns of performing an evaluation and management service by telemedicine and the availability of in-person appointments. I also discussed with the patient that there may be a patient responsible charge related to this service. The patient expressed understanding and agreed to proceed.  Other persons participating in the visit and their role in the encounter:  spouse  Patient's location:  Home  Provider's location:  Office  Chief Complaint:  Brain tumor [facility])  History of Present Ilness: Cory Mathis was contacted today regarding his recent MRI findings.  He describes no new or progressive deficits.  Headaches have remained mild despite lowering decadron to 2mg  daily.  Observations: Language and cognition at baseline  Imaging:  [place] Clinician Interpretation: I have personally reviewed the CNS images as listed.  My interpretation, in the context of the patient's clinical presentation, is stable disease  CT HEAD WO CONTRAST  Result Date: 04/06/2020 CLINICAL DATA:  Status post brain biopsy EXAM: CT HEAD WITHOUT CONTRAST TECHNIQUE: Contiguous axial images were obtained from the base of the skull through the vertex without intravenous contrast. COMPARISON:  03/25/2020 FINDINGS: Brain: Small amount of air and hemorrhage at left basal ganglia biopsy site. Mild surrounding edema. No midline shift. New focus of hypodensity within the lower pons. Vascular: No hyperdense vessel or unexpected calcification. Skull: Normal. Negative for fracture or focal lesion. Sinuses/Orbits: No acute finding. Other: None. IMPRESSION: 1. Small amount of air and hemorrhage at left basal ganglia biopsy site. 2. New focus of hypodensity within the lower pons, concerning for acute or subacute infarct. Electronically  Signed   By: Ulyses Jarred M.D.   On: 04/06/2020 22:25   MR BRAIN WO CONTRAST  Result Date: 04/07/2020 CLINICAL DATA:  Status post brain biopsy. EXAM: MRI HEAD WITHOUT CONTRAST TECHNIQUE: Multiplanar, multiecho pulse sequences of the brain and surrounding structures were obtained without intravenous contrast. COMPARISON:  CT head 04/06/2020, MRI 03/30/2020 FINDINGS: Brain: When comparing across modalities, similar appearance of a small amount of hemorrhage and gas at the site of biopsy in the left basal ganglia. Small linear biopsy tract through the frontal lobe with a small amount hemorrhage along the tract. When comparing to prior MRI, similar appearance of an infiltrating mass lesion centered at the left basal ganglia with abnormal T2/FLAIR radiating into surrounding white matter tracks, as detailed on 03/31/2019. no acute infarct. No hydrocephalus. No extra-axial fluid collection. Vascular: Major arterial flow voids are maintained at the skull base. Skull and upper cervical spine: Left frontal burr hole from recent biopsy Sinuses/Orbits: Mild ethmoid air cell mucosal thickening. No air-fluid levels. Unremarkable orbits. Other: None. IMPRESSION: 1. When comparing across modalities, similar postsurgical changes at the biopsy site and similar infiltrating mass centered at the left basal ganglia, as detailed above. 2. No evidence of new acute abnormality.  No acute infarct. Electronically Signed   By: Margaretha Sheffield MD   On: 04/07/2020 11:18   MR BRAIN W WO CONTRAST  Addendum Date: 05/03/2020   ADDENDUM REPORT: 05/03/2020 08:33 ADDENDUM: This series of exams were reviewed and discussed at Brain Tumor Conference this morning. The patient has been on steroids, so one additional consideration would be regression of a CNS Lymphoma, but we discussed that the lesion's MRI characteristics have been not typical of that etiology. So I continue to favor a  non-neoplastic lesion, but recommend continued MRI  surveillance. Electronically Signed   By: Genevie Ann M.D.   On: 05/03/2020 08:33   Result Date: 05/03/2020 CLINICAL DATA:  69 year old male with nonenhancing masslike T2 and FLAIR hyperintense lesion in the left hemisphere status post needle biopsy in October with indeterminate pathology results (hypercellular brain tissue with inflammatory infiltrate). Stereotactic planning for re-biopsy. EXAM: MRI HEAD WITHOUT AND WITH CONTRAST TECHNIQUE: Multiplanar, multiecho pulse sequences of the brain and surrounding structures were obtained without and with intravenous contrast. CONTRAST:  9.19mL GADAVIST GADOBUTROL 1 MMOL/ML IV SOLN COMPARISON:  Post biopsy MRI 04/07/2020.  Pre biopsy MRI 03/30/2020. FINDINGS: Brain: A small linear needle biopsy tract through the superior left frontal lobe is stable and terminates at the left basal ganglia as before. However, the masslike, confluent T2 and FLAIR hyperintensity seen throughout the left basal ganglia last month has substantially regressed, along with largely resolved mass effect on the adjacent left lateral ventricle (series 4, image 36 today versus series 6, image [id number]. Furthermore, associated patchy and nodular postcontrast enhancement has also regressed (series 1100, image 183 today versus series 12, image [id number]). No new or increased areas of enhancement, T2, or FLAIR hyperintensity. SWI imaging again suggests petechial hemorrhage along the inferior margin of the area, which was also slightly hyperdense on the presentation head CT 03/25/2020. Diffusion in the area has been facilitated since the presentation MRI, and the DWI abnormality is also fading now. No restricted diffusion to suggest acute infarction. No midline shift, ventriculomegaly, extra-axial collection or acute intracranial hemorrhage. [place] Cables and white matter signal elsewhere in the brain is stable since October and within normal limits for age. No other hemosiderin. No dural thickening. Cervicomedullary  junction and pituitary are within normal limits. Vascular: Major intracranial vascular flow voids are stable. Skull and upper cervical spine: Negative visible cervical spine, bone marrow signal. Sinuses/Orbits: Stable, negative. Other: Mastoids remain clear. Grossly normal visible internal auditory structures. No acute orbit or scalp soft tissue finding. IMPRESSION: Substantial regression of the abnormal signal, mass effect, and enhancement in the left basal ganglia. Evidence of underlying petechial blood is stable since presentation, and diffusion has been facilitated. No new lesion or abnormality. This constellation of clinical and imaging findings favors a resolving subacute small-vessel hemorrhage and/or infarct. A resolving encephalitis or other CNS inflammatory process is possible but less likely. Electronically Signed: By: Genevie Ann M.D. On: 05/02/2020 12:06    Assessment and Plan: Brain tumor [facility])  Cory Mathis presents today with markedly improved MRI not consistent with glioma process.  Etiology is unclear, either monophasic inflammatory syndrome or residual infarct with unusual imaging characteristics.  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 Case was discussed in detail with neuroradiology at brain/spine tumor board this AM.    We recommended deferring biopsy and repeating brain MRI in 2 months.  Decadron should be decreased to 1mg  daily x5 days, then discontinued.   Follow Up Instructions: RTC following next MRI.  I discussed the assessment and treatment plan with the patient.  The patient was provided an opportunity to ask questions and all were answered.  The patient agreed with the plan and demonstrated understanding of the instructions.    The patient was advised to call back or seek an in-person evaluation if the symptoms worsen or if the condition fails to improve as anticipated.  I provided 5-10 minutes of non-face-to-face time during this enocunter.  [name],  MD   I provided 15 minutes of non  face-to-face telephone visit time during this encounter, and > 50% was spent counseling as documented under my assessment & plan.

## 2020-05-05 ENCOUNTER — Other Ambulatory Visit: Payer: Self-pay | Admitting: Radiation Therapy

## 2020-05-07 ENCOUNTER — Inpatient Hospital Stay: Payer: Medicare Other

## 2020-05-12 ENCOUNTER — Telehealth: Payer: Self-pay | Admitting: *Deleted

## 2020-05-12 NOTE — Telephone Encounter (Signed)
Cory Mathis reports that he completed his steroids on Saturday. 2 days ago he began breaking out on his cheeks, neck, and back of neck. Denies any itching or fever. Wants to know if there is anything he can do for his.

## 2020-05-12 NOTE — Telephone Encounter (Signed)
Notified to use hydrocortisone 1% topical twice per day until improves. To call us back if any problems

## 2020-05-14 ENCOUNTER — Other Ambulatory Visit: Payer: Medicare Other

## 2020-05-17 ENCOUNTER — Ambulatory Visit: Payer: Medicare Other | Admitting: Internal Medicine

## 2020-05-17 ENCOUNTER — Institutional Professional Consult (permissible substitution): Payer: Medicare Other | Admitting: Neurology

## 2020-05-17 ENCOUNTER — Telehealth: Payer: Self-pay | Admitting: *Deleted

## 2020-05-17 MED ORDER — HYDROCORTISONE 20 MG PO TABS: 20.0000 mg | ORAL_TABLET | Freq: Every day | ORAL | 0 refills | Status: DC

## 2020-05-17 NOTE — Addendum Note (Signed)
Addended by: [name] on: 05/17/2020 09:14 AM   Modules accepted: Orders

## 2020-05-17 NOTE — Telephone Encounter (Signed)
We can do hydrocortisone 20mg  in AM.  I will place order.  [name], MD

## 2020-05-17 NOTE — Telephone Encounter (Signed)
Patient called to report continued rash on face & neck.  No worse and no better with hydrocortisone cream.  States it started after ending steroid.  Patient is needing further advice.  Routed to Dr Mickeal Skinner.

## 2020-05-24 ENCOUNTER — Telehealth: Payer: Self-pay | Admitting: *Deleted

## 2020-05-24 NOTE — Telephone Encounter (Signed)
Patient called to advise that rash has improved with hydrocortisone.  He is asking if he can begin to decrease that dose.  He was under the impression that the goal was to get off of all steroids before his MRI to see if inflammation was resolved.   Next MRI is scheduled for 07/02/2020.    Routed to Dr Mickeal Skinner to advise how to proceed.

## 2020-05-24 NOTE — Telephone Encounter (Signed)
Per Dr. Mickeal Skinner patient can discontinue the Hydrocortisone, no need to taper.  Instructed patient to let us know if any symptoms reappear and we will readdress at that time.

## 2020-05-25 DIAGNOSIS — D2261 Melanocytic nevi of right upper limb, including shoulder: Secondary | ICD-10-CM | POA: Diagnosis not present

## 2020-05-25 DIAGNOSIS — L820 Inflamed seborrheic keratosis: Secondary | ICD-10-CM | POA: Diagnosis not present

## 2020-05-25 DIAGNOSIS — D485 Neoplasm of uncertain behavior of skin: Secondary | ICD-10-CM | POA: Diagnosis not present

## 2020-05-26 DIAGNOSIS — G4700 Insomnia, unspecified: Secondary | ICD-10-CM | POA: Diagnosis not present

## 2020-05-27 ENCOUNTER — Telehealth: Payer: Self-pay | Admitting: *Deleted

## 2020-05-27 ENCOUNTER — Other Ambulatory Visit: Payer: Self-pay | Admitting: *Deleted

## 2020-05-27 MED ORDER — HYDROCORTISONE 20 MG PO TABS
20.0000 mg | ORAL_TABLET | Freq: Every day | ORAL | 0 refills | Status: DC
Start: 2020-05-27 — End: 2020-06-24

## 2020-05-27 NOTE — Telephone Encounter (Signed)
Patient called to report that he was able to successfully come off of the hydrocortisone and no rash has returned.  He did however, state that his headaches have returned daily and severe in nature.  He states he has been taking tylenol three times daily.  He is also concerned that his repeat MRI isn't until January 2022.  Patient feels that MRI should be done sooner than that.  Routed to Dr Mickeal Skinner to please advise.

## 2020-06-03 DIAGNOSIS — Z4802 Encounter for removal of sutures: Secondary | ICD-10-CM | POA: Diagnosis not present

## 2020-06-19 ENCOUNTER — Other Ambulatory Visit: Payer: Self-pay

## 2020-06-19 ENCOUNTER — Emergency Department (HOSPITAL_COMMUNITY): Payer: Medicare Other

## 2020-06-19 ENCOUNTER — Emergency Department (HOSPITAL_COMMUNITY)
Admission: EM | Admit: 2020-06-19 | Discharge: 2020-06-19 | Disposition: A | Discharge status: Home / Self Care | Payer: Medicare Other | Attending: Emergency Medicine | Admitting: Emergency Medicine

## 2020-06-19 ENCOUNTER — Encounter (HOSPITAL_COMMUNITY): Payer: Self-pay | Admitting: *Deleted

## 2020-06-19 DIAGNOSIS — G9389 Other specified disorders of brain: Secondary | ICD-10-CM | POA: Diagnosis not present

## 2020-06-19 DIAGNOSIS — I10 Essential (primary) hypertension: Secondary | ICD-10-CM | POA: Insufficient documentation

## 2020-06-19 DIAGNOSIS — Z79899 Other long term (current) drug therapy: Secondary | ICD-10-CM | POA: Diagnosis not present

## 2020-06-19 DIAGNOSIS — I4819 Other persistent atrial fibrillation: Secondary | ICD-10-CM | POA: Insufficient documentation

## 2020-06-19 DIAGNOSIS — Z87891 Personal history of nicotine dependence: Secondary | ICD-10-CM | POA: Insufficient documentation

## 2020-06-19 DIAGNOSIS — Z20822 Contact with and (suspected) exposure to covid-19: Secondary | ICD-10-CM | POA: Diagnosis not present

## 2020-06-19 DIAGNOSIS — Z7901 Long term (current) use of anticoagulants: Secondary | ICD-10-CM | POA: Insufficient documentation

## 2020-06-19 DIAGNOSIS — R42 Dizziness and giddiness: Secondary | ICD-10-CM

## 2020-06-19 DIAGNOSIS — R29818 Other symptoms and signs involving the nervous system: Secondary | ICD-10-CM | POA: Diagnosis not present

## 2020-06-19 DIAGNOSIS — J3489 Other specified disorders of nose and nasal sinuses: Secondary | ICD-10-CM | POA: Diagnosis not present

## 2020-06-19 LAB — DIFFERENTIAL
Abs Immature Granulocytes: 0.05 10*3/uL (ref 0.00–0.07)
Basophils Absolute: 0.1 10*3/uL (ref 0.0–0.1)
Basophils Relative: 0 %
Eosinophils Absolute: 0 10*3/uL (ref 0.0–0.5)
Eosinophils Relative: 0 %
Immature Granulocytes: 0 %
Lymphocytes Relative: 17 %
Lymphs Abs: 1.9 10*3/uL (ref 0.7–4.0)
Monocytes Absolute: 0.7 10*3/uL (ref 0.1–1.0)
Monocytes Relative: 6 %
Neutro Abs: 9 10*3/uL — ABNORMAL HIGH (ref 1.7–7.7)
Neutrophils Relative %: 77 %

## 2020-06-19 LAB — COMPREHENSIVE METABOLIC PANEL
ALT: 12 U/L (ref 0–44)
AST: 14 U/L — ABNORMAL LOW (ref 15–41)
Albumin: 4.2 g/dL (ref 3.5–5.0)
Alkaline Phosphatase: 80 U/L (ref 38–126)
Anion gap: 10 (ref 5–15)
BUN: 10 mg/dL (ref 8–23)
CO2: 23 mmol/L (ref 22–32)
Calcium: 9.2 mg/dL (ref 8.9–10.3)
Chloride: 102 mmol/L (ref 98–111)
Creatinine, Ser: 0.91 mg/dL (ref 0.61–1.24)
GFR, Estimated: >60 mL/min (ref >60)
Glucose, Bld: 95 mg/dL (ref 70–99)
Potassium: 3.7 mmol/L (ref 3.5–5.1)
Sodium: 135 mmol/L (ref 135–145)
Total Bilirubin: 1 mg/dL (ref 0.3–1.2)
Total Protein: 7.4 g/dL (ref 6.5–8.1)

## 2020-06-19 LAB — CBC
HCT: 40.5 % (ref 39.0–52.0)
Hemoglobin: 14.2 g/dL (ref 13.0–17.0)
MCH: 33.4 pg (ref 26.0–34.0)
MCHC: 35.1 g/dL (ref 30.0–36.0)
MCV: 95.3 fL (ref 80.0–100.0)
Platelets: 273 10*3/uL (ref 150–400)
RBC: 4.25 MIL/uL (ref 4.22–5.81)
RDW: 12.3 % (ref 11.5–15.5)
WBC: 11.7 10*3/uL — ABNORMAL HIGH (ref 4.0–10.5)
nRBC: 0 % (ref 0.0–0.2)

## 2020-06-19 LAB — I-STAT CHEM 8, ED
BUN: 11 mg/dL (ref 8–23)
Calcium, Ion: 1.19 mmol/L (ref 1.15–1.40)
Chloride: 101 mmol/L (ref 98–111)
Creatinine, Ser: 0.8 mg/dL (ref 0.61–1.24)
Glucose, Bld: 89 mg/dL (ref 70–99)
HCT: 42 % (ref 39.0–52.0)
Hemoglobin: 14.3 g/dL (ref 13.0–17.0)
Potassium: 4.2 mmol/L (ref 3.5–5.1)
Sodium: 136 mmol/L (ref 135–145)
TCO2: 26 mmol/L (ref 22–32)

## 2020-06-19 LAB — RAPID URINE DRUG SCREEN, HOSP PERFORMED
Amphetamines: NOT DETECTED
Barbiturates: NOT DETECTED
Benzodiazepines: NOT DETECTED
Cocaine: NOT DETECTED
Opiates: NOT DETECTED
Tetrahydrocannabinol: NOT DETECTED

## 2020-06-19 LAB — URINALYSIS, ROUTINE W REFLEX MICROSCOPIC
Bacteria, UA: NONE SEEN
Bilirubin Urine: NEGATIVE
Glucose, UA: NEGATIVE mg/dL
Ketones, ur: 5 mg/dL — AB
Leukocytes,Ua: NEGATIVE
Nitrite: NEGATIVE
Protein, ur: NEGATIVE mg/dL
Specific Gravity, Urine: 1.018 (ref 1.005–1.030)
pH: 7 (ref 5.0–8.0)

## 2020-06-19 LAB — PROTIME-INR
INR: 1.5 — ABNORMAL HIGH (ref 0.8–1.2)
Prothrombin Time: 17.2 seconds — ABNORMAL HIGH (ref 11.4–15.2)

## 2020-06-19 LAB — CBG MONITORING, ED: Glucose-Capillary: 89 mg/dL (ref 70–99)

## 2020-06-19 LAB — APTT: aPTT: 41 seconds — ABNORMAL HIGH (ref 24–36)

## 2020-06-19 IMAGING — CT CT HEAD W/O CM
3 series · 14 of 47 positions shown, 16 images · non-contrast
Comparison: Head CT dated [DATE].

CLINICAL DATA: 69-year-old male with dizziness. Prior left basal
ganglia biopsy.

EXAM:
CT HEAD WITHOUT CONTRAST
TECHNIQUE: Contiguous axial images were obtained from the base of the skull
through the vertex without intravenous contrast.

[Series 2: head wo · axial · 0.48mm/px · z∈[+1421,+1561]mm · 8 of 34 slices shown, 10 images]
[im 3/34  brain]
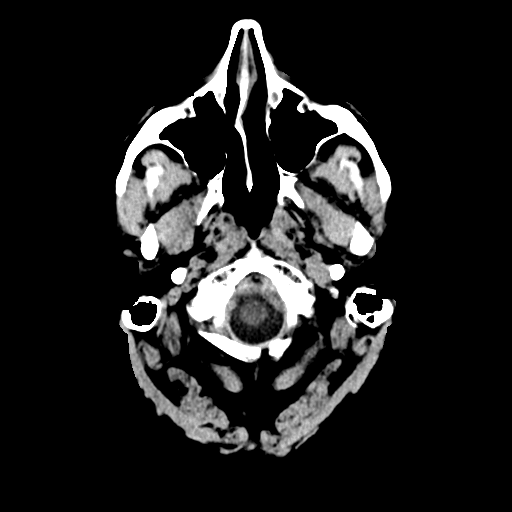
[im 3/34  bone]
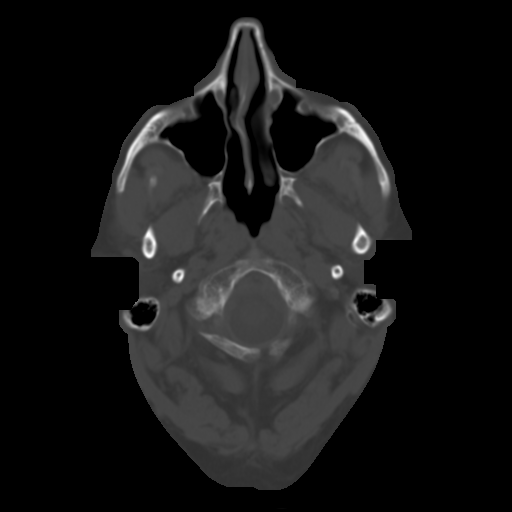
[im 7/34  brain]
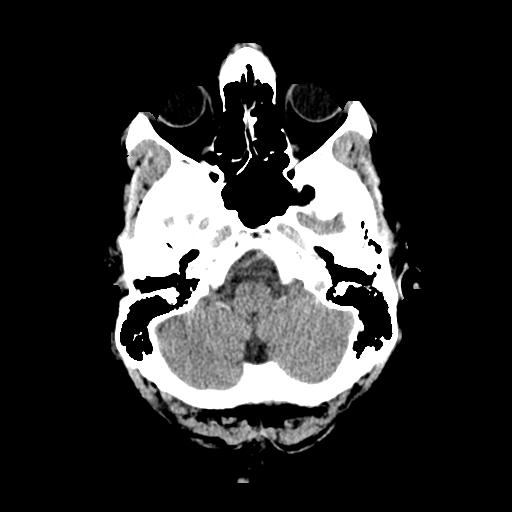
[im 11/34  brain]
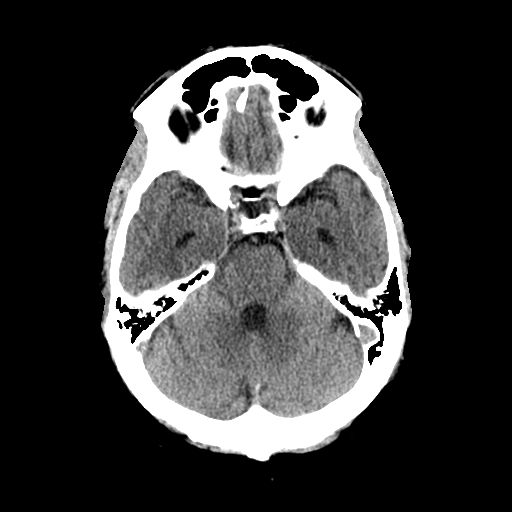
[im 15/34  brain]
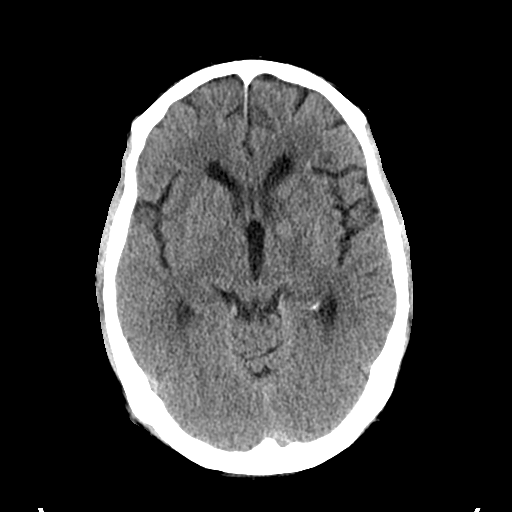
[im 19/34  brain]
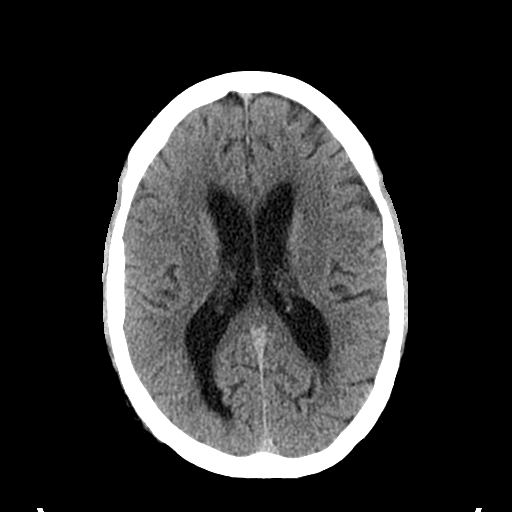
[im 19/34  bone]
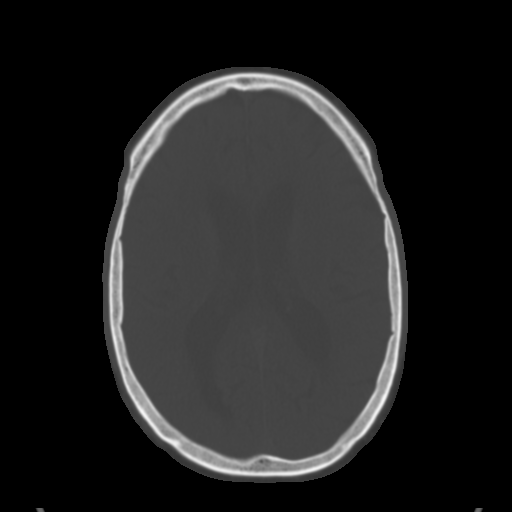
[im 23/34  brain]
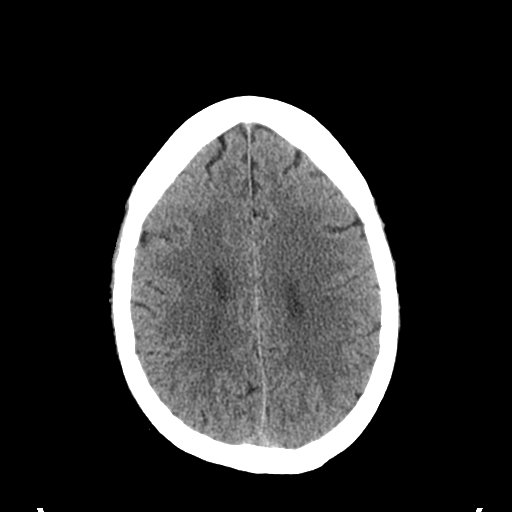
[im 27/34  brain]
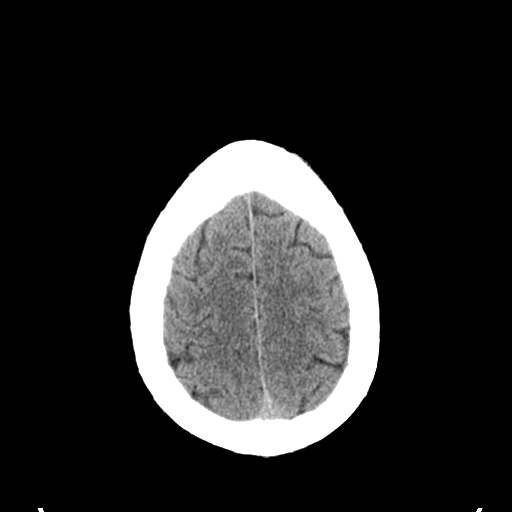
[im 31/34  brain]
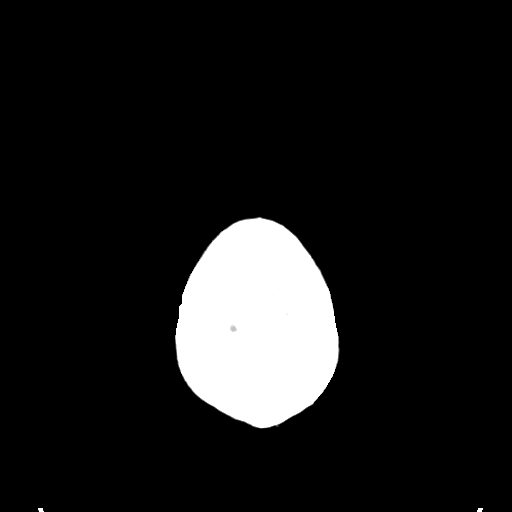

[Series 5: coronal soft tissue · coronal · 0.34mm/px · 3 of 77 slices shown]
[im 26/77  brain]
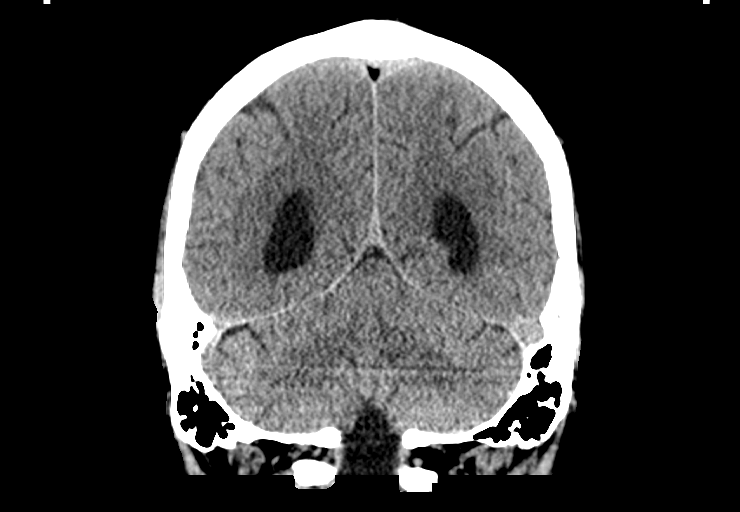
[im 34/77  brain]
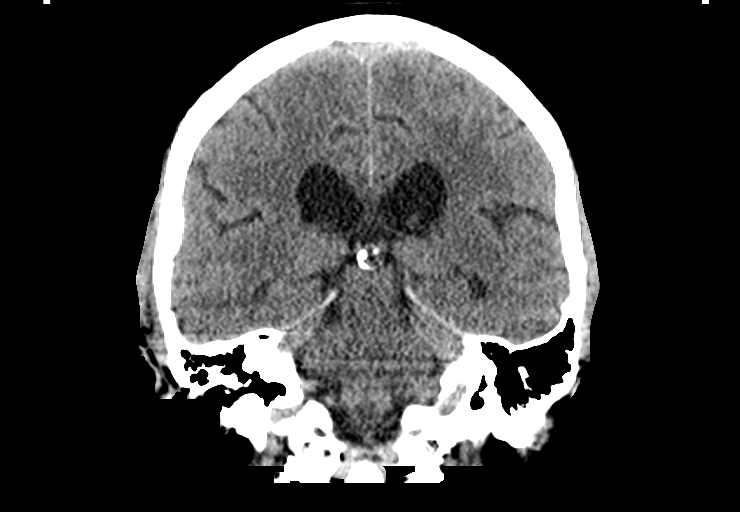
[im 43/77  brain]
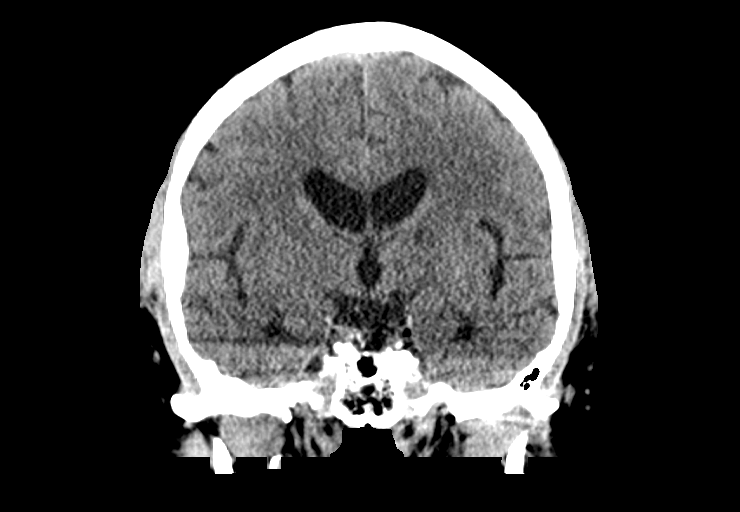

[Series 6: sagittal soft tissue · sagittal · 0.35mm/px · 3 of 61 slices shown]
[im 21/61  brain]
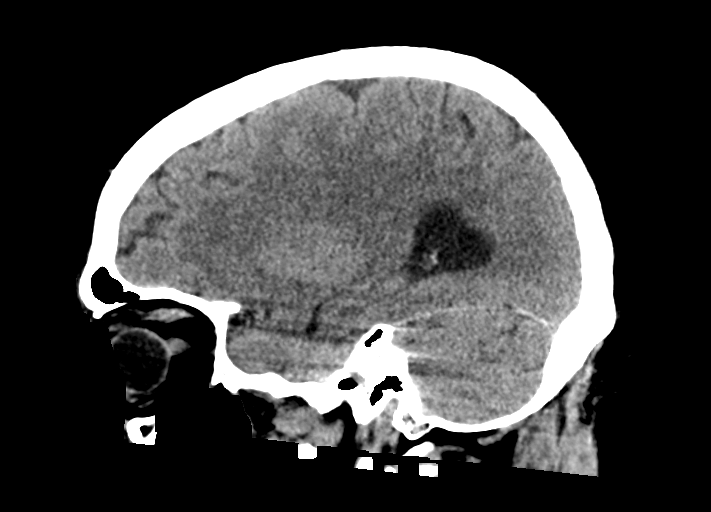
[im 31/61  brain]
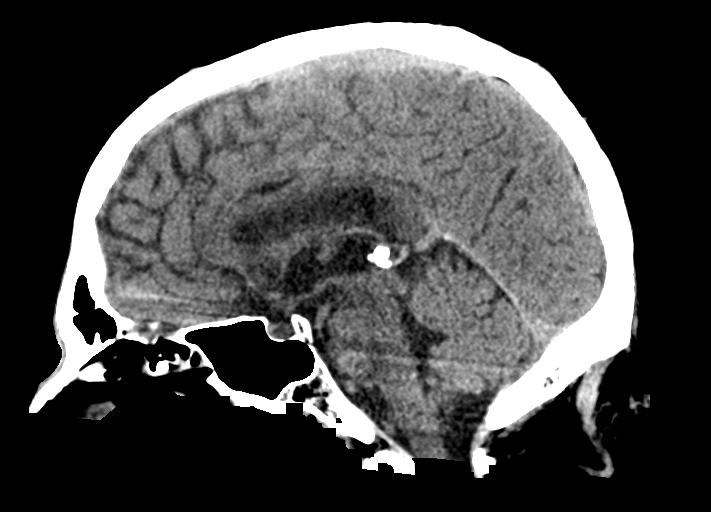
[im 41/61  brain]
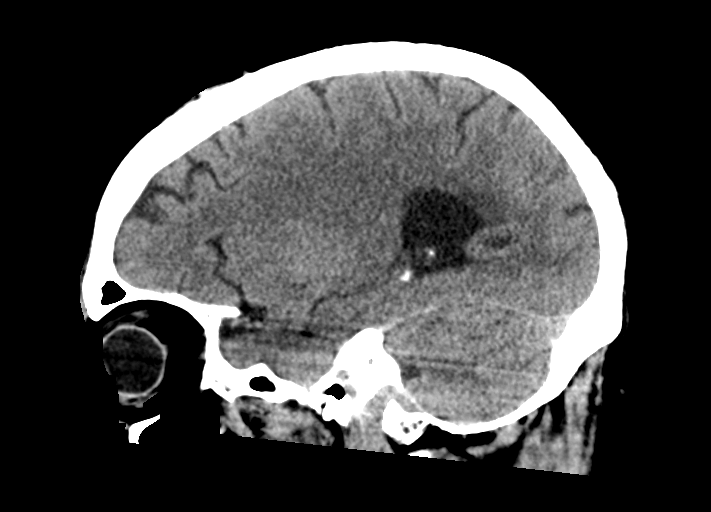

[14 of 47 positions shown; findings below may reference images not displayed]

FINDINGS: Brain: The ventricles and sulci appropriate size for patient's age.
Mild periventricular and deep white matter chronic microvascular
ischemic changes noted. There is a faint 7 mm high attenuating
lesion in the region of the left basal ganglia ([DATE]), present on
the prior CT. There is no acute intracranial hemorrhage. No mass
effect or midline shift. No extra-axial fluid collection.

Vascular: No hyperdense vessel or unexpected calcification.

Skull: Normal. Negative for fracture or focal lesion.

Sinuses/Orbits: No acute finding.

Other: None
IMPRESSION: 1. No acute intracranial pathology.
2. Mild chronic microvascular ischemic changes.
3. A 7 mm faintly hyperdense lesion in the left basal ganglia.

## 2020-06-19 IMAGING — MR MR HEAD W/O CM
15 of 16 series · 42 of 48 positions shown · non-contrast
Comparison: Prior CT from earlier the same day.

CLINICAL DATA: Initial evaluation for neuro deficit, stroke
suspected.

EXAM:
MRI HEAD WITHOUT CONTRAST
TECHNIQUE: Multiplanar, multiecho pulse sequences of the brain and surrounding
structures were obtained without intravenous contrast.

[Series 5: DWI · axial · 3.0mm · 1.36mm/px · z∈[-69,+81]mm · 5 of 104 slices shown (1 of 4)]
[im 1/104]
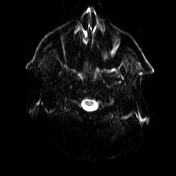
[im 26/104]
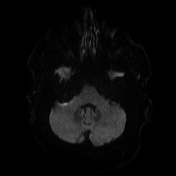
[im 52/104]
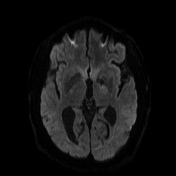
[im 78/104]
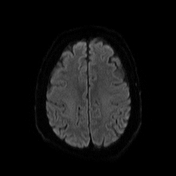
[im 104/104]
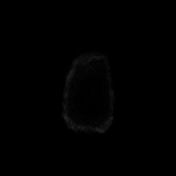

[Series 6: DWI · axial · 3.0mm · 1.36mm/px · z∈[-69,+81]mm · 3 of 51 slices shown (2 of 4)]
[im 1/51]
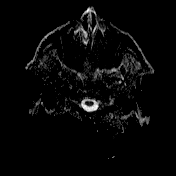
[im 26/51]
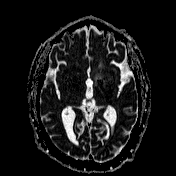
[im 51/51]
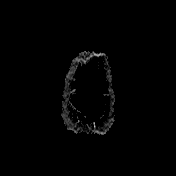

[Series 7: mip_images(sw) · axial · 24.0mm · 0.75mm/px · z∈[-55,+74]mm · 2 of 45 slices shown]
[im 1/45]
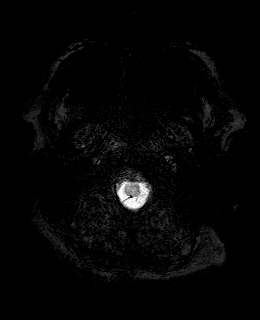
[im 45/45]
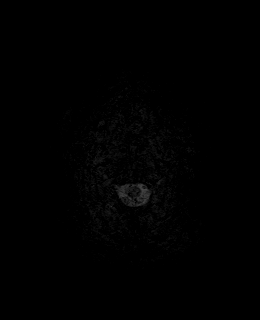

[Series 8: swi_images · axial · 3.0mm · 0.75mm/px · z∈[-66,+85]mm · 3 of 52 slices shown]
[im 1/52]
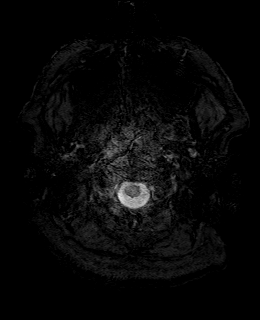
[im 26/52]
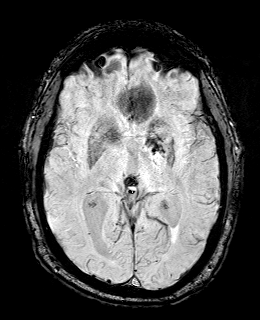
[im 52/52]
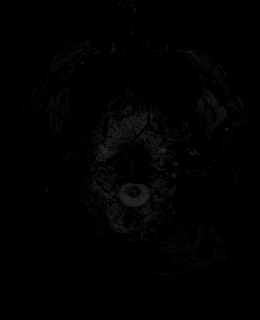

[Series 9: T1 · sagittal · 5.0mm · 0.75mm/px · 1 of 24 slices shown (1 of 2)]
[im 1/24]
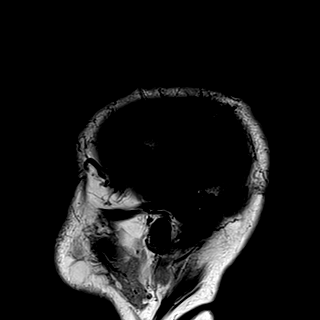

[Series 10: T2 · axial · 5.0mm · 0.62mm/px · 1 of 26 slices shown (1 of 2)]
[im 1/26]
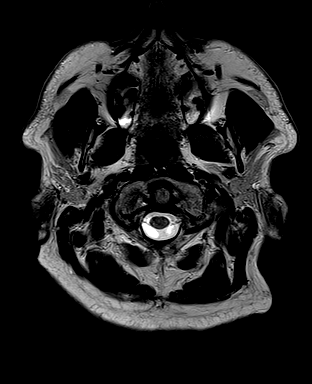

[Series 11: FLAIR · axial · 3.0mm · 0.75mm/px · z∈[-68,+88]mm · 3 of 54 slices shown]
[im 1/54]
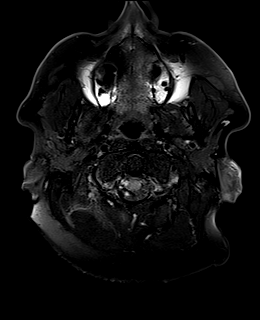
[im 27/54]
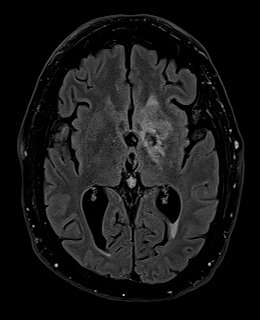
[im 54/54]
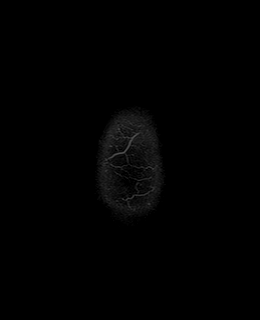

[Series 12: resolve dwi_tracew · axial · 5.0mm · 1.74mm/px · z∈[-75,+78]mm · 2 of 50 slices shown]
[im 1/50]
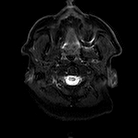
[im 50/50]
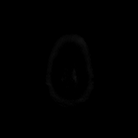

[Series 13: resolve dwi_adc · axial · 5.0mm · 1.74mm/px · 1 of 25 slices shown]
[im 1/25]
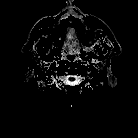

[Series 14: T1 · axial · 1.0mm · 0.94mm/px · z∈[-70,+86]mm · 8 of 160 slices shown (2 of 2)]
[im 1/160]
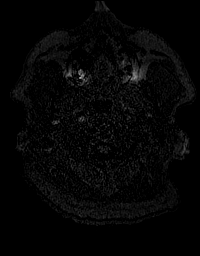
[im 23/160]
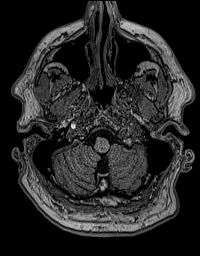
[im 46/160]
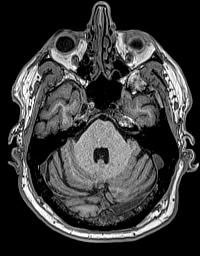
[im 69/160]
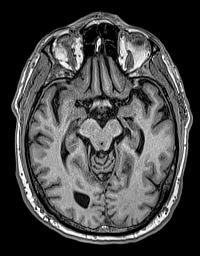
[im 91/160]
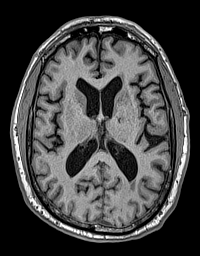
[im 114/160]
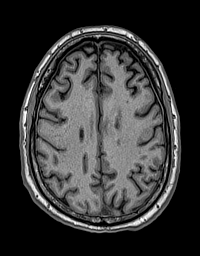
[im 137/160]
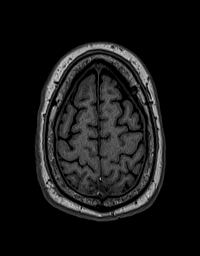
[im 160/160]
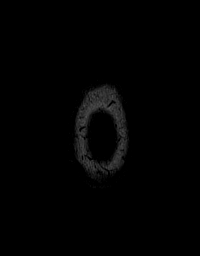

[Series 15: DWI · coronal · 5.0mm · 1.31mm/px · 3 of 68 slices shown (3 of 4)]
[im 1/68]
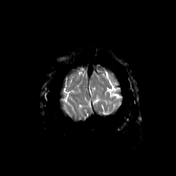
[im 34/68]
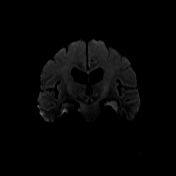
[im 68/68]
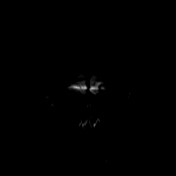

[Series 16: DWI · coronal · 5.0mm · 1.31mm/px · 2 of 34 slices shown (4 of 4)]
[im 1/34]
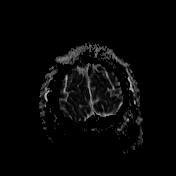
[im 34/34]
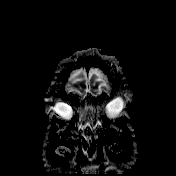

[Series 17: T2 · coronal · 5.0mm · 0.69mm/px · 2 of 34 slices shown (2 of 2)]
[im 1/34]
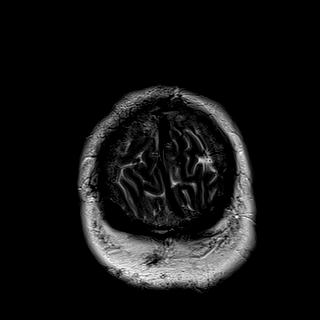
[im 34/34]
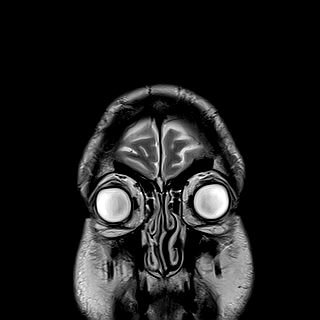

[Series 100: <mpr range> · axial · 1.0mm · 0.50mm/px · z∈[+59,+128]mm · 3 of 65 slices shown]
[im 1/65]
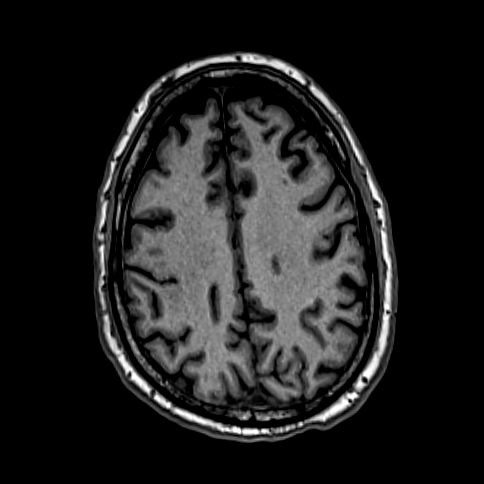
[im 33/65]
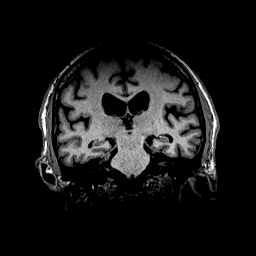
[im 65/65]
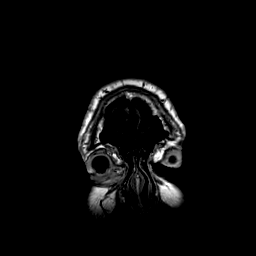

[Series 101: <mpr range(1)> · axial · 1.0mm · 0.50mm/px · z∈[+59,+151]mm · 3 of 54 slices shown]
[im 1/54]
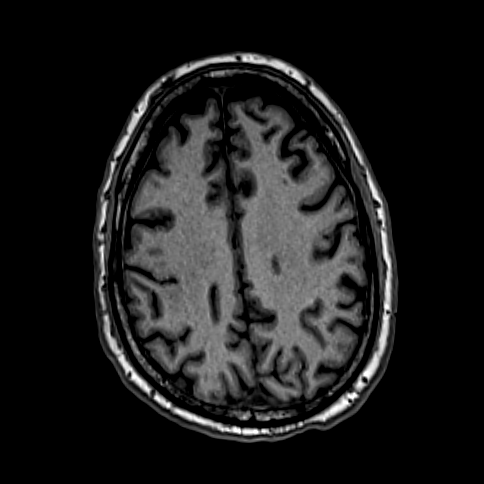
[im 27/54]
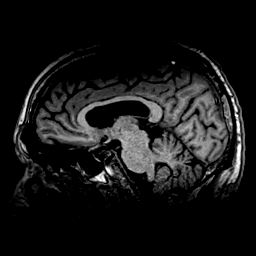
[im 54/54]
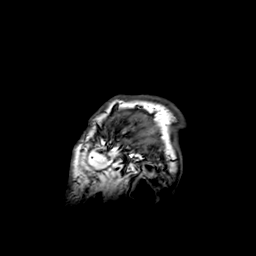

[42 of 48 positions shown; findings below may reference images not displayed]

FINDINGS: Brain: Linear needle biopsy tract again noted extending through the
left frontal lobe, terminating at the left basal ganglia. Previously
described masslike confluent T2/FLAIR hyperintensity involving the
left basal ganglia has slightly worsened and progressed as compared
to previous exam (series 11, image 27 on today's exam versus series
4, image 35 on previous MRI from [DATE] for example). Signal
abnormality slightly superiorly adjacent to the frontal horn of the
left lateral ventricle is slightly improved. No significant regional
mass effect on the adjacent left lateral ventricle. Superimposed
susceptibility artifact is relatively unchanged.

No other new areas of focal signal abnormality. No other evidence
for acute or subacute ischemic infarct. Gray-white matter
differentiation otherwise maintained. No encephalomalacia elsewhere
within the brain. No other evidence for acute or chronic
intracranial hemorrhage.

No other mass lesion, mass effect, or midline shift. Ventricles
stable in size without hydrocephalus. No extra-axial fluid
collection. Pituitary gland and suprasellar region remain within
normal limits. Midline structures intact.

Vascular: Major intracranial vascular flow voids are maintained.

Skull and upper cervical spine: Craniocervical junction within
normal limits. Bone marrow signal intensity normal. No scalp soft
tissue abnormality.

Sinuses/Orbits: Patient status post bilateral ocular lens
replacement. Globes and orbital soft tissues demonstrate no acute
finding. Mild mucosal thickening noted within the ethmoidal air
cells. Paranasal sinuses are otherwise clear. No mastoid effusion.
Inner ear structures grossly normal.

Other: None.
IMPRESSION: 1. Slight interval increase in T2/FLAIR signal abnormality involving
the left basal ganglia as compared to previous MRI from [DATE].
Similar associated susceptibility artifact in blood products. No
significant regional mass effect or midline shift. Again, finding is
indeterminate, but could reflect a persistent area of
encephalitis/cerebritis or other CNS inflammatory process. Changes
related to subacute ischemia felt to be unlikely given the slightly
worsened appearance of this finding as compared to previous. Further
evaluation with dedicated postcontrast imaging recommended for
complete characterization and direct comparative purposes.
2. No other acute intracranial abnormality.

## 2020-06-19 MED ORDER — DEXAMETHASONE 4 MG PO TABS
8.0000 mg | ORAL_TABLET | Freq: Once | ORAL | Status: AC
  Administered 2020-06-19: 8 mg via ORAL
  Filled 2020-06-19: qty 2

## 2020-06-19 MED ORDER — SODIUM CHLORIDE 0.9 % IV BOLUS
500.0000 mL | Freq: Once | INTRAVENOUS | Status: AC
  Administered 2020-06-19: 500 mL via INTRAVENOUS

## 2020-06-19 MED ORDER — RIVAROXABAN 20 MG PO TABS
20.0000 mg | ORAL_TABLET | Freq: Every day | ORAL | Status: DC
Start: 2020-06-19 — End: 2020-06-20
  Administered 2020-06-19: 20 mg via ORAL
  Filled 2020-06-19: qty 1

## 2020-06-19 MED ORDER — SODIUM CHLORIDE 0.9 % IV SOLN
100.0000 mL/h | INTRAVENOUS | Status: DC
  Administered 2020-06-19: 100 mL/h via INTRAVENOUS

## 2020-06-19 MED ORDER — DEXAMETHASONE 4 MG PO TABS: 8.0000 mg | ORAL_TABLET | Freq: Two times a day (BID) | ORAL | 0 refills | Status: AC

## 2020-06-19 MED ORDER — ACETAMINOPHEN 500 MG PO TABS
1000.0000 mg | ORAL_TABLET | Freq: Once | ORAL | Status: AC
  Administered 2020-06-19: 1000 mg via ORAL
  Filled 2020-06-19: qty 2

## 2020-06-19 NOTE — Discharge Instructions (Signed)
As discussed, it is important that you monitor your condition carefully and do not hesitate to return here.  Otherwise, follow-up with your oncologist on Monday.

## 2020-06-19 NOTE — ED Triage Notes (Addendum)
Pt was walking down hall about an hour ago and started to lean to the left side, pt was diagnosed with brain tumor in Oct. Pt shows no physical deficits in triage. States he does have a headache

## 2020-06-19 NOTE — ED Notes (Signed)
Patient received dinner tray 

## 2020-06-19 NOTE — ED Notes (Signed)
Wife asking about pt having tylenol and eating. Informed would ask Dr Vanita Panda.  Verbal ok from Dr Vanita Panda for both.

## 2020-06-19 NOTE — ED Provider Notes (Signed)
[facility] DEPT Provider Note   CSN: [id number] Arrival date & time: 06/19/20  1351     History Chief Complaint  Patient presents with  . Balance problem  . Headache    Cory Mathis is a 70 y.o. male.  HPI Patient presents with his wife who assists with the HPI. He has a history of intracranial abnormality, tumor versus stroke, has been following up with his neurosurgeon.  He notes that he completed a course of steroids, and had been doing generally well with planned outpatient follow-up in 3 weeks.  Today, about 3 hours prior to ED arrival patient had an episode of lightheadedness, abnormal gait, and weakness to the point where he fell to the floor. He was reportedly walking with a left tilt, but this resolved, as did his weakness prior to ED arrival. Currently he denies pain, weakness, confusion, speech difficulty. Wife notes the patient does have some degree of confusion, but this is unchanged.     Past Medical History:  Diagnosis Date  . BPH (benign prostatic hyperplasia)   . Diverticulosis of colon   . Dysrhythmia 2019   Afib  . GERD (gastroesophageal reflux disease)    prn med. control  . History of adenomatous polyp of colon    03-29-2007  tubular adenoma/  08-07-2016  hyperplastic and tubular adenoma's  . History of alcoholic gastritis    [id number]  gastritis, esophagitis, peptic duodenitis  . History of colonic diverticulitis    01-31-2016  resolved w/ medications  . History of Helicobacter pylori infection    10-30-2008  . 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 Hypertension   . Internal hemorrhoids   . Memory loss   . OSA on CPAP    CPAP nightly  . Right thyroid nodule   . Urinary retention 12/13/2016    Patient Active Problem List   Diagnosis Date Noted  . Brain tumor ([place]) 04/06/2020  . Other persistent atrial fibrillation ([place])   . Bradycardia 02/15/2018  .  Agatston coronary artery calcium score between 100 and 199 02/15/2018  . FUO (fever of unknown origin) 08/16/2017  . Goiter 08/16/2017  . Status post thyroidectomy 08/16/2017  . Hypertension 08/16/2017  . Dyslipidemia 08/16/2017  . Obstructive sleep apnea 08/16/2017  . Memory loss 07/25/2017  . Enlarged prostate with urinary obstruction 01/04/2017  . Prolapsed internal hemorrhoids, grade 3 09/13/2016  . Eustachian tube dysfunction, bilateral 05/19/2016    Past Surgical History:  Procedure Laterality Date  . APPLICATION OF CRANIAL NAVIGATION N/A 04/06/2020   Procedure: APPLICATION OF CRANIAL NAVIGATION;  Surgeon: Judith Part, MD;  Location: [facility];  Service: Neurosurgery;  Laterality: N/A;  . BRAIN SURGERY    . CARDIOVERSION N/A 12/23/2018   Procedure: CARDIOVERSION;  Surgeon: Buford Dresser, MD;  Location: [facility] ENDOSCOPY;  Service: Cardiovascular;  Laterality: N/A;  . COLONOSCOPY  last one 08-07-2016  . EYE SURGERY    . FRAMELESS  BIOPSY WITH BRAINLAB Left 04/06/2020   Procedure: Left Stereotactic brain biopsy with brainlab;  Surgeon: Judith Part, MD;  Location: [facility];  Service: Neurosurgery;  Laterality: Left;  . HEMORRHOID BANDING  10-20-2016;  09-11-2016  . HERNIA REPAIR    . KNEE ARTHROSCOPY Bilateral    10 + yrs ago  . LASIK Bilateral   . PILONIDAL CYST EXCISION    . THYROIDECTOMY Left 2013  . TRANSURETHRAL RESECTION OF PROSTATE N/A 01/04/2017   Procedure: TRANSURETHRAL  RESECTION OF THE PROSTATE (TURP);  Surgeon: Franchot Gallo, MD;  Location: [facility];  Service: Urology;  Laterality: N/A;  . UPPER GASTROINTESTINAL ENDOSCOPY  10/30/2008  . WISDOM TOOTH EXTRACTION         Family History  Problem Relation Age of Onset  . Heart attack Mother        46's  . Other Father        atherosclerosis - 61's  . Prostate cancer Maternal Grandfather   . Other Sister        brain tumor - unsure if it was cancer - 12's  . Colon cancer Neg  Hx   . Esophageal cancer Neg Hx   . Pancreatic cancer Neg Hx   . Rectal cancer Neg Hx   . Stomach cancer Neg Hx     Social History   Tobacco Use  . Smoking status: Former Research scientist (life sciences)  . Smokeless tobacco: Never Used  Vaping Use  . Vaping Use: Never used  Substance Use Topics  . Alcohol use: Yes    Comment: social   . Drug use: No    Home Medications Prior to Admission medications   Medication Sig Start Date End Date Taking? Authorizing Provider  acetaminophen (TYLENOL) 500 MG tablet Take 1,000 mg by mouth 2 (two) times daily as needed for headache.   Yes [provider]  amLODipine (NORVASC) 5 MG tablet TAKE 1 TABLET(5 MG) BY MOUTH TWICE DAILY Patient taking differently: Take 5 mg by mouth daily. 03/31/20  Yes Fay Records, MD  carboxymethylcellulose (REFRESH PLUS) 0.5 % SOLN Place 1 drop into both eyes 4 (four) times daily as needed (dry eyes).   Yes [provider]  eszopiclone (LUNESTA) 2 MG TABS tablet Take 2 mg by mouth at bedtime. 05/26/20  Yes [provider]  rosuvastatin (CRESTOR) 5 MG tablet TAKE 1/2 TABLET(2.5 MG) BY MOUTH DAILY Patient taking differently: Take 2.5 mg by mouth daily. 01/01/20  Yes Fay Records, MD  XARELTO 20 MG TABS tablet TAKE 1 TABLET(20 MG) BY MOUTH DAILY WITH SUPPER Patient taking differently: Take 20 mg by mouth daily with supper. 04/28/20  Yes Fay Records, MD  dexamethasone (DECADRON) 2 MG tablet Take 1 tablet (2 mg total) by mouth daily. Patient not taking: No sig reported 04/29/20   [name], MD  hydrocortisone (CORTEF) 20 MG tablet Take 1 tablet (20 mg total) by mouth daily. Patient not taking: No sig reported 05/27/20   [name], MD    Allergies    Other, Sulfa antibiotics, Sulfasalazine, and Metronidazole  Review of Systems   Review of Systems  Constitutional:       Per HPI, otherwise negative  HENT:       Per HPI, otherwise negative  Respiratory:       Per HPI, otherwise negative   Cardiovascular:       Per HPI, otherwise negative  Gastrointestinal: Negative for vomiting.  Endocrine:       Negative aside from HPI  Genitourinary:       Neg aside from HPI   Musculoskeletal:       Per HPI, otherwise negative  Skin: Negative.   Neurological: Positive for dizziness, weakness and light-headedness. Negative for syncope and facial asymmetry.    Physical Exam Updated Vital Signs BP (!) 130/57   Pulse (!) 55   Temp 98.2 F (36.8 C) (Oral)   Resp 15   Ht 5\' 10"  (1.778 m)   Wt  93.4 kg   SpO2 97%   BMI 29.56 kg/m   Physical Exam Vitals and nursing note reviewed.  Constitutional:      General: He is not in acute distress.    Appearance: He is well-developed.  HENT:     Head: Normocephalic and atraumatic.  Eyes:     Extraocular Movements: EOM normal.     Conjunctiva/sclera: Conjunctivae normal.  Cardiovascular:     Rate and Rhythm: Normal rate and regular rhythm.  Pulmonary:     Effort: Pulmonary effort is normal. No respiratory distress.     Breath sounds: No stridor.  Abdominal:     General: There is no distension.  Musculoskeletal:        General: No edema.  Skin:    General: Skin is warm and dry.  Neurological:     Mental Status: He is alert and oriented to person, place, and time.     Cranial Nerves: No dysarthria or facial asymmetry.     Motor: No weakness, tremor, atrophy, abnormal muscle tone or seizure activity.     Coordination: Coordination normal.  Psychiatric:        Mood and Affect: Mood and affect normal.      ED Results / Procedures / Treatments   Labs (all labs ordered are listed, but only abnormal results are displayed) Labs Reviewed  PROTIME-INR - Abnormal; Notable for the following components:      Result Value   Prothrombin Time 17.2 (*)    INR 1.5 (*)    All other components within normal limits  APTT - Abnormal; Notable for the following components:   aPTT 41 (*)    All other components within normal limits  CBC -  Abnormal; Notable for the following components:   WBC 11.7 (*)    All other components within normal limits  DIFFERENTIAL - Abnormal; Notable for the following components:   Neutro Abs 9.0 (*)    All other components within normal limits  COMPREHENSIVE METABOLIC PANEL - Abnormal; Notable for the following components:   AST 14 (*)    All other components within normal limits  URINALYSIS, ROUTINE W REFLEX MICROSCOPIC - Abnormal; Notable for the following components:   Hgb urine dipstick SMALL (*)    Ketones, ur 5 (*)    All other components within normal limits  SARS CORONAVIRUS 2 (TAT 6-24 HRS)  RAPID URINE DRUG SCREEN, HOSP PERFORMED  CBG MONITORING, ED  I-STAT CHEM 8, ED    Cardiac monitor sinus, 60/70, unremarkable Pulse oximetry 95% room air and unremarkable  Radiology CT HEAD WO CONTRAST  Result Date: 06/19/2020 CLINICAL DATA:  70 year old male with dizziness. Prior left basal ganglia biopsy. EXAM: CT HEAD WITHOUT CONTRAST TECHNIQUE: Contiguous axial images were obtained from the base of the skull through the vertex without intravenous contrast. COMPARISON:  Head CT dated 04/06/2020. FINDINGS: Brain: The ventricles and sulci appropriate size for patient's age. Mild periventricular and deep white matter chronic microvascular ischemic changes noted. There is a faint 7 mm high attenuating lesion in the region of the left basal ganglia (15/2), present on the prior CT. There is no acute intracranial hemorrhage. No mass effect or midline shift. No extra-axial fluid collection. Vascular: No hyperdense vessel or unexpected calcification. Skull: Normal. Negative for fracture or focal lesion. Sinuses/Orbits: No acute finding. Other: None IMPRESSION: 1. No acute intracranial pathology. 2. Mild chronic microvascular ischemic changes. 3. A 7 mm faintly hyperdense lesion in the left basal ganglia. Electronically Signed  By: Anner Crete M.D.   On: 06/19/2020 15:23   MR Brain Wo Contrast (neuro  protocol)  Result Date: 06/19/2020 CLINICAL DATA:  Initial evaluation for neuro deficit, stroke suspected. EXAM: MRI HEAD WITHOUT CONTRAST TECHNIQUE: Multiplanar, multiecho pulse sequences of the brain and surrounding structures were obtained without intravenous contrast. COMPARISON:  Prior CT from earlier the same day. FINDINGS: Brain: Linear needle biopsy tract again noted extending through the left frontal lobe, terminating at the left basal ganglia. Previously described masslike confluent T2/FLAIR hyperintensity involving the left basal ganglia has slightly worsened and progressed as compared to previous exam (series 11, image 27 on today's exam versus series 4, image 35 on previous MRI from 04/30/2020 for example). Signal abnormality slightly superiorly adjacent to the frontal horn of the left lateral ventricle is slightly improved. No significant regional mass effect on the adjacent left lateral ventricle. Superimposed susceptibility artifact is relatively unchanged. No other new areas of focal signal abnormality. No other evidence for acute or subacute ischemic infarct. Gray-white matter differentiation otherwise maintained. No encephalomalacia elsewhere within the brain. No other evidence for acute or chronic intracranial hemorrhage. No other mass lesion, mass effect, or midline shift. Ventricles stable in size without hydrocephalus. No extra-axial fluid collection. Pituitary gland and suprasellar region remain within normal limits. Midline structures intact. Vascular: Major intracranial vascular flow voids are maintained. Skull and upper cervical spine: Craniocervical junction within normal limits. Bone marrow signal intensity normal. No scalp soft tissue abnormality. Sinuses/Orbits: Patient status post bilateral ocular lens replacement. Globes and orbital soft tissues demonstrate no acute finding. Mild mucosal thickening noted within the ethmoidal air cells. Paranasal sinuses are otherwise clear. No  mastoid effusion. Inner ear structures grossly normal. Other: None. IMPRESSION: 1. Slight interval increase in T2/FLAIR signal abnormality involving the left basal ganglia as compared to previous MRI from 04/30/2020. Similar associated susceptibility artifact in blood products. No significant regional mass effect or midline shift. Again, finding is indeterminate, but could reflect a persistent area of encephalitis/cerebritis or other CNS inflammatory process. Changes related to subacute ischemia felt to be unlikely given the slightly worsened appearance of this finding as compared to previous. Further evaluation with dedicated postcontrast imaging recommended for complete characterization and direct comparative purposes. 2. No other acute intracranial abnormality. Electronically Signed   By: Jeannine Boga M.D.   On: 06/19/2020 20:12    Procedures Procedures (including critical care time)  Medications Ordered in ED Medications  sodium chloride 0.9 % bolus 500 mL (0 mLs Intravenous Stopped 06/19/20 1543)    Followed by  0.9 %  sodium chloride infusion (100 mL/hr Intravenous New Bag/Given 06/19/20 1503)  rivaroxaban (XARELTO) tablet 20 mg (20 mg Oral Given 06/19/20 1943)  dexamethasone (DECADRON) tablet 8 mg (has no administration in time range)  acetaminophen (TYLENOL) tablet 1,000 mg (1,000 mg Oral Given 06/19/20 1747)    ED Course  I have reviewed the triage vital signs and the nursing notes.  Pertinent labs & imaging results that were available during my care of the patient were reviewed by me and considered in my medical decision making (see chart for details).  Update: Patient in no distress. CT head reviewed, discussed, basal ganglia lesion, will require MRI.  9:00 PM MRI reviewed, discussed, both the patient, and his wife as well as with our oncologist on-call. Again we discussed the patient's recent history of brain lesion without clear pathology, some suggestion of cerebritis versus  mass.  With this consideration the patient will start steroids, 8 mg,  twice daily, follow-up with his oncologist in 48 hours.  Patient and wife both amenable to this plan, patient appropriate for discharge, absent other alarming findings including evidence for new ischemic event, substantial lab abnormalities. Final Clinical Impression(s) / ED Diagnoses Final diagnoses:  Dizziness  Brain mass   MDM Number of Diagnoses or Management Options Brain mass: established, worsening Dizziness: new, needed workup   Amount and/or Complexity of Data Reviewed Clinical lab tests: reviewed Tests in the radiology section of CPT: reviewed Tests in the medicine section of CPT: reviewed Decide to obtain previous medical records or to obtain history from someone other than the patient: yes Review and summarize past medical records: yes Independent visualization of images, tracings, or specimens: yes  Risk of Complications, Morbidity, and/or Mortality Presenting problems: high Diagnostic procedures: high Management options: high  Critical Care Total time providing critical care: < 30 minutes  Patient Progress Patient progress: stable    Carmin Muskrat, MD 06/20/20 1358

## 2020-06-19 NOTE — ED Notes (Signed)
Family at bedside. 

## 2020-06-19 NOTE — ED Notes (Signed)
Patient transported to CT 

## 2020-06-20 LAB — SARS CORONAVIRUS 2 (TAT 6-24 HRS): SARS Coronavirus 2: NEGATIVE

## 2020-06-21 ENCOUNTER — Telehealth: Payer: Self-pay

## 2020-06-21 NOTE — Telephone Encounter (Signed)
Pt called in and stated he had been in the ER over the weekend. Pt requesting appointment with Dr. Mickeal Skinner per ER MD. Discussed with Dr. Mickeal Skinner and appointment scheduled for Thursday 06/24/20 at 11:00 am. Pt contacted and appointment information given to him. Patient verbalized understand of appointment date and time.

## 2020-06-24 ENCOUNTER — Inpatient Hospital Stay: Payer: Medicare Other | Attending: Internal Medicine | Admitting: Internal Medicine

## 2020-06-24 ENCOUNTER — Other Ambulatory Visit: Payer: Self-pay

## 2020-06-24 VITALS — BP 137/63 | HR 56 | Temp 97.1°F | Resp 18 | Ht 70.0 in | Wt 213.2 lb

## 2020-06-24 DIAGNOSIS — Z79899 Other long term (current) drug therapy: Secondary | ICD-10-CM | POA: Diagnosis not present

## 2020-06-24 DIAGNOSIS — Z7901 Long term (current) use of anticoagulants: Secondary | ICD-10-CM | POA: Diagnosis not present

## 2020-06-24 DIAGNOSIS — Z87891 Personal history of nicotine dependence: Secondary | ICD-10-CM | POA: Insufficient documentation

## 2020-06-24 DIAGNOSIS — R29818 Other symptoms and signs involving the nervous system: Secondary | ICD-10-CM | POA: Diagnosis not present

## 2020-06-24 DIAGNOSIS — Z881 Allergy status to other antibiotic agents status: Secondary | ICD-10-CM | POA: Insufficient documentation

## 2020-06-24 DIAGNOSIS — R220 Localized swelling, mass and lump, head: Secondary | ICD-10-CM | POA: Diagnosis not present

## 2020-06-24 DIAGNOSIS — I6782 Cerebral ischemia: Secondary | ICD-10-CM | POA: Diagnosis not present

## 2020-06-24 DIAGNOSIS — Z8249 Family history of ischemic heart disease and other diseases of the circulatory system: Secondary | ICD-10-CM | POA: Insufficient documentation

## 2020-06-24 DIAGNOSIS — D496 Neoplasm of unspecified behavior of brain: Secondary | ICD-10-CM | POA: Diagnosis not present

## 2020-06-24 DIAGNOSIS — Z8042 Family history of malignant neoplasm of prostate: Secondary | ICD-10-CM | POA: Diagnosis not present

## 2020-06-24 DIAGNOSIS — R519 Headache, unspecified: Secondary | ICD-10-CM | POA: Diagnosis not present

## 2020-06-24 DIAGNOSIS — Z882 Allergy status to sulfonamides status: Secondary | ICD-10-CM | POA: Diagnosis not present

## 2020-06-24 DIAGNOSIS — Z8719 Personal history of other diseases of the digestive system: Secondary | ICD-10-CM | POA: Insufficient documentation

## 2020-06-24 DIAGNOSIS — R42 Dizziness and giddiness: Secondary | ICD-10-CM | POA: Diagnosis not present

## 2020-06-24 DIAGNOSIS — Z86010 Personal history of colonic polyps: Secondary | ICD-10-CM | POA: Diagnosis not present

## 2020-06-24 NOTE — Progress Notes (Signed)
[facility] [place], [place] [address] [phone]   Interval Evaluation  Date of Service: 06/24/20 Patient Name: Cory Mathis Patient MRN: [id number] Patient DOB: 1950/12/12 Provider: [name], MD  Identifying Statement:  Cory Mathis is a 70 y.o. male with left frontal mass   Oncologic History 04/06/20: Stereotactic biopsy by Dr. Zada Finders; path indeterminate  Biomarkers:  MGMT Unknown.  IDH 1/2 Unknown.  EGFR Unknown  TERT Unknown   Interval History:  Cory Mathis presents today after recent ED visit and MRI scan for review.  He describes an episode at home of sudden onset light-headed feeling dizziness, which came upon him after reaching the top of the stairs.  He had to sit/lay down and was "a little out of it" for some seconds, not minutes.  He then returned to normal state of health.  Stroke evaluation was performed in the ED including brain MRI study, and he was discharged with short course of decadron. At present, he feels normal, aside from prior short term memory issues.  He does continue to experience daily headaches, temporal and mild in nature.  He takes Tylenol less than daily.  No sleep issues, but acknowledges CPAP mask has not been refitted, titrated or evaluated on 4-5 years.  H+P (04/16/20) Patient presented to medical attention in October 2021 with several weeks of daily headaches which were new.  Headaches are described as moderate pressure affecting bilateral temples, worse at night.  He also describes some modest impairment of short term memory, with some forgetting of names and dates.  No interference with day to day functioning.  He underwent brain MRI which demonstrated left frontal deep brain mass, which was biopsied on 04/06/20 by Dr. Zada Finders.  Since surgery he continues to have headaches but no other new or progressive changes.  Presents today for pathology review and treatment planning.     Medications: Current Outpatient Medications on File Prior to Visit  Medication Sig Dispense Refill  . acetaminophen (TYLENOL) 500 MG tablet Take 1,000 mg by mouth 2 (two) times daily as needed for headache.    Marland Kitchen amLODipine (NORVASC) 5 MG tablet TAKE 1 TABLET(5 MG) BY MOUTH TWICE DAILY (Patient taking differently: Take 5 mg by mouth daily.) 180 tablet 3  . carboxymethylcellulose (REFRESH PLUS) 0.5 % SOLN Place 1 drop into both eyes 4 (four) times daily as needed (dry eyes).    . eszopiclone (LUNESTA) 2 MG TABS tablet Take 2 mg by mouth at bedtime.    . rosuvastatin (CRESTOR) 5 MG tablet TAKE 1/2 TABLET(2.5 MG) BY MOUTH DAILY (Patient taking differently: Take 2.5 mg by mouth daily.) 45 tablet 1  . XARELTO 20 MG TABS tablet TAKE 1 TABLET(20 MG) BY MOUTH DAILY WITH SUPPER (Patient taking differently: Take 20 mg by mouth daily with supper.) 30 tablet 6   No current facility-administered medications on file prior to visit.    Allergies:  Allergies  Allergen Reactions  . Other     Mints - makes him sneeze  . Sulfa Antibiotics Hives and Rash  . Sulfasalazine Hives and Other (See Comments)    other  . Metronidazole Other (See Comments)    Pt reported he was light headed for 2 weeks.     Past Medical History:  Past Medical History:  Diagnosis Date  . BPH (benign prostatic hyperplasia)   . Diverticulosis of colon   . Dysrhythmia 2019   Afib  . GERD (gastroesophageal reflux disease)  prn med. control  . History of adenomatous polyp of colon    03-29-2007  tubular adenoma/  08-07-2016  hyperplastic and tubular adenoma's  . History of alcoholic gastritis    [id number]  gastritis, esophagitis, peptic duodenitis  . History of colonic diverticulitis    01-31-2016  resolved w/ medications  . History of Helicobacter pylori infection    10-30-2008  . 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 Hypertension   . Internal hemorrhoids   .  Memory loss   . OSA on CPAP    CPAP nightly  . Right thyroid nodule   . Urinary retention 12/13/2016   Past Surgical History:  Past Surgical History:  Procedure Laterality Date  . APPLICATION OF CRANIAL NAVIGATION N/A 04/06/2020   Procedure: APPLICATION OF CRANIAL NAVIGATION;  Surgeon: Judith Part, MD;  Location: [facility];  Service: Neurosurgery;  Laterality: N/A;  . BRAIN SURGERY    . CARDIOVERSION N/A 12/23/2018   Procedure: CARDIOVERSION;  Surgeon: Buford Dresser, MD;  Location: [facility] ENDOSCOPY;  Service: Cardiovascular;  Laterality: N/A;  . COLONOSCOPY  last one 08-07-2016  . EYE SURGERY    . FRAMELESS  BIOPSY WITH BRAINLAB Left 04/06/2020   Procedure: Left Stereotactic brain biopsy with brainlab;  Surgeon: Judith Part, MD;  Location: [facility];  Service: Neurosurgery;  Laterality: Left;  . HEMORRHOID BANDING  10-20-2016;  09-11-2016  . HERNIA REPAIR    . KNEE ARTHROSCOPY Bilateral    10 + yrs ago  . LASIK Bilateral   . PILONIDAL CYST EXCISION    . THYROIDECTOMY Left 2013  . TRANSURETHRAL RESECTION OF PROSTATE N/A 01/04/2017   Procedure: TRANSURETHRAL RESECTION OF THE PROSTATE (TURP);  Surgeon: Franchot Gallo, MD;  Location: [facility];  Service: Urology;  Laterality: N/A;  . UPPER GASTROINTESTINAL ENDOSCOPY  10/30/2008  . WISDOM TOOTH EXTRACTION     Social History:  Social History   Socioeconomic History  . Marital status: Married    Spouse name: Not on file  . Number of children: 0  . Years of education: 16+  . Highest education level: Bachelor's degree (e.g., BA, AB, BS)  Occupational History  . Occupation: Retired  Tobacco Use  . Smoking status: Former Research scientist (life sciences)  . Smokeless tobacco: Never Used  Vaping Use  . Vaping Use: Never used  Substance and Sexual Activity  . Alcohol use: Yes    Comment: social   . Drug use: No  . Sexual activity: Not on file  Other Topics Concern  . Not on file  Social History Narrative   Left-handed.    1.5 cups caffeine per day.   Social Determinants of Health   Financial Resource Strain: Not on file  Food Insecurity: Not on file  Transportation Needs: Not on file  Physical Activity: Not on file  Stress: Not on file  Social Connections: Not on file  Intimate Partner Violence: Not on file   Family History:  Family History  Problem Relation Age of Onset  . Heart attack Mother        67's  . Other Father        atherosclerosis - 63's  . Prostate cancer Maternal Grandfather   . Other Sister        brain tumor - unsure if it was cancer - 10's  . Colon cancer Neg Hx   . Esophageal cancer Neg Hx   . Pancreatic cancer Neg Hx   .  Rectal cancer Neg Hx   . Stomach cancer Neg Hx     Review of Systems: Constitutional: Doesn't report fevers, chills or abnormal weight loss Eyes: Doesn't report blurriness of vision Ears, nose, mouth, throat, and face: Doesn't report sore throat Respiratory: Doesn't report cough, dyspnea or wheezes Cardiovascular: Doesn't report palpitation, chest discomfort  Gastrointestinal:  Doesn't report nausea, constipation, diarrhea GU: Doesn't report incontinence Skin: Doesn't report skin rashes Neurological: Per HPI Musculoskeletal: Doesn't report joint pain Behavioral/Psych: Doesn't report anxiety  Physical Exam: Vitals:   06/24/20 1119  BP: 137/63  Pulse: (!) 56  Resp: 18  Temp: (!) 97.1 F (36.2 C)  SpO2: 98%   KPS: 90. General: Alert, cooperative, pleasant, in no acute distress Head: Normal EENT: No conjunctival injection or scleral icterus.  Lungs: Resp effort normal Cardiac: Regular rate Abdomen: Non-distended abdomen Skin: No rashes cyanosis or petechiae. Extremities: No clubbing or edema  Neurologic Exam: Mental Status: Awake, alert, attentive to examiner. Oriented to self and environment. Language is fluent with intact comprehension.  Cranial Nerves: Visual acuity is grossly normal. Visual fields are full. Extra-ocular movements  intact. No ptosis. Face is symmetric Motor: Tone and bulk are normal. Power is full in both arms and legs. Reflexes are symmetric, no pathologic reflexes present.  Sensory: Intact to light touch Gait: Normal.   Labs: I have reviewed the data as listed    Component Value Date/Time   NA 136 06/19/2020 1510   NA 139 08/22/2019 1432   K 4.2 06/19/2020 1510   CL 101 06/19/2020 1510   CO2 23 06/19/2020 1459   GLUCOSE 89 06/19/2020 1510   BUN 11 06/19/2020 1510   BUN 10 08/22/2019 1432   CREATININE 0.80 06/19/2020 1510   CREATININE 0.86 09/27/2017 0959   CALCIUM 9.2 06/19/2020 1459   PROT 7.4 06/19/2020 1459   ALBUMIN 4.2 06/19/2020 1459   AST 14 (L) 06/19/2020 1459   ALT 12 06/19/2020 1459   ALKPHOS 80 06/19/2020 1459   BILITOT 1.0 06/19/2020 1459   GFRNONAA >60 06/19/2020 1459   GFRAA 96 08/22/2019 1432   Lab Results  Component Value Date   WBC 11.7 (H) 06/19/2020   NEUTROABS 9.0 (H) 06/19/2020   HGB 14.3 06/19/2020   HCT 42.0 06/19/2020   MCV 95.3 06/19/2020   PLT 273 06/19/2020    Imaging:  [facility] Clinician Interpretation: I have personally reviewed the CNS images as listed.  My interpretation, in the context of the patient's clinical presentation, is stable disease  CT HEAD WO CONTRAST  Result Date: 06/19/2020 CLINICAL DATA:  70 year old male with dizziness. Prior left basal ganglia biopsy. EXAM: CT HEAD WITHOUT CONTRAST TECHNIQUE: Contiguous axial images were obtained from the base of the skull through the vertex without intravenous contrast. COMPARISON:  Head CT dated 04/06/2020. FINDINGS: Brain: The ventricles and sulci appropriate size for patient's age. Mild periventricular and deep white matter chronic microvascular ischemic changes noted. There is a faint 7 mm high attenuating lesion in the region of the left basal ganglia (15/2), present on the prior CT. There is no acute intracranial hemorrhage. No mass effect or midline shift. No extra-axial fluid collection.  Vascular: No hyperdense vessel or unexpected calcification. Skull: Normal. Negative for fracture or focal lesion. Sinuses/Orbits: No acute finding. Other: None IMPRESSION: 1. No acute intracranial pathology. 2. Mild chronic microvascular ischemic changes. 3. A 7 mm faintly hyperdense lesion in the left basal ganglia. Electronically Signed   By: Anner Crete M.D.   On: 06/19/2020 15:23  MR Brain Wo Contrast (neuro protocol)  Result Date: 06/19/2020 CLINICAL DATA:  Initial evaluation for neuro deficit, stroke suspected. EXAM: MRI HEAD WITHOUT CONTRAST TECHNIQUE: Multiplanar, multiecho pulse sequences of the brain and surrounding structures were obtained without intravenous contrast. COMPARISON:  Prior CT from earlier the same day. FINDINGS: Brain: Linear needle biopsy tract again noted extending through the left frontal lobe, terminating at the left basal ganglia. Previously described masslike confluent T2/FLAIR hyperintensity involving the left basal ganglia has slightly worsened and progressed as compared to previous exam (series 11, image 27 on today's exam versus series 4, image 35 on previous MRI from 04/30/2020 for example). Signal abnormality slightly superiorly adjacent to the frontal horn of the left lateral ventricle is slightly improved. No significant regional mass effect on the adjacent left lateral ventricle. Superimposed susceptibility artifact is relatively unchanged. No other new areas of focal signal abnormality. No other evidence for acute or subacute ischemic infarct. Gray-white matter differentiation otherwise maintained. No encephalomalacia elsewhere within the brain. No other evidence for acute or chronic intracranial hemorrhage. No other mass lesion, mass effect, or midline shift. Ventricles stable in size without hydrocephalus. No extra-axial fluid collection. Pituitary gland and suprasellar region remain within normal limits. Midline structures intact. Vascular: Major intracranial  vascular flow voids are maintained. Skull and upper cervical spine: Craniocervical junction within normal limits. Bone marrow signal intensity normal. No scalp soft tissue abnormality. Sinuses/Orbits: Patient status post bilateral ocular lens replacement. Globes and orbital soft tissues demonstrate no acute finding. Mild mucosal thickening noted within the ethmoidal air cells. Paranasal sinuses are otherwise clear. No mastoid effusion. Inner ear structures grossly normal. Other: None. IMPRESSION: 1. Slight interval increase in T2/FLAIR signal abnormality involving the left basal ganglia as compared to previous MRI from 04/30/2020. Similar associated susceptibility artifact in blood products. No significant regional mass effect or midline shift. Again, finding is indeterminate, but could reflect a persistent area of encephalitis/cerebritis or other CNS inflammatory process. Changes related to subacute ischemia felt to be unlikely given the slightly worsened appearance of this finding as compared to previous. Further evaluation with dedicated postcontrast imaging recommended for complete characterization and direct comparative purposes. 2. No other acute intracranial abnormality. Electronically Signed   By: Jeannine Boga M.D.   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  MRI demonstrates slight increase in T2 signal abnormality, but there is no mass effect, and changes can be explained by discontinuation of corticosteroids.  No IV constrast was administered for the study.    We recommended continued close imaging surveillance only at this time.  He is agreeable with this plan.  For chronic daily headaches, recommended reaching out to sleep doctor or pulmonology for re-titration of CPAP given lack of other etiology to explain symptoms.  The stable brain lesion is very unlikely to be causing daily headaches at this  time.  We could consider headache preventive medications as a next resort.  We ask that Cory Mathis return to clinic in 3 months following next brain MRI, or sooner as needed.  All questions were answered. The patient knows to call the clinic with any problems, questions or concerns. No barriers to learning were detected.  The total time spent in the encounter was 40 minutes and more than 50% was on counseling and review of test results   [name], MD Medical Director of Neuro-Oncology [facility] at [place] 06/24/20 2:24 PM

## 2020-06-27 ENCOUNTER — Other Ambulatory Visit: Payer: Self-pay | Admitting: Internal Medicine

## 2020-06-28 ENCOUNTER — Telehealth: Payer: Self-pay | Admitting: *Deleted

## 2020-06-28 NOTE — Telephone Encounter (Signed)
[place] Work  Clinical Social Work was referred by neuro-oncology team for assessment of psychosocial needs; specifically for patient's spouse.  Clinical Social Worker attempted to contact patient caregiver by phone to offer support and assess for needs.  CSW left voicemail to return call.   Gwinda Maine, LCSW  Clinical Social Worker [facility]

## 2020-07-02 ENCOUNTER — Other Ambulatory Visit: Payer: Medicare Other

## 2020-07-05 ENCOUNTER — Inpatient Hospital Stay: Payer: Medicare Other | Admitting: Internal Medicine

## 2020-07-05 ENCOUNTER — Inpatient Hospital Stay: Payer: Medicare Other

## 2020-07-14 ENCOUNTER — Other Ambulatory Visit: Payer: Self-pay | Admitting: Radiation Therapy

## 2020-07-14 DIAGNOSIS — Z20822 Contact with and (suspected) exposure to covid-19: Secondary | ICD-10-CM | POA: Diagnosis not present

## 2020-07-14 NOTE — Addendum Note (Signed)
Addended by: Pincus Large on: 07/14/2020 11:15 AM   Modules accepted: Orders

## 2020-07-16 ENCOUNTER — Other Ambulatory Visit: Payer: Self-pay | Admitting: Internal Medicine

## 2020-07-16 ENCOUNTER — Telehealth: Payer: Self-pay | Admitting: *Deleted

## 2020-07-16 MED ORDER — LEVETIRACETAM 500 MG PO TABS: 500.0000 mg | ORAL_TABLET | Freq: Two times a day (BID) | ORAL | 2 refills | Status: DC

## 2020-07-16 NOTE — Telephone Encounter (Signed)
Cory Mathis states he has has "3 episodes of weakness where I lost control of my leg and had to go down to the ground"   He would like to speak with Dr Mickeal Skinner today.

## 2020-07-16 NOTE — Progress Notes (Signed)
Recommending trial of keppra 500mg  BID for episodes of leg weakness which are stereotypical in nature.  There is no formal diagnosis of epilepsy at this time.  Will follow up again in person in 2 months.  [name], MD

## 2020-07-19 ENCOUNTER — Telehealth: Payer: Self-pay

## 2020-07-19 MED ORDER — DEXAMETHASONE 4 MG PO TABS: 4.0000 mg | ORAL_TABLET | Freq: Every day | ORAL | 0 refills | Status: DC

## 2020-07-19 NOTE — Addendum Note (Signed)
Addended by: [name] on: 07/19/2020 02:46 PM   Modules accepted: Orders

## 2020-07-19 NOTE — Telephone Encounter (Signed)
We can stop the Keppra if it has not been well tolerated.    We can instead do repeat trial of dexamethasone for his symptoms, 4mg  daily x1 week.  I will order to his pharmacy  Cory Sellers, MD

## 2020-07-19 NOTE — Telephone Encounter (Signed)
Per Dr. Mickeal Skinner: Pt notified he can stop the Keppra if it has not been well tolerated.    And he can instead do repeat trial of dexamethasone for his symptoms, 4mg  daily x1 week.  was ordered/sent to his pharmacy Pt verbalized understanding.

## 2020-07-19 NOTE — Telephone Encounter (Signed)
Returned call from pt. Pt states yesterday 07/18/20 was day 3 on Keppra and starting yesterday he has feel more dizziness than he had been and his leg weakness has increased. Pt wants to know can he stop the keppra or cut dose down. Pt also asking if he should take a low dose of prednisone. Will notify Dr Mickeal Skinner.

## 2020-07-27 ENCOUNTER — Telehealth: Payer: Self-pay

## 2020-07-27 MED ORDER — DEXAMETHASONE 2 MG PO TABS: 2.0000 mg | ORAL_TABLET | Freq: Every day | ORAL | 0 refills | Status: DC

## 2020-07-27 NOTE — Telephone Encounter (Signed)
Pt states he completed his dexamethasone and tolerated it well. He wants to know if this is a rx he should continue to take and if so, he needs a refill.  Pt also states he is experiencing right side weakness in his ankle area and his hand. Pt wants to know if he should schedule his MRI and follow-up appointment sooner?

## 2020-07-27 NOTE — Telephone Encounter (Signed)
I will re-order the decadron but reduce to 2mg  daily  I am ok with moving up the MRI scan because of the new symptoms.  Please let me know if I need to place a new order or if he can be simply rescheduled.  Will need a visit following the scan, if we can reschedule, as well  [name], MD

## 2020-07-29 DIAGNOSIS — G4700 Insomnia, unspecified: Secondary | ICD-10-CM | POA: Diagnosis not present

## 2020-08-02 ENCOUNTER — Telehealth: Payer: Self-pay

## 2020-08-02 ENCOUNTER — Other Ambulatory Visit: Payer: Self-pay | Admitting: Radiation Therapy

## 2020-08-02 NOTE — Telephone Encounter (Signed)
Patient's wife called office stating patient had an "episode" on 07/31/20. Patient's right hand began to tremble uncontrollably and patient had slurred, incomprehensible speech.  Per wife, the episode lasted for 1 minute.  Wife states patient has also noticed moments where patient is unable to grip items with right hand.  Wife states patient will be holding something in his right hand and the patient will then drop it.  Wife states this doesn't happen each time he holds something in his right hand, but that he has noticed it happening more often. Wife has moved his upcoming MRI to 08/06/20 and wants to know if patient can be seen by Dr. Mickeal Skinner sooner than his currently scheduled April appointment.

## 2020-08-02 NOTE — Telephone Encounter (Signed)
Per Dr. Renda Rolls request, email and scheduling message sent to have patient added to Tumor Board and MD follow-up on 08/09/20.  Called and spoke to patient's wife and informed her to expect a call from the scheduling department. Wife verbalized understanding.

## 2020-08-06 ENCOUNTER — Ambulatory Visit
Admission: RE | Admit: 2020-08-06 | Discharge: 2020-08-06 | Disposition: A | Discharge status: Home / Self Care | Payer: Medicare Other | Source: Ambulatory Visit | Attending: Internal Medicine | Admitting: Internal Medicine

## 2020-08-06 DIAGNOSIS — G9389 Other specified disorders of brain: Secondary | ICD-10-CM | POA: Diagnosis not present

## 2020-08-06 DIAGNOSIS — J3489 Other specified disorders of nose and nasal sinuses: Secondary | ICD-10-CM | POA: Diagnosis not present

## 2020-08-06 DIAGNOSIS — C719 Malignant neoplasm of brain, unspecified: Secondary | ICD-10-CM | POA: Diagnosis not present

## 2020-08-06 DIAGNOSIS — D496 Neoplasm of unspecified behavior of brain: Secondary | ICD-10-CM

## 2020-08-06 IMAGING — MR MR HEAD WO/W CM
14 series · 48 of 48 positions shown · IV contrast (multihance)
Comparison: [DATE], [DATE]

CLINICAL DATA: Brain tumor follow-up; past pathology does not
suggest glial neoplasm

EXAM:
MRI HEAD WITHOUT AND WITH CONTRAST
TECHNIQUE: Multiplanar, multiecho pulse sequences of the brain and surrounding
structures were obtained without and with intravenous contrast.
CONTRAST:  18mL MULTIHANCE GADOBENATE DIMEGLUMINE 529 MG/ML IV SOLN

[Series 5: T1 · sagittal · 4.0mm · 0.94mm/px · 1 of 31 slices shown (1 of 3)]
[im 1/31]
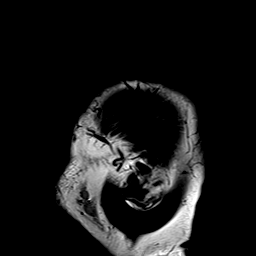

[Series 6: DWI · axial · 3.0mm · 0.94mm/px · z∈[-90,+60]mm · 8 of 167 slices shown (1 of 3)]
[im 1/167]
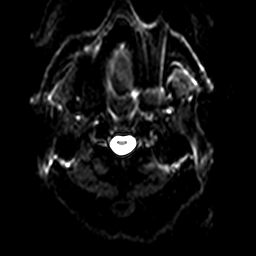
[im 24/167]
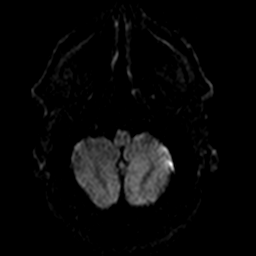
[im 48/167]
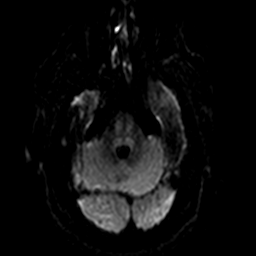
[im 72/167]
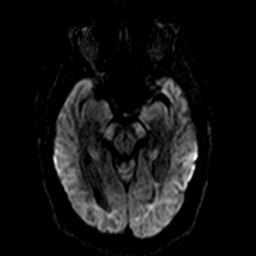
[im 95/167]
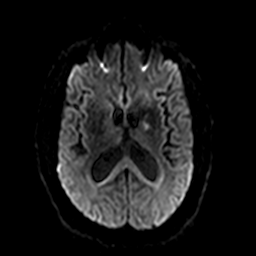
[im 119/167]
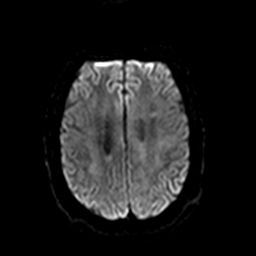
[im 143/167]
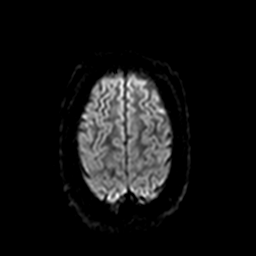
[im 167/167]
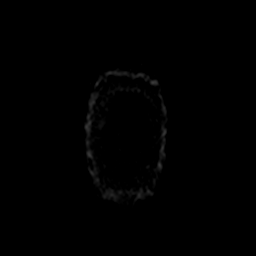

[Series 7: ax dwi_tracew · axial · 3.0mm · 0.94mm/px · z∈[-90,+60]mm · 4 of 85 slices shown]
[im 1/85]
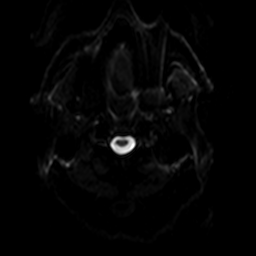
[im 29/85]
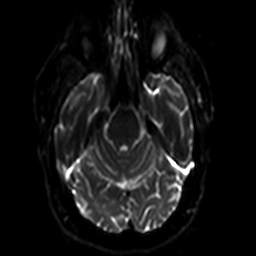
[im 57/85]
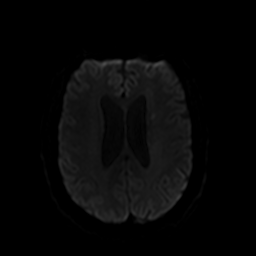
[im 85/85]
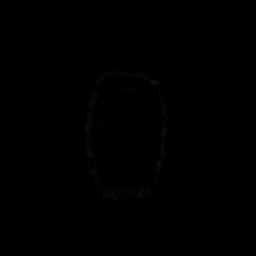

[Series 8: ax dwi_adc · axial · 3.0mm · 0.94mm/px · z∈[-90,+60]mm · 2 of 42 slices shown]
[im 1/42]
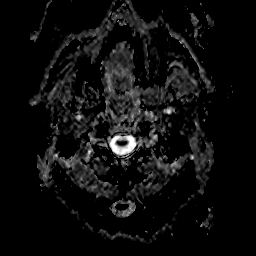
[im 42/42]
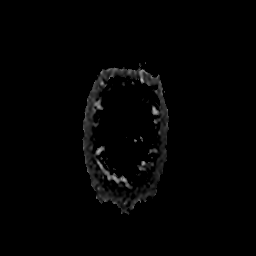

[Series 9: DWI · coronal · 5.0mm · 1.44mm/px · 3 of 72 slices shown (2 of 3)]
[im 1/72]
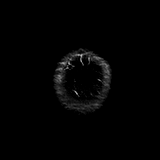
[im 36/72]
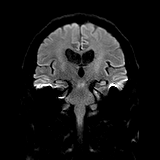
[im 72/72]
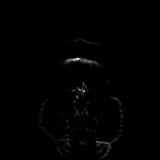

[Series 10: DWI · coronal · 5.0mm · 1.44mm/px · 2 of 36 slices shown (3 of 3)]
[im 1/36]
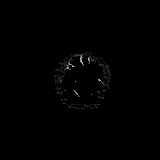
[im 36/36]
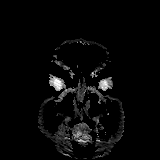

[Series 11: T2 · axial · 4.0mm · 0.36mm/px · 1 of 30 slices shown]
[im 1/30]
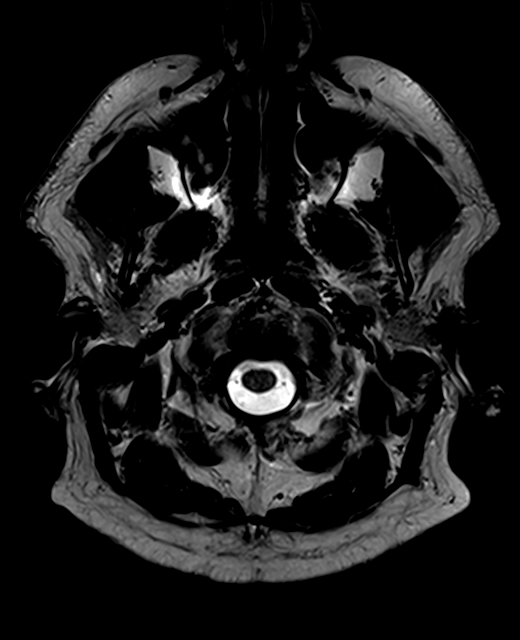

[Series 12: FLAIR · axial · 3.0mm · 0.72mm/px · 1 of 26 slices shown]
[im 1/26]
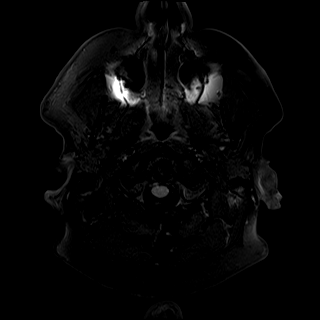

[Series 14: swi_images · axial · 1.5mm · 0.90mm/px · z∈[-60,+79]mm · 5 of 96 slices shown]
[im 1/96]
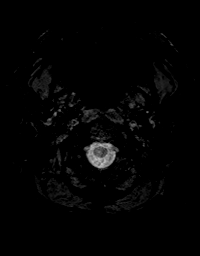
[im 24/96]
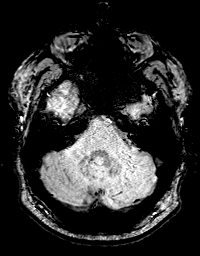
[im 48/96]
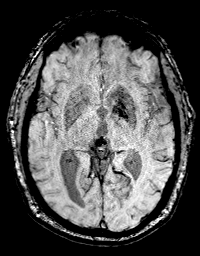
[im 72/96]
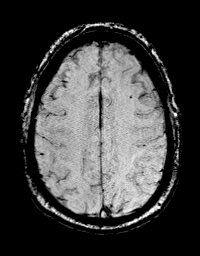
[im 96/96]
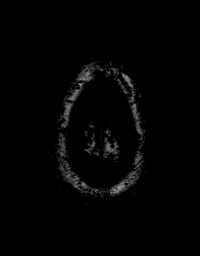

[Series 15: T1 · axial · 1.0mm · 0.94mm/px · z∈[-76,+81]mm · 8 of 160 slices shown (2 of 3)]
[im 1/160]
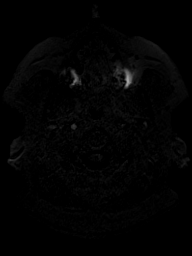
[im 23/160]
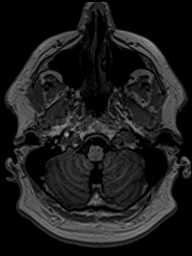
[im 46/160]
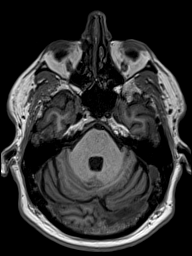
[im 69/160]
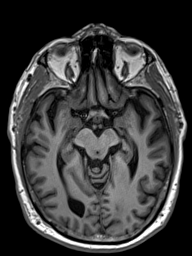
[im 91/160]
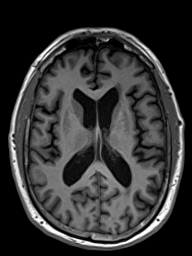
[im 114/160]
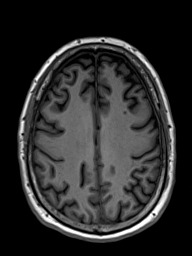
[im 137/160]
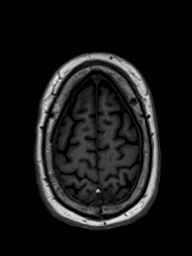
[im 160/160]
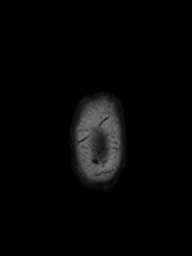

[Series 16: T2 post-contrast · coronal · 4.5mm · 0.36mm/px · 2 of 35 slices shown]
[im 1/35]
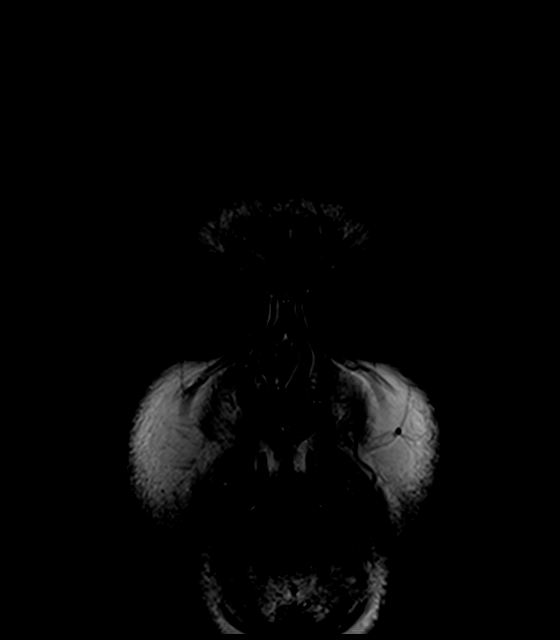
[im 35/35]
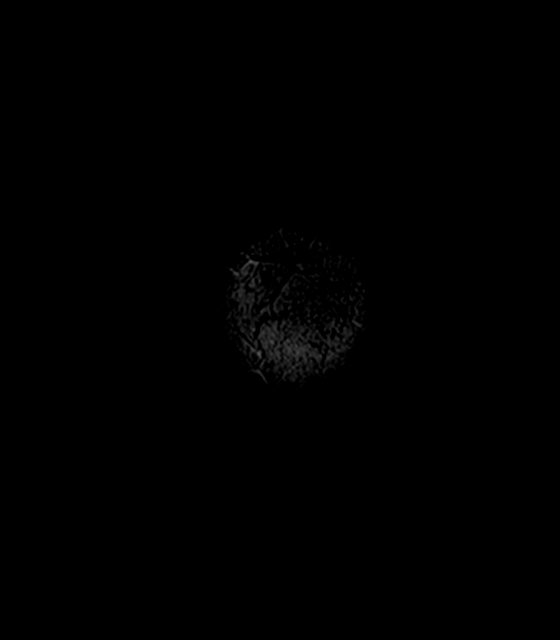

[Series 17: T1 · axial · 1.0mm · 0.94mm/px · z∈[-76,+81]mm · 8 of 160 slices shown (3 of 3)]
[im 1/160]
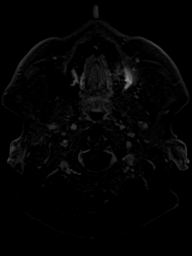
[im 23/160]
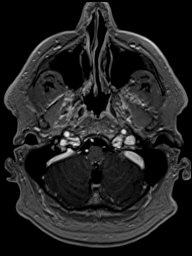
[im 46/160]
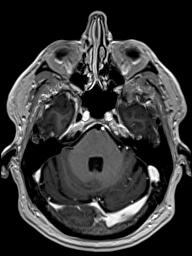
[im 69/160]
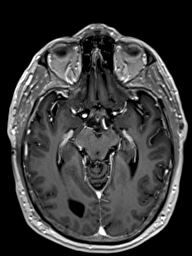
[im 91/160]
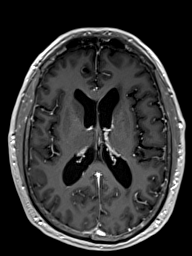
[im 114/160]
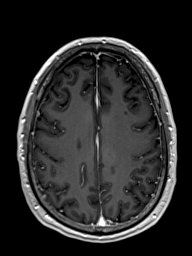
[im 137/160]
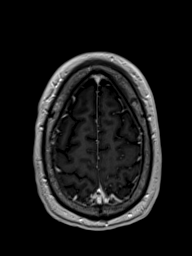
[im 160/160]
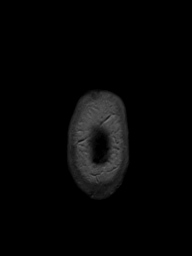

[Series 18: T1 post-contrast · coronal · 4.5mm · 0.72mm/px · 2 of 35 slices shown (1 of 2)]
[im 1/35]
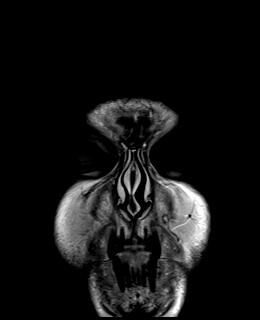
[im 35/35]
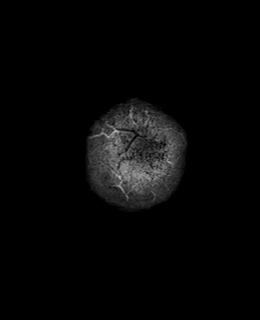

[Series 19: T1 post-contrast · sagittal · 4.0mm · 0.94mm/px · 1 of 31 slices shown (2 of 2)]
[im 1/31]
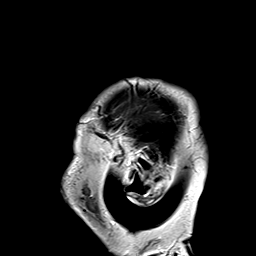

[48 of 48 positions shown; findings below may reference images not displayed]

FINDINGS: Brain: T2 hyperintense biopsy tract is again identified extending
from the left frontal burr hole to the basal ganglia. T2
hyperintensity in the region of the basal ganglia and adjacent white
matter on the prior study has substantially decreased. Asymmetric
susceptibility hypointensity is region reflects chronic blood
products or mineralization. Residual ill-defined punctate foci of
enhancement with significant decreased since [DATE].

No acute infarction. Ventricles are stable in size and
configuration. No mass effect.

Vascular: Major vessel flow voids at the skull base are preserved.

Skull and upper cervical spine: Normal marrow signal is preserved.

Sinuses/Orbits: Minor mucosal thickening. Bilateral lens
replacements.

Other: Sella is unremarkable.  Mastoid air cells are clear.
IMPRESSION: Substantial decrease in T2 hyperintensity in the region of the left
basal ganglia and adjacent white matter since [DATE].
Significant decrease in associated enhancement since [DATE].
Correlate with changes steroid treatment.

## 2020-08-06 MED ORDER — GADOBENATE DIMEGLUMINE 529 MG/ML IV SOLN
18.0000 mL | Freq: Once | INTRAVENOUS | Status: AC | PRN
  Administered 2020-08-06: 18 mL via INTRAVENOUS

## 2020-08-09 ENCOUNTER — Inpatient Hospital Stay: Payer: Medicare Other | Attending: Internal Medicine

## 2020-08-09 ENCOUNTER — Inpatient Hospital Stay (HOSPITAL_BASED_OUTPATIENT_CLINIC_OR_DEPARTMENT_OTHER): Payer: Medicare Other | Admitting: Internal Medicine

## 2020-08-09 ENCOUNTER — Other Ambulatory Visit: Payer: Self-pay

## 2020-08-09 VITALS — BP 146/61 | HR 56 | Temp 97.4°F | Resp 18 | Ht 70.0 in | Wt 212.3 lb

## 2020-08-09 DIAGNOSIS — Z882 Allergy status to sulfonamides status: Secondary | ICD-10-CM | POA: Insufficient documentation

## 2020-08-09 DIAGNOSIS — Z8042 Family history of malignant neoplasm of prostate: Secondary | ICD-10-CM | POA: Diagnosis not present

## 2020-08-09 DIAGNOSIS — Z8719 Personal history of other diseases of the digestive system: Secondary | ICD-10-CM | POA: Diagnosis not present

## 2020-08-09 DIAGNOSIS — D496 Neoplasm of unspecified behavior of brain: Secondary | ICD-10-CM | POA: Insufficient documentation

## 2020-08-09 DIAGNOSIS — G939 Disorder of brain, unspecified: Secondary | ICD-10-CM | POA: Diagnosis not present

## 2020-08-09 DIAGNOSIS — Z79899 Other long term (current) drug therapy: Secondary | ICD-10-CM | POA: Diagnosis not present

## 2020-08-09 DIAGNOSIS — Z7952 Long term (current) use of systemic steroids: Secondary | ICD-10-CM | POA: Insufficient documentation

## 2020-08-09 DIAGNOSIS — Z713 Dietary counseling and surveillance: Secondary | ICD-10-CM | POA: Diagnosis not present

## 2020-08-09 DIAGNOSIS — R519 Headache, unspecified: Secondary | ICD-10-CM | POA: Diagnosis not present

## 2020-08-09 DIAGNOSIS — Z881 Allergy status to other antibiotic agents status: Secondary | ICD-10-CM | POA: Diagnosis not present

## 2020-08-09 DIAGNOSIS — Z87891 Personal history of nicotine dependence: Secondary | ICD-10-CM | POA: Diagnosis not present

## 2020-08-09 DIAGNOSIS — Z8249 Family history of ischemic heart disease and other diseases of the circulatory system: Secondary | ICD-10-CM | POA: Insufficient documentation

## 2020-08-09 DIAGNOSIS — Z86010 Personal history of colonic polyps: Secondary | ICD-10-CM | POA: Diagnosis not present

## 2020-08-09 DIAGNOSIS — Z7182 Exercise counseling: Secondary | ICD-10-CM | POA: Diagnosis not present

## 2020-08-09 DIAGNOSIS — Z7901 Long term (current) use of anticoagulants: Secondary | ICD-10-CM | POA: Diagnosis not present

## 2020-08-09 DIAGNOSIS — R413 Other amnesia: Secondary | ICD-10-CM | POA: Insufficient documentation

## 2020-08-09 NOTE — Progress Notes (Signed)
[facility], [place] [address] [phone]   Interval Evaluation  Date of Service: 08/09/20 Patient Name: Cory Mathis Patient MRN: [id number] Patient DOB: 1951-05-03 Provider: [name], MD  Identifying Statement:  Cory Mathis is a 70 y.o. male with left frontal mass   Oncologic History 04/06/20: Stereotactic biopsy by Dr. Zada Finders; path indeterminate  Biomarkers:  MGMT Unknown.  IDH 1/2 Unknown.  EGFR Unknown  TERT Unknown   Interval History:  Cory Mathis presents today after recent MRI brain for review.  He and his wife describe ongoing deficits related to short term memory, occassional lapses in balance, and some episodes of dropping objects out of his right hand.  No seizure activity aside from tremulousness when reaching for objects at times. Headaches have been somewhat better controlled lately.  He takes Tylenol less than daily.    H+P (04/16/20) Patient presented to medical attention in October 2021 with several weeks of daily headaches which were new.  Headaches are described as moderate pressure affecting bilateral temples, worse at night.  He also describes some modest impairment of short term memory, with some forgetting of names and dates.  No interference with day to day functioning.  He underwent brain MRI which demonstrated left frontal deep brain mass, which was biopsied on 04/06/20 by Dr. Zada Finders.  Since surgery he continues to have headaches but no other new or progressive changes.  Presents today for pathology review and treatment planning.    Medications: Current Outpatient Medications on File Prior to Visit  Medication Sig Dispense Refill  . acetaminophen (TYLENOL) 500 MG tablet Take 1,000 mg by mouth 2 (two) times daily as needed for headache.    Marland Kitchen amLODipine (NORVASC) 5 MG tablet TAKE 1 TABLET(5 MG) BY MOUTH TWICE DAILY (Patient taking differently: Take 5 mg by mouth daily.) 180 tablet  3  . carboxymethylcellulose (REFRESH PLUS) 0.5 % SOLN Place 1 drop into both eyes 4 (four) times daily as needed (dry eyes).    Marland Kitchen dexamethasone (DECADRON) 2 MG tablet Take 1 tablet (2 mg total) by mouth daily. 30 tablet 0  . eszopiclone (LUNESTA) 2 MG TABS tablet Take 2 mg by mouth at bedtime.    . levETIRAcetam (KEPPRA) 500 MG tablet Take 1 tablet (500 mg total) by mouth 2 (two) times daily. 60 tablet 2  . rosuvastatin (CRESTOR) 5 MG tablet TAKE 1/2 TABLET(2.5 MG) BY MOUTH DAILY 45 tablet 2  . XARELTO 20 MG TABS tablet TAKE 1 TABLET(20 MG) BY MOUTH DAILY WITH SUPPER (Patient taking differently: Take 20 mg by mouth daily with supper.) 30 tablet 6   No current facility-administered medications on file prior to visit.    Allergies:  Allergies  Allergen Reactions  . Other     Mints - makes him sneeze  . Sulfa Antibiotics Hives and Rash  . Sulfasalazine Hives and Other (See Comments)    other  . Metronidazole Other (See Comments)    Pt reported he was light headed for 2 weeks.     Past Medical History:  Past Medical History:  Diagnosis Date  . BPH (benign prostatic hyperplasia)   . Diverticulosis of colon   . Dysrhythmia 2019   Afib  . GERD (gastroesophageal reflux disease)    prn med. control  . History of adenomatous polyp of colon    03-29-2007  tubular adenoma/  08-07-2016  hyperplastic and tubular adenoma's  . History of alcoholic gastritis  10-30-2008  gastritis, esophagitis, peptic duodenitis  . History of colonic diverticulitis    01-31-2016  resolved w/ medications  . History of Helicobacter pylori infection    10-30-2008  . 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 Hypertension   . Internal hemorrhoids   . Memory loss   . OSA on CPAP    CPAP nightly  . Right thyroid nodule   . Urinary retention 12/13/2016   Past Surgical History:  Past Surgical History:  Procedure Laterality Date  . APPLICATION OF CRANIAL  NAVIGATION N/A 04/06/2020   Procedure: APPLICATION OF CRANIAL NAVIGATION;  Surgeon: Judith Part, MD;  Location: [facility];  Service: Neurosurgery;  Laterality: N/A;  . BRAIN SURGERY    . CARDIOVERSION N/A 12/23/2018   Procedure: CARDIOVERSION;  Surgeon: Buford Dresser, MD;  Location: [facility] ENDOSCOPY;  Service: Cardiovascular;  Laterality: N/A;  . COLONOSCOPY  last one 08-07-2016  . EYE SURGERY    . FRAMELESS  BIOPSY WITH BRAINLAB Left 04/06/2020   Procedure: Left Stereotactic brain biopsy with brainlab;  Surgeon: Judith Part, MD;  Location: [facility];  Service: Neurosurgery;  Laterality: Left;  . HEMORRHOID BANDING  10-20-2016;  09-11-2016  . HERNIA REPAIR    . KNEE ARTHROSCOPY Bilateral    10 + yrs ago  . LASIK Bilateral   . PILONIDAL CYST EXCISION    . THYROIDECTOMY Left 2013  . TRANSURETHRAL RESECTION OF PROSTATE N/A 01/04/2017   Procedure: TRANSURETHRAL RESECTION OF THE PROSTATE (TURP);  Surgeon: Franchot Gallo, MD;  Location[facility];  Service: Urology;  Laterality: N/A;  . UPPER GASTROINTESTINAL ENDOSCOPY  10/30/2008  . WISDOM TOOTH EXTRACTION     Social History:  Social History   Socioeconomic History  . Marital status: Married    Spouse name: Not on file  . Number of children: 0  . Years of education: 16+  . Highest education level: Bachelor's degree (e.g., BA, AB, BS)  Occupational History  . Occupation: Retired  Tobacco Use  . Smoking status: Former Research scientist (life sciences)  . Smokeless tobacco: Never Used  Vaping Use  . Vaping Use: Never used  Substance and Sexual Activity  . Alcohol use: Yes    Comment: social   . Drug use: No  . Sexual activity: Not on file  Other Topics Concern  . Not on file  Social History Narrative   Left-handed.   1.5 cups caffeine per day.   Social Determinants of Health   Financial Resource Strain: Not on file  Food Insecurity: Not on file  Transportation Needs: Not on file  Physical Activity: Not on file   Stress: Not on file  Social Connections: Not on file  Intimate Partner Violence: Not on file   Family History:  Family History  Problem Relation Age of Onset  . Heart attack Mother        67's  . Other Father        atherosclerosis - 36's  . Prostate cancer Maternal Grandfather   . Other Sister        brain tumor - unsure if it was cancer - 69's  . Colon cancer Neg Hx   . Esophageal cancer Neg Hx   . Pancreatic cancer Neg Hx   . Rectal cancer Neg Hx   . Stomach cancer Neg Hx     Review of Systems: Constitutional: Doesn't report fevers, chills or abnormal weight loss Eyes: Doesn't report blurriness of  vision Ears, nose, mouth, throat, and face: Doesn't report sore throat Respiratory: Doesn't report cough, dyspnea or wheezes Cardiovascular: Doesn't report palpitation, chest discomfort  Gastrointestinal:  Doesn't report nausea, constipation, diarrhea GU: Doesn't report incontinence Skin: Doesn't report skin rashes Neurological: Per HPI Musculoskeletal: Doesn't report joint pain Behavioral/Psych: Doesn't report anxiety  Physical Exam: Vitals:   08/09/20 1200  BP: (!) 146/61  Pulse: (!) 56  Resp: 18  Temp: (!) 97.4 F (36.3 C)  SpO2: 99%   KPS: 90. General: Alert, cooperative, pleasant, in no acute distress Head: Normal EENT: No conjunctival injection or scleral icterus.  Lungs: Resp effort normal Cardiac: Regular rate Abdomen: Non-distended abdomen Skin: No rashes cyanosis or petechiae. Extremities: No clubbing or edema  Neurologic Exam: Mental Status: Awake, alert, attentive to examiner. Oriented to self and environment. Language is fluent with intact comprehension.  Cranial Nerves: Visual acuity is grossly normal. Visual fields are full. Extra-ocular movements intact. No ptosis. Face is symmetric Motor: Tone and bulk are normal. Power is full in both arms and legs. Reflexes are symmetric, no pathologic reflexes present.  Sensory: Intact to light touch Gait:  Normal.   Labs: I have reviewed the data as listed    Component Value Date/Time   NA 136 06/19/2020 1510   NA 139 08/22/2019 1432   K 4.2 06/19/2020 1510   CL 101 06/19/2020 1510   CO2 23 06/19/2020 1459   GLUCOSE 89 06/19/2020 1510   BUN 11 06/19/2020 1510   BUN 10 08/22/2019 1432   CREATININE 0.80 06/19/2020 1510   CREATININE 0.86 09/27/2017 0959   CALCIUM 9.2 06/19/2020 1459   PROT 7.4 06/19/2020 1459   ALBUMIN 4.2 06/19/2020 1459   AST 14 (L) 06/19/2020 1459   ALT 12 06/19/2020 1459   ALKPHOS 80 06/19/2020 1459   BILITOT 1.0 06/19/2020 1459   GFRNONAA >60 06/19/2020 1459   GFRAA 96 08/22/2019 1432   Lab Results  Component Value Date   WBC 11.7 (H) 06/19/2020   NEUTROABS 9.0 (H) 06/19/2020   HGB 14.3 06/19/2020   HCT 42.0 06/19/2020   MCV 95.3 06/19/2020   PLT 273 06/19/2020    Imaging:  [place] Clinician Interpretation: I have personally reviewed the CNS images as listed.  My interpretation, in the context of the patient's clinical presentation, is stable disease  MR BRAIN W WO CONTRAST  Result Date: 08/06/2020 CLINICAL DATA:  Brain tumor follow-up; past pathology does not suggest glial neoplasm EXAM: MRI HEAD WITHOUT AND WITH CONTRAST TECHNIQUE: Multiplanar, multiecho pulse sequences of the brain and surrounding structures were obtained without and with intravenous contrast. CONTRAST:  69m MULTIHANCE GADOBENATE DIMEGLUMINE 529 MG/ML IV SOLN COMPARISON:  06/19/2020, 04/30/2020 FINDINGS: Brain: T2 hyperintense biopsy tract is again identified extending from the left frontal burr hole to the basal ganglia. T2 hyperintensity in the region of the basal ganglia and adjacent white matter on the prior study has substantially decreased. Asymmetric susceptibility hypointensity is region reflects chronic blood products or mineralization. Residual ill-defined punctate foci of enhancement with significant decreased since 04/30/2020. No acute infarction. Ventricles are stable in size  and configuration. No mass effect. Vascular: Major vessel flow voids at the skull base are preserved. Skull and upper cervical spine: Normal marrow signal is preserved. Sinuses/Orbits: Minor mucosal thickening. Bilateral lens replacements. Other: Sella is unremarkable.  Mastoid air cells are clear. IMPRESSION: Substantial decrease in T2 hyperintensity in the region of the left basal ganglia and adjacent white matter since 06/19/2020. Significant decrease in associated enhancement since  04/30/2020. Correlate with changes steroid treatment. Electronically Signed   By: Macy Mis M.D.   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  MRI demonstrates dramatic improvement in overall burden of T2 signal abnormality.  He is no longer dosing any dexamethasone.  Due to near complete resolution of his lesion, lack of neoplastic etiology, we recommended repeating MRI brain only with new or progressive symptoms.  We counseled him today regarding diet, exercise, sleep, mindfulness and their impact on slowing cognitive decline and short term memory loss.  We ask that Waylon Hershey return to clinic as needed.  All questions were answered. The patient knows to call the clinic with any problems, questions or concerns. No barriers to learning were detected.  The total time spent in the encounter was 40 minutes and more than 50% was on counseling and review of test results   [name], MD Medical Director of Neuro-Oncology [facility] [place] 08/09/20 11:59 AM

## 2020-08-10 ENCOUNTER — Ambulatory Visit: Payer: Medicare Other

## 2020-08-11 DIAGNOSIS — J3089 Other allergic rhinitis: Secondary | ICD-10-CM | POA: Diagnosis not present

## 2020-08-11 DIAGNOSIS — J301 Allergic rhinitis due to pollen: Secondary | ICD-10-CM | POA: Diagnosis not present

## 2020-08-14 ENCOUNTER — Other Ambulatory Visit: Payer: Medicare Other

## 2020-08-17 DIAGNOSIS — H35433 Paving stone degeneration of retina, bilateral: Secondary | ICD-10-CM | POA: Diagnosis not present

## 2020-08-17 DIAGNOSIS — D3131 Benign neoplasm of right choroid: Secondary | ICD-10-CM | POA: Diagnosis not present

## 2020-08-17 DIAGNOSIS — H3581 Retinal edema: Secondary | ICD-10-CM | POA: Diagnosis not present

## 2020-08-17 DIAGNOSIS — D3132 Benign neoplasm of left choroid: Secondary | ICD-10-CM | POA: Diagnosis not present

## 2020-08-26 DIAGNOSIS — D496 Neoplasm of unspecified behavior of brain: Secondary | ICD-10-CM | POA: Diagnosis not present

## 2020-08-26 DIAGNOSIS — R413 Other amnesia: Secondary | ICD-10-CM | POA: Diagnosis not present

## 2020-08-26 DIAGNOSIS — G8191 Hemiplegia, unspecified affecting right dominant side: Secondary | ICD-10-CM | POA: Diagnosis not present

## 2020-08-26 DIAGNOSIS — E039 Hypothyroidism, unspecified: Secondary | ICD-10-CM | POA: Diagnosis not present

## 2020-08-26 DIAGNOSIS — G4700 Insomnia, unspecified: Secondary | ICD-10-CM | POA: Diagnosis not present

## 2020-08-26 DIAGNOSIS — G4733 Obstructive sleep apnea (adult) (pediatric): Secondary | ICD-10-CM | POA: Diagnosis not present

## 2020-08-28 DIAGNOSIS — J301 Allergic rhinitis due to pollen: Secondary | ICD-10-CM | POA: Diagnosis not present

## 2020-08-28 DIAGNOSIS — J3089 Other allergic rhinitis: Secondary | ICD-10-CM | POA: Diagnosis not present

## 2020-08-30 DIAGNOSIS — J301 Allergic rhinitis due to pollen: Secondary | ICD-10-CM | POA: Diagnosis not present

## 2020-08-30 DIAGNOSIS — J3089 Other allergic rhinitis: Secondary | ICD-10-CM | POA: Diagnosis not present

## 2020-09-02 DIAGNOSIS — J301 Allergic rhinitis due to pollen: Secondary | ICD-10-CM | POA: Diagnosis not present

## 2020-09-02 DIAGNOSIS — J3089 Other allergic rhinitis: Secondary | ICD-10-CM | POA: Diagnosis not present

## 2020-09-06 ENCOUNTER — Ambulatory Visit: Payer: Medicare Other | Admitting: Internal Medicine

## 2020-09-06 DIAGNOSIS — J301 Allergic rhinitis due to pollen: Secondary | ICD-10-CM | POA: Diagnosis not present

## 2020-09-06 DIAGNOSIS — J3089 Other allergic rhinitis: Secondary | ICD-10-CM | POA: Diagnosis not present

## 2020-09-07 DIAGNOSIS — G8191 Hemiplegia, unspecified affecting right dominant side: Secondary | ICD-10-CM | POA: Diagnosis not present

## 2020-09-08 ENCOUNTER — Other Ambulatory Visit: Payer: Self-pay | Admitting: *Deleted

## 2020-09-08 DIAGNOSIS — Z23 Encounter for immunization: Secondary | ICD-10-CM | POA: Diagnosis not present

## 2020-09-10 ENCOUNTER — Other Ambulatory Visit: Payer: Medicare Other

## 2020-09-10 DIAGNOSIS — J3089 Other allergic rhinitis: Secondary | ICD-10-CM | POA: Diagnosis not present

## 2020-09-10 DIAGNOSIS — J3081 Allergic rhinitis due to animal (cat) (dog) hair and dander: Secondary | ICD-10-CM | POA: Diagnosis not present

## 2020-09-10 DIAGNOSIS — J301 Allergic rhinitis due to pollen: Secondary | ICD-10-CM | POA: Diagnosis not present

## 2020-09-13 ENCOUNTER — Other Ambulatory Visit: Payer: Self-pay | Admitting: Physician Assistant

## 2020-09-13 DIAGNOSIS — J3089 Other allergic rhinitis: Secondary | ICD-10-CM | POA: Diagnosis not present

## 2020-09-13 DIAGNOSIS — E042 Nontoxic multinodular goiter: Secondary | ICD-10-CM

## 2020-09-13 DIAGNOSIS — J301 Allergic rhinitis due to pollen: Secondary | ICD-10-CM | POA: Diagnosis not present

## 2020-09-15 DIAGNOSIS — J301 Allergic rhinitis due to pollen: Secondary | ICD-10-CM | POA: Diagnosis not present

## 2020-09-15 DIAGNOSIS — J3089 Other allergic rhinitis: Secondary | ICD-10-CM | POA: Diagnosis not present

## 2020-09-16 ENCOUNTER — Ambulatory Visit: Payer: Medicare Other | Admitting: Internal Medicine

## 2020-09-17 DIAGNOSIS — J301 Allergic rhinitis due to pollen: Secondary | ICD-10-CM | POA: Diagnosis not present

## 2020-09-17 DIAGNOSIS — J3089 Other allergic rhinitis: Secondary | ICD-10-CM | POA: Diagnosis not present

## 2020-09-20 DIAGNOSIS — J301 Allergic rhinitis due to pollen: Secondary | ICD-10-CM | POA: Diagnosis not present

## 2020-09-20 DIAGNOSIS — J3089 Other allergic rhinitis: Secondary | ICD-10-CM | POA: Diagnosis not present

## 2020-09-22 DIAGNOSIS — J301 Allergic rhinitis due to pollen: Secondary | ICD-10-CM | POA: Diagnosis not present

## 2020-09-22 DIAGNOSIS — J3089 Other allergic rhinitis: Secondary | ICD-10-CM | POA: Diagnosis not present

## 2020-09-28 ENCOUNTER — Telehealth: Payer: Self-pay | Admitting: Neurology

## 2020-09-28 ENCOUNTER — Ambulatory Visit
Admission: RE | Admit: 2020-09-28 | Discharge: 2020-09-28 | Disposition: A | Discharge status: Home / Self Care | Payer: Medicare Other | Source: Ambulatory Visit | Attending: Physician Assistant | Admitting: Physician Assistant

## 2020-09-28 ENCOUNTER — Ambulatory Visit (INDEPENDENT_AMBULATORY_CARE_PROVIDER_SITE_OTHER): Payer: Medicare Other | Admitting: Neurology

## 2020-09-28 ENCOUNTER — Encounter: Payer: Self-pay | Admitting: Neurology

## 2020-09-28 VITALS — BP 142/75 | HR 59

## 2020-09-28 DIAGNOSIS — G939 Disorder of brain, unspecified: Secondary | ICD-10-CM

## 2020-09-28 DIAGNOSIS — I69351 Hemiplegia and hemiparesis following cerebral infarction affecting right dominant side: Secondary | ICD-10-CM | POA: Diagnosis not present

## 2020-09-28 DIAGNOSIS — E041 Nontoxic single thyroid nodule: Secondary | ICD-10-CM | POA: Diagnosis not present

## 2020-09-28 DIAGNOSIS — J301 Allergic rhinitis due to pollen: Secondary | ICD-10-CM | POA: Diagnosis not present

## 2020-09-28 DIAGNOSIS — J3089 Other allergic rhinitis: Secondary | ICD-10-CM | POA: Diagnosis not present

## 2020-09-28 DIAGNOSIS — E042 Nontoxic multinodular goiter: Secondary | ICD-10-CM

## 2020-09-28 IMAGING — US US THYROID
1 series · 13 of 25 positions shown · non-contrast
Comparison: [DATE]

CLINICAL DATA: Multinodular goiter. Previous left
hemithyroidectomy. Previous FNA biopsy of superior right nodule
[DATE].

EXAM:
THYROID ULTRASOUND
TECHNIQUE: Ultrasound examination of the thyroid gland and adjacent soft
tissues was performed.

[Series 1: us thyroid · 0.05mm/px · 13 of 41 slices shown]
[im 1/41]
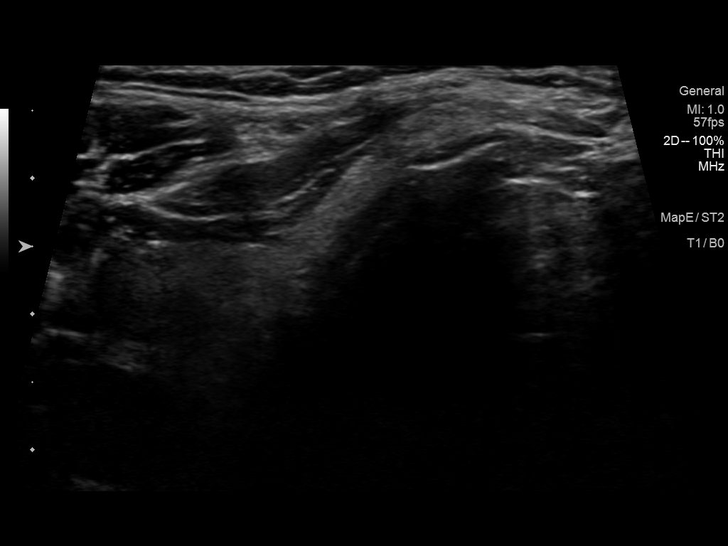
[im 4/41]
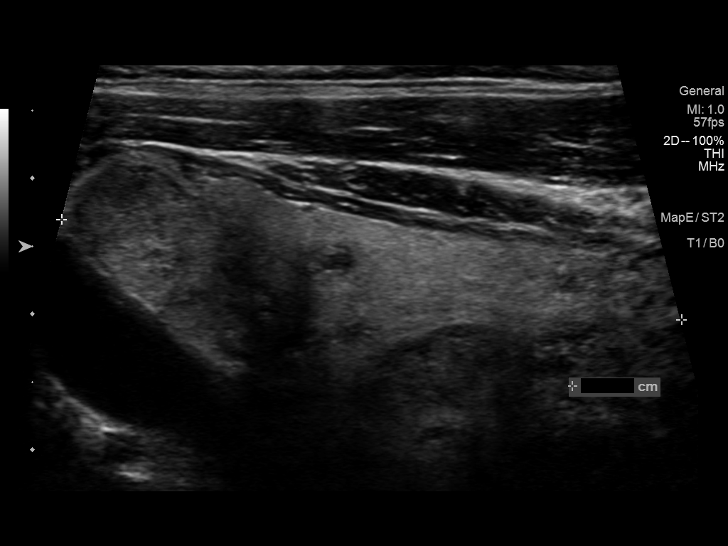
[im 7/41]
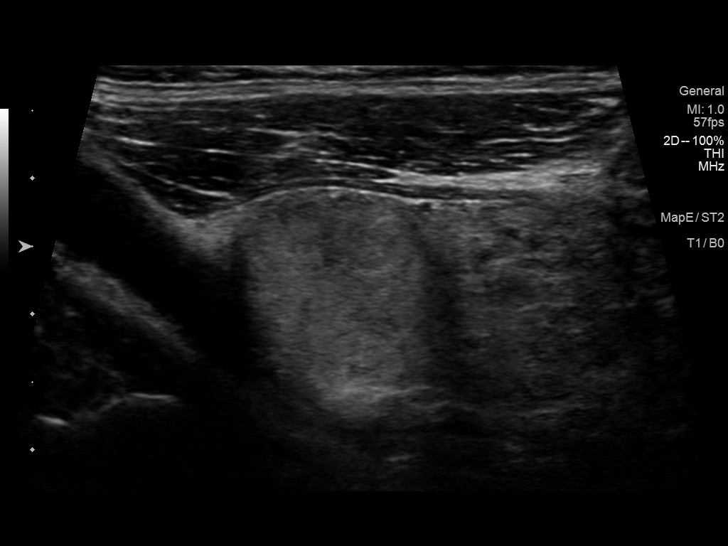
[im 11/41]
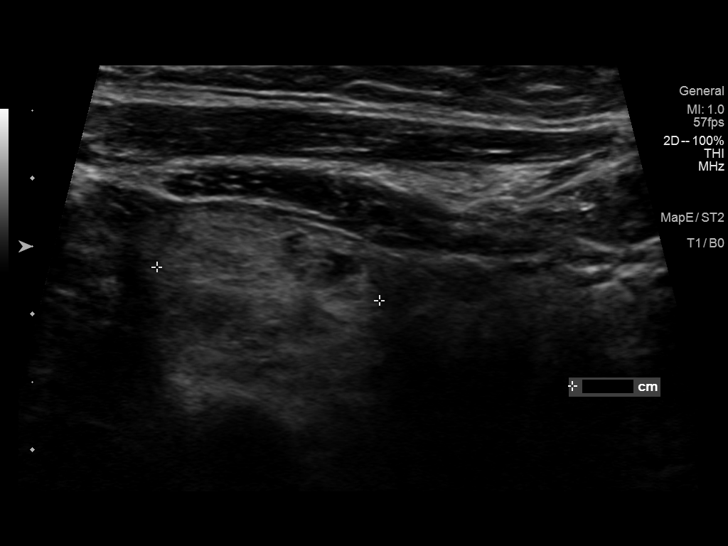
[im 14/41]
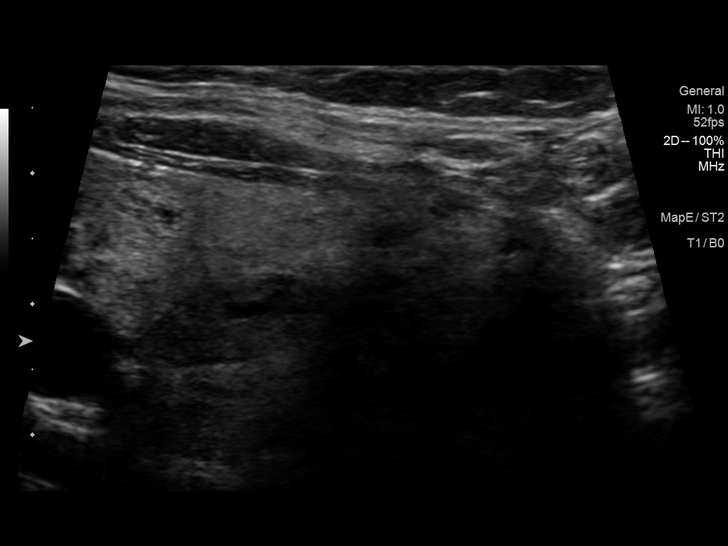
[im 17/41]
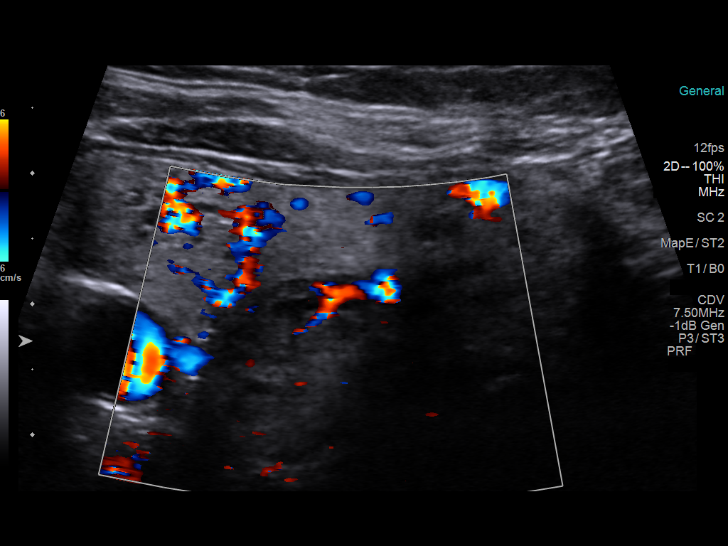
[im 21/41]
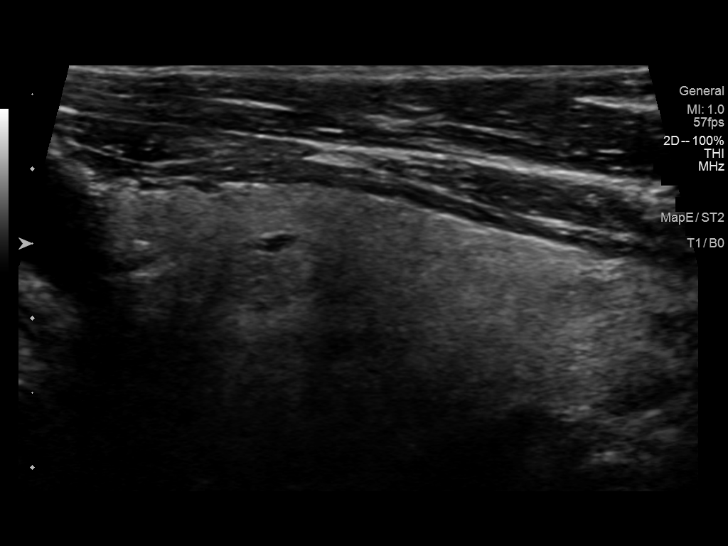
[im 24/41]
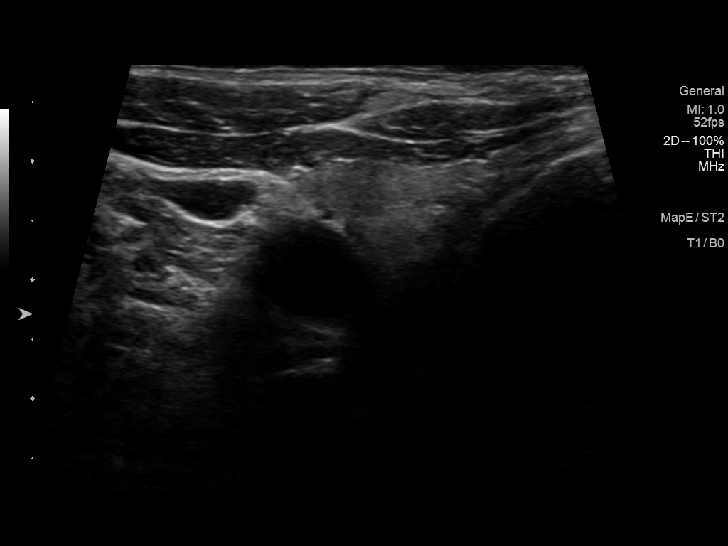
[im 27/41]
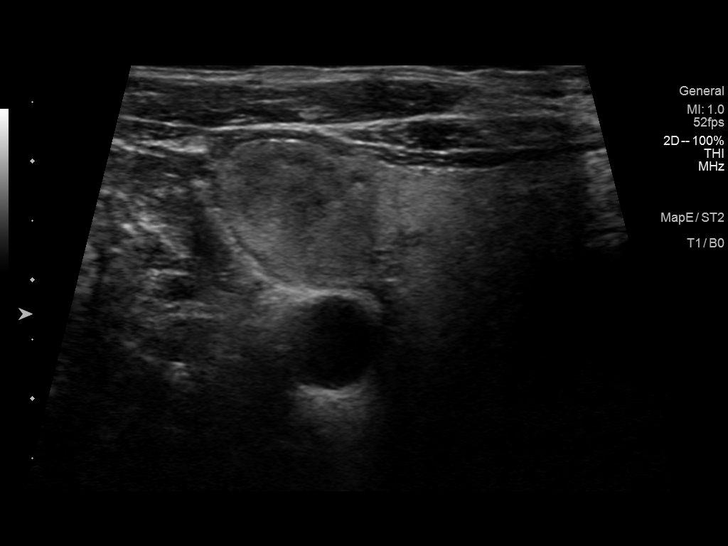
[im 31/41]
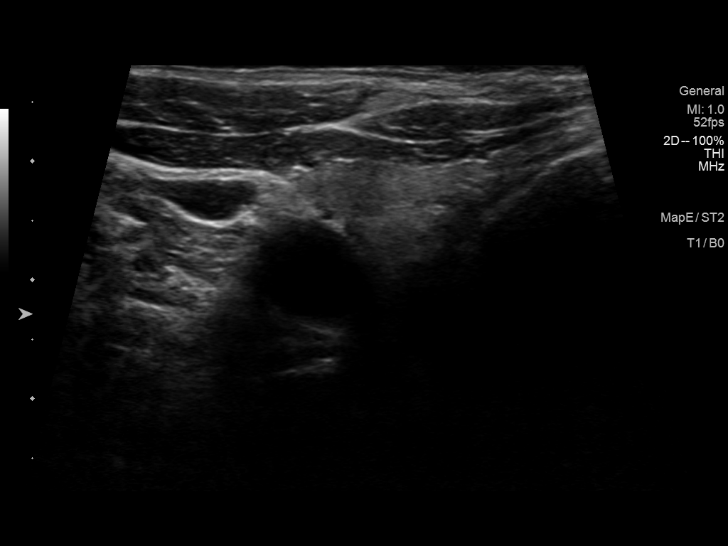
[im 34/41]
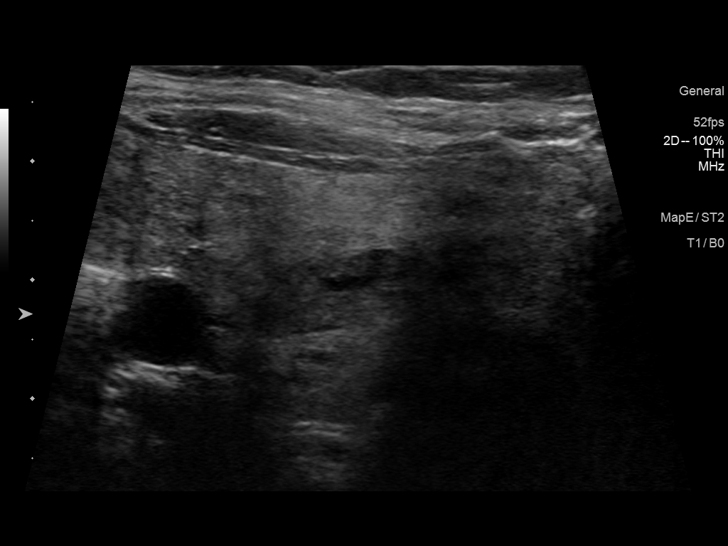
[im 37/41]
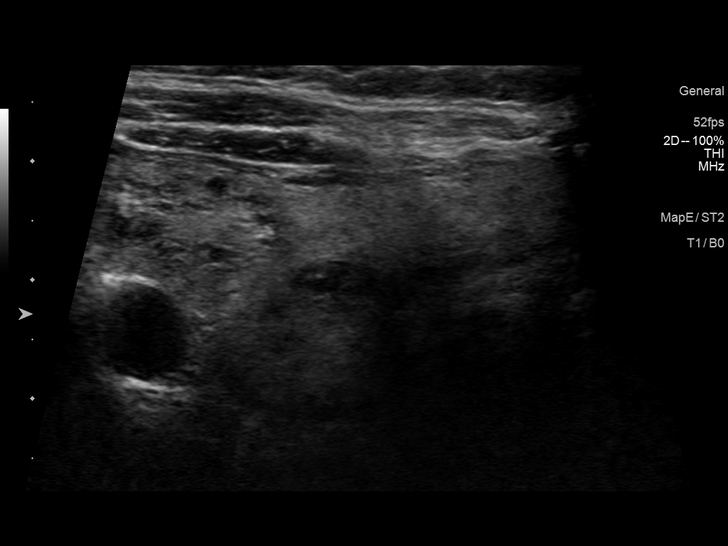
[im 41/41]
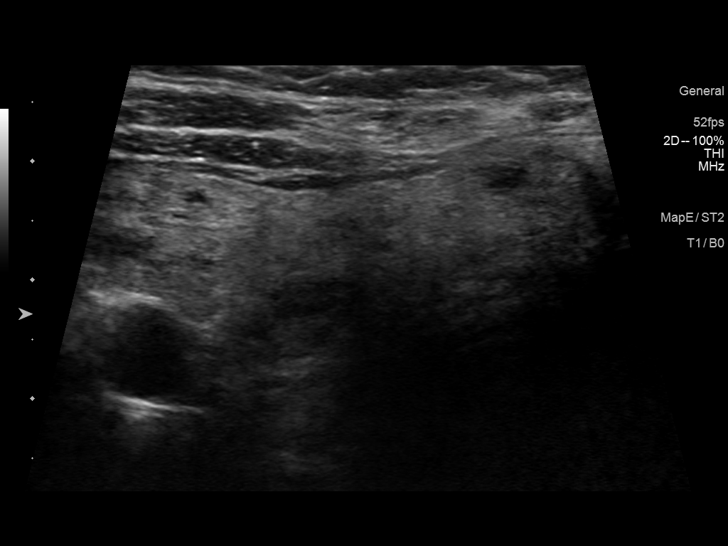

[13 of 25 positions shown; findings below may reference images not displayed]

FINDINGS: Parenchymal Echotexture: Moderately heterogenous

Isthmus: 0.3 cm thickness

Right lobe: 4.6 x 1.9 x 3 cm, previously 6.2 x 1.7 x

Left lobe: Surgically absent

_________________________________________________________

Estimated total number of nodules >/= 1 cm: 3

Number of spongiform nodules >/=  2 cm not described below (TR1): 0

Number of mixed cystic and solid nodules >/= 1.5 cm not described
below (TR2): 0

_________________________________________________________

No residual/recurrent tissue in the left thyroid lobectomy bed.

Nodule # 1: 1.7 cm superior right nodule, stable; this was
previously biopsied.

Nodule # 2:

Prior biopsy: No

Location: Right; mid

Maximum size: 1.7 cm; Other 2 dimensions: 1.2 x 1.4 cm, previously,
1.6 x 1.4 x 1.6 cm

Composition: solid/almost completely solid (2)

Echogenicity: isoechoic (1)

Shape: not taller-than-wide (0)

Margins: ill-defined (0)

Echogenic foci: none (0)

ACR TI-RADS total points: 3.

ACR TI-RADS risk category:  TR3 (3 points).

Significant change in size (>/= 20% in two dimensions and minimal
increase of 2 mm): No

Change in features: No

Change in ACR TI-RADS risk category: No

ACR TI-RADS recommendations:

*Given size (>/= 1.5 - 2.4 cm) and appearance, a follow-up
ultrasound in 1 year should be considered based on TI-RADS criteria.

_________________________________________________________

Nodule # 3:

Prior biopsy: No

Location: Right; inferior

Maximum size: 1.5 cm; Other 2 dimensions: 1.5 x 1.4 cm, previously,
1.5 x 1.5 x 1.2 cm

Composition: solid/almost completely solid (2)

Echogenicity: hypoechoic (2)

Shape: not taller-than-wide (0)

Margins: smooth (0)

Echogenic foci: none (0)

ACR TI-RADS total points: 4.

ACR TI-RADS risk category:  TR4 (4-6 points).

Significant change in size (>/= 20% in two dimensions and minimal
increase of 2 mm): No

Change in features: No

Change in ACR TI-RADS risk category: No

ACR TI-RADS recommendations:

**Given size (>/= 1.5 cm) and appearance, fine needle aspiration of
this moderately suspicious nodule should be considered based on
TI-RADS criteria.
IMPRESSION: 1. No residual/recurrent tissue post left hemithyroidectomy.
2. Stable right thyroid nodules. 1.5 cm inferior right nodule meets
criteria for FNA biopsy.
3. Recommend annual/biennial ultrasound follow-up of right mid
nodule as above, until stability x5 years confirmed.

The above is in keeping with the ACR TI-RADS recommendations - [HOSPITAL] [XY];[DATE].

## 2020-09-28 MED ORDER — LEVETIRACETAM 500 MG PO TABS: 500.0000 mg | ORAL_TABLET | Freq: Two times a day (BID) | ORAL | 11 refills | Status: DC

## 2020-09-28 NOTE — Telephone Encounter (Signed)
I have talked to [facility] . Pt's wife aware as well .    PT is Registered  Telephone  [phone] - fax [phone] .  Nurse and Doctor will Review    And call PT In 48 hours .   Patient is going to take MRI disc . But [facility] can see MRI . Sending a report .   I have talked to PT he is aware of all details and will be looking out for the call .   MY chart message sent .   Thanks Hinton Dyer

## 2020-09-28 NOTE — Telephone Encounter (Signed)
Medicare/cigna supp order sent to GI. No auth they will reach out to the patient to schedule.

## 2020-09-28 NOTE — Telephone Encounter (Signed)
Pt's wife, Matthieu Loftus (on [place]) called, want to let you I was not with today because I have bronchitis. If you need to discuss his visit with me you can call me. He may not remembered everything to relay to me.

## 2020-09-28 NOTE — Telephone Encounter (Signed)
Cory Mathis has spoken to the patient's wife since this call. See other phone note.

## 2020-09-28 NOTE — Progress Notes (Addendum)
Chief Complaint  Patient presents with  . New Patient (Initial Visit)    MoCA: 27/30. Recent abnormal MRI brain scans. Slow decline in memory over time. Last seen 09/17/2017 (MMSE 30/30 at that time). Daily headaches have resolved. He is more concerned about intermittent episodes of right-sided weakness, lasting 10-20 seconds. He has gait difficulty and uses a cane to assist with ambulation. He has continued to walk 2.5-3 miles every other day for exercise.       ASSESSMENT AND PLAN  Cory Mathis is a 70 y.o. male   Left hemisphere basal ganglia and adjacent white matter T2 hyperintensity  Brain biopsy showed hypocellular tissue, there is perivascular and parenchymal T-cell, B-cell, macrophage infiltration,   Clinical and imaging wise, patient responding very well to high-dose of steroid treatment,  Differentiation diagnosis including CNS lymphoma  [facility] referral to [facility]  Reported episode of sudden onset of right leg weakness,  Possible partial seizure  MRI of the brain with without contrast again, EEG, Keppra 500 mg twice daily   DIAGNOSTIC DATA (LABS, IMAGING, TESTING) - I reviewed patient records, labs, notes, testing and imaging myself where available.   HISTORICAL Cory Mathis is a 70 year old male, seen in refer by primary care doctor Cory Mathis, for evaluation short-term memory loss, initial evaluation was on July 25, 2017.  I reviewed and summarized the referring note, he has past history of anxiety, hearing loss, goiter status post resection, hyperlipidemia, hypertension, he is a retired Financial trader from [employer], moved to [place] in April 2015.  He also reported a history of fever of unknown etiology, had 2 episodes 20 years apart, most recent episode was in 2014, fever up to 103, lasting for couple weeks, without clear etiology found, he was seen by different specialist, including rheumatologist, ID specialist, symptoms  eventually improved by steroid tapering dose, treatment lasted for a few months.  Since 2015, he noticed mild memory loss, he can still keep very complicated spreadsheets without difficulty driving without loss, but in his daily activities, he let the water running for hours, sometimes forget why he goes to upstairs,  He also complains of chronic insomnia, gradually getting worse, was seen by sleep specialist Dr. Elenore Rota, was diagnosis of obstructive sleep apnea, using CPAP machine, also taking titrating dose of Ambien 12.5 mg every night, but sometimes it does not work.  UPDATE September 17 2017: He got fever again at the end of February, had extensive laboratory evaluation by his primary care doctor Via, was also seen by infectious disease Dr. Megan Salon, no etiology was found, he was treated with prednisone, whenever he taper off the prednisone, his fever come back at 4 PM, now is on 25 mg daily, continue complains of difficulty sleeping at nighttime, taking Ambien 12.5 mg daily We have personally reviewed MRI of the brain without contrast in February 2019 that was normal  Laboratory evaluation showed normal CBC CMP, TSH, fasting lipid profile, LDL was 86  Update September 28, 2020 He lost to follow-up since April 2019, on October 2021, he complains of increased headache, mental confusion, right arm weakness, CT head and later MRI of the brain with without contrast March 30, 2020 showed 4.5 cm infiltrating mass centered at the base of the brain on the left with involvement of the basal ganglion, nonradiating white matter tracts, mass-effect upon left lateral ventricle, no midline shift, which 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  Emelda Brothers on April 06, 2020, pathology reported hypercellular brain tissue with inflammatory infiltration,   The biopsy shows hypercellular brain tissue with a brisk perivascular  and parenchymal  inflammatory infiltrate. The inflammatory cells include  CD3 positive T cells, CD20 positive B cells and CD68 positive  macrophages. There is no evidence of vasculitis or granulomatous  inflammation, and organisms are not identified. IDH1 R132H,  toxoplasmosis, SV40 and HSV immunohistochemical stains are negative.  The differential diagnosis may include inflammatory or infectious  etiologies, demyelinating disease and others. The histology does not  suggest a glial neoplasm, although due to clinical concern for  glioblastoma we are performing EGFR and TERT studies to further  evaluate. If the lesion progresses or if there is concern for  potentially undersampled lesion consider repeat biopsy if clinically  indicated EGFR does not exhibit amplification, the number of cells with again is less than 15%,  He was managed by Dr. 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  Patient to complete Decadron 2 mg from November 18 to May 09, 2020, steroid free for few days, then developed a rash on his face, and neck, did not improve with hydrocortisone cream, on May 17, 2020, he was given  hydrocortisone 20 mg in the morning, 30 tablets,  He presented to emergency room on June 19, 2020 for balance issues, lightheadedness, fall, worsening headache, he did not get second brain biopsy because significant improvement with steroid treatment continue MRI surveillance  Since February 2022, he also began to experience episode of sudden onset loss control of his leg, fell to the ground, less than 1 minute, Dr. Mickeal Skinner started Keppra 500 mg twice daily, patient only take it for 3 days, complains of dizziness, increased leg weakness, Keppra was stopped, he was given another trial of dexamethasone on July 19, 2020 4 mg daily for 1 week, reduce to 2 mg daily on July 27, 2020   At most recent follow-up with Dr. Mickeal Skinner August 09, 2020, patient and his wife describe ongoing deficit including short-term memory loss, occasional loss of balance, dropped things from his right hand, improved  headache control  MRI of brain with without contrast on June 19, 2020, slight interval increase in T2/flair signal abnormality involving left basal ganglia compared to previous MRI on April 30, 2020,  MRI of the brain with without contrast August 06, 2020, this is following her second round of Decadron treatment, again substantial decrease of T2 hyperdensity in the left basal ganglion and white matter, that correlate with steroid treatment   Extensive laboratory evaluation showed normal or negative  CMP, UDS, elevated WBC 11.7, normal hemoglobin 14.2, negative Lyme titer, C-reactive protein, ANA, TSH, LDL 47  REVIEW OF SYSTEMS:  Full 14 system review of systems performed and notable only for as above All other review  of systems were negative.  PHYSICAL EXAM:   Vitals:   09/28/20 1110  BP: (!) 142/75  Pulse: (!) 59   Not recorded     There is no height or weight on file to calculate BMI.  PHYSICAL EXAMNIATION:  Gen: NAD, conversant, well nourised, well groomed                      Cardiovascular: Regular rate rhythm, no peripheral edema, warm, nontender. Eyes: Conjunctivae clear without exudates or hemorrhage Neck: Supple, no carotid bruits. Pulmonary: Clear to auscultation bilaterally   NEUROLOGICAL EXAM:  MENTAL STATUS: Speech:   Montreal Cognitive Assessment  09/28/2020  Visuospatial/ Executive (0/5) 5  Naming (0/3) 3  Attention: Read list of digits (0/2) 2  Attention: Read list of letters (0/1) 1  Attention: Serial 7 subtraction starting at 100 (0/3) 3  Language: Repeat phrase (0/2) 2  Language : Fluency (0/1) 0  Abstraction (0/2) 2  Delayed Recall (0/5) 3  Orientation (0/6) 6  Total 27  Adjusted Score (based on education) 27     CRANIAL NERVES: CN II: Visual fields are full to confrontation. Pupils are round equal and briskly reactive to light. CN III, IV, VI: extraocular movement are normal. No ptosis. CN V: Facial sensation is intact to light touch CN VII: Face is symmetric with normal eye closure  CN VIII: Hearing is normal to causal conversation. CN IX, X: Phonation is normal. CN XI: Head turning and shoulder shrug are intact  MOTOR: Fixation of right arm upon rapid rotating movement, mild right upper extremity proximal muscle weakness  REFLEXES: Hyperreflexia of right upper and lower extremity  SENSORY: Intact to light touch, pinprick and vibratory sensation are intact in fingers and toes.  COORDINATION: There is no trunk or limb dysmetria noted.  GAIT/STANCE: He can get up from seated position arm crossed, decreased right arm swing, dragging right leg slightly Romberg is absent.  ALLERGIES: Allergies  Allergen Reactions  . Other     Mints - makes him sneeze  . Sulfa Antibiotics Hives and Rash  . Sulfasalazine Hives and Other (See Comments)    other  . Metronidazole Other (See Comments)    Pt reported he was light headed for 2 weeks.      HOME MEDICATIONS: Current Outpatient Medications  Medication Sig Dispense  Refill  . acetaminophen (TYLENOL) 500 MG tablet Take 1,000 mg by mouth 2 (two) times daily as needed for headache.    Marland Kitchen amLODipine (NORVASC) 5 MG tablet TAKE 1 TABLET(5 MG) BY MOUTH TWICE DAILY (Patient taking differently: Take 5 mg by mouth daily.) 180 tablet 3  . carboxymethylcellulose (REFRESH PLUS) 0.5 % SOLN Place 1 drop into both eyes 4 (four) times daily as needed (dry eyes).    . eszopiclone (LUNESTA) 2 MG TABS tablet Take 2 mg by mouth at bedtime.    . memantine (NAMENDA) 10 MG tablet Take 10 mg by mouth 2 (two) times daily.    . rosuvastatin (CRESTOR) 5 MG tablet TAKE 1/2 TABLET(2.5 MG) BY MOUTH DAILY 45 tablet 2  . XARELTO 20 MG TABS tablet TAKE 1 TABLET(20 MG) BY MOUTH DAILY WITH SUPPER (Patient taking differently: Take 20 mg by mouth daily with supper.) 30 tablet 6   No current facility-administered medications for this visit.    PAST MEDICAL HISTORY: Past Medical History:  Diagnosis Date  . BPH (benign prostatic hyperplasia)   . Diverticulosis of colon   . Dysrhythmia 2019   Afib  .  GERD (gastroesophageal reflux disease)    prn med. control  . Headache   . History of adenomatous polyp of colon    03-29-2007  tubular adenoma/  08-07-2016  hyperplastic and tubular adenoma's  . History of alcoholic gastritis    57-26-2035  gastritis, esophagitis, peptic duodenitis  . History of colonic diverticulitis    01-31-2016  resolved w/ medications  . History of Helicobacter pylori infection    10-30-2008  . 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 Hypertension   . Internal hemorrhoids   . Memory loss   . OSA on CPAP    CPAP nightly  . Right thyroid nodule   . Urinary retention 12/13/2016    PAST SURGICAL HISTORY: Past Surgical History:  Procedure Laterality Date  . APPLICATION OF CRANIAL NAVIGATION N/A 04/06/2020   Procedure: APPLICATION OF CRANIAL NAVIGATION;  Surgeon: Judith Part, MD;  Location: [facility];   Service: Neurosurgery;  Laterality: N/A;  . BRAIN SURGERY    . CARDIOVERSION N/A 12/23/2018   Procedure: CARDIOVERSION;  Surgeon: Buford Dresser, MD;  Location: [facility] ENDOSCOPY;  Service: Cardiovascular;  Laterality: N/A;  . COLONOSCOPY  last one 08-07-2016  . EYE SURGERY    . FRAMELESS  BIOPSY WITH BRAINLAB Left 04/06/2020   Procedure: Left Stereotactic brain biopsy with brainlab;  Surgeon: Judith Part, MD;  Location: [facility];  Service: Neurosurgery;  Laterality: Left;  . HEMORRHOID BANDING  10-20-2016;  09-11-2016  . HERNIA REPAIR    . KNEE ARTHROSCOPY Bilateral    10 + yrs ago  . LASIK Bilateral   . PILONIDAL CYST EXCISION    . THYROIDECTOMY Left 2013  . TRANSURETHRAL RESECTION OF PROSTATE N/A 01/04/2017   Procedure: TRANSURETHRAL RESECTION OF THE PROSTATE (TURP);  Surgeon: Franchot Gallo, MD;  Location[facility];  Service: Urology;  Laterality: N/A;  . UPPER GASTROINTESTINAL ENDOSCOPY  10/30/2008  . WISDOM TOOTH EXTRACTION      FAMILY HISTORY: Family History  Problem Relation Age of Onset  . Heart attack Mother        68's  . Other Father        atherosclerosis - 17's  . Prostate cancer Maternal Grandfather   . Other Sister        brain tumor - unsure if it was cancer - 60's  . Colon cancer Neg Hx   . Esophageal cancer Neg Hx   . Pancreatic cancer Neg Hx   . Rectal cancer Neg Hx   . Stomach cancer Neg Hx     SOCIAL HISTORY: Social History   Socioeconomic History  . Marital status: Married    Spouse name: Not on file  . Number of children: 0  . Years of education: 16+  . Highest education level: Bachelor's degree (e.g., BA, AB, BS)  Occupational History  . Occupation: Retired  Tobacco Use  . Smoking status: Former Research scientist (life sciences)  . Smokeless tobacco: Never Used  Vaping Use  . Vaping Use: Never used  Substance and Sexual Activity  . Alcohol use: Yes    Comment: social   . Drug use: No  . Sexual activity: Not on file  Other Topics  Concern  . Not on file  Social History Narrative   Left-handed.   1.5 cups caffeine per day.   Lives at home with his wife.   Social Determinants of Health   Financial Resource Strain: Not on file  Food Insecurity:  Not on file  Transportation Needs: Not on file  Physical Activity: Not on file  Stress: Not on file  Social Connections: Not on file  Intimate Partner Violence: Not on file    Total time spent reviewing the chart, obtaining history, examined patient, ordering tests, documentation, consultations and family, care coordination was 64 minutes     Marcial Pacas, M.D. Ph.D.  [facility] [address], [place], [place] [address] Ph: [phone] Fax: [phone]  CC:  Vernie Shanks, MD [place],  [place] [address]  Vernie Shanks, MD

## 2020-09-29 NOTE — Telephone Encounter (Signed)
Patient called stating that Dr. Krista Blue told him that he needs to have his MRI at [facility] and not at [facility] because he got a call from GI. . I informed him that she did not tell me that and that is why the order was sent to GI.Marland KitchenHe stated that he needs a call from them within 48 hours.  I called the [facility] where his referral was sent to ask them if they do MRI's there or which [facility] location that they used to schedule MRI's. They sent a message to the nurse that is looking at the referral case and I left my direct number to get back to me. But they stated that normally MRI's are done outside and they bring the CD/results with them because they don't do MRI's unless they are a patient of theirs.   I called [facility] department at [phone] and spoke with [facility] and she stated that they schedule at the different [facility] MRI location. Right now order is pending the work in Chemical engineer.  Fax number to the [facility] Radiology department is [phone].

## 2020-10-01 ENCOUNTER — Other Ambulatory Visit: Payer: Self-pay | Admitting: Physician Assistant

## 2020-10-01 DIAGNOSIS — E042 Nontoxic multinodular goiter: Secondary | ICD-10-CM

## 2020-10-01 DIAGNOSIS — J301 Allergic rhinitis due to pollen: Secondary | ICD-10-CM | POA: Diagnosis not present

## 2020-10-01 DIAGNOSIS — J3089 Other allergic rhinitis: Secondary | ICD-10-CM | POA: Diagnosis not present

## 2020-10-01 DIAGNOSIS — J3081 Allergic rhinitis due to animal (cat) (dog) hair and dander: Secondary | ICD-10-CM | POA: Diagnosis not present

## 2020-10-04 ENCOUNTER — Telehealth: Payer: Self-pay | Admitting: Neurology

## 2020-10-04 DIAGNOSIS — J301 Allergic rhinitis due to pollen: Secondary | ICD-10-CM | POA: Diagnosis not present

## 2020-10-04 DIAGNOSIS — J3089 Other allergic rhinitis: Secondary | ICD-10-CM | POA: Diagnosis not present

## 2020-10-04 NOTE — Telephone Encounter (Signed)
I did refer him to [facility]   [facility] [place], [place] [address]  Appointments [phone] Office [phone] Fax [phone]  Please make it un urgent refer, please check with patient if he got any call from [place], if not, it is ok to schedule MRI of brain w/wo at [facility],   Please also help him to get all MRI brain on CD before he goes to [facility]

## 2020-10-04 NOTE — Telephone Encounter (Signed)
The MRI was faxed to a scheduling radiology department. Nurse Polo Riley with the [facility] tumor center called stating that they don't just order MRI's without seeing the patient first and I informed her that I knew that but the patient just wants to have it with them or with [facility]. She informed me to fax the order to her and the reason on why the MRI is needed. Her fax # [phone] her ph # [phone]. She also stated if Dr. Krista Blue wanted to talk to Dr. Sandi Carne about the reasoning of order the MRI his number is [phone].

## 2020-10-04 NOTE — Telephone Encounter (Signed)
With in one week would be fine, do not have to be STAT

## 2020-10-04 NOTE — Telephone Encounter (Signed)
I have talked with [facility], Dessa Phi, if we need to contact Dr. Domingo Cocking, may page operator, for Dr. Sandi Carne [phone]  If MRI of brain is done at [facility], the date of MRI that is completed, [facility] can pull up and see MRI of brain that way.  Raquel Sarna:  Please arrange MRI of brain w/wo at [facility] system quickly. The data needs to be at [employer] for [facility] to see the films

## 2020-10-04 NOTE — Telephone Encounter (Signed)
If you would like this to be stat with [facility] a new MRI order needs to be put in as stat.

## 2020-10-04 NOTE — Telephone Encounter (Signed)
Noted . [facility]  Called on 09/28/2020 as Urgent [facility] called and left message's  09/29/2020  10/01/2020 to try to scheduled PT . RN Dessa Phi  Referral Notes were received 09/28/2020   With ok conformation  And called was placed to patient's wife . Relaying disc could be picked up from GI   Spoke to Patient s wife and and relayed all this information .   Shirlean Mylar Stated That Patient Wanted to Have his MRI there First  Which confused her .   Dessa Phi RN is going to to get PT scheduled with Oncology ASAP and call me back with apt time and date .   I tried to explain to PT and his wife but they were not getting along over the telephone I will call them back when I here back from [place] .

## 2020-10-05 NOTE — Telephone Encounter (Signed)
I have the patient scheduled at [facility] for Friday 10/08/20 to arrive at 7:30 AM.   I called the patient and spoke with the patient and his wife to inform him of this appointment. But they informed me that they have an appointment already made for an MRI with [facility] in [facility] for Thursday 10/14/20. The patient wife stated that Hinton Dyer said to not cancel that appointment. They stated they are very confused on what is going on and still have not heard about an appointment for his referral with [facility] with the [place] . The patient wants to know does he need to keep his appointment in [place] with [facility] on 10/14/20 or go to [place] [place] on Friday 10/08/20.   Also the patient wife stated that last week someone from [facility] called them but was asking them all these crazy questions on why he is needing to be treated there and why is he not be treated somewhere in [place]. The patient wife just states she would like to know what is going on with everything.   *I copied this message in telephone note from 09/28/20 as well.

## 2020-10-05 NOTE — Telephone Encounter (Signed)
I have the patient scheduled at [facility] for Friday 10/08/20 to arrive at 7:30 AM.   I called the patient and spoke with the patient and his wife to inform him of this appointment. But they informed me that they have an appointment already made for an MRI with [facility] in [facility] for Thursday 10/14/20. The patient wife stated that Hinton Dyer said to not cancel that appointment. They stated they are very confused on what is going on and still have not heard about an appointment for his referral with [facility] with the [place] . The patient wants to know does he need to keep his appointment in [place] with [facility] on 10/14/20 or go to [facility] on Friday 10/08/20.   Also the patient wife stated that last week someone from [facility] called them but was asking them all these crazy questions on why he is needing to be treated there and why is he not be treated somewhere in [place]. The patient wife just states she would like to know what is going on with everything.   *I copied this message in telephone note from 10/04/20 as well.

## 2020-10-05 NOTE — Telephone Encounter (Signed)
I have called, and talk with patient, he is to keep his MRI appointment on October 08, 2020, we will review the film, and also informed [facility] to review the film, potential appointment with [facility] will be made after after reviewing repeat MRI of brain on April 29

## 2020-10-05 NOTE — Telephone Encounter (Signed)
Please advise . Do you need me to see anything else on [facility] Referral after speaking to them ? Thanks Hinton Dyer

## 2020-10-06 ENCOUNTER — Other Ambulatory Visit (HOSPITAL_COMMUNITY)
Admission: RE | Admit: 2020-10-06 | Discharge: 2020-10-06 | Disposition: A | Discharge status: Home / Self Care | Payer: Medicare Other | Source: Ambulatory Visit | Attending: Student | Admitting: Student

## 2020-10-06 ENCOUNTER — Ambulatory Visit
Admission: RE | Admit: 2020-10-06 | Discharge: 2020-10-06 | Disposition: A | Discharge status: Home / Self Care | Payer: Medicare Other | Source: Ambulatory Visit | Attending: Physician Assistant | Admitting: Physician Assistant

## 2020-10-06 DIAGNOSIS — E041 Nontoxic single thyroid nodule: Secondary | ICD-10-CM | POA: Diagnosis not present

## 2020-10-06 DIAGNOSIS — E042 Nontoxic multinodular goiter: Secondary | ICD-10-CM

## 2020-10-06 IMAGING — US US FNA BIOPSY THYROID 1ST LESION
1 series · 13 of 22 positions shown · non-contrast
Comparison: US THYROID [DATE]

MEDICATIONS:
10 mL 1% lidocaine

COMPLICATIONS:
None immediate.

INDICATION: Indeterminate thyroid nodule of the right inferior thyroid. Request
is made for fine-needle aspiration of indeterminate nodule.

EXAM:
ULTRASOUND GUIDED FINE NEEDLE ASPIRATION OF INDETERMINATE THYROID
NODULE
TECHNIQUE: Informed written consent was obtained from the patient after a
discussion of the risks, benefits and alternatives to treatment.
Questions regarding the procedure were encouraged and answered. A
timeout was performed prior to the initiation of the procedure.

[Series 1: us fna biopsy thyroid 1st lesion · 0.07mm/px · 22 acquisitions, 13 frames shown]
[im 1/22]
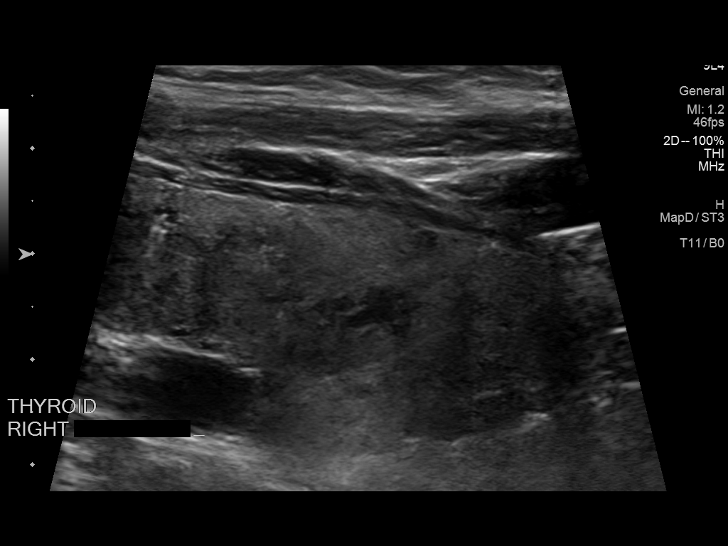
[im 3/22]
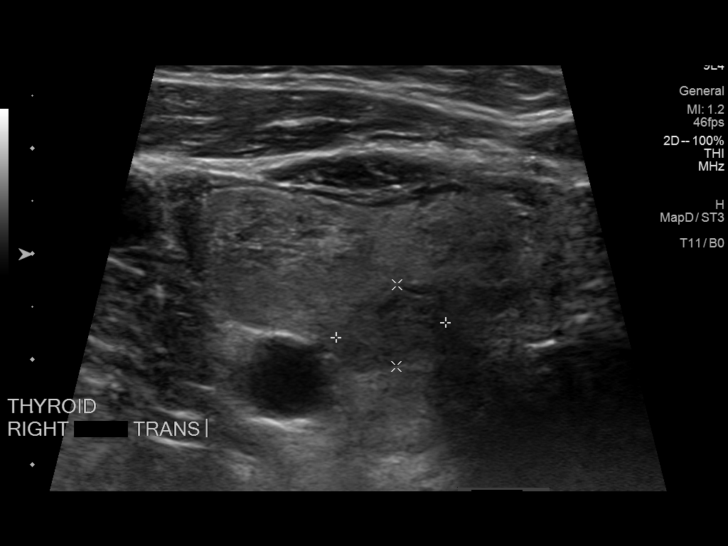
[im 5/22]
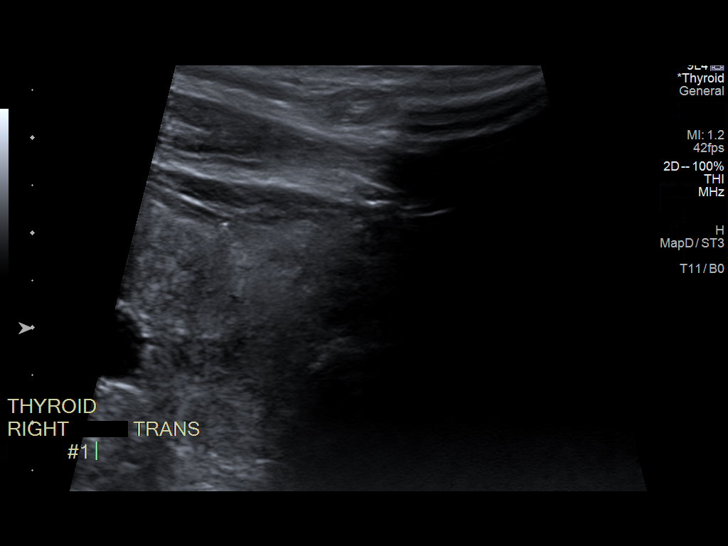
[im 6/22]
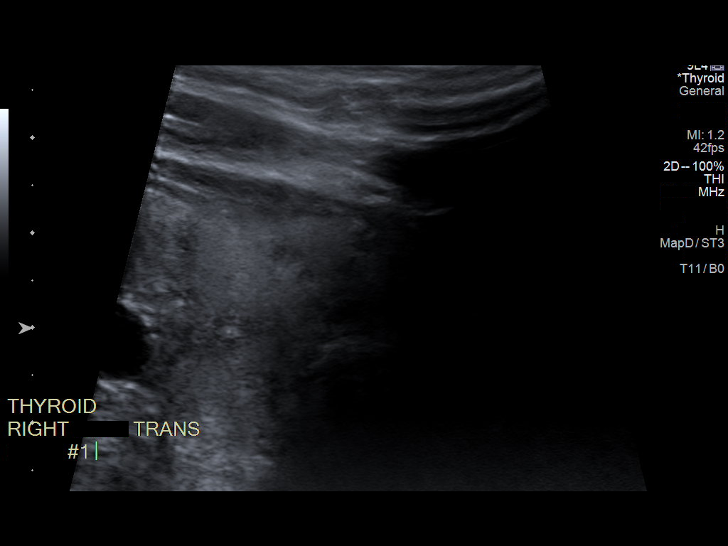
[im 8/22]
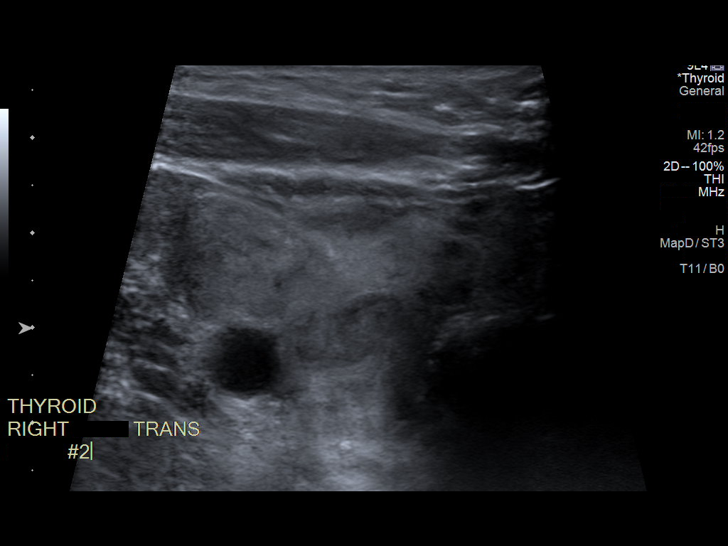
[im 10/22]
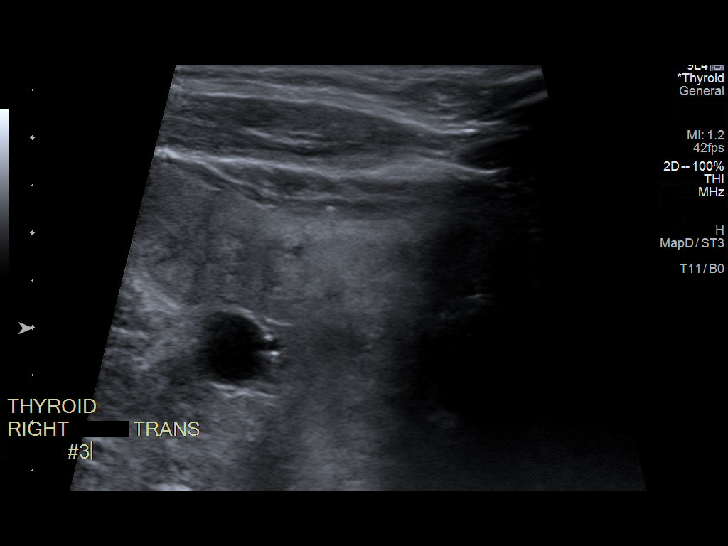
[im 12/22]
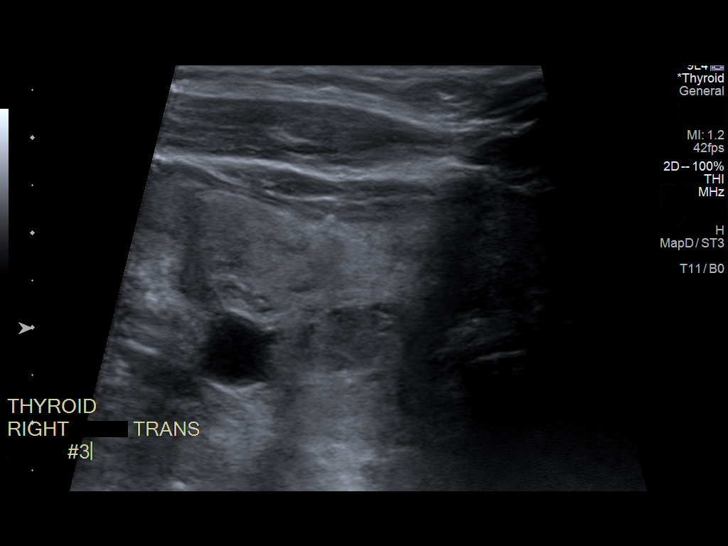
[im 13/22]
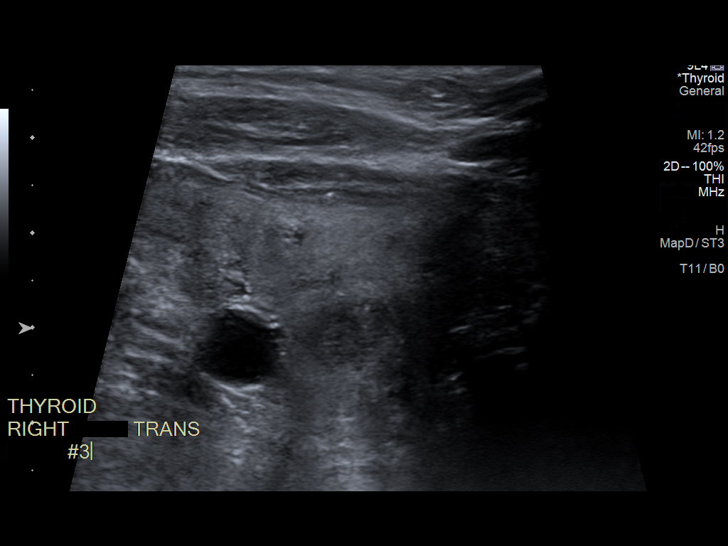
[im 15/22]
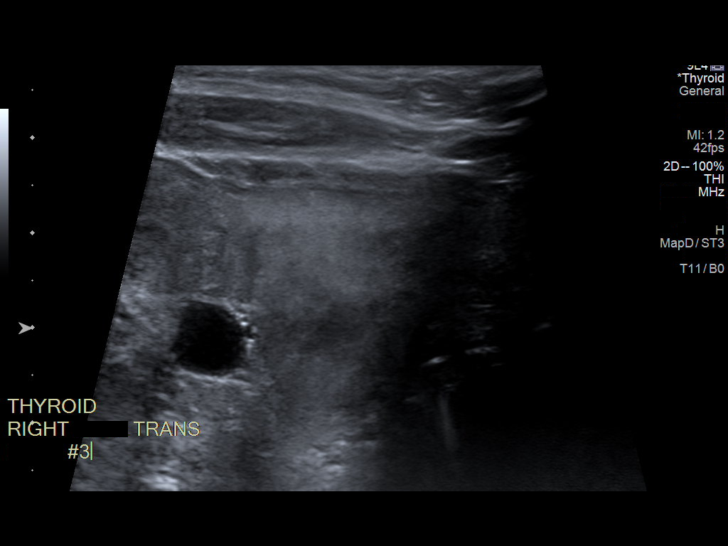
[im 17/22]
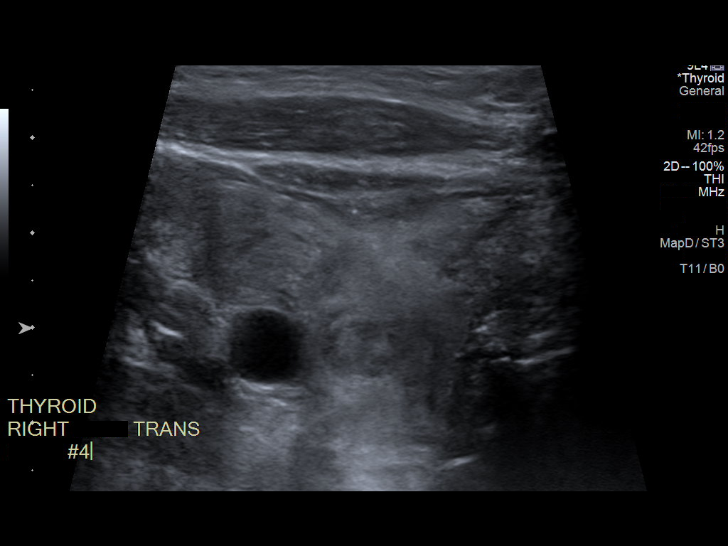
[im 18/22]
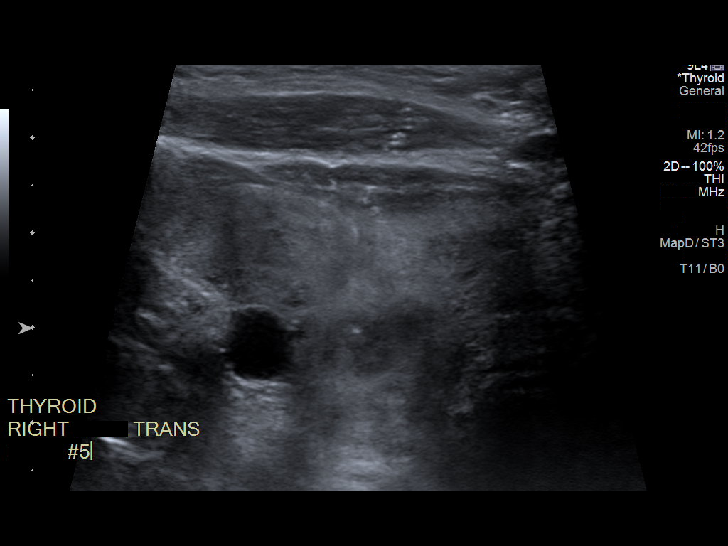
[im 20/22]
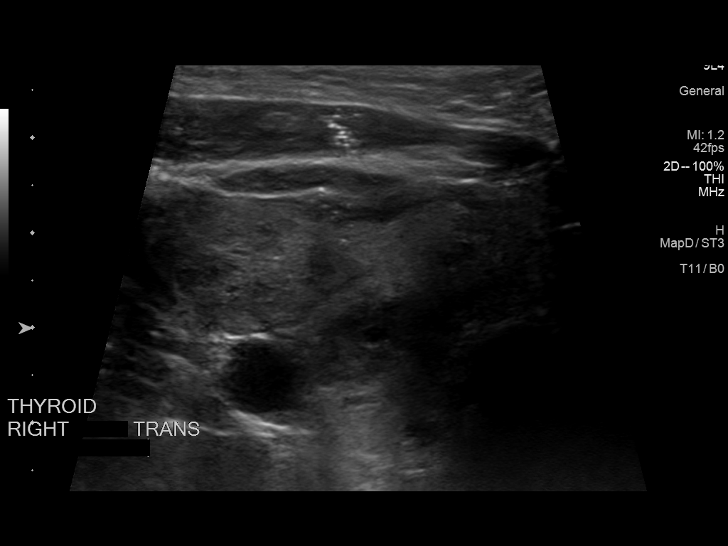
[im 22/22]
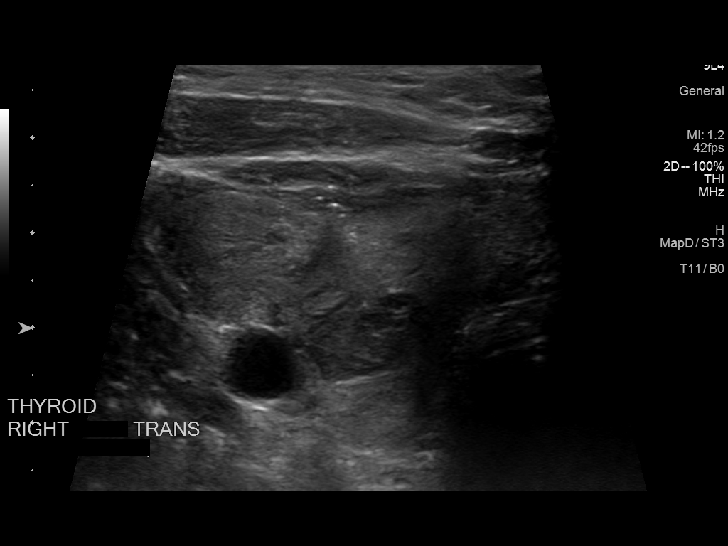

[13 of 22 positions shown; findings below may reference images not displayed]

Pre-procedural ultrasound scanning demonstrated unchanged size and
appearance of the indeterminate nodule within the right inferior
thyroid.

The procedure was planned. The neck was prepped in the usual sterile
fashion, and a sterile drape was applied covering the operative
field. A timeout was performed prior to the initiation of the
procedure. Local anesthesia was provided with 1% lidocaine.

Under direct ultrasound guidance, 5 FNA biopsies were performed of
the right inferior thyroid nodule with a 25 gauge needle. Multiple
ultrasound images were saved for procedural documentation purposes.
The samples were prepared and submitted to pathology. Sample was
also prepared for Afirma testing.

Limited post procedural scanning was negative for hematoma or
additional complication. Dressings were placed. The patient
tolerated the above procedures procedure well without immediate
postprocedural complication.
FINDINGS: Nodule reference number based on prior diagnostic ultrasound: 3

Maximum size: 1.5 cm

Location: Right; Inferior

ACR TI-RADS risk category: TR4 (4-6 points)

Reason for biopsy: meets ACR TI-RADS criteria

Ultrasound imaging confirms appropriate placement of the needles
within the thyroid nodule.
IMPRESSION: Technically successful ultrasound guided fine needle aspiration of
right inferior thyroid nodule.

## 2020-10-07 DIAGNOSIS — G8191 Hemiplegia, unspecified affecting right dominant side: Secondary | ICD-10-CM | POA: Diagnosis not present

## 2020-10-07 DIAGNOSIS — J3089 Other allergic rhinitis: Secondary | ICD-10-CM | POA: Diagnosis not present

## 2020-10-07 DIAGNOSIS — R413 Other amnesia: Secondary | ICD-10-CM | POA: Diagnosis not present

## 2020-10-07 DIAGNOSIS — J301 Allergic rhinitis due to pollen: Secondary | ICD-10-CM | POA: Diagnosis not present

## 2020-10-07 DIAGNOSIS — D496 Neoplasm of unspecified behavior of brain: Secondary | ICD-10-CM | POA: Diagnosis not present

## 2020-10-07 LAB — CYTOLOGY - NON PAP

## 2020-10-08 ENCOUNTER — Ambulatory Visit (HOSPITAL_COMMUNITY)
Admission: RE | Admit: 2020-10-08 | Discharge: 2020-10-08 | Disposition: A | Discharge status: Home / Self Care | Payer: Medicare Other | Source: Ambulatory Visit | Attending: Neurology | Admitting: Neurology

## 2020-10-08 ENCOUNTER — Other Ambulatory Visit: Payer: Self-pay

## 2020-10-08 ENCOUNTER — Telehealth (INDEPENDENT_AMBULATORY_CARE_PROVIDER_SITE_OTHER): Payer: Medicare Other | Admitting: Cardiovascular Disease

## 2020-10-08 ENCOUNTER — Telehealth: Payer: Self-pay | Admitting: Cardiovascular Disease

## 2020-10-08 DIAGNOSIS — C719 Malignant neoplasm of brain, unspecified: Secondary | ICD-10-CM | POA: Diagnosis not present

## 2020-10-08 DIAGNOSIS — G939 Disorder of brain, unspecified: Secondary | ICD-10-CM | POA: Diagnosis not present

## 2020-10-08 DIAGNOSIS — R002 Palpitations: Secondary | ICD-10-CM | POA: Diagnosis not present

## 2020-10-08 DIAGNOSIS — I4891 Unspecified atrial fibrillation: Secondary | ICD-10-CM | POA: Diagnosis not present

## 2020-10-08 DIAGNOSIS — G8191 Hemiplegia, unspecified affecting right dominant side: Secondary | ICD-10-CM | POA: Diagnosis not present

## 2020-10-08 DIAGNOSIS — Z8679 Personal history of other diseases of the circulatory system: Secondary | ICD-10-CM | POA: Diagnosis not present

## 2020-10-08 DIAGNOSIS — I69351 Hemiplegia and hemiparesis following cerebral infarction affecting right dominant side: Secondary | ICD-10-CM | POA: Diagnosis not present

## 2020-10-08 DIAGNOSIS — D496 Neoplasm of unspecified behavior of brain: Secondary | ICD-10-CM | POA: Diagnosis not present

## 2020-10-08 IMAGING — MR MR HEAD WO/W CM
16 series · 48 of 48 positions shown · IV contrast (gadavist)
Comparison: [DATE]

CLINICAL DATA: Brain tumor follow-up.

EXAM:
MRI HEAD WITHOUT AND WITH CONTRAST
TECHNIQUE: Multiplanar, multiecho pulse sequences of the brain and surrounding
structures were obtained without and with intravenous contrast.
CONTRAST:  10mL GADAVIST GADOBUTROL 1 MMOL/ML IV SOLN

[Series 5: DWI · axial · 3.0mm · 1.36mm/px · z∈[-34,+111]mm · 7 of 102 slices shown (1 of 2)]
[im 1/102]
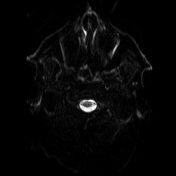
[im 17/102]
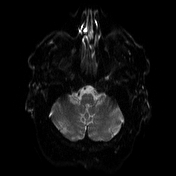
[im 34/102]
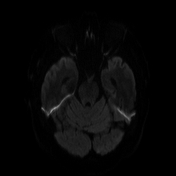
[im 51/102]
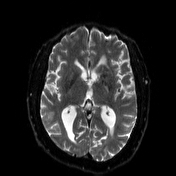
[im 68/102]
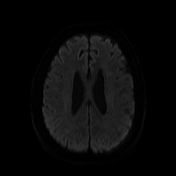
[im 85/102]
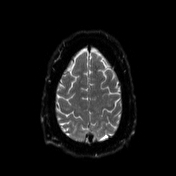
[im 102/102]
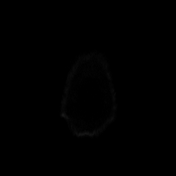

[Series 6: DWI · axial · 3.0mm · 1.36mm/px · z∈[-34,+111]mm · 3 of 50 slices shown (2 of 2)]
[im 1/50]
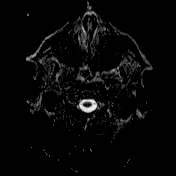
[im 25/50]
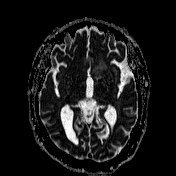
[im 50/50]
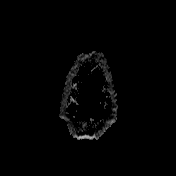

[Series 7: T1 · sagittal · 5.0mm · 0.75mm/px · 1 of 24 slices shown (1 of 4)]
[im 1/24]
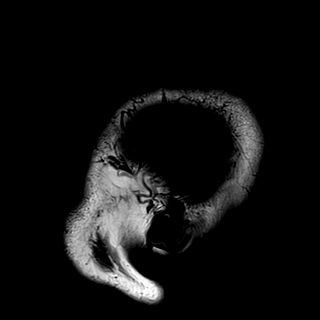

[Series 8: T2 · axial · 5.0mm · 0.62mm/px · 1 of 26 slices shown]
[im 1/26]
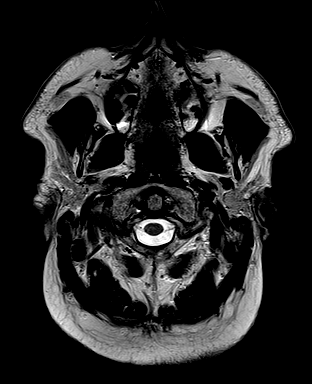

[Series 9: mip_images(sw) · axial · 24.0mm · 0.75mm/px · z∈[-31,+106]mm · 3 of 49 slices shown]
[im 1/49]
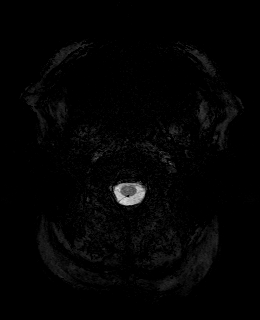
[im 25/49]
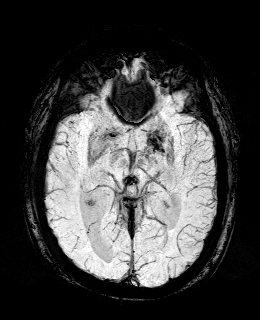
[im 49/49]
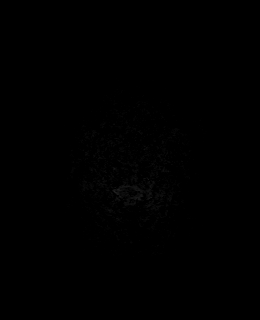

[Series 10: swi_images · axial · 3.0mm · 0.75mm/px · z∈[-41,+116]mm · 3 of 56 slices shown]
[im 1/56]
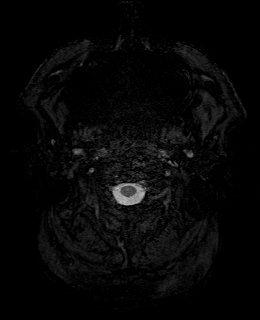
[im 28/56]
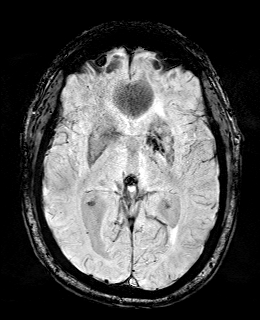
[im 56/56]
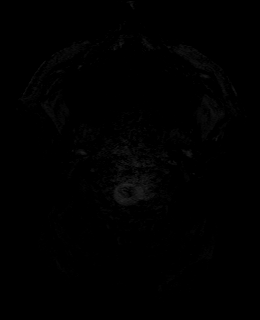

[Series 11: FLAIR · axial · 3.0mm · 0.75mm/px · z∈[-35,+110]mm · 3 of 52 slices shown]
[im 1/52]
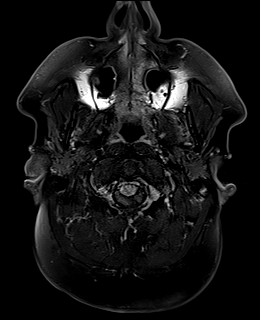
[im 26/52]
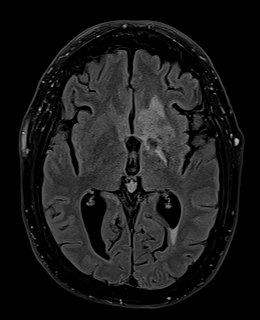
[im 52/52]
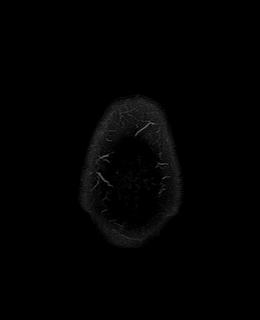

[Series 12: T1 · axial · 1.0mm · 0.94mm/px · z∈[-28,+108]mm · 8 of 144 slices shown (2 of 4)]
[im 1/144]
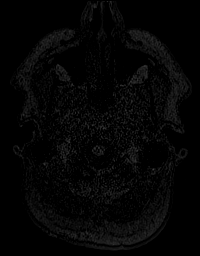
[im 21/144]
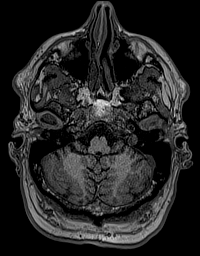
[im 41/144]
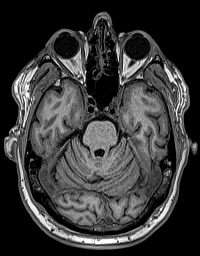
[im 62/144]
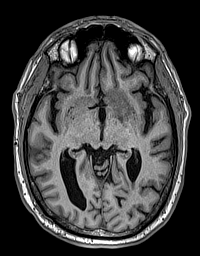
[im 82/144]
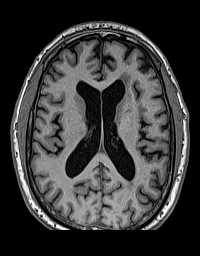
[im 103/144]
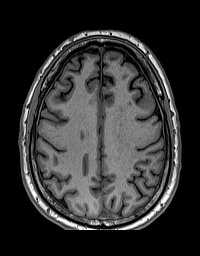
[im 123/144]
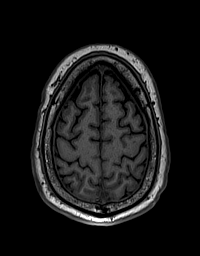
[im 144/144]
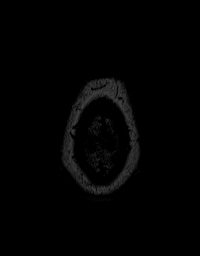

[Series 13: cor dwi_tracew · coronal · 5.0mm · 1.53mm/px · 3 of 52 slices shown]
[im 1/52]
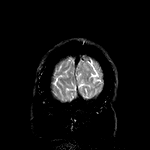
[im 26/52]
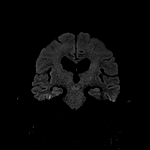
[im 52/52]
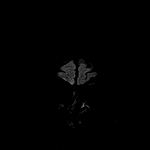

[Series 14: cor dwi_adc · coronal · 5.0mm · 1.53mm/px · 1 of 26 slices shown]
[im 1/26]
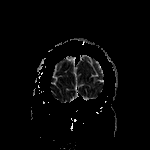

[Series 15: T2 post-contrast · coronal · 5.0mm · 0.57mm/px · 1 of 26 slices shown]
[im 1/26]
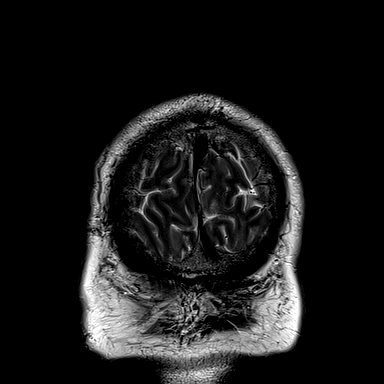

[Series 16: T1 post-contrast · axial · 1.0mm · 0.94mm/px · z∈[-28,+108]mm · 8 of 144 slices shown (1 of 3)]
[im 1/144]
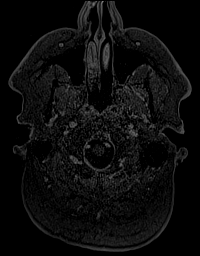
[im 21/144]
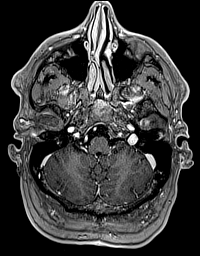
[im 41/144]
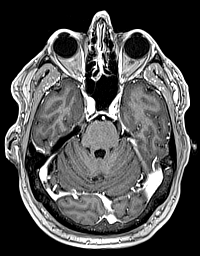
[im 62/144]
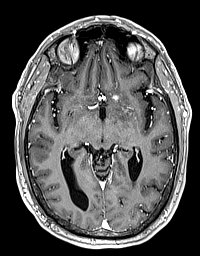
[im 82/144]
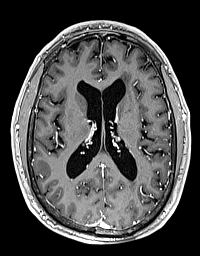
[im 103/144]
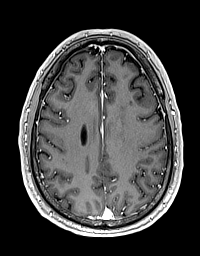
[im 123/144]
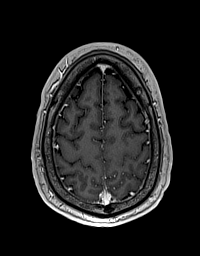
[im 144/144]
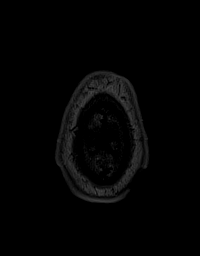

[Series 17: T1 · sagittal · 4.0mm · 0.94mm/px · 2 of 30 slices shown (3 of 4)]
[im 1/30]
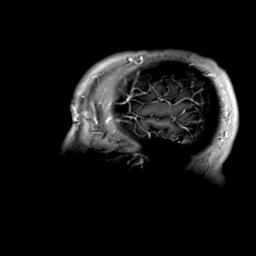
[im 30/30]
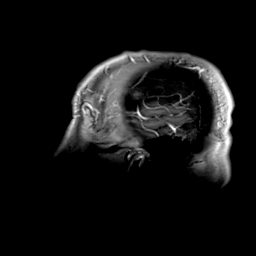

[Series 18: T1 · coronal · 4.0mm · 0.94mm/px · 2 of 30 slices shown (4 of 4)]
[im 1/30]
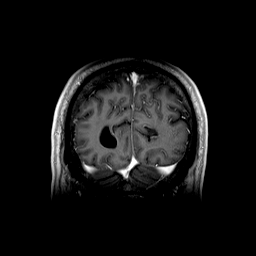
[im 30/30]
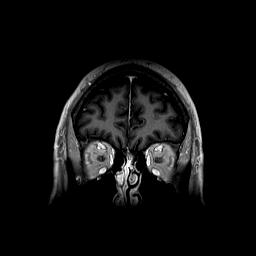

[Series 19: T1 post-contrast · coronal · 5.0mm · 0.43mm/px · 1 of 26 slices shown (2 of 3)]
[im 1/26]
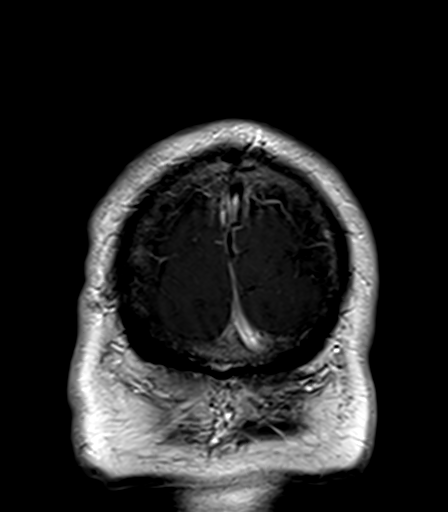

[Series 20: T1 post-contrast · sagittal · 5.0mm · 0.75mm/px · 1 of 24 slices shown (3 of 3)]
[im 1/24]
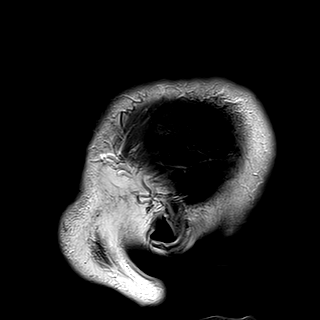

[48 of 48 positions shown; findings below may reference images not displayed]

FINDINGS: Brain: Recurring T2 hyperintensity and swelling in the left ventral
brain at an beneath the left stratum and globus pallidus, with
patchy indistinct enhancement also increasing in this area. The
degree abnormality is not as extensive as seen at initial
presentation. No new site of involvement. Visible biopsy tract the
left frontal lobe.

Negative for infarct, hydrocephalus, collection, or atrophy

Vascular: Preserved flow voids and vascular enhancement.

Skull and upper cervical spine: Normal marrow signal

Sinuses/Orbits: Negative
IMPRESSION: Recurring disease in and about the left basal ganglia, although not
as extensive as seen at presentation. No interval evolution or
specific finding to clarify the diagnosis.

## 2020-10-08 MED ORDER — GADOBUTROL 1 MMOL/ML IV SOLN
10.0000 mL | Freq: Once | INTRAVENOUS | Status: AC | PRN
  Administered 2020-10-08: 10 mL via INTRAVENOUS

## 2020-10-08 NOTE — Telephone Encounter (Signed)
Patient has a history of paroxysmal atrial fibrillation.  He has been on Xarelto.  He has a known history of allergy to contrast.  He had a contrast CT scan today and several hours later apparently went into atrial fibrillation.  He presented to his primary medical doctor's office to verify that he is in atrial fibrillation at a rate of 110.  He is still on Xarelto.  I have advised Dr. Jacelyn Grip  to give the patient Cardizem 30 mg tablets to take every 6 hours.  I think there is a high likelihood that he will go back into sinus rhythm since that this appears to be caused by the IV contrast.  He will call the office on Monday for further advice.     Mertie Moores, MD  10/08/2020 4:11 PM    [facility] [place],  [place], [place]  [address] Phone: [phone]; Fax: [phone]

## 2020-10-08 NOTE — Telephone Encounter (Signed)
Dr. Acie Fredrickson returned call to Dr. Jacelyn Grip.

## 2020-10-08 NOTE — Telephone Encounter (Signed)
See phone note

## 2020-10-08 NOTE — Telephone Encounter (Signed)
Dr. Jacelyn Grip from [facility] calling to speak with DOD. He states the patient is back in afib today. Phone: [phone]

## 2020-10-11 DIAGNOSIS — D496 Neoplasm of unspecified behavior of brain: Secondary | ICD-10-CM | POA: Diagnosis not present

## 2020-10-11 DIAGNOSIS — R001 Bradycardia, unspecified: Secondary | ICD-10-CM | POA: Diagnosis not present

## 2020-10-11 DIAGNOSIS — J3089 Other allergic rhinitis: Secondary | ICD-10-CM | POA: Diagnosis not present

## 2020-10-11 DIAGNOSIS — R29898 Other symptoms and signs involving the musculoskeletal system: Secondary | ICD-10-CM | POA: Diagnosis not present

## 2020-10-11 DIAGNOSIS — I4891 Unspecified atrial fibrillation: Secondary | ICD-10-CM | POA: Diagnosis not present

## 2020-10-11 DIAGNOSIS — J301 Allergic rhinitis due to pollen: Secondary | ICD-10-CM | POA: Diagnosis not present

## 2020-10-11 NOTE — Telephone Encounter (Signed)
Pt called, have received my MRI results on  MyChart. Would like a call from the nurse.

## 2020-10-11 NOTE — Telephone Encounter (Signed)
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 No interval evolution or specific finding to clarify the diagnosis.   Electronically Signed   By: Monte Fantasia M.D.   On: 10/08/2020 10:27 I have talked with patient and his wife, repeat MRI of the brain without contrast October 08, 2020 which showed multifocal disease Infarct left basal ganglion, not as extensive as the initial presentation compared to October 2021, but much worse compared to recent MRI scan in February 2022  Patient complains of dizziness, fatigue taking Keppra 500 mg twice a day, did help initially, and then I will stop weakness episode  He had atrial fibrillation, heart rate went up to 118 following his recent MRI of the brain with without contrast, Keppra dosage was decreased to 50 mg twice a day  He was called by [facility] today, Oct 11 2020, they will review MRI brain and call patient back for care plan,

## 2020-10-12 ENCOUNTER — Telehealth: Payer: Self-pay | Admitting: Neurology

## 2020-10-12 NOTE — Telephone Encounter (Signed)
Pt's wife called stating that they were informed that they would be seen tomorrow. She would like RN to call her back.

## 2020-10-12 NOTE — Telephone Encounter (Signed)
I spoke to the patient's wife who said Dr. Krista Blue was willing to work her husband in on Wednesday. He has been added to her schedule.

## 2020-10-13 ENCOUNTER — Encounter: Payer: Self-pay | Admitting: Neurology

## 2020-10-13 ENCOUNTER — Ambulatory Visit (INDEPENDENT_AMBULATORY_CARE_PROVIDER_SITE_OTHER): Payer: Medicare Other | Admitting: Neurology

## 2020-10-13 VITALS — BP 131/70 | HR 62 | Wt 213.0 lb

## 2020-10-13 DIAGNOSIS — R799 Abnormal finding of blood chemistry, unspecified: Secondary | ICD-10-CM | POA: Insufficient documentation

## 2020-10-13 DIAGNOSIS — W19XXXA Unspecified fall, initial encounter: Secondary | ICD-10-CM | POA: Insufficient documentation

## 2020-10-13 DIAGNOSIS — I2510 Atherosclerotic heart disease of native coronary artery without angina pectoris: Secondary | ICD-10-CM | POA: Diagnosis not present

## 2020-10-13 DIAGNOSIS — R569 Unspecified convulsions: Secondary | ICD-10-CM | POA: Diagnosis not present

## 2020-10-13 DIAGNOSIS — W19XXXS Unspecified fall, sequela: Secondary | ICD-10-CM

## 2020-10-13 DIAGNOSIS — I69351 Hemiplegia and hemiparesis following cerebral infarction affecting right dominant side: Secondary | ICD-10-CM | POA: Diagnosis not present

## 2020-10-13 DIAGNOSIS — D8489 Other immunodeficiencies: Secondary | ICD-10-CM | POA: Diagnosis not present

## 2020-10-13 DIAGNOSIS — E079 Disorder of thyroid, unspecified: Secondary | ICD-10-CM | POA: Diagnosis not present

## 2020-10-13 DIAGNOSIS — D72810 Lymphocytopenia: Secondary | ICD-10-CM | POA: Diagnosis not present

## 2020-10-13 DIAGNOSIS — R9089 Other abnormal findings on diagnostic imaging of central nervous system: Secondary | ICD-10-CM | POA: Diagnosis not present

## 2020-10-13 DIAGNOSIS — G939 Disorder of brain, unspecified: Secondary | ICD-10-CM | POA: Diagnosis not present

## 2020-10-13 MED ORDER — LAMOTRIGINE 100 MG PO TABS: 100.0000 mg | ORAL_TABLET | Freq: Two times a day (BID) | ORAL | 11 refills | Status: DC

## 2020-10-13 MED ORDER — LAMOTRIGINE 25 MG PO TABS: ORAL_TABLET | ORAL | 0 refills | Status: DC

## 2020-10-13 NOTE — Progress Notes (Signed)
Chief Complaint  Patient presents with  . Follow-up    He is here with his wife, Cory Mathis. They would like to review his MRI brain and discuss is overall health. He did not take Keppra last night or this morning. He wants to talk about this medicaiton.      ASSESSMENT AND PLAN  Cory Mathis is a 70 y.o. male   Left hemisphere basal ganglia and adjacent white matter T2 hyperintensity  Brain biopsy showed hypercellular brain tissue, with no inflammatory infiltration, the inflammatory cells including CD3 positive T cells, CD20 positive B cells, and CD68 positive macrophages, there is no evidence of vasculitis of granulomatous inflammation, organisms are not identified, LDH high, R132H, toxoplasmosis, SV40 and HSV immunohistochemical stains are negative, the histology does not suggest a glial neuroblastoma,  Patient has temporary improvement clinical and imaging wise following steroid treatment, but most recent repeat MRI of the brain with without contrast on October 08, 2020, after stopping steroid, showed recurrent disease in and about left basal ganglion, does correlate with his reported worsening intermittent right-sided weakness,    Cannot rule out the possibility of primary central nervous system neoplasma, differentiation diagnoses also include inflammatory, infectious, demyelinating, histology does not suggest a glial neoplasm.  After extensive discussion with patient and his wife, they would like to be referred to Dr. Elmer Mathis at [facility] neurology  Reported episode of sudden onset of right leg weakness,  Possible partial seizure  EEG,  Keppra 500 mg twice daily reported irritation, has already stopped the [place] himself  Advised patient to document event while he is not on antiepileptic medications, also provide prescription of titrating dose of lamotrigine, suggest him to start lamotrigine titrating to 100 mg twice a day,   DIAGNOSTIC DATA (LABS, IMAGING, TESTING) - I reviewed  patient records, labs, notes, testing and imaging myself where available.  The biopsy shows hypercellular brain tissue with a brisk perivascular  and parenchymal inflammatory infiltrate. The inflammatory cells include  CD3 positive T cells, CD20 positive B cells and CD68 positive  macrophages. There is no evidence of vasculitis or granulomatous  inflammation, and organisms are not identified. IDH1 R132H,  toxoplasmosis, SV40 and HSV immunohistochemical stains are negative.  The differential diagnosis may include inflammatory or infectious  etiologies, demyelinating disease and others. The histology does not  suggest a glial neoplasm, although due to clinical concern for  glioblastoma we are performing EGFR and TERT studies to further  evaluate. If the lesion progresses or if there is concern for  potentially undersampled lesion consider repeat biopsy if clinically    INTERPRETATION SUMMARY: EGFR does not exhibit amplification. The number of cells with a gain is less than 15%.   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 No interval evolution or specific finding to clarify the diagnosis.  HISTORICAL Cory Mathis is a 70 year old male, seen in refer by primary care doctor Cory Mathis, for evaluation short-term memory loss, initial evaluation was on July 25, 2017.  I reviewed and summarized the referring note, he has past history of anxiety, hearing loss, goiter status post resection, hyperlipidemia, hypertension, he is a retired Financial trader from [employer], moved to [place] in April 2015.  He also reported a history of fever of unknown etiology, had 2 episodes 20 years apart, most recent episode was in 2014, fever up to 103, lasting for couple weeks,  without clear etiology found, he was seen by different specialist, including rheumatologist, ID  specialist, symptoms eventually improved by steroid tapering dose, treatment lasted for a few months.  Since 2015, he noticed mild memory loss, he can still keep very complicated spreadsheets without difficulty driving without loss, but in his daily activities, he let the water running for hours, sometimes forget why he goes to upstairs,  He also complains of chronic insomnia, gradually getting worse, was seen by sleep specialist Cory Mathis, was diagnosis of obstructive sleep apnea, using CPAP machine, also taking titrating dose of Ambien 12.5 mg every night, but sometimes it does not work.  UPDATE September 17 2017: He got fever again at the end of February, had extensive laboratory evaluation by his primary care doctor [facility], was also seen by infectious disease Cory Mathis, no etiology was found, he was treated with prednisone, whenever he taper off the prednisone, his fever come back at 4 PM, now is on 25 mg daily, continue complains of difficulty sleeping at nighttime, taking Ambien 12.5 mg daily We have personally reviewed MRI of the brain without contrast in February 2019 that was normal  Laboratory evaluation showed normal CBC CMP, TSH, fasting lipid profile, LDL was 86  Update September 28, 2020 He lost to follow-up since April 2019, on October 2021, he complains of increased headache, mental confusion, right arm weakness, CT head and later MRI of the brain with without contrast March 30, 2020 showed 4.5 cm infiltrating mass centered at the base of the brain on the left with involvement of the basal ganglion, nonradiating white matter tracts, mass-effect upon left lateral ventricle, no midline shift, which raised the findings most worrisome for glioblastoma  He underwent left stereotactic brain biopsy by Cory Mathis on April 06, 2020, pathology reported hypercellular brain tissue with inflammatory infiltration,   The biopsy shows hypercellular brain tissue with a brisk perivascular   and parenchymal inflammatory infiltrate. The inflammatory cells include  CD3 positive T cells, CD20 positive B cells and CD68 positive  macrophages. There is no evidence of vasculitis or granulomatous  inflammation, and organisms are not identified. IDH1 R132H,  toxoplasmosis, SV40 and HSV immunohistochemical stains are negative.  The differential diagnosis may include inflammatory or infectious  etiologies, demyelinating disease and others. The histology does not  suggest a glial neoplasm, although due to clinical concern for  glioblastoma we are performing EGFR and TERT studies to further  evaluate. If the lesion progresses or if there is concern for  potentially undersampled lesion consider repeat biopsy if clinically  indicated EGFR does not exhibit amplification, the number of cells with again is less than 15%,  He was managed by Dr. 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  Patient complete Decadron 2 mg from November 18 to May 09, 2020, steroid  free for few days, then developed a rash on his face, and neck, did not improve with hydrocortisone cream, on May 17, 2020, he was  given hydrocortisone 20 mg in the morning, 30 tablets,  He presented to emergency room on June 19, 2020 for balance issues, lightheadedness, fall, worsening headache, he did not get second brain biopsy because significant improvement with steroid treatment continue MRI surveillance  Since February 2022, he also began to experience episode of sudden onset loss control of his leg, fell to the ground, less than 1 minute, Dr. Mickeal Skinner started Keppra 500 mg twice daily, patient only take it for 3 days, complains of dizziness, increased leg weakness, Keppra was stopped, he was given another trial of dexamethasone on July 19, 2020 4 mg daily for 1 week, reduce to 2 mg daily on July 27, 2020  At most recent follow-up with Dr. Mickeal Skinner August 09, 2020, patient and his wife describe ongoing deficit including short-term memory loss, occasional loss of balance, dropped things from his right hand, improved  headache control  MRI of brain with without contrast on June 19, 2020, slight interval increase in T2/flair signal abnormality involving left basal ganglia compared to previous MRI on April 30, 2020,  MRI of the brain with without contrast August 06, 2020, this is following her second round of Decadron treatment, again substantial decrease of T2 hyperdensity in the left basal ganglion and white matter, that correlate with steroid treatment  Extensive laboratory evaluation showed normal or negative  CMP, UDS, elevated WBC 11.7, normal hemoglobin 14.2, negative Lyme titer, C-reactive protein, ANA, TSH, LDL 47  UPDATE Oct 13 2020: Patient and his wife return to clinic to review repeat MRI on October 08, 2020: Which showed recurrent disease in and about the left basal ganglion, although not as extensive as symptom presentation, but certainly showed clear appearing recurrent disease compared to his most recent MRI scan in February, which is his past following his second dose of Decadron  treatment  Patient also reported clinical worsening, he has increased episode of intermittent right arm and leg weakness, his right leg would give out on him, each episode lasted about few seconds, no loss of consciousness, but wife reported that during the spell he looks spaced out,  He complains of mild irritation with Keppra 500 mg twice daily, also reported increased episode of transient right-sided weakness, he contributed to the side effect of Keppra, has stopped taking medicine,   He was contacted by [facility] Dr. Domingo Cocking, who has reviewed his recent MRI of the brain, suggested having not to follow-up with [place],  After extensive discussion with patient and his wife, he wants to proceed with further evaluation with his ongoing worsening clinical and MRI findings, I have suggested spinal fluid testing, he wants to hold off at this point, would rather go to [facility] Dr. Elias Else for second opinion  REVIEW OF SYSTEMS:  Full 14 system review of systems performed and notable only for as above All other review of systems were negative.  PHYSICAL EXAM:   Vitals:   10/13/20 0916  BP: 131/70  Pulse: 62  Weight: 213 lb (96.6 kg)   Not recorded     Body mass index is 30.56 kg/m.  PHYSICAL EXAMNIATION:  Gen: NAD, conversant, well nourised, well groomed                     Cardiovascular: Regular rate rhythm, no peripheral edema, warm, nontender. Eyes: Conjunctivae  clear without exudates or hemorrhage Neck: Supple, no carotid bruits. Pulmonary: Clear to auscultation bilaterally   NEUROLOGICAL EXAM:  MENTAL STATUS: Speech:   Montreal Cognitive Assessment  09/28/2020  Visuospatial/ Executive (0/5) 5  Naming (0/3) 3  Attention: Read list of digits (0/2) 2  Attention: Read list of letters (0/1) 1  Attention: Serial 7 subtraction starting at 100 (0/3) 3  Language: Repeat phrase (0/2) 2  Language : Fluency (0/1) 0  Abstraction (0/2) 2  Delayed  Recall (0/5) 3  Orientation (0/6) 6  Total 27  Adjusted Score (based on education) 27     CRANIAL NERVES: CN II: Visual fields are full to confrontation. Pupils are round equal and briskly reactive to light. CN III, IV, VI: extraocular movement are normal. No ptosis. CN V: Facial sensation is intact to light touch CN VII: Face is symmetric with normal eye closure  CN VIII: Hearing is normal to causal conversation. CN IX, X: Phonation is normal. CN XI: Head turning and shoulder shrug are intact  MOTOR: Fixation of right arm upon rapid rotating movement, mild right upper extremity proximal muscle weakness  REFLEXES: Hyperreflexia of right upper and lower extremity  SENSORY: Intact to light touch, pinprick and vibratory sensation are intact in fingers and toes.  COORDINATION: There is no trunk or limb dysmetria noted.  GAIT/STANCE: He can get up from seated position arm crossed, decreased right arm swing, dragging right leg slightly Romberg is absent.  ALLERGIES: Allergies  Allergen Reactions  . Other     Mints - makes him sneeze  . Sulfa Antibiotics Hives and Rash  . Sulfasalazine Hives and Other (See Comments)    other  . Metronidazole Other (See Comments)    Pt reported he was light headed for 2 weeks.      HOME MEDICATIONS: Current Outpatient Medications  Medication Sig Dispense Refill  . acetaminophen (TYLENOL) 500 MG tablet Take 1,000 mg by mouth 2 (two) times daily as needed for headache.    Marland Kitchen amLODipine (NORVASC) 5 MG tablet TAKE 1 TABLET(5 MG) BY MOUTH TWICE DAILY (Patient taking differently: Take 5 mg by mouth daily.) 180 tablet 3  . carboxymethylcellulose (REFRESH PLUS) 0.5 % SOLN Place 1 drop into both eyes 4 (four) times daily as needed (dry eyes).    Marland Kitchen diltiazem (CARDIZEM) 30 MG tablet Take 30 mg by mouth in the morning and at bedtime.    . eszopiclone (LUNESTA) 2 MG TABS tablet Take 2 mg by mouth at bedtime.    . levETIRAcetam (KEPPRA) 500 MG tablet  Take 1 tablet (500 mg total) by mouth 2 (two) times daily. 60 tablet 11  . rosuvastatin (CRESTOR) 5 MG tablet TAKE 1/2 TABLET(2.5 MG) BY MOUTH DAILY 45 tablet 2  . XARELTO 20 MG TABS tablet TAKE 1 TABLET(20 MG) BY MOUTH DAILY WITH SUPPER (Patient taking differently: Take 20 mg by mouth daily with supper.) 30 tablet 6   No current facility-administered medications for this visit.    PAST MEDICAL HISTORY: Past Medical History:  Diagnosis Date  . BPH (benign prostatic hyperplasia)   . Diverticulosis of colon   . Dysrhythmia 2019   Afib  . GERD (gastroesophageal reflux disease)    prn med. control  . Headache   . History of adenomatous polyp of colon    03-29-2007  tubular adenoma/  08-07-2016  hyperplastic and tubular adenoma's  . History of alcoholic gastritis    [id number]  gastritis, esophagitis, peptic duodenitis  . History of colonic diverticulitis  01-31-2016  resolved w/ medications  . History of Helicobacter pylori infection    10-30-2008  . 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 Hypertension   . Internal hemorrhoids   . Memory loss   . OSA on CPAP    CPAP nightly  . Right thyroid nodule   . Urinary retention 12/13/2016    PAST SURGICAL HISTORY: Past Surgical History:  Procedure Laterality Date  . APPLICATION OF CRANIAL NAVIGATION N/A 04/06/2020   Procedure: APPLICATION OF CRANIAL NAVIGATION;  Surgeon: Judith Part, MD;  Location: [facility];  Service: Neurosurgery;  Laterality: N/A;  . BRAIN SURGERY    . CARDIOVERSION N/A 12/23/2018   Procedure: CARDIOVERSION;  Surgeon: Buford Dresser, MD;  Location: [facility] ENDOSCOPY;  Service: Cardiovascular;  Laterality: N/A;  . COLONOSCOPY  last one 08-07-2016  . EYE SURGERY    . FRAMELESS  BIOPSY WITH BRAINLAB Left 04/06/2020   Procedure: Left Stereotactic brain biopsy with brainlab;  Surgeon: Judith Part, MD;  Location: [facility];  Service: Neurosurgery;   Laterality: Left;  . HEMORRHOID BANDING  10-20-2016;  09-11-2016  . HERNIA REPAIR    . KNEE ARTHROSCOPY Bilateral    10 + yrs ago  . LASIK Bilateral   . PILONIDAL CYST EXCISION    . THYROIDECTOMY Left 2013  . TRANSURETHRAL RESECTION OF PROSTATE N/A 01/04/2017   Procedure: TRANSURETHRAL RESECTION OF THE PROSTATE (TURP);  Surgeon: Franchot Gallo, MD;  Location: [facility];  Service: Urology;  Laterality: N/A;  . UPPER GASTROINTESTINAL ENDOSCOPY  10/30/2008  . WISDOM TOOTH EXTRACTION      FAMILY HISTORY: Family History  Problem Relation Age of Onset  . Heart attack Mother        62's  . Other Father        atherosclerosis - 23's  . Prostate cancer Maternal Grandfather   . Other Sister        brain tumor - unsure if it was cancer - 64's  . Colon cancer Neg Hx   . Esophageal cancer Neg Hx   . Pancreatic cancer Neg Hx   . Rectal cancer Neg Hx   . Stomach cancer Neg Hx     SOCIAL HISTORY: Social History   Socioeconomic History  . Marital status: Married    Spouse name: Not on file  . Number of children: 0  . Years of education: 16+  . Highest education level: Bachelor's degree (e.g., BA, AB, BS)  Occupational History  . Occupation: Retired  Tobacco Use  . Smoking status: Former Research scientist (life sciences)  . Smokeless tobacco: Never Used  Vaping Use  . Vaping Use: Never used  Substance and Sexual Activity  . Alcohol use: Yes    Comment: social   . Drug use: No  . Sexual activity: Not on file  Other Topics Concern  . Not on file  Social History Narrative   Left-handed.   1.5 cups caffeine per day.   Lives at home with his wife.   Social Determinants of Health   Financial Resource Strain: Not on file  Food Insecurity: Not on file  Transportation Needs: Not on file  Physical Activity: Not on file  Stress: Not on file  Social Connections: Not on file  Intimate Partner Violence: Not on file    Total time spent reviewing the chart, obtaining history, examined  patient, ordering tests, documentation, consultations and family, care coordination was 45 minutes  Marcial Pacas, M.D. Ph.D.  [facility] [address] [place], [place] [address] Ph: [phone] Fax: [phone]  CC:  Vernie Shanks, MD [place],  [place] [address]  Vernie Shanks, MD

## 2020-10-14 DIAGNOSIS — G8191 Hemiplegia, unspecified affecting right dominant side: Secondary | ICD-10-CM | POA: Diagnosis not present

## 2020-10-14 DIAGNOSIS — D496 Neoplasm of unspecified behavior of brain: Secondary | ICD-10-CM | POA: Diagnosis not present

## 2020-10-14 DIAGNOSIS — J301 Allergic rhinitis due to pollen: Secondary | ICD-10-CM | POA: Diagnosis not present

## 2020-10-14 DIAGNOSIS — R413 Other amnesia: Secondary | ICD-10-CM | POA: Diagnosis not present

## 2020-10-14 DIAGNOSIS — J3089 Other allergic rhinitis: Secondary | ICD-10-CM | POA: Diagnosis not present

## 2020-10-14 LAB — T-HELPER CELLS CD4/CD8 %
% CD 4 Pos. Lymph.: 37.8 % (ref 30.8–58.5)
Absolute CD 4 Helper: 794 /uL (ref 359–1519)
Basophils Absolute: 0 10*3/uL (ref 0.0–0.2)
Basos: 1 %
CD3+CD4+ Cells/CD3+CD8+ Cells Bld: 0.84 — ABNORMAL LOW (ref 0.92–3.72)
CD3+CD8+ Cells # Bld: 945 /uL — ABNORMAL HIGH (ref 109–897)
CD3+CD8+ Cells NFr Bld: 45 % — ABNORMAL HIGH (ref 12.0–35.5)
EOS (ABSOLUTE): 0 10*3/uL (ref 0.0–0.4)
Eos: 1 %
Hematocrit: 43.6 % (ref 37.5–51.0)
Hemoglobin: 14.5 g/dL (ref 13.0–17.7)
Immature Grans (Abs): 0 10*3/uL (ref 0.0–0.1)
Immature Granulocytes: 0 %
Lymphocytes Absolute: 2.1 10*3/uL (ref 0.7–3.1)
Lymphs: 25 %
MCH: 32.3 pg (ref 26.6–33.0)
MCHC: 33.3 g/dL (ref 31.5–35.7)
MCV: 97 fL (ref 79–97)
Monocytes Absolute: 0.7 10*3/uL (ref 0.1–0.9)
Monocytes: 8 %
Neutrophils Absolute: 5.6 10*3/uL (ref 1.4–7.0)
Neutrophils: 65 %
Platelets: 265 10*3/uL (ref 150–450)
RBC: 4.49 x10E6/uL (ref 4.14–5.80)
RDW: 12.2 % (ref 11.6–15.4)
WBC: 8.6 10*3/uL (ref 3.4–10.8)

## 2020-10-14 LAB — HIV ANTIBODY (ROUTINE TESTING W REFLEX): HIV Screen 4th Generation wRfx: NONREACTIVE

## 2020-10-14 LAB — THYROID PANEL WITH TSH
Free Thyroxine Index: 1.6 (ref 1.2–4.9)
T3 Uptake Ratio: 26 % (ref 24–39)
T4, Total: 6.3 ug/dL (ref 4.5–12.0)
TSH: 0.932 u[IU]/mL (ref 0.450–4.500)

## 2020-10-14 LAB — RPR: RPR Ser Ql: NONREACTIVE

## 2020-10-14 NOTE — Progress Notes (Addendum)
Cardiology Office Note:    Date:  10/15/2020   ID:  Cory Mathis, DOB Dec 30, 1950, MRN [id number]  PCP:  Cory Shanks, MD   [facility] Providers Cardiologist:  Cory Carnes, MD     Referring MD: Cory Shanks, MD   Chief Complaint:  Atrial Fibrillation    Patient Profile:    Cory Mathis is a 70 y.o. male with:   Coronary calcification  Paroxysmal atrial fibrillation  S/p DCCV in 2020  Hypertension   Hyperlipidemia   GERD  OSA  Anxiety  Goiter s/p resection  BPH  Brain lesion  Ongoing evaluation with Neuro  Prior CV studies: Echocardiogram 12/20/2018 EF 60-65, normal RVSF, trivial AI  Holter monitor 01/18/2018 SInus rhythm  36 to 90 bpm   Average HR 51 bpm Longest pause 1.7 sec Rare PAC  Short burst PAT   Rare PVC  CT cardiac scoring 04/19/2017 IMPRESSION: 1. LAD and RCA coronary calcifications  2. Total Agatston Score: 186  3. MESA database percentile:  66 st  4. Small LEFT upper lobe pulmonary nodule favored benign. No follow-up needed if patient is low-risk. Non-contrast chest CT can be considered in 12 months if patient is high-risk. This recommendation follows the consensus statement: Guidelines for Management of Incidental Pulmonary Nodules Detected on CT Images: From the Fleischner Society 2017; Radiology 2017; 284:228-243.   History of Present Illness: Cory Mathis was last seen by Cory Mathis in 9/21.  Notes in his chart indicate his PCP spoke with Cory Mathis in this office 10/08/20.  Notes indicate the pt went into AFib after an allergic reaction to IV contrast during a CT scan.  Recommendation was to take Diltiazem 30 mg every 12 hours.  He returns for f/u.   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  He was treated with steroids.  Follow-up scans demonstrated substantial regression.   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  He was last seen by neurology 10/13/2020 and is being referred to Cory Mathis.  He returns for follow-up.  He is here alone.  He really did not have any symptoms with atrial fibrillation.  He notes that his heart rate was in the 1 teens.  I do not have an electrocardiogram from that day.  He has not had any chest pain, shortness of breath, orthopnea, leg edema or syncope.    Past Medical History:  Diagnosis Date  . Allergic rhinitis   . Atrial fibrillation with rapid ventricular response (Cory Mathis)   . Benign neoplasm of right choroid   . BPH (benign prostatic hyperplasia)   . Brain tumor (benign) (Cory Mathis)   . Chest pain   . Chronic headaches   . Constipation   . Diverticulosis of colon   . Dysplastic nevus   . Dysrhythmia 2019   Afib  . Fatigue   . Gastroesophageal reflux disease without esophagitis   . GERD (gastroesophageal reflux disease)    prn med. control  . H/O thyroidectomy   . Headache   . History of adenomatous polyp of colon    03-29-2007  tubular adenoma/  08-07-2016  hyperplastic and tubular adenoma's  . History of alcoholic gastritis    [id number]  gastritis, esophagitis, peptic duodenitis  . History of colonic diverticulitis    01-31-2016  resolved w/ medications  .  History of Helicobacter pylori infection    10-30-2008  . 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 Hypertension   . Hypothyroidism   . Insomnia   . Internal hemorrhoids   . Left leg weakness   . Memory loss   . Nocturia   . OSA on CPAP    CPAP nightly  . Paresthesia of both feet   . Retinal pigmentation   . 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Cory Mathis)   . Right thyroid nodule   . Sinus bradycardia   . Urinary retention 12/13/2016    Current Medications: Current Meds  Medication Sig  . acetaminophen (TYLENOL) 500 MG tablet Take 1,000 mg by  mouth 2 (two) times daily as needed for headache.  Marland Kitchen amLODipine (NORVASC) 5 MG tablet Take 5 mg by mouth daily.  . carboxymethylcellulose (REFRESH PLUS) 0.5 % SOLN Place 1 drop into both eyes 4 (four) times daily as needed (dry eyes).  Marland Kitchen EPINEPHrine 0.3 mg/0.3 mL IJ SOAJ injection See admin instructions.  . eszopiclone (LUNESTA) 2 MG TABS tablet Take 2 mg by mouth at bedtime.  . lamoTRIgine (LAMICTAL) 100 MG tablet Take 1 tablet (100 mg total) by mouth 2 (two) times daily.  Marland Kitchen lamoTRIgine (LAMICTAL) 25 MG tablet 1 tab bid x one week 2 tab bid x 2nd week 3 tab bid x 3rd week  . rosuvastatin (CRESTOR) 5 MG tablet TAKE 1/2 TABLET(2.5 MG) BY MOUTH DAILY  . XARELTO 20 MG TABS tablet TAKE 1 TABLET(20 MG) BY MOUTH DAILY WITH SUPPER  . [DISCONTINUED] diltiazem (CARDIZEM) 30 MG tablet Take 30 mg by mouth in the morning and at bedtime.     Allergies:   Other, Sulfa antibiotics, Sulfasalazine, and Metronidazole   Social History   Tobacco Use  . Smoking status: Former Research scientist (life sciences)  . Smokeless tobacco: Never Used  Vaping Use  . Vaping Use: Never used  Substance Use Topics  . Alcohol use: Yes    Comment: social   . Drug use: No     Family Hx: The patient's family history includes Heart attack in his mother; Other in his father and sister; Prostate cancer in his maternal grandfather. There is no history of Colon cancer, Esophageal cancer, Pancreatic cancer, Rectal cancer, or Stomach cancer.  Review of Systems  Respiratory: Negative for hemoptysis.   Gastrointestinal: Negative for hematochezia and melena.  Genitourinary: Negative for hematuria.     EKGs/Labs/Other Test Reviewed:    EKG:  EKG is   ordered today.  The ekg ordered today demonstrates sinus bradycardia, HR 59, normal axis, right bundle branch block, QTC 437, no change from prior tracing  Recent Labs: 06/19/2020: ALT 12; BUN 11; Creatinine, Ser 0.80; Potassium 4.2; Sodium 136 10/13/2020: Hemoglobin 14.5; Platelets 265; TSH 0.932    Recent Lipid Panel Lab Results  Component Value Date/Time   CHOL 106 08/22/2019 02:32 PM   TRIG 118 08/22/2019 02:32 PM   HDL 38 (L) 08/22/2019 02:32 PM   CHOLHDL 2.8 08/22/2019 02:32 PM   LDLCALC 47 08/22/2019 02:32 PM   Labs obtained via fax from PCP - personally reviewed and interpreted: 10/09/2020: K+ 4.3, creatinine 0.92 08/26/2020: TSH 0.95, Hgb 14.0, ALT 12  Risk Assessment/Calculations:    CHA2DS2-VASc Score = 3  This indicates a 3.2% annual risk of stroke. The patient's score is based upon: CHF History: No HTN History: Yes Diabetes History: No Stroke History: No Vascular Disease History: Yes Age Score: 1 Gender Score: 0  Physical Exam:    VS:  BP 136/70   Pulse (!) 59   Ht 5\' 10"  (1.778 m)   Wt 210 lb (95.3 kg)   SpO2 96%   BMI 30.13 kg/m     Wt Readings from Last 3 Encounters:  10/15/20 210 lb (95.3 kg)  10/13/20 213 lb (96.6 kg)  08/09/20 212 lb 4.8 oz (96.3 kg)     Constitutional:      Appearance: Healthy appearance. Not in distress.  Neck:     Vascular: JVD normal.  Pulmonary:     Effort: Pulmonary effort is normal.     Breath sounds: No wheezing. No rales.  Cardiovascular:     Normal rate. Regular rhythm. Normal S1. Normal S2.     Murmurs: There is no murmur.  Edema:    Peripheral edema absent.  Abdominal:     Palpations: Abdomen is soft. There is no hepatomegaly.  Skin:    General: Skin is warm and dry.  Neurological:     General: No focal deficit present.     Mental Status: Alert and oriented to person, place and time.     Cranial Nerves: Cranial nerves are intact.          ASSESSMENT & PLAN:    1. Paroxysmal atrial fibrillation (HCC) He developed atrial fibrillation after an MRI with contrast.  He had never had a reaction to contrast in the past.  He really did not have any symptoms.  He noted that he was in atrial fibrillation based upon his Apple watch.  It lasted about 2 to 3 days and he returned to normal sinus rhythm  after that.  He is tolerating anticoagulation.  Recent creatinine, hemoglobin normal.  At this point, I have suggested that he change diltiazem to 30 mg daily as needed for palpitations or recurrent atrial fibrillation.  Continue current dose of rivaroxaban.  Follow-up with Cory Mathis in 6 months.  2. Coronary artery calcification seen on CT scan He is not having anginal symptoms.  He is not on aspirin as he is on Rivaroxaban.  Continue rosuvastatin.  3. Essential hypertension Blood pressure somewhat above target today.  He notes optimal blood pressures at home.  Continue to monitor.  Goal blood pressure <130/80.  As he is on amlodipine, he does not need to be on diltiazem on a daily basis in addition.  As noted above, diltiazem will be changed to as needed.  4. Dyslipidemia Continue statin therapy.  LDL in 3/21 was 47.   Dispo:  Return in about 6 months (around 04/17/2021) for Routine Follow Up, w/ Cory Mathis.   Medication Adjustments/Labs and Tests Ordered: Current medicines are reviewed at length with the patient today.  Concerns regarding medicines are outlined above.  Tests Ordered: Orders Placed This Encounter  Procedures  . EKG 12-Lead   Medication Changes: Meds ordered this encounter  Medications  . diltiazem (CARDIZEM) 30 MG tablet    Sig: Take 1 tablet (30 mg total) by mouth as needed. For a-fib or palpitations.    Dispense:  30 tablet    Refill:  3    Signed, Richardson Dopp, PA-C  10/15/2020 10:02 AM    [facility] [place], [place], [place]  [address] Phone: [phone]; Fax: [phone]

## 2020-10-15 ENCOUNTER — Encounter: Payer: Self-pay | Admitting: Physician Assistant

## 2020-10-15 ENCOUNTER — Encounter: Payer: Self-pay | Admitting: Neurology

## 2020-10-15 ENCOUNTER — Ambulatory Visit (INDEPENDENT_AMBULATORY_CARE_PROVIDER_SITE_OTHER): Payer: Medicare Other | Admitting: Physician Assistant

## 2020-10-15 ENCOUNTER — Other Ambulatory Visit: Payer: Self-pay

## 2020-10-15 VITALS — BP 136/70 | HR 59 | Ht 70.0 in | Wt 210.0 lb

## 2020-10-15 DIAGNOSIS — I10 Essential (primary) hypertension: Secondary | ICD-10-CM

## 2020-10-15 DIAGNOSIS — I480 Paroxysmal atrial fibrillation: Secondary | ICD-10-CM

## 2020-10-15 DIAGNOSIS — E785 Hyperlipidemia, unspecified: Secondary | ICD-10-CM | POA: Diagnosis not present

## 2020-10-15 DIAGNOSIS — I2510 Atherosclerotic heart disease of native coronary artery without angina pectoris: Secondary | ICD-10-CM | POA: Diagnosis not present

## 2020-10-15 MED ORDER — DILTIAZEM HCL 30 MG PO TABS: 30.0000 mg | ORAL_TABLET | ORAL | 3 refills | Status: DC | PRN

## 2020-10-15 NOTE — Patient Instructions (Signed)
Medication Instructions:  Your physician has recommended you make the following change in your medication:   1.  Change Diltiazem  as needed for A-Fib or palpitations.   *If you need a refill on your cardiac medications before your next appointment, please call your pharmacy*   Lab Work: None   If you have labs (blood work) drawn today and your tests are completely normal, you will receive your results only by: Marland Kitchen MyChart Message (if you have MyChart) OR . A paper copy in the mail If you have any lab test that is abnormal or we need to change your treatment, we will call you to review the results.   Testing/Procedures: None   Follow-Up: At [facility], you and your health needs are our priority.  As part of our continuing mission to provide you with exceptional heart care, we have created designated Provider Care Teams.  These Care Teams include your primary Cardiologist (physician) and Advanced Practice Providers (APPs -  Physician Assistants and Nurse Practitioners) who all work together to provide you with the care you need, when you need it.  We recommend signing up for the patient portal called "MyChart".  Sign up information is provided on this After Visit Summary.  MyChart is used to connect with patients for Virtual Visits (Telemedicine).  Patients are able to view lab/test results, encounter notes, upcoming appointments, etc.  Non-urgent messages can be sent to your provider as well.   To learn more about what you can do with MyChart, go to [email or web address].    Your next appointment:   6 month(s)  The format for your next appointment:   In Person  Provider:   Dorris Carnes, MD   Other Instructions Your physician wants you to follow-up in: 6 months with Dr.Ross.  You will receive a reminder letter in the mail two months in advance. If you don't receive a letter, please call our office to schedule the follow-up appointment.

## 2020-10-18 ENCOUNTER — Ambulatory Visit (INDEPENDENT_AMBULATORY_CARE_PROVIDER_SITE_OTHER): Payer: Medicare Other

## 2020-10-18 DIAGNOSIS — R410 Disorientation, unspecified: Secondary | ICD-10-CM

## 2020-10-18 DIAGNOSIS — G939 Disorder of brain, unspecified: Secondary | ICD-10-CM

## 2020-10-18 DIAGNOSIS — I69351 Hemiplegia and hemiparesis following cerebral infarction affecting right dominant side: Secondary | ICD-10-CM

## 2020-10-18 DIAGNOSIS — J301 Allergic rhinitis due to pollen: Secondary | ICD-10-CM | POA: Diagnosis not present

## 2020-10-18 DIAGNOSIS — J3089 Other allergic rhinitis: Secondary | ICD-10-CM | POA: Diagnosis not present

## 2020-10-19 ENCOUNTER — Telehealth: Payer: Self-pay | Admitting: Neurology

## 2020-10-19 DIAGNOSIS — G8191 Hemiplegia, unspecified affecting right dominant side: Secondary | ICD-10-CM | POA: Diagnosis not present

## 2020-10-19 NOTE — Telephone Encounter (Signed)
Faxed referral to [facility] for Dr. Hart Robinsons. Phone: [phone]. Fax: [phone].

## 2020-10-20 DIAGNOSIS — R413 Other amnesia: Secondary | ICD-10-CM | POA: Diagnosis not present

## 2020-10-20 DIAGNOSIS — G8191 Hemiplegia, unspecified affecting right dominant side: Secondary | ICD-10-CM | POA: Diagnosis not present

## 2020-10-20 DIAGNOSIS — D496 Neoplasm of unspecified behavior of brain: Secondary | ICD-10-CM | POA: Diagnosis not present

## 2020-10-21 ENCOUNTER — Telehealth: Payer: Self-pay | Admitting: Neurology

## 2020-10-21 DIAGNOSIS — G939 Disorder of brain, unspecified: Secondary | ICD-10-CM

## 2020-10-21 DIAGNOSIS — I69351 Hemiplegia and hemiparesis following cerebral infarction affecting right dominant side: Secondary | ICD-10-CM

## 2020-10-21 DIAGNOSIS — R9089 Other abnormal findings on diagnostic imaging of central nervous system: Secondary | ICD-10-CM

## 2020-10-21 NOTE — Procedures (Signed)
   HISTORY: 70 year old male, presented with recurrent episode of sudden onset right side weakness, MRI showed left basal ganglion lesion, TECHNIQUE:  This is a routine 16 channel EEG recording with one channel devoted to a limited EKG recording.  It was performed during wakefulness, drowsiness and asleep.  Hyperventilation and photic stimulation were performed as activating procedures.  There are minimum muscle and movement artifact noted.  Upon maximum arousal, posterior dominant waking rhythm consistent of rhythmic alpha range activity,  activities are symmetric over the bilateral posterior derivations and attenuated with eye opening.  Hyperventilation produced mild/moderate buildup with higher amplitude and the slower activities noted.  Photic stimulation did not alter the tracing.  During EEG recording, patient developed drowsiness and no deeper stage of sleep was achieved. During EEG recording, there was no epileptiform discharge noted.  EKG demonstrate sinus rhythm, with heart rate of 60 bpm  CONCLUSION: This is a  normal awake EEG.  There is no electrodiagnostic evidence of epileptiform discharge.  Marcial Pacas, M.D. Ph.D.  [facility] [address], [place] [address] Phone: [phone] Fax:      [phone]

## 2020-10-21 NOTE — Telephone Encounter (Signed)
Pt is asking for a detailed explanation of the results of his EEG, please call

## 2020-10-21 NOTE — Telephone Encounter (Signed)
I called patient for normal EEG.  He still has recurrent episodes of recurrent right side weakness, sometimes with staring spells, highly suspicious for partial seizure.  Normal EEG does not rule out the possibility of seizure, I have encouraged him to try lamotrigine.   Keep appt for December 23 2020.  He was referred to [facility] Dr. Delsa Sale, was not contacted yet

## 2020-10-22 DIAGNOSIS — J301 Allergic rhinitis due to pollen: Secondary | ICD-10-CM | POA: Diagnosis not present

## 2020-10-22 DIAGNOSIS — J3089 Other allergic rhinitis: Secondary | ICD-10-CM | POA: Diagnosis not present

## 2020-10-22 DIAGNOSIS — G8191 Hemiplegia, unspecified affecting right dominant side: Secondary | ICD-10-CM | POA: Diagnosis not present

## 2020-10-25 DIAGNOSIS — J3089 Other allergic rhinitis: Secondary | ICD-10-CM | POA: Diagnosis not present

## 2020-10-25 DIAGNOSIS — J301 Allergic rhinitis due to pollen: Secondary | ICD-10-CM | POA: Diagnosis not present

## 2020-10-28 ENCOUNTER — Other Ambulatory Visit: Payer: Self-pay

## 2020-10-28 ENCOUNTER — Emergency Department (HOSPITAL_COMMUNITY): Payer: Medicare Other

## 2020-10-28 ENCOUNTER — Telehealth: Payer: Self-pay | Admitting: Neurology

## 2020-10-28 ENCOUNTER — Emergency Department (HOSPITAL_COMMUNITY)
Admission: EM | Admit: 2020-10-28 | Discharge: 2020-10-28 | Disposition: A | Discharge status: Home / Self Care | Payer: Medicare Other | Attending: Emergency Medicine | Admitting: Emergency Medicine

## 2020-10-28 ENCOUNTER — Encounter (HOSPITAL_COMMUNITY): Payer: Self-pay

## 2020-10-28 DIAGNOSIS — G8191 Hemiplegia, unspecified affecting right dominant side: Secondary | ICD-10-CM | POA: Diagnosis not present

## 2020-10-28 DIAGNOSIS — Z79899 Other long term (current) drug therapy: Secondary | ICD-10-CM | POA: Diagnosis not present

## 2020-10-28 DIAGNOSIS — R001 Bradycardia, unspecified: Secondary | ICD-10-CM | POA: Diagnosis not present

## 2020-10-28 DIAGNOSIS — G259 Extrapyramidal and movement disorder, unspecified: Secondary | ICD-10-CM

## 2020-10-28 DIAGNOSIS — R413 Other amnesia: Secondary | ICD-10-CM | POA: Diagnosis not present

## 2020-10-28 DIAGNOSIS — E039 Hypothyroidism, unspecified: Secondary | ICD-10-CM | POA: Diagnosis not present

## 2020-10-28 DIAGNOSIS — Z7901 Long term (current) use of anticoagulants: Secondary | ICD-10-CM | POA: Insufficient documentation

## 2020-10-28 DIAGNOSIS — I10 Essential (primary) hypertension: Secondary | ICD-10-CM | POA: Diagnosis not present

## 2020-10-28 DIAGNOSIS — R4182 Altered mental status, unspecified: Secondary | ICD-10-CM | POA: Diagnosis not present

## 2020-10-28 DIAGNOSIS — Z87891 Personal history of nicotine dependence: Secondary | ICD-10-CM | POA: Insufficient documentation

## 2020-10-28 DIAGNOSIS — R531 Weakness: Secondary | ICD-10-CM | POA: Diagnosis not present

## 2020-10-28 DIAGNOSIS — D496 Neoplasm of unspecified behavior of brain: Secondary | ICD-10-CM | POA: Diagnosis not present

## 2020-10-28 DIAGNOSIS — R42 Dizziness and giddiness: Secondary | ICD-10-CM | POA: Diagnosis not present

## 2020-10-28 DIAGNOSIS — G238 Other specified degenerative diseases of basal ganglia: Secondary | ICD-10-CM | POA: Diagnosis not present

## 2020-10-28 LAB — URINALYSIS, ROUTINE W REFLEX MICROSCOPIC
Bilirubin Urine: NEGATIVE
Glucose, UA: NEGATIVE mg/dL
Hgb urine dipstick: NEGATIVE
Ketones, ur: NEGATIVE mg/dL
Leukocytes,Ua: NEGATIVE
Nitrite: NEGATIVE
Protein, ur: NEGATIVE mg/dL
Specific Gravity, Urine: 1.01 (ref 1.005–1.030)
pH: 8 (ref 5.0–8.0)

## 2020-10-28 LAB — CBC WITH DIFFERENTIAL/PLATELET
Abs Immature Granulocytes: 0.03 10*3/uL (ref 0.00–0.07)
Basophils Absolute: 0.1 10*3/uL (ref 0.0–0.1)
Basophils Relative: 1 %
Eosinophils Absolute: 0.1 10*3/uL (ref 0.0–0.5)
Eosinophils Relative: 1 %
HCT: 39.4 % (ref 39.0–52.0)
Hemoglobin: 13.3 g/dL (ref 13.0–17.0)
Immature Granulocytes: 0 %
Lymphocytes Relative: 29 %
Lymphs Abs: 2.7 10*3/uL (ref 0.7–4.0)
MCH: 32.1 pg (ref 26.0–34.0)
MCHC: 33.8 g/dL (ref 30.0–36.0)
MCV: 95.2 fL (ref 80.0–100.0)
Monocytes Absolute: 1 10*3/uL (ref 0.1–1.0)
Monocytes Relative: 10 %
Neutro Abs: 5.6 10*3/uL (ref 1.7–7.7)
Neutrophils Relative %: 59 %
Platelets: 313 10*3/uL (ref 150–400)
RBC: 4.14 MIL/uL — ABNORMAL LOW (ref 4.22–5.81)
RDW: 12 % (ref 11.5–15.5)
WBC: 9.3 10*3/uL (ref 4.0–10.5)
nRBC: 0 % (ref 0.0–0.2)

## 2020-10-28 LAB — COMPREHENSIVE METABOLIC PANEL
ALT: 14 U/L (ref 0–44)
AST: 18 U/L (ref 15–41)
Albumin: 4.1 g/dL (ref 3.5–5.0)
Alkaline Phosphatase: 91 U/L (ref 38–126)
Anion gap: 8 (ref 5–15)
BUN: 12 mg/dL (ref 8–23)
CO2: 23 mmol/L (ref 22–32)
Calcium: 9.4 mg/dL (ref 8.9–10.3)
Chloride: 105 mmol/L (ref 98–111)
Creatinine, Ser: 0.91 mg/dL (ref 0.61–1.24)
GFR, Estimated: >60 mL/min (ref >60)
Glucose, Bld: 87 mg/dL (ref 70–99)
Potassium: 3.7 mmol/L (ref 3.5–5.1)
Sodium: 136 mmol/L (ref 135–145)
Total Bilirubin: 0.8 mg/dL (ref 0.3–1.2)
Total Protein: 7.4 g/dL (ref 6.5–8.1)

## 2020-10-28 IMAGING — CT CT HEAD W/O CM
3 series · 15 of 47 positions shown, 18 images · non-contrast
Comparison: Prior brain MRI examinations [DATE] and earlier.
Head CT [PHONE_NUMBER].

CLINICAL DATA: Mental status change, persistent or worsening.
Additional provided: Possible stroke symptoms, mental status change.

EXAM:
CT HEAD WITHOUT CONTRAST
TECHNIQUE: Contiguous axial images were obtained from the base of the skull
through the vertex without intravenous contrast.

[Series 2: head wo · axial · 0.47mm/px · z∈[-130,+15]mm · 9 of 35 slices shown, 12 images]
[im 3/35  brain]
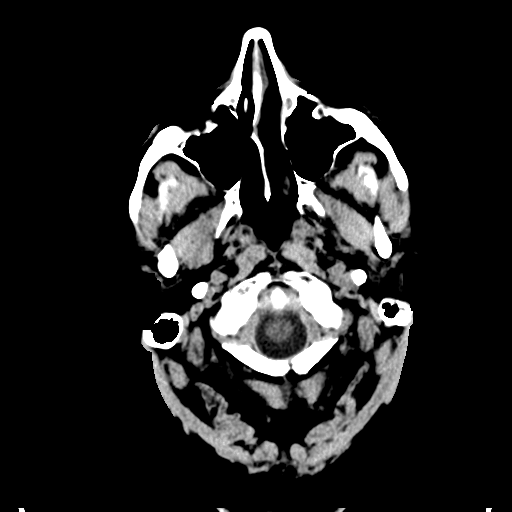
[im 3/35  bone]
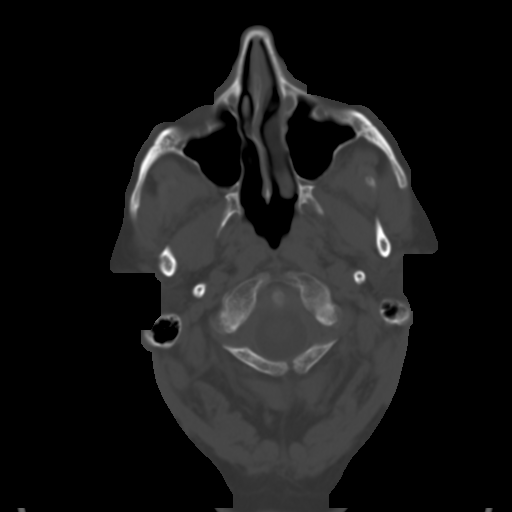
[im 6/35  brain]
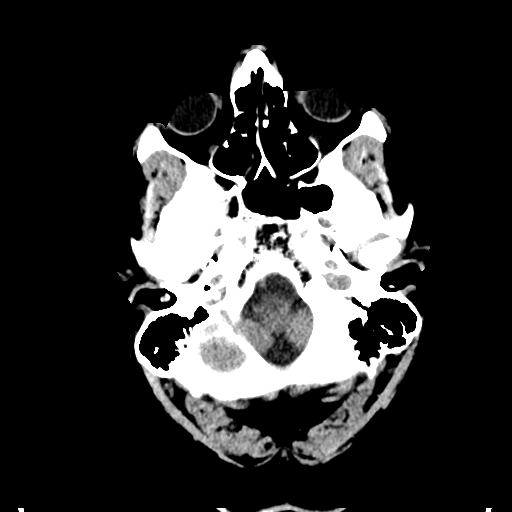
[im 10/35  brain]
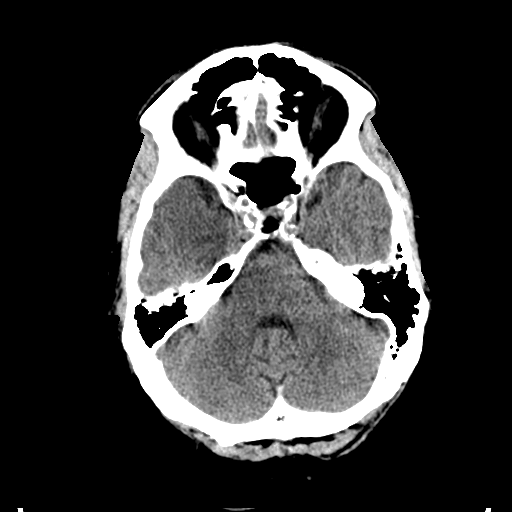
[im 13/35  brain]
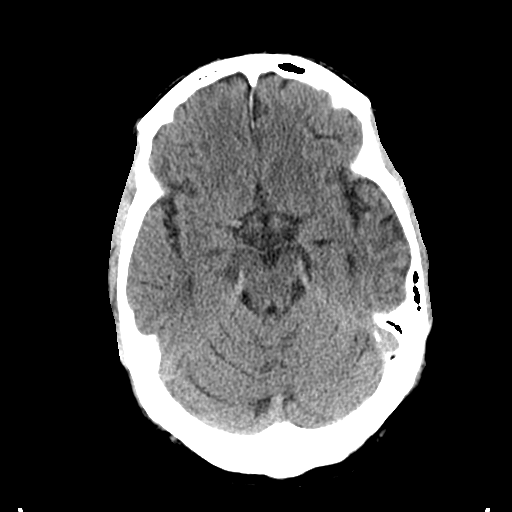
[im 18/35  brain]
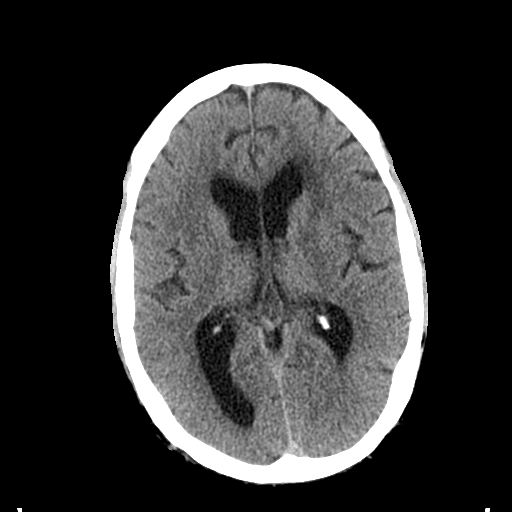
[im 18/35  bone]
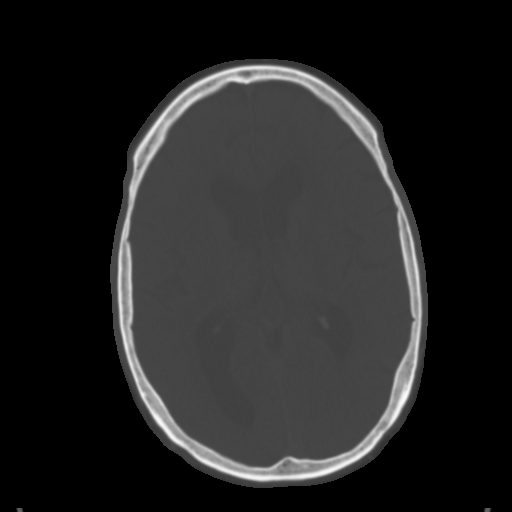
[im 22/35  brain]
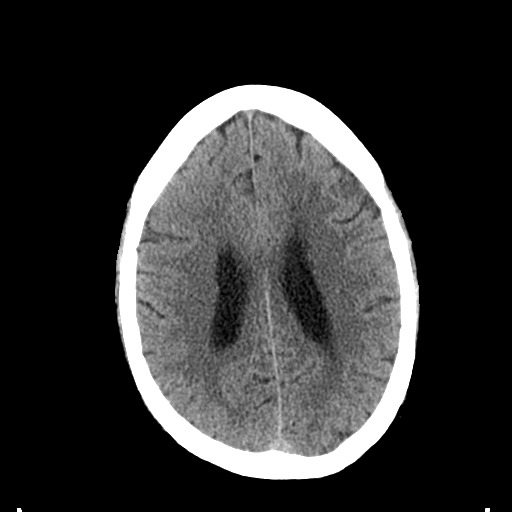
[im 25/35  brain]
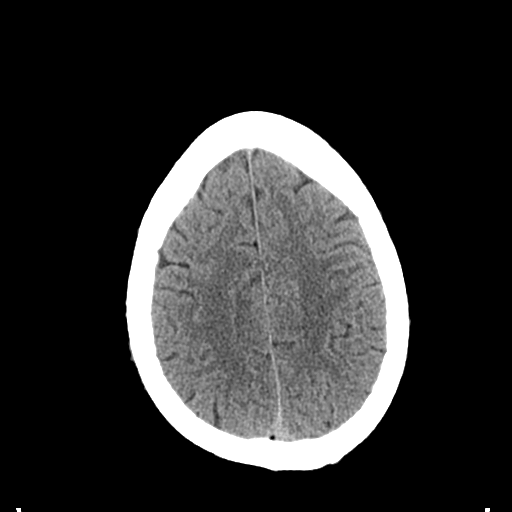
[im 29/35  brain]
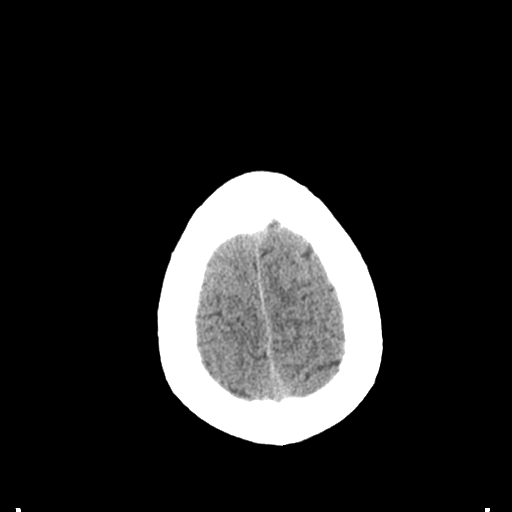
[im 32/35  brain]
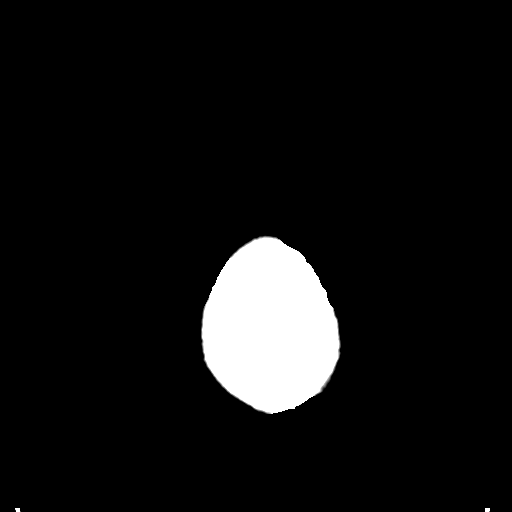
[im 32/35  bone]
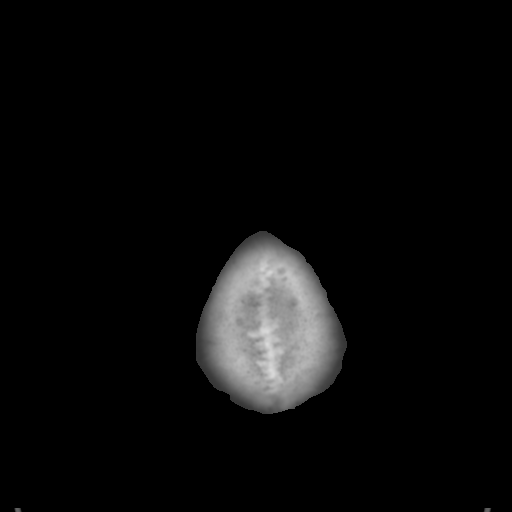

[Series 4: coronal soft tissue · coronal · 0.34mm/px · 3 of 79 slices shown]
[im 27/79  brain]
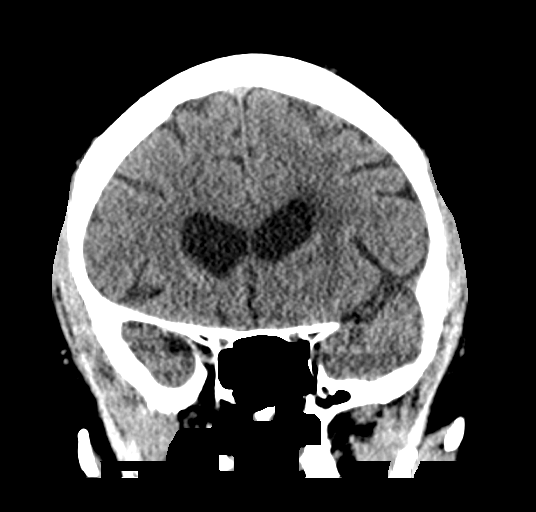
[im 35/79  brain]
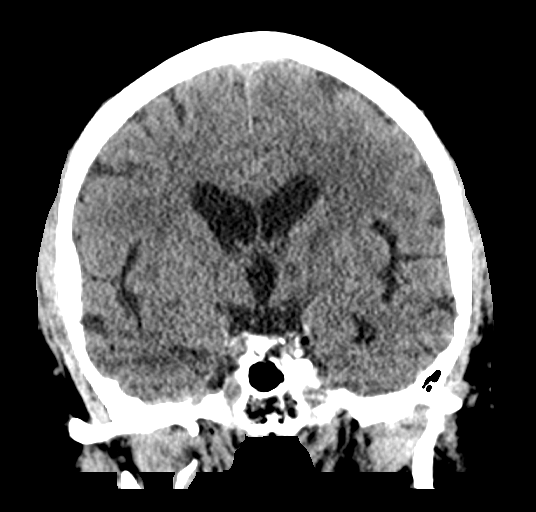
[im 44/79  brain]
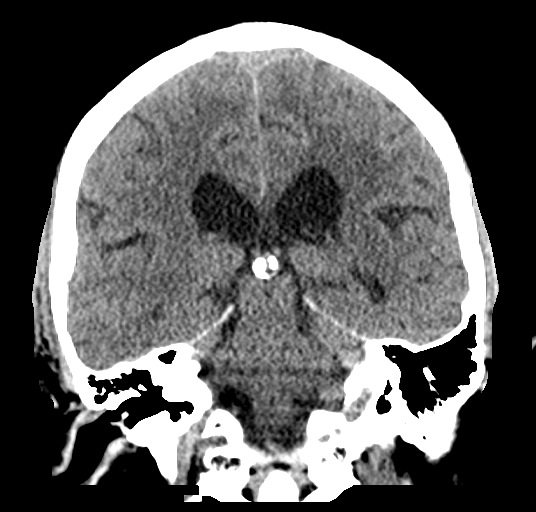

[Series 5: sagittal soft tissue · sagittal · 0.34mm/px · 3 of 64 slices shown]
[im 22/64  brain]
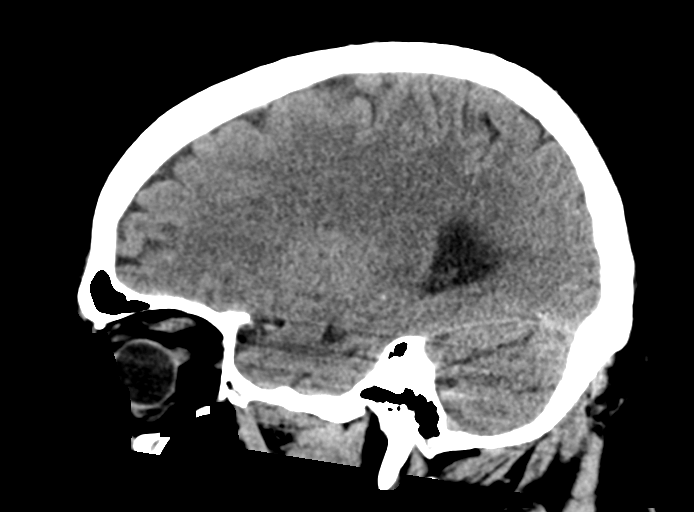
[im 32/64  brain]
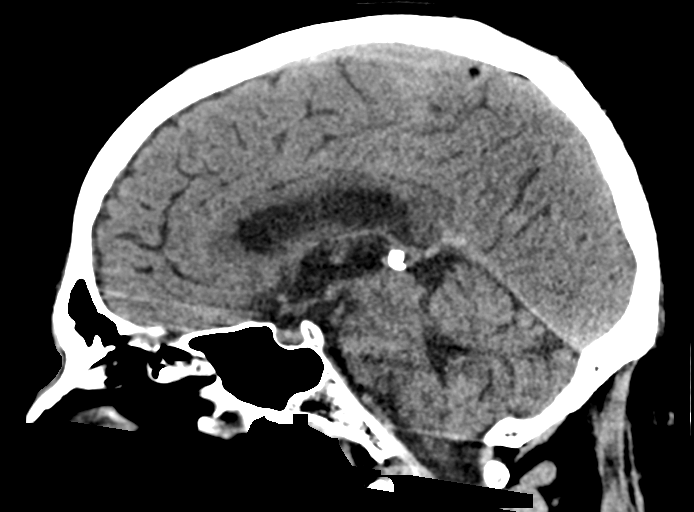
[im 43/64  brain]
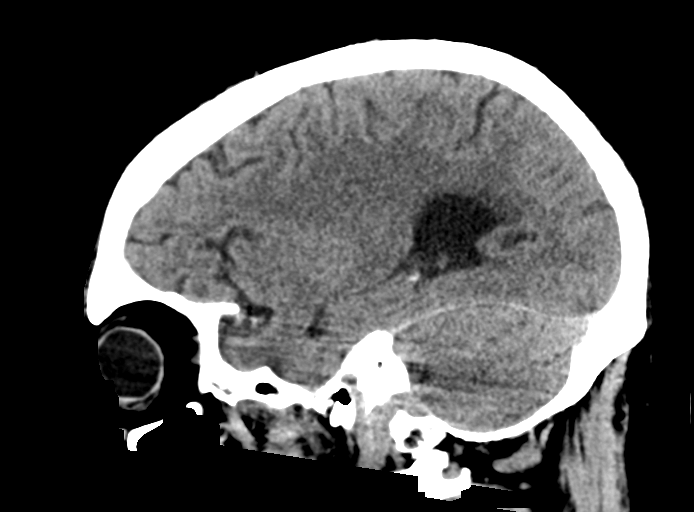

[15 of 47 positions shown; findings below may reference images not displayed]

FINDINGS: Brain:

Stable cerebral volume.

There is abnormal hypodensity within and surrounding the left basal
ganglia, corresponding with the site of signal abnormality and
patchy abnormal enhancement demonstrated on the brain MRI of
[DATE]. Redemonstrated biopsy tract extending toward this region
through the left frontal lobe.

There is no acute intracranial hemorrhage.

No acute demarcated cortical infarct.

No extra-axial fluid collection.

No midline shift.

Vascular: No hyperdense vessel.  Atherosclerotic calcifications.

Skull: Normal. Negative for fracture or focal lesion.

Sinuses/Orbits: Visualized orbits show no acute finding. Mild
bilateral ethmoid sinus mucosal thickening.
IMPRESSION: Abnormal hypodensity within and surrounding the left basal ganglia,
corresponding with the site of signal abnormality and patchy
abnormal enhancement demonstrated on the recent prior brain MRI of
[DATE].

No evidence of interval acute intracranial abnormality.

Redemonstrated biopsy tract traversing the left frontal lobe.

Mild bilateral ethmoid sinus mucosal thickening.

## 2020-10-28 NOTE — Telephone Encounter (Signed)
I spoke to the patient. He just started taking lamotrigine 100mg  about 5 days ago after a slow titration. He should be taking it BID but is only taking one tab daily. His BP yesterday was 195/89. It is 215/97 today. He really should proceed to the ED.   I also spoke to Dr. Krista Blue. She does not feel this elevated BP is related to lamotrigine. He should take it BID. She also feels he needs to proceed to the ED for treatment of his increased BP.  I called the patient again. He verbalized understanding of Dr. Rhea Belton response. He is in agreement to go to the hospital.

## 2020-10-28 NOTE — ED Provider Notes (Signed)
[facility] DEPT Provider Note   CSN: [id number] Arrival date & time: 10/28/20  1731    History Chief Complaint  Patient presents with  . Stroke Symptoms   Tshombe Doolin is a 70 y.o. male with PMH of a brain lesion, A. Fib on xarelto, GERD, HTN, and OSA on CPAP who presents for evaluation of stroke symptoms. Per spouse, patient has had some right-sided weakness since a lesion was found in his brain 7 months ago. Patient was started on Lamictal 1 week ago and since then, he has had increased loss of balance. Patient was became more confused today described a "foggy" by spouse. Deficits since the identification of the lesion includes lightheadedness and "buckling" and falling when he stands up after sitting down for a long time. He denies any headache, blurry vision, SOB, chest pain, leg pain or numbness/tingling. Off note, patient also reports his BP was elevated to sBP in the 230s today which prompted him to call his PCP who then advised him to come to the ED. Patient states he currently feels back to baseline.     Past Medical History:  Diagnosis Date  . Allergic rhinitis   . Atrial fibrillation with rapid ventricular response ([place])   . Benign neoplasm of right choroid   . BPH (benign prostatic hyperplasia)   . Brain tumor (benign) ([place])   . Chest pain   . Chronic headaches   . Constipation   . Diverticulosis of colon   . Dysplastic nevus   . Dysrhythmia 2019   Afib  . Fatigue   . Gastroesophageal reflux disease without esophagitis   . GERD (gastroesophageal reflux disease)    prn med. control  . H/O thyroidectomy   . Headache   . History of adenomatous polyp of colon    03-29-2007  tubular adenoma/  08-07-2016  hyperplastic and tubular adenoma's  . History of alcoholic gastritis    [id number]  gastritis, esophagitis, peptic duodenitis  . History of colonic diverticulitis    01-31-2016  resolved w/ medications  . History of Helicobacter pylori  infection    10-30-2008  . 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 Hypertension   . Hypothyroidism   . Insomnia   . Internal hemorrhoids   . Left leg weakness   . Memory loss   . Nocturia   . OSA on CPAP    CPAP nightly  . Paresthesia of both feet   . Retinal pigmentation   . 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[place])   . Right thyroid nodule   . Sinus bradycardia   . Urinary retention 12/13/2016    Patient Active Problem List   Diagnosis Date Noted  . Fall 10/13/2020  . Abnormal brain MRI 10/13/2020  . Abnormal blood chemistry 10/13/2020  . Hemiparesis affecting right side as late effect of cerebrovascular accident ([place]) 09/28/2020  . Lesion of left frontal lobe of brain 04/06/2020  . Other persistent atrial fibrillation ([place])   . Bradycardia 02/15/2018  . Agatston coronary artery calcium score between 100 and 199 02/15/2018  . FUO (fever of unknown origin) 08/16/2017  . Goiter 08/16/2017  . Status post thyroidectomy 08/16/2017  . Hypertension 08/16/2017  . Dyslipidemia 08/16/2017  . Obstructive sleep apnea 08/16/2017  . Memory loss 07/25/2017  . Enlarged prostate with urinary obstruction 01/04/2017  . Prolapsed internal hemorrhoids, grade 3 09/13/2016  . Eustachian tube dysfunction, bilateral 05/19/2016    Past Surgical History:  Procedure Laterality Date  . APPLICATION OF CRANIAL NAVIGATION N/A 04/06/2020   Procedure: APPLICATION OF CRANIAL NAVIGATION;  Surgeon: Judith Part, MD;  Location: [facility];  Service: Neurosurgery;  Laterality: N/A;  . BRAIN SURGERY    . CARDIOVERSION N/A 12/23/2018   Procedure: CARDIOVERSION;  Surgeon: Buford Dresser, MD;  Location[facility] ENDOSCOPY;  Service: Cardiovascular;  Laterality: N/A;  . COLONOSCOPY  last one 08-07-2016  . EYE SURGERY    . FRAMELESS  BIOPSY WITH BRAINLAB Left 04/06/2020   Procedure: Left Stereotactic brain biopsy with brainlab;  Surgeon: Judith Part, MD;   Location: [facility];  Service: Neurosurgery;  Laterality: Left;  . HEMORRHOID BANDING  10-20-2016;  09-11-2016  . HERNIA REPAIR    . KNEE ARTHROSCOPY Bilateral    10 + yrs ago  . LASIK Bilateral   . PILONIDAL CYST EXCISION    . THYROIDECTOMY Left 2013  . TRANSURETHRAL RESECTION OF PROSTATE N/A 01/04/2017   Procedure: TRANSURETHRAL RESECTION OF THE PROSTATE (TURP);  Surgeon: Franchot Gallo, MD;  Location: [facility];  Service: Urology;  Laterality: N/A;  . UPPER GASTROINTESTINAL ENDOSCOPY  10/30/2008  . WISDOM TOOTH EXTRACTION       Family History  Problem Relation Age of Onset  . Heart attack Mother        33's  . Other Father        atherosclerosis - 33's  . Prostate cancer Maternal Grandfather   . Other Sister        brain tumor - unsure if it was cancer - 69's  . Colon cancer Neg Hx   . Esophageal cancer Neg Hx   . Pancreatic cancer Neg Hx   . Rectal cancer Neg Hx   . Stomach cancer Neg Hx     Social History   Tobacco Use  . Smoking status: Former Research scientist (life sciences)  . Smokeless tobacco: Never Used  Vaping Use  . Vaping Use: Never used  Substance Use Topics  . Alcohol use: Yes    Comment: social   . Drug use: No    Home Medications Prior to Admission medications   Medication Sig Start Date End Date Taking? Authorizing Provider  lamoTRIgine (LAMICTAL) 100 MG tablet Take 1 tablet (100 mg total) by mouth 2 (two) times daily. 10/13/20  Yes Marcial Pacas, MD  acetaminophen (TYLENOL) 500 MG tablet Take 1,000 mg by mouth 2 (two) times daily as needed for headache.    [provider]  amLODipine (NORVASC) 5 MG tablet Take 5 mg by mouth daily.    [provider]  carboxymethylcellulose (REFRESH PLUS) 0.5 % SOLN Place 1 drop into both eyes 4 (four) times daily as needed (dry eyes).    [provider]  diltiazem (CARDIZEM) 30 MG tablet Take 1 tablet (30 mg total) by mouth as needed. For a-fib or palpitations. 10/15/20   Richardson Dopp T, PA-C   EPINEPHrine 0.3 mg/0.3 mL IJ SOAJ injection See admin instructions. 08/11/20   [provider]  eszopiclone (LUNESTA) 2 MG TABS tablet Take 2 mg by mouth at bedtime. 05/26/20   [provider]  rosuvastatin (CRESTOR) 5 MG tablet TAKE 1/2 TABLET(2.5 MG) BY MOUTH DAILY 06/28/20   Fay Records, MD  XARELTO 20 MG TABS tablet TAKE 1 TABLET(20 MG) BY MOUTH DAILY WITH SUPPER 04/28/20   Fay Records, MD    Allergies    Other, Sulfa antibiotics, Sulfasalazine, and Metronidazole  Review of Systems   Review of Systems  Constitutional:  Negative for fever.  Eyes: Negative for visual disturbance.  Respiratory: Negative for shortness of breath.   Cardiovascular: Negative for chest pain.  Gastrointestinal: Negative for abdominal pain, nausea and vomiting.  Genitourinary: Negative for dysuria.  Musculoskeletal: Positive for gait problem.  Skin: Negative for rash.  Neurological: Positive for weakness and light-headedness. Negative for dizziness, tremors, syncope, facial asymmetry, numbness and headaches.  Psychiatric/Behavioral: Negative for confusion.    Physical Exam Updated Vital Signs BP 134/70   Pulse (!) 57   Temp 98.6 F (37 C) (Oral)   Resp 18   SpO2 96%   Physical Exam Constitutional:      Appearance: Normal appearance.  HENT:     Head: Normocephalic and atraumatic.  Eyes:     Extraocular Movements: Extraocular movements intact.     Conjunctiva/sclera: Conjunctivae normal.     Pupils: Pupils are equal, round, and reactive to light.  Cardiovascular:     Rate and Rhythm: Regular rhythm. Bradycardia present.     Pulses: Normal pulses.     Heart sounds: Normal heart sounds.  Pulmonary:     Effort: Pulmonary effort is normal.     Breath sounds: Normal breath sounds.  Abdominal:     General: Abdomen is flat. Bowel sounds are normal.     Palpations: Abdomen is soft.  Musculoskeletal:        General: Normal range of motion.     Cervical back: Normal range of  motion and neck supple.  Skin:    General: Skin is warm and dry.  Neurological:     General: No focal deficit present.     Mental Status: He is alert and oriented to person, place, and time.     Sensory: No sensory deficit.     Motor: No weakness.     Comments: 5/5 strength in all extremities. Normal sensation.   Psychiatric:        Mood and Affect: Mood normal.        Behavior: Behavior normal.     ED Results / Procedures / Treatments   Labs (all labs ordered are listed, but only abnormal results are displayed) Labs Reviewed  CBC WITH DIFFERENTIAL/PLATELET - Abnormal; Notable for the following components:      Result Value   RBC 4.14 (*)    All other components within normal limits  COMPREHENSIVE METABOLIC PANEL  URINALYSIS, ROUTINE W REFLEX MICROSCOPIC    EKG EKG Interpretation  Date/Time:  Thursday Oct 28 2020 17:49:20 EDT Ventricular Rate:  62 PR Interval:  178 QRS Duration: 138 QT Interval:  417 QTC Calculation: 424 R Axis:   43 Text Interpretation: Sinus rhythm Right bundle branch block similar to previous Confirmed by Theotis Burrow [phone]) on 10/28/2020 8:16:36 PM   Radiology CT Head Wo Contrast  Result Date: 10/28/2020 CLINICAL DATA:  Mental status change, persistent or worsening. Additional provided: Possible stroke symptoms, mental status change. EXAM: CT HEAD WITHOUT CONTRAST TECHNIQUE: Contiguous axial images were obtained from the base of the skull through the vertex without intravenous contrast. COMPARISON:  Prior brain MRI examinations 10/08/2020 and earlier. Head CT [id number]. FINDINGS: Brain: Stable cerebral volume. There is abnormal hypodensity within and surrounding the left basal ganglia, corresponding with the site of signal abnormality and patchy abnormal enhancement demonstrated on the brain MRI of 10/08/2020. Redemonstrated biopsy tract extending toward this region through the left frontal lobe. There is no acute intracranial hemorrhage. No acute  demarcated cortical infarct. No extra-axial fluid collection. No midline shift. Vascular: No  hyperdense vessel.  Atherosclerotic calcifications. Skull: Normal. Negative for fracture or focal lesion. Sinuses/Orbits: Visualized orbits show no acute finding. Mild bilateral ethmoid sinus mucosal thickening. IMPRESSION: Abnormal hypodensity within and surrounding the left basal ganglia, corresponding with the site of signal abnormality and patchy abnormal enhancement demonstrated on the recent prior brain MRI of 10/08/2020. No evidence of interval acute intracranial abnormality. Redemonstrated biopsy tract traversing the left frontal lobe. Mild bilateral ethmoid sinus mucosal thickening. Electronically Signed   By: Kellie Simmering DO   On: 10/28/2020 20:11    Procedures Procedures   Medications Ordered in ED Medications - No data to display  ED Course  I have reviewed the triage vital signs and the nursing notes.  Pertinent labs & imaging results that were available during my care of the patient were reviewed by me and considered in my medical decision making (see chart for details).    MDM Rules/Calculators/A&P                          70 yo male with history of a brain lesion, A. Fib on xarelto, GERD, HTN, and OSA on CPAP who presents for evaluation of stroke-like symptoms.  Patient showed no neuro deficits on exam. BP improved to sBP in the 130s-150s. UA, CBC, CMP unremarkable. CT head shows stable left basal ganglia lesion but no evidence of interval acute intracranial abnormality. Assessment shows there is no significant evidence of change from patient's baseline so will discharge home to follow up with PCP, cardiologist and neurologist.   Final Clinical Impression(s) / ED Diagnoses Final diagnoses:  Lesion of basal ganglia  Right sided weakness    Rx / DC Orders ED Discharge Orders    None       Lacinda Axon, MD 10/29/20 0305    Rex Kras Wenda Overland, MD 11/04/20 1342

## 2020-10-28 NOTE — ED Notes (Signed)
As per MD Little, pt is not a code stroke.

## 2020-10-28 NOTE — ED Triage Notes (Addendum)
Pt walked into ed with wife; c/o increased confusion since 130 this afternoon.last known well 12 pm.  Pt a&ox4 at this time, but wife states he is "foggy". No neuro defecits noted. pts wife states he has had neuro difficulties x 7 months r/t a lesion of L side of brain.

## 2020-10-28 NOTE — Telephone Encounter (Signed)
Pt called,my BP is 215/97, I think Lamotrigine is effecting my BP. I think I need to stop this medication. Would like a a call from the nurse.  I Cory Mathis) ask Pt if he need to go to the ED. Pt stated, no I do not.

## 2020-10-28 NOTE — Discharge Instructions (Signed)
We did not find any acute changes on your labs or imaging.  Please follow-up with your PCP or cardiologist to manage your blood pressure medications if it stays high. Also follow-up with your neurologist if your symptoms worsen.

## 2020-10-28 NOTE — ED Notes (Signed)
MD Little notified of pts symptoms.

## 2020-11-01 ENCOUNTER — Ambulatory Visit: Payer: Medicare Other | Admitting: Neurology

## 2020-11-02 DIAGNOSIS — J3089 Other allergic rhinitis: Secondary | ICD-10-CM | POA: Diagnosis not present

## 2020-11-02 DIAGNOSIS — J301 Allergic rhinitis due to pollen: Secondary | ICD-10-CM | POA: Diagnosis not present

## 2020-11-09 ENCOUNTER — Other Ambulatory Visit: Payer: Self-pay | Admitting: *Deleted

## 2020-11-09 ENCOUNTER — Telehealth: Payer: Self-pay | Admitting: Neurology

## 2020-11-09 DIAGNOSIS — G939 Disorder of brain, unspecified: Secondary | ICD-10-CM

## 2020-11-09 DIAGNOSIS — Z79899 Other long term (current) drug therapy: Secondary | ICD-10-CM

## 2020-11-09 DIAGNOSIS — R799 Abnormal finding of blood chemistry, unspecified: Secondary | ICD-10-CM

## 2020-11-09 NOTE — Telephone Encounter (Signed)
Please let patient know, I have talked with neuro oncologist Dr. Mickeal Skinner, who agreed to see patient's again,  Please double check lamotrigine dose,  I have put in the laboratory order, including lamotrigine trough level, he should have laboratory done in the morning before his morning dose of lamotrigine  Orders Placed This Encounter  Procedures  . Lamotrigine level  . Thyroid Panel With TSH

## 2020-11-09 NOTE — Telephone Encounter (Signed)
    Please connect me with Dr. Cecil Cobbs [phone]      Documentation

## 2020-11-09 NOTE — Telephone Encounter (Signed)
Please connect me with Dr. Cecil Cobbs [phone]

## 2020-11-09 NOTE — Telephone Encounter (Addendum)
I spoke to the patient and his wife. He has been titrating his lamotrigine with 100mg  tabs rather than 25mg  tabs. He is currently taking 100mg  TID. Per vo by Dr. Krista Blue, hold this evening dose. Come in for labs prior to morning dose. He will also increase his water intake today. He verbalized understanding of the correct instructions.   They are aware to expect a call from Dr. Renda Rolls office to schedule a follow up.

## 2020-11-09 NOTE — Telephone Encounter (Signed)
Cory Pacas, Cory Mathis Just now (11:24 AM)       Please let patient know, I have talked with neuro oncologist Dr. Mickeal Skinner, who agreed to see patient's again,  Please double check lamotrigine dose,  I have put in the laboratory order, including lamotrigine trough level, he should have laboratory done in the morning before his morning dose of lamotrigine     Orders Placed This Encounter  Procedures  . Lamotrigine level  . Thyroid Panel With TSH

## 2020-11-09 NOTE — Telephone Encounter (Addendum)
Dr. Krista Blue speaking to Dr. Mickeal Skinner.

## 2020-11-09 NOTE — Telephone Encounter (Signed)
Pt has called to report that after 3 weeks of being on the lamoTRIgine (LAMICTAL) 100 MG tablet up to 300 mg his memory is worse and he is having difficulty walking.  Please call pt.

## 2020-11-09 NOTE — Addendum Note (Signed)
Addended by: Marcial Pacas on: 11/09/2020 11:24 AM   Modules accepted: Orders

## 2020-11-10 ENCOUNTER — Ambulatory Visit (INDEPENDENT_AMBULATORY_CARE_PROVIDER_SITE_OTHER): Payer: Medicare Other | Admitting: Neurology

## 2020-11-10 ENCOUNTER — Telehealth: Payer: Self-pay | Admitting: Internal Medicine

## 2020-11-10 ENCOUNTER — Other Ambulatory Visit (INDEPENDENT_AMBULATORY_CARE_PROVIDER_SITE_OTHER): Payer: Self-pay

## 2020-11-10 ENCOUNTER — Encounter: Payer: Self-pay | Admitting: Neurology

## 2020-11-10 VITALS — BP 132/68 | HR 59 | Ht 70.0 in | Wt 210.0 lb

## 2020-11-10 DIAGNOSIS — R410 Disorientation, unspecified: Secondary | ICD-10-CM

## 2020-11-10 DIAGNOSIS — R9089 Other abnormal findings on diagnostic imaging of central nervous system: Secondary | ICD-10-CM

## 2020-11-10 DIAGNOSIS — Z0289 Encounter for other administrative examinations: Secondary | ICD-10-CM

## 2020-11-10 DIAGNOSIS — R799 Abnormal finding of blood chemistry, unspecified: Secondary | ICD-10-CM

## 2020-11-10 DIAGNOSIS — Z79899 Other long term (current) drug therapy: Secondary | ICD-10-CM | POA: Diagnosis not present

## 2020-11-10 DIAGNOSIS — I2510 Atherosclerotic heart disease of native coronary artery without angina pectoris: Secondary | ICD-10-CM

## 2020-11-10 DIAGNOSIS — G939 Disorder of brain, unspecified: Secondary | ICD-10-CM

## 2020-11-10 MED ORDER — MEMANTINE HCL 10 MG PO TABS: 10.0000 mg | ORAL_TABLET | Freq: Two times a day (BID) | ORAL | 11 refills | Status: DC

## 2020-11-10 NOTE — Progress Notes (Signed)
Chief Complaint  Patient presents with  . Follow-up    Work-in. Lesion of left frontal lobe of brain. Memory loss. He is here with his wife, [name], to discuss his medications.       ASSESSMENT AND PLAN  Cory Mathis is a 70 y.o. male   Left hemisphere basal ganglia and adjacent white matter T2 hyperintensity  Brain biopsy showed hypercellular brain tissue, with no inflammatory infiltration, the inflammatory cells including CD3 positive T cells, CD20 positive B cells, and CD68 positive macrophages, there is no evidence of vasculitis of granulomatous inflammation, organisms are not identified, LDH high, R132H, toxoplasmosis, SV40 and HSV immunohistochemical stains are negative, the histology does not suggest a glial neuroblastoma,  Patient has temporary improvement clinical and imaging wise following steroid treatment, but most recent repeat MRI of the brain with without contrast on October 08, 2020, after stopping steroid, showed recurrent disease in and about left basal ganglion, does correlate with his reported worsening intermittent right-sided weakness,    Cannot rule out the possibility of primary central nervous system neoplasma, differentiation diagnoses also include inflammatory, infectious, demyelinating, histology does not suggest a glial neoplasm.  He has an appointment with [facility] neurologist in September 2022,  Call his previous neuro oncologist Dr.Vaslow, because of recurrent left basal ganglia lesion by MRI, will continue follow-up on him  Reported episode of sudden onset of right leg weakness,  Possible partial seizure  EEG on Nov 08, 2020 was normal  Keppra 500 mg twice daily reported irritation, has already stopped the [place] himself  He was given lamotrigine, apparently he was confused, instead of titrating up dose slowly from 25 mg tablets, he mistakenly take higher dose of lamotrigine, up to 300 mg daily  His wife reported that since lamotrigine, his functional level has  declined, more confused, more intermittent right leg buckle, weakness, he has stopped the lamotrigine, had advised him to stay off lamotrigine, check level,   DIAGNOSTIC DATA (LABS, IMAGING, TESTING) - I reviewed patient records, labs, notes, testing and imaging myself where available.  The biopsy shows hypercellular brain tissue with a brisk perivascular  and parenchymal inflammatory infiltrate. The inflammatory cells include  CD3 positive T cells, CD20 positive B cells and CD68 positive  macrophages. There is no evidence of vasculitis or granulomatous  inflammation, and organisms are not identified. IDH1 R132H,  toxoplasmosis, SV40 and HSV immunohistochemical stains are negative.  The differential diagnosis may include inflammatory or infectious  etiologies, demyelinating disease and others. The histology does not  suggest a glial neoplasm, although due to clinical concern for  glioblastoma we are performing EGFR and TERT studies to further  evaluate. If the lesion progresses or if there is concern for  potentially undersampled lesion consider repeat biopsy if clinically    INTERPRETATION SUMMARY: EGFR does not exhibit amplification. The number of cells with a gain is less than 15%.   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 No interval evolution or specific finding to clarify the diagnosis.  HISTORICAL Cory Mathis is a 70 year old male, seen in refer by primary care doctor [name], for evaluation short-term memory loss, initial evaluation was on July 25, 2017.  I reviewed and summarized the referring note, he has past history of anxiety, hearing loss, goiter status post resection, hyperlipidemia, hypertension, he is a retired Financial trader from [employer], moved to [place] in  April 2015.  He also reported a history of fever of  unknown etiology, had 2 episodes 20 years apart, most recent episode was in 2014, fever up to 103, lasting for couple weeks, without clear etiology found, he was seen by different specialist, including rheumatologist, ID specialist, symptoms eventually improved by steroid tapering dose, treatment lasted for a few months.  Since 2015, he noticed mild memory loss, he can still keep very complicated spreadsheets without difficulty driving without loss, but in his daily activities, he let the water running for hours, sometimes forget why he goes to upstairs,  He also complains of chronic insomnia, gradually getting worse, was seen by sleep specialist Dr. Osborn, was diagnosis of obstructive sleep apnea, using CPAP machine, also taking titrating dose of Ambien 12.5 mg every night, but sometimes it does not work.  UPDATE September 17 2017: He got fever again at the end of February, had extensive laboratory evaluation by his primary care doctor Via, was also seen by infectious disease Dr. Campbell, no etiology was found, he was treated with prednisone, whenever he taper off the prednisone, his fever come back at 4 PM, now is on 25 mg daily, continue complains of difficulty sleeping at nighttime, taking Ambien 12.5 mg daily We have personally reviewed MRI of the brain without contrast in February 2019 that was normal  Laboratory evaluation showed normal CBC CMP, TSH, fasting lipid profile, LDL was 86  Update September 28, 2020 He lost to follow-up since April 2019, on October 2021, he complains of increased headache, mental confusion, right arm weakness, CT head and later MRI of the brain with without contrast March 30, 2020 showed 4.5 cm infiltrating mass centered at the base of the brain on the left with involvement of the basal ganglion, nonradiating white matter tracts, mass-effect upon left lateral ventricle, no midline shift, which raised the findings most worrisome for glioblastoma  He underwent left  stereotactic brain biopsy by Dr. Thomas Ostergard on April 06, 2020, pathology reported hypercellular brain tissue with inflammatory infiltration,   The biopsy shows hypercellular brain tissue with a brisk perivascular  and parenchymal inflammatory infiltrate. The inflammatory cells include  CD3 positive T cells, CD20 positive B cells and CD68 positive  macrophages. There is no evidence of vasculitis or granulomatous  inflammation, and organisms are not identified. IDH1 R132H,  toxoplasmosis, SV40 and HSV immunohistochemical stains are negative.  The differential diagnosis may include inflammatory or infectious  etiologies, demyelinating disease and others. The histology does not  suggest a glial neoplasm, although due to clinical concern for  glioblastoma we are performing EGFR and TERT studies to further  evaluate. If the lesion progresses or if there is concern for  potentially undersampled lesion consider repeat biopsy if clinically  indicated EGFR does not exhibit amplification, the number of cells with again is less than 15%,  He was managed by Dr. 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  Patient complete Decadron 2 mg from November 18 to May 09, 2020, steroid free for few days, then developed a rash on his face, and neck, did not improve with hydrocortisone cream, on May 17, 2020, he was given hydrocortisone 20 mg in the morning, 30 tablets,  He presented to emergency room on June 19, 2020 for balance issues, lightheadedness, fall, worsening headache, he did not get second brain biopsy because significant improvement with steroid treatment continue MRI surveillance  Since February 2022, he also began to experience episode of sudden onset loss control of his leg, fell to the ground, less than 1 minute, Dr. Vaslow started Keppra 500 mg twice daily, patient only take it for 3 days, complains of dizziness, increased leg weakness, Keppra was stopped, he was given another trial of dexamethasone on July 19, 2020 4 mg daily for 1 week, reduce to 2 mg daily on July 27, 2020  At most recent follow-up with Dr. Vaslow August 09, 2020, patient and his wife describe ongoing deficit including short-term memory loss, occasional loss of balance, dropped things from his right hand, improved  headache control  MRI of brain with without contrast on June 19, 2020, slight interval increase in T2/flair signal abnormality involving left basal ganglia compared to previous MRI on April 30, 2020,  MRI of the brain with without contrast August 06, 2020, this is following her second round of Decadron treatment, again substantial decrease of T2 hyperdensity in the left basal ganglion and white matter, that correlate with steroid treatment  Extensive laboratory evaluation showed normal or negative  CMP, UDS, elevated WBC 11.7, normal hemoglobin 14.2, negative Lyme titer, C-reactive protein, ANA, TSH, LDL 47  UPDATE Oct 13 2020: Patient and his wife return to clinic to review repeat MRI on October 08, 2020: Which showed recurrent disease in and about the left basal ganglion,  although not as extensive as symptom presentation, but certainly showed clear appearing recurrent disease compared to his most recent MRI scan in February, which is his past following his second dose of Decadron treatment  Patient also reported clinical worsening, he has increased episode of intermittent right arm and leg weakness, his right leg would give out on him, each episode lasted about few seconds, no loss of consciousness, but wife reported that during the spell he looks spaced out,  He complains of mild irritation with Keppra 500 mg twice daily, also reported increased episode of transient right-sided weakness, he contributed to the side effect of Keppra, has stopped taking medicine,   He was contacted by [facility] Dr. Freeman, who has reviewed his recent MRI of the brain, suggested having not to follow-up with [facility],  After extensive discussion with patient and his wife, he wants to proceed with further evaluation with his ongoing worsening clinical and MRI findings, I have suggested spinal fluid testing, he wants to hold off at this point, would rather go to [facility] Dr. Joel Morganlander for second opinion  UPDATE November 10 2020: Patient and his wife came urgently to the office without appointment, he was given prescription of lamotrigine 25 mg, supposed to titrating up lamotrigine to target dose of 100 mg twice a day, instead he never filled his lamotrigine 25 mg prescription, begin taking lamotrigine 100 mg daily for 1 week, then twice daily for 1 week, over the past few days, he has been   taking 100 mg 3 times daily  His wife reported that over the past few weeks he has slow worsening functional status, more episode of confusion, right-sided weakness, unsteadiness, he has stopped lamotrigine yesterday after talking with our office, lamotrigine level is pending today  Wife is very anxious, so is patient frustrated about his worsening functional status, repeat MRI of  the brain in April 2022 clearly showed recurrent left basal ganglia lesion in comparison to February 2022, where he had improvement following steroid treatment, we again talked about potential further evaluation with lumbar puncture, he and his wife performed to have second opinion at [facility] neurology  REVIEW OF SYSTEMS:  Full 14 system review of systems performed and notable only for as above All other review of systems were negative.  PHYSICAL EXAM:   Vitals:   11/10/20 0850  BP: 132/68  Pulse: (!) 59  Weight: 210 lb (95.3 kg)  Height: 5' 10" (1.778 m)   Not recorded     Body mass index is 30.13 kg/m.  PHYSICAL EXAMNIATION:  Gen: NAD, conversant, well nourised, well groomed                     Cardiovascular: Regular rate rhythm, no peripheral edema, warm, nontender. Eyes: Conjunctivae clear without exudates or hemorrhage Neck: Supple, no carotid bruits. Pulmonary: Clear to auscultation bilaterally   NEUROLOGICAL EXAM:  MENTAL STATUS: Speech:   Montreal Cognitive Assessment  09/28/2020  Visuospatial/ Executive (0/5) 5  Naming (0/3) 3  Attention: Read list of digits (0/2) 2  Attention: Read list of letters (0/1) 1  Attention: Serial 7 subtraction starting at 100 (0/3) 3  Language: Repeat phrase (0/2) 2  Language : Fluency (0/1) 0  Abstraction (0/2) 2  Delayed Recall (0/5) 3  Orientation (0/6) 6  Total 27  Adjusted Score (based on education) 27     CRANIAL NERVES: CN II: Visual fields are full to confrontation. Pupils are round equal and briskly reactive to light. CN III, IV, VI: extraocular movement are normal. No ptosis. CN V: Facial sensation is intact to light touch CN VII: Face is symmetric with normal eye closure  CN VIII: Hearing is normal to causal conversation. CN IX, X: Phonation is normal. CN XI: Head turning and shoulder shrug are intact  MOTOR: Fixation of right arm upon rapid rotating movement, mild right upper extremity proximal muscle  weakness  REFLEXES: Hyperreflexia of right upper and lower extremity  SENSORY: Intact to light touch, pinprick and vibratory sensation are intact in fingers and toes.  COORDINATION: There is no trunk or limb dysmetria noted.  GAIT/STANCE: He can get up from seated position arm crossed, decreased right arm swing, dragging right leg slightly Romberg is absent.  ALLERGIES: Allergies  Allergen Reactions  . Other     Mints - makes him sneeze  . Sulfa Antibiotics Hives and Rash  . Sulfasalazine Hives and Other (See Comments)    other  . Metronidazole Other (See Comments)    Pt reported he was light headed for 2 weeks.      HOME MEDICATIONS: Current Outpatient Medications  Medication Sig Dispense Refill  . acetaminophen (TYLENOL) 500 MG tablet Take 1,000 mg by mouth 2 (two) times daily as needed for headache.    Marland Kitchen amLODipine (NORVASC) 5 MG tablet Take 5 mg by mouth daily.    . carboxymethylcellulose (REFRESH PLUS) 0.5 % SOLN Place 1 drop into both eyes 4 (four) times daily as needed (dry eyes).    Marland Kitchen  diltiazem (CARDIZEM) 30 MG tablet Take 1 tablet (30 mg total) by mouth as needed. For a-fib or palpitations. 30 tablet 3  . EPINEPHrine 0.3 mg/0.3 mL IJ SOAJ injection Inject 0.3 mg into the muscle as needed for anaphylaxis.    Marland Kitchen eszopiclone (LUNESTA) 2 MG TABS tablet Take 2 mg by mouth at bedtime.    . lamoTRIgine (LAMICTAL) 100 MG tablet Take 1 tablet (100 mg total) by mouth 2 (two) times daily. 60 tablet 11  . rosuvastatin (CRESTOR) 5 MG tablet TAKE 1/2 TABLET(2.5 MG) BY MOUTH DAILY 45 tablet 2  . XARELTO 20 MG TABS tablet TAKE 1 TABLET(20 MG) BY MOUTH DAILY WITH SUPPER 30 tablet 6   No current facility-administered medications for this visit.    PAST MEDICAL HISTORY: Past Medical History:  Diagnosis Date  . Allergic rhinitis   . Atrial fibrillation with rapid ventricular response ([place])   . Benign neoplasm of right choroid   . BPH (benign prostatic hyperplasia)   . Brain  tumor (benign) ([place])   . Chest pain   . Chronic headaches   . Constipation   . Diverticulosis of colon   . Dysplastic nevus   . Dysrhythmia 2019   Afib  . Fatigue   . Gastroesophageal reflux disease without esophagitis   . GERD (gastroesophageal reflux disease)    prn med. control  . H/O thyroidectomy   . Headache   . History of adenomatous polyp of colon    03-29-2007  tubular adenoma/  08-07-2016  hyperplastic and tubular adenoma's  . History of alcoholic gastritis    [phone]  gastritis, esophagitis, peptic duodenitis  . History of colonic diverticulitis    01-31-2016  resolved w/ medications  . History of Helicobacter pylori infection    10-30-2008  . 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 Hypertension   . Hypothyroidism   . Insomnia   . Internal hemorrhoids   . Left leg weakness   . Memory loss   . Nocturia   . OSA on CPAP    CPAP nightly  . Paresthesia of both feet   . Retinal pigmentation   . 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[place])   . Right thyroid nodule   . Sinus bradycardia   . Urinary retention 12/13/2016    PAST SURGICAL HISTORY: Past Surgical History:  Procedure Laterality Date  . APPLICATION OF CRANIAL NAVIGATION N/A 04/06/2020   Procedure: APPLICATION OF CRANIAL NAVIGATION;  Surgeon: Judith Part, MD;  Location: [place];  Service: Neurosurgery;  Laterality: N/A;  . BRAIN SURGERY    . CARDIOVERSION N/A 12/23/2018   Procedure: CARDIOVERSION;  Surgeon: Buford Dresser, MD;  Location: [facility] ENDOSCOPY;  Service: Cardiovascular;  Laterality: N/A;  . COLONOSCOPY  last one 08-07-2016  . EYE SURGERY    . FRAMELESS  BIOPSY WITH BRAINLAB Left 04/06/2020   Procedure: Left Stereotactic brain biopsy with brainlab;  Surgeon: Judith Part, MD;  Location: [place];  Service: Neurosurgery;  Laterality: Left;  . HEMORRHOID BANDING  10-20-2016;  09-11-2016  . HERNIA REPAIR    . KNEE ARTHROSCOPY Bilateral    10 +  yrs ago  . LASIK Bilateral   . PILONIDAL CYST EXCISION    . THYROIDECTOMY Left 2013  . TRANSURETHRAL RESECTION OF PROSTATE N/A 01/04/2017   Procedure: TRANSURETHRAL RESECTION OF THE PROSTATE (TURP);  Surgeon: Franchot Gallo, MD;  Location: [facility];  Service: Urology;  Laterality: N/A;  . UPPER  GASTROINTESTINAL ENDOSCOPY  10/30/2008  . WISDOM TOOTH EXTRACTION      FAMILY HISTORY: Family History  Problem Relation Age of Onset  . Heart attack Mother        90's  . Other Father        atherosclerosis - 40's  . Prostate cancer Maternal Grandfather   . Other Sister        brain tumor - unsure if it was cancer - 60's  . Colon cancer Neg Hx   . Esophageal cancer Neg Hx   . Pancreatic cancer Neg Hx   . Rectal cancer Neg Hx   . Stomach cancer Neg Hx     SOCIAL HISTORY: Social History   Socioeconomic History  . Marital status: Married    Spouse name: Not on file  . Number of children: 0  . Years of education: 16+  . Highest education level: Bachelor's degree (e.g., BA, AB, BS)  Occupational History  . Occupation: Retired  Tobacco Use  . Smoking status: Former Smoker  . Smokeless tobacco: Never Used  Vaping Use  . Vaping Use: Never used  Substance and Sexual Activity  . Alcohol use: Yes    Comment: social   . Drug use: No  . Sexual activity: Not on file  Other Topics Concern  . Not on file  Social History Narrative   Left-handed.   1.5 cups caffeine per day.   Lives at home with his wife.   Social Determinants of Health   Financial Resource Strain: Not on file  Food Insecurity: Not on file  Transportation Needs: Not on file  Physical Activity: Not on file  Stress: Not on file  Social Connections: Not on file  Intimate Partner Violence: Not on file    Total time spent reviewing the chart, obtaining history, examined patient, ordering tests, documentation, consultations and family, care coordination was 45 minutes      , M.D.  Ph.D.  [facility] [address], [address] [place], [place] [address] Ph: [phone] Fax: [phone]  CC:  Wong, Francis P, MD [address] [place],  [place] [address]  Wong, Francis P, MD  

## 2020-11-10 NOTE — Telephone Encounter (Signed)
Scheduled appt per 5/31 sch msg. Pt aware.

## 2020-11-11 ENCOUNTER — Inpatient Hospital Stay: Payer: Medicare Other | Attending: Internal Medicine | Admitting: Internal Medicine

## 2020-11-11 ENCOUNTER — Other Ambulatory Visit: Payer: Self-pay

## 2020-11-11 VITALS — BP 133/65 | HR 59 | Temp 97.7°F | Resp 17 | Ht 70.0 in | Wt 209.6 lb

## 2020-11-11 DIAGNOSIS — I2510 Atherosclerotic heart disease of native coronary artery without angina pectoris: Secondary | ICD-10-CM | POA: Diagnosis not present

## 2020-11-11 DIAGNOSIS — Z8249 Family history of ischemic heart disease and other diseases of the circulatory system: Secondary | ICD-10-CM | POA: Insufficient documentation

## 2020-11-11 DIAGNOSIS — J301 Allergic rhinitis due to pollen: Secondary | ICD-10-CM | POA: Diagnosis not present

## 2020-11-11 DIAGNOSIS — R531 Weakness: Secondary | ICD-10-CM | POA: Diagnosis not present

## 2020-11-11 DIAGNOSIS — Z79899 Other long term (current) drug therapy: Secondary | ICD-10-CM | POA: Diagnosis not present

## 2020-11-11 DIAGNOSIS — G939 Disorder of brain, unspecified: Secondary | ICD-10-CM | POA: Insufficient documentation

## 2020-11-11 DIAGNOSIS — Z8042 Family history of malignant neoplasm of prostate: Secondary | ICD-10-CM | POA: Insufficient documentation

## 2020-11-11 DIAGNOSIS — E785 Hyperlipidemia, unspecified: Secondary | ICD-10-CM | POA: Insufficient documentation

## 2020-11-11 DIAGNOSIS — I480 Paroxysmal atrial fibrillation: Secondary | ICD-10-CM | POA: Diagnosis not present

## 2020-11-11 DIAGNOSIS — Z808 Family history of malignant neoplasm of other organs or systems: Secondary | ICD-10-CM | POA: Insufficient documentation

## 2020-11-11 DIAGNOSIS — Z87891 Personal history of nicotine dependence: Secondary | ICD-10-CM | POA: Insufficient documentation

## 2020-11-11 DIAGNOSIS — J3089 Other allergic rhinitis: Secondary | ICD-10-CM | POA: Diagnosis not present

## 2020-11-11 LAB — THYROID PANEL WITH TSH
Free Thyroxine Index: 1.7 (ref 1.2–4.9)
T3 Uptake Ratio: 25 % (ref 24–39)
T4, Total: 6.8 ug/dL (ref 4.5–12.0)
TSH: 1.15 u[IU]/mL (ref 0.450–4.500)

## 2020-11-11 LAB — LAMOTRIGINE LEVEL: Lamotrigine Lvl: 5.3 ug/mL (ref 2.0–20.0)

## 2020-11-11 MED ORDER — PREDNISONE 20 MG PO TABS: 40.0000 mg | ORAL_TABLET | Freq: Every day | ORAL | 0 refills | Status: DC

## 2020-11-11 NOTE — Progress Notes (Signed)
[facility] [place], [place] [address] [phone]   Interval Evaluation  Date of Service: 11/11/20 Patient Name: Cory Mathis Patient MRN: [id number] Patient DOB: [birth year] Provider: [name], MD  Identifying Statement:  Cory Mathis is a 70 y.o. male with left frontal mass   Oncologic History 04/06/20: Stereotactic biopsy by Dr. Zada Finders; path indeterminate  Biomarkers:  MGMT Unknown.  IDH 1/2 Unknown.  EGFR Unknown  TERT Unknown   Interval History:  Cory Mathis presents today after recent MRI brain for review.  Today he describes worsening left leg weakness, affecting his ability to walk.  He is currently utilizing a cane most of the time.  Does describe episodes, maybe 2-3x per week, described as "brief lightheaded, dizzy feeling after standing up from sitting position."  Suspecting seizures, Neurologist tried him on Lamictal, which was poorly tolerated and did not lessen the symptoms at all.  Headaches are not a problem lately.     H+P (04/16/20) Patient presented to medical attention in October 2021 with several weeks of daily headaches which were new.  Headaches are described as moderate pressure affecting bilateral temples, worse at night.  He also describes some modest impairment of short term memory, with some forgetting of names and dates.  No interference with day to day functioning.  He underwent brain MRI which demonstrated left frontal deep brain mass, which was biopsied on 04/06/20 by Dr. Zada Finders.  Since surgery he continues to have headaches but no other new or progressive changes.  Presents today for pathology review and treatment planning.    Medications: Current Outpatient Medications on File Prior to Visit  Medication Sig Dispense Refill  . acetaminophen (TYLENOL) 500 MG tablet Take 1,000 mg by mouth 2 (two) times daily as needed for headache.    Marland Kitchen amLODipine (NORVASC) 5 MG tablet Take 5 mg by  mouth daily.    . carboxymethylcellulose (REFRESH PLUS) 0.5 % SOLN Place 1 drop into both eyes 4 (four) times daily as needed (dry eyes).    Marland Kitchen diltiazem (CARDIZEM) 30 MG tablet Take 1 tablet (30 mg total) by mouth as needed. For a-fib or palpitations. 30 tablet 3  . EPINEPHrine 0.3 mg/0.3 mL IJ SOAJ injection Inject 0.3 mg into the muscle as needed for anaphylaxis.    Marland Kitchen eszopiclone (LUNESTA) 2 MG TABS tablet Take 2 mg by mouth at bedtime.    . memantine (NAMENDA) 10 MG tablet Take 1 tablet (10 mg total) by mouth 2 (two) times daily. 60 tablet 11  . rosuvastatin (CRESTOR) 5 MG tablet TAKE 1/2 TABLET(2.5 MG) BY MOUTH DAILY 45 tablet 2  . XARELTO 20 MG TABS tablet TAKE 1 TABLET(20 MG) BY MOUTH DAILY WITH SUPPER 30 tablet 6   No current facility-administered medications on file prior to visit.    Allergies:  Allergies  Allergen Reactions  . Other     Mints - makes him sneeze  . Sulfa Antibiotics Hives and Rash  . Sulfasalazine Hives and Other (See Comments)    other  . Metronidazole Other (See Comments)    Pt reported he was light headed for 2 weeks.     Past Medical History:  Past Medical History:  Diagnosis Date  . Allergic rhinitis   . Atrial fibrillation with rapid ventricular response ([facility])   . Benign neoplasm of right choroid   . BPH (benign prostatic hyperplasia)   . Brain tumor (benign) ([facility])   . Chest pain   .  Chronic headaches   . Constipation   . Diverticulosis of colon   . Dysplastic nevus   . Dysrhythmia 2019   Afib  . Fatigue   . Gastroesophageal reflux disease without esophagitis   . GERD (gastroesophageal reflux disease)    prn med. control  . H/O thyroidectomy   . Headache   . History of adenomatous polyp of colon    03-29-2007  tubular adenoma/  08-07-2016  hyperplastic and tubular adenoma's  . History of alcoholic gastritis    [id number]  gastritis, esophagitis, peptic duodenitis  . History of colonic diverticulitis    01-31-2016  resolved w/  medications  . History of Helicobacter pylori infection    10-30-2008  . 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 Hypertension   . Hypothyroidism   . Insomnia   . Internal hemorrhoids   . Left leg weakness   . Memory loss   . Nocturia   . OSA on CPAP    CPAP nightly  . Paresthesia of both feet   . Retinal pigmentation   . 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[facility])   . Right thyroid nodule   . Sinus bradycardia   . Urinary retention 12/13/2016   Past Surgical History:  Past Surgical History:  Procedure Laterality Date  . APPLICATION OF CRANIAL NAVIGATION N/A 04/06/2020   Procedure: APPLICATION OF CRANIAL NAVIGATION;  Surgeon: Judith Part, MD;  Location: [facility];  Service: Neurosurgery;  Laterality: N/A;  . BRAIN SURGERY    . CARDIOVERSION N/A 12/23/2018   Procedure: CARDIOVERSION;  Surgeon: Buford Dresser, MD;  [facility] ENDOSCOPY;  Service: Cardiovascular;  Laterality: N/A;  . COLONOSCOPY  last one 08-07-2016  . EYE SURGERY    . FRAMELESS  BIOPSY WITH BRAINLAB Left 04/06/2020   Procedure: Left Stereotactic brain biopsy with brainlab;  Surgeon: Judith Part, MD;  Location: [facility];  Service: Neurosurgery;  Laterality: Left;  . HEMORRHOID BANDING  10-20-2016;  09-11-2016  . HERNIA REPAIR    . KNEE ARTHROSCOPY Bilateral    10 + yrs ago  . LASIK Bilateral   . PILONIDAL CYST EXCISION    . THYROIDECTOMY Left 2013  . TRANSURETHRAL RESECTION OF PROSTATE N/A 01/04/2017   Procedure: TRANSURETHRAL RESECTION OF THE PROSTATE (TURP);  Surgeon: Franchot Gallo, MD;  Location: [facility];  Service: Urology;  Laterality: N/A;  . UPPER GASTROINTESTINAL ENDOSCOPY  10/30/2008  . WISDOM TOOTH EXTRACTION     Social History:  Social History   Socioeconomic History  . Marital status: Married    Spouse name: Not on file  . Number of children: 0  . Years of education: 16+  . Highest education level: Bachelor's degree  (e.g., BA, AB, BS)  Occupational History  . Occupation: Retired  Tobacco Use  . Smoking status: Former Research scientist (life sciences)  . Smokeless tobacco: Never Used  Vaping Use  . Vaping Use: Never used  Substance and Sexual Activity  . Alcohol use: Yes    Comment: social   . Drug use: No  . Sexual activity: Not on file  Other Topics Concern  . Not on file  Social History Narrative   Left-handed.   1.5 cups caffeine per day.   Lives at home with his wife.   Social Determinants of Health   Financial Resource Strain: Not on file  Food Insecurity: Not on file  Transportation Needs: Not on file  Physical Activity: Not on file  Stress: Not  on file  Social Connections: Not on file  Intimate Partner Violence: Not on file   Family History:  Family History  Problem Relation Age of Onset  . Heart attack Mother        15's  . Other Father        atherosclerosis - 74's  . Prostate cancer Maternal Grandfather   . Other Sister        brain tumor - unsure if it was cancer - 63's  . Colon cancer Neg Hx   . Esophageal cancer Neg Hx   . Pancreatic cancer Neg Hx   . Rectal cancer Neg Hx   . Stomach cancer Neg Hx     Review of Systems: Constitutional: Doesn't report fevers, chills or abnormal weight loss Eyes: Doesn't report blurriness of vision Ears, nose, mouth, throat, and face: Doesn't report sore throat Respiratory: Doesn't report cough, dyspnea or wheezes Cardiovascular: Doesn't report palpitation, chest discomfort  Gastrointestinal:  Doesn't report nausea, constipation, diarrhea GU: Doesn't report incontinence Skin: Doesn't report skin rashes Neurological: Per HPI Musculoskeletal: Doesn't report joint pain Behavioral/Psych: Doesn't report anxiety  Physical Exam: Vitals:   11/11/20 1046  BP: 133/65  Pulse: (!) 59  Resp: 17  Temp: 97.7 F (36.5 C)  SpO2: 99%   KPS: 70. General: Alert, cooperative, pleasant, in no acute distress Head: Normal EENT: No conjunctival injection or  scleral icterus.  Lungs: Resp effort normal Cardiac: Regular rate Abdomen: Non-distended abdomen Skin: No rashes cyanosis or petechiae. Extremities: No clubbing or edema  Neurologic Exam: Mental Status: Awake, alert, attentive to examiner. Oriented to self and environment. Language is fluent with intact comprehension.  Cranial Nerves: Visual acuity is grossly normal. Visual fields are full. Extra-ocular movements intact. No ptosis. Face is symmetric Motor: Tone and bulk are normal. Power is 4+/5 in right leg only. Reflexes are symmetric, no pathologic reflexes present.  Sensory: Intact to light touch Gait: Hemiparetic   Labs: I have reviewed the data as listed    Component Value Date/Time   NA 136 10/28/2020 1929   NA 139 08/22/2019 1432   K 3.7 10/28/2020 1929   CL 105 10/28/2020 1929   CO2 23 10/28/2020 1929   GLUCOSE 87 10/28/2020 1929   BUN 12 10/28/2020 1929   BUN 10 08/22/2019 1432   CREATININE 0.91 10/28/2020 1929   CREATININE 0.86 09/27/2017 0959   CALCIUM 9.4 10/28/2020 1929   PROT 7.4 10/28/2020 1929   ALBUMIN 4.1 10/28/2020 1929   AST 18 10/28/2020 1929   ALT 14 10/28/2020 1929   ALKPHOS 91 10/28/2020 1929   BILITOT 0.8 10/28/2020 1929   GFRNONAA >60 10/28/2020 1929   GFRAA 96 08/22/2019 1432   Lab Results  Component Value Date   WBC 9.3 10/28/2020   NEUTROABS 5.6 10/28/2020   HGB 13.3 10/28/2020   HCT 39.4 10/28/2020   MCV 95.2 10/28/2020   PLT 313 10/28/2020    Imaging:  [facility] Clinician Interpretation: I have personally reviewed the CNS images as listed.  My interpretation, in the context of the patient's clinical presentation, is progressive disease  CT Head Wo Contrast  Result Date: 10/28/2020 CLINICAL DATA:  Mental status change, persistent or worsening. Additional provided: Possible stroke symptoms, mental status change. EXAM: CT HEAD WITHOUT CONTRAST TECHNIQUE: Contiguous axial images were obtained from the base of the skull through the vertex  without intravenous contrast. COMPARISON:  Prior brain MRI examinations 10/08/2020 and earlier. Head CT [id number]. FINDINGS: Brain: Stable cerebral volume. There is abnormal  hypodensity within and surrounding the left basal ganglia, corresponding with the site of signal abnormality and patchy abnormal enhancement demonstrated on the brain MRI of 10/08/2020. Redemonstrated biopsy tract extending toward this region through the left frontal lobe. There is no acute intracranial hemorrhage. No acute demarcated cortical infarct. No extra-axial fluid collection. No midline shift. Vascular: No hyperdense vessel.  Atherosclerotic calcifications. Skull: Normal. Negative for fracture or focal lesion. Sinuses/Orbits: Visualized orbits show no acute finding. Mild bilateral ethmoid sinus mucosal thickening. IMPRESSION: Abnormal hypodensity within and surrounding the left basal ganglia, corresponding with the site of signal abnormality and patchy abnormal enhancement demonstrated on the recent prior brain MRI of 10/08/2020. No evidence of interval acute intracranial abnormality. Redemonstrated biopsy tract traversing the left frontal lobe. Mild bilateral ethmoid sinus mucosal thickening. Electronically Signed   By: Kellie Simmering DO   On: 10/28/2020 20:11   EEG adult  Result Date: 10/18/2020 Marcial Pacas, MD     10/21/2020  5:59 PM HISTORY: 70 year old male, presented with recurrent episode of sudden onset right side weakness, MRI showed left basal ganglion lesion, TECHNIQUE: This is a routine 16 channel EEG recording with one channel devoted to a limited EKG recording.  It was performed during wakefulness, drowsiness and asleep.  Hyperventilation and photic stimulation were performed as activating procedures.  There are minimum muscle and movement artifact noted. Upon maximum arousal, posterior dominant waking rhythm consistent of rhythmic alpha range activity,  activities are symmetric over the bilateral posterior derivations and  attenuated with eye opening. Hyperventilation produced mild/moderate buildup with higher amplitude and the slower activities noted. Photic stimulation did not alter the tracing. During EEG recording, patient developed drowsiness and no deeper stage of sleep was achieved. During EEG recording, there was no epileptiform discharge noted. EKG demonstrate sinus rhythm, with heart rate of 60 bpm CONCLUSION: This is a  normal awake EEG.  There is no electrodiagnostic evidence of epileptiform discharge. Marcial Pacas, M.D. Ph.D. 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  Motor deficits correlate with MRI findings of recurring L frontal, capsular inflammatory infiltrate.   Etiology of inflammatory syndrome remains uncertain.  He previously had very brisk, and relatively durable response to course of corticosteroids.  We will start him on prednisone 69m daily and give him a call in ~10 days to assess response and further the titration.  He is currently arranged to consult with [facility] neuro-immunology in early September, which we agree is the appropriate course of action given his syndrome relapse.  We did recommend repeating brain MRI in 2-3 months to characterize response to steroids, ensure no further progression of disease.  Episodic symptoms sound like orthostasis on history today, he does have paroxysmal afib and low resting heart rate; we encouraged follow up with his cardiology.     We ask that CMihran Lebarronreturn to clinic after next MRI in august.   All questions were answered. The patient knows to call the clinic with any problems, questions or concerns. No barriers to learning were detected.  The total time spent in the encounter was 40 minutes and more than 50% was on counseling and review of test  results   [name] MD Medical Director of Neuro-Oncology [facility]06/02/22 3:24 PM

## 2020-11-15 DIAGNOSIS — J3089 Other allergic rhinitis: Secondary | ICD-10-CM | POA: Diagnosis not present

## 2020-11-15 DIAGNOSIS — J301 Allergic rhinitis due to pollen: Secondary | ICD-10-CM | POA: Diagnosis not present

## 2020-11-18 NOTE — Progress Notes (Signed)
Cardiology Office Note   Date:  11/19/2020   ID:  Cory Mathis, DOB 1950-10-05, MRN [id number]  PCP:  Vernie Shanks, MD  Cardiologist:   Dorris Carnes, MD    Pt presents for f/u of CAD and PAF   History of Present Illness: Cory Mathis is a 70 y.o. male with a history ofHTN, HL, CAD by CT (Ca score of 186)  The pt also has a hx of PAF  In  June 2020 he was placed on cardiazem and Xarelto   Underwent cardioversion  He was switched to metorpolol from cardiazem  Developed bradycardia and metorpolol was stopped, cardiazem restarted.   I saw the pt in clinic in Sept 2021  The pt was seen by neuro for deep L frontal mass on CT and MRI in 2021   Bx showed inflammation   Rx steroids   Developed seizures   On Keppra   In April the pt had a CT scan  Had contrast reaction   Developed afib     Diltiazem incrased to 30 mg q 6 hours    He was seen in cardiology in April  Asymptomatic  IN June 2022 he was seen again in neuro   Described brief episodes of lightheadednss with standing   No synocpe   He says it still is occurring   With this R side gets week   Denies palpitations   Is drinking some watter   No CP     Since seen  H  Since seen he s done well from a cardiac standpoint  He denies palpitations  No CP  No SOB  No dizziness   He is very active   WOrks out every other day for 1.5 hours   He does note some sensations in feet, hands   Squeezing type   Not associated with activity Has L temporal HA Having some prblems since cataract surgery   BP at home 120s/  I saw the pt back in Sept 2021  HE was seen by Kathleen Argue on Oct 15 2020  Pt says hes has had syms after sitting      Stands   Feels week    dizzy   Lasts about 10 sec Symtpoms devel about a year    Getting more frequent  Twice yesterday     No symopce   Current Meds  Medication Sig   amLODipine (NORVASC) 5 MG tablet Take 5 mg by mouth daily.   EPINEPHrine 0.3 mg/0.3 mL IJ SOAJ injection Inject 0.3 mg into the muscle as needed for  anaphylaxis.   eszopiclone (LUNESTA) 2 MG TABS tablet Take 2 mg by mouth at bedtime.   memantine (NAMENDA) 10 MG tablet Take 1 tablet (10 mg total) by mouth 2 (two) times daily.   predniSONE (DELTASONE) 20 MG tablet Take 2 tablets (40 mg total) by mouth daily with breakfast.   rosuvastatin (CRESTOR) 5 MG tablet TAKE 1/2 TABLET(2.5 MG) BY MOUTH DAILY   XARELTO 20 MG TABS tablet TAKE 1 TABLET(20 MG) BY MOUTH DAILY WITH SUPPER     Allergies:   Other, Sulfa antibiotics, Sulfasalazine, and Metronidazole   Past Medical History:  Diagnosis Date   Allergic rhinitis    Atrial fibrillation with rapid ventricular response ([facility])    Benign neoplasm of right choroid    BPH (benign prostatic hyperplasia)    Brain tumor (benign) ([facility])    Chest pain    Chronic headaches    Constipation  Diverticulosis of colon    Dysplastic nevus    Dysrhythmia 2019   Afib   Fatigue    Gastroesophageal reflux disease without esophagitis    GERD (gastroesophageal reflux disease)    prn med. 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   Hypertension    Hypothyroidism    Insomnia    Internal hemorrhoids    Left leg weakness    Memory loss    Nocturia    OSA on CPAP    CPAP nightly   Paresthesia of both feet    Retinal pigmentation    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[place])    Right thyroid nodule    Sinus bradycardia    Urinary retention 12/13/2016    Past Surgical History:  Procedure Laterality Date   APPLICATION OF CRANIAL NAVIGATION N/A 04/06/2020   Procedure: APPLICATION OF  CRANIAL NAVIGATION;  Surgeon: Judith Part, MD;  Location: [facility];  Service: Neurosurgery;  Laterality: N/A;   BRAIN SURGERY     CARDIOVERSION N/A 12/23/2018   Procedure: CARDIOVERSION;  Surgeon: Buford Dresser, MD;  Location: [facility];  Service: Cardiovascular;  Laterality: N/A;   COLONOSCOPY  last one 08-07-2016   EYE SURGERY     FRAMELESS  BIOPSY WITH BRAINLAB Left 04/06/2020   Procedure: Left Stereotactic brain biopsy with brainlab;  Surgeon: Judith Part, MD;  Location: [facility];  Service: Neurosurgery;  Laterality: Left;   HEMORRHOID BANDING  10-20-2016;  09-11-2016   HERNIA REPAIR     KNEE ARTHROSCOPY Bilateral    10 + yrs ago   LASIK Bilateral    PILONIDAL CYST EXCISION     THYROIDECTOMY Left 2013   TRANSURETHRAL RESECTION OF PROSTATE N/A 01/04/2017   Procedure: TRANSURETHRAL RESECTION OF THE PROSTATE (TURP);  Surgeon: Franchot Gallo, MD;  Location: [facility];  Service: Urology;  Laterality: N/A;   UPPER GASTROINTESTINAL ENDOSCOPY  10/30/2008   WISDOM TOOTH EXTRACTION       Social History:  The patient  reports that he has quit smoking. He has never used smokeless tobacco. He reports current alcohol use. He reports that he does not use drugs.   Family History:  The patient's family history includes Heart attack in his mother; Other in his father and sister; Prostate cancer in his maternal grandfather.    ROS:  Please see the history of present illness. All other systems are reviewed and  Negative to the above problem except as noted.    PHYSICAL EXAM: VS:  Ht 5\' 11"  (1.803 m)   Wt 210 lb 12.8 oz (95.6 kg)   SpO2 96%   BMI 29.40 kg/m    BP laying 124/60   P 60  Sitting    140/64  P 66   Standing  138/62  P 68   Standing  (4 min)   124/60   P 772  GEN: Well nourished, well developed, in no acute distress  HEENT: normal  Neck: no JVD, no carotid bruits Cardiac: RRR; no murmurs, rubs, or  gallops,no LE edema  Respiratory:  clear  to auscultation bilaterally GI: soft, nontender, nondistended, + BS  No hepatomegaly  MS: no deformity Moving all extremities   Skin: warm and dry, no rash Neuro:  Deferred   Psych: euthymic mood, full affect   EKG:  EKG is not ordered today     Lipid Panel    Component Value Date/Time   CHOL 106 08/22/2019 1432   TRIG 118 08/22/2019 1432   HDL 38 (L) 08/22/2019 1432   CHOLHDL 2.8 08/22/2019 1432   LDLCALC 47 08/22/2019 1432      Wt Readings from Last 3 Encounters:  11/19/20 210 lb 12.8 oz (95.6 kg)  11/11/20 209 lb 9.6 oz (95.1 kg)  11/10/20 210 lb (95.3 kg)      ASSESSMENT AND PLAN:  1  Dizziness   Frequent spells   of dizziness  No syncope   The spells sound orthostatic in nature   I am not convinced related to his brain leasion I would recomm that he increase fluid and salt intake    I have also recomm that he switch amlodipine to 2.5 bid   Follow  The pt says when he is laying flat he does not have dizziness  Only with standing or when sitting with legs dangling    Recomm he keep feet moving   Use stationary pedals if in sitting position for long  Keep leg muscles toned.  2  Hx of CAD on CT    Pt without symptoms of angina   Follow     2  PAF Recurrent spell this spring   SYmptomatic    Keep on Xarelto as CHADSVASC is 3  3  HL  Continue Crestor Last lipids excellent   LDL 52  HDL 36    4  HTN   As noted above   Modifications     5  Neuro   L basal gangila lesion noted  Pt followed in neuro   [facility]    6  CV dz  Atherosclerosis on head CT   Current medicines are reviewed at length with the patient today.  The patient does not have concerns regarding medicines.  Signed, Dorris Carnes, MD  11/19/2020 1:43 PM    [facility] [place], [place], [place]  [address] Phone: [phone]; Fax: [phone]

## 2020-11-19 ENCOUNTER — Encounter: Payer: Self-pay | Admitting: Internal Medicine

## 2020-11-19 ENCOUNTER — Other Ambulatory Visit: Payer: Self-pay

## 2020-11-19 ENCOUNTER — Ambulatory Visit (INDEPENDENT_AMBULATORY_CARE_PROVIDER_SITE_OTHER): Payer: Medicare Other | Admitting: Internal Medicine

## 2020-11-19 VITALS — Ht 71.0 in | Wt 210.8 lb

## 2020-11-19 DIAGNOSIS — I2510 Atherosclerotic heart disease of native coronary artery without angina pectoris: Secondary | ICD-10-CM

## 2020-11-19 DIAGNOSIS — I480 Paroxysmal atrial fibrillation: Secondary | ICD-10-CM

## 2020-11-19 NOTE — Patient Instructions (Signed)
Medication Instructions:  Your physician has recommended you make the following change in your medication:   1.) change amlodipine 5 mg to HALF TAB (2.5 mg) TWICE DAILY  *If you need a refill on your cardiac medications before your next appointment, please call your pharmacy*   Lab Work: none   Testing/Procedures: none   Follow-Up: At [facility], you and your health needs are our priority.  As part of our continuing mission to provide you with exceptional heart care, we have created designated Provider Care Teams.  These Care Teams include your primary Cardiologist (physician) and Advanced Practice Providers (APPs -  Physician Assistants and Nurse Practitioners) who all work together to provide you with the care you need, when you need it.  Your next appointment:   6 month(s)  The format for your next appointment:   In Person  Provider:   You may see Dorris Carnes, MD or one of the following Advanced Practice Providers on your designated Care Team:   Richardson Dopp, PA-C [place], [place]   Other Instructions Increase fluids, especially in warmer weather and add salt to your diet

## 2020-11-22 ENCOUNTER — Inpatient Hospital Stay (HOSPITAL_BASED_OUTPATIENT_CLINIC_OR_DEPARTMENT_OTHER): Payer: Medicare Other | Admitting: Internal Medicine

## 2020-11-22 DIAGNOSIS — G939 Disorder of brain, unspecified: Secondary | ICD-10-CM

## 2020-11-22 DIAGNOSIS — J3089 Other allergic rhinitis: Secondary | ICD-10-CM | POA: Diagnosis not present

## 2020-11-22 DIAGNOSIS — J301 Allergic rhinitis due to pollen: Secondary | ICD-10-CM | POA: Diagnosis not present

## 2020-11-22 NOTE — Progress Notes (Signed)
I connected with Cory Mathis on 11/22/20 at 11:00 AM EDT by telephone visit and verified that I am speaking with the correct person using two identifiers.  I discussed the limitations, risks, security and privacy concerns of performing an evaluation and management service by telemedicine and the availability of in-person appointments. I also discussed with the patient that there may be a patient responsible charge related to this service. The patient expressed understanding and agreed to proceed.  Other persons participating in the visit and their role in the encounter:  spouse  Patient's location:  Home  Provider's location:  Office  Chief Complaint:  Lesion of left frontal lobe of brain  History of Present Ilness: Cory Mathis describes clear improvement in right sided weakness since starting the prednisone, in particular with the leg.  His thinking is also a "bit sharper" per his wife.  Otherwise no new or progressive changes, does feel a "little spacey" with the higher dose steroids at times.   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    Follow Up Instructions: RTC at end of July as scheduled following MRI brain and tumor board eval.  I discussed the assessment and treatment plan with the patient.  The patient was provided an opportunity to ask questions and all were answered.  The patient agreed with the plan and demonstrated understanding of the instructions.    The patient was advised to call back or seek an in-person evaluation if the symptoms worsen or if the condition fails to improve as anticipated.  I provided 5-10 minutes of non-face-to-face time during this enocunter.  [name], MD   I provided 15 minutes of non face-to-face telephone visit time during this encounter, and > 50% was spent counseling as  documented under my assessment & plan.

## 2020-11-29 DIAGNOSIS — J3089 Other allergic rhinitis: Secondary | ICD-10-CM | POA: Diagnosis not present

## 2020-11-29 DIAGNOSIS — J301 Allergic rhinitis due to pollen: Secondary | ICD-10-CM | POA: Diagnosis not present

## 2020-12-01 ENCOUNTER — Other Ambulatory Visit: Payer: Self-pay | Admitting: *Deleted

## 2020-12-06 DIAGNOSIS — J301 Allergic rhinitis due to pollen: Secondary | ICD-10-CM | POA: Diagnosis not present

## 2020-12-06 DIAGNOSIS — J3089 Other allergic rhinitis: Secondary | ICD-10-CM | POA: Diagnosis not present

## 2020-12-10 DIAGNOSIS — J301 Allergic rhinitis due to pollen: Secondary | ICD-10-CM | POA: Diagnosis not present

## 2020-12-10 DIAGNOSIS — J3089 Other allergic rhinitis: Secondary | ICD-10-CM | POA: Diagnosis not present

## 2020-12-12 DIAGNOSIS — Z20822 Contact with and (suspected) exposure to covid-19: Secondary | ICD-10-CM | POA: Diagnosis not present

## 2020-12-17 DIAGNOSIS — I4891 Unspecified atrial fibrillation: Secondary | ICD-10-CM | POA: Diagnosis not present

## 2020-12-17 DIAGNOSIS — J301 Allergic rhinitis due to pollen: Secondary | ICD-10-CM | POA: Diagnosis not present

## 2020-12-17 DIAGNOSIS — Z5181 Encounter for therapeutic drug level monitoring: Secondary | ICD-10-CM | POA: Diagnosis not present

## 2020-12-17 DIAGNOSIS — R29898 Other symptoms and signs involving the musculoskeletal system: Secondary | ICD-10-CM | POA: Diagnosis not present

## 2020-12-17 DIAGNOSIS — D496 Neoplasm of unspecified behavior of brain: Secondary | ICD-10-CM | POA: Diagnosis not present

## 2020-12-17 DIAGNOSIS — J3089 Other allergic rhinitis: Secondary | ICD-10-CM | POA: Diagnosis not present

## 2020-12-19 ENCOUNTER — Other Ambulatory Visit: Payer: Self-pay | Admitting: Internal Medicine

## 2020-12-19 DIAGNOSIS — I480 Paroxysmal atrial fibrillation: Secondary | ICD-10-CM

## 2020-12-20 DIAGNOSIS — J301 Allergic rhinitis due to pollen: Secondary | ICD-10-CM | POA: Diagnosis not present

## 2020-12-20 DIAGNOSIS — J3089 Other allergic rhinitis: Secondary | ICD-10-CM | POA: Diagnosis not present

## 2020-12-20 NOTE — Telephone Encounter (Signed)
Prescription refill request for Xarelto received.  Indication: A Fib Last office visit: 11/19/20 Weight: 210 Age: 70 Scr: 0.91 CrCl: 100 mL/min

## 2020-12-21 ENCOUNTER — Other Ambulatory Visit: Payer: Self-pay | Admitting: *Deleted

## 2020-12-21 MED ORDER — PREDNISONE 20 MG PO TABS: 20.0000 mg | ORAL_TABLET | Freq: Every day | ORAL | 5 refills | Status: DC

## 2020-12-23 ENCOUNTER — Ambulatory Visit (INDEPENDENT_AMBULATORY_CARE_PROVIDER_SITE_OTHER): Payer: Medicare Other | Admitting: Neurology

## 2020-12-23 ENCOUNTER — Encounter: Payer: Self-pay | Admitting: Neurology

## 2020-12-23 VITALS — BP 128/69 | HR 58 | Ht 70.0 in | Wt 209.0 lb

## 2020-12-23 DIAGNOSIS — Z7952 Long term (current) use of systemic steroids: Secondary | ICD-10-CM | POA: Diagnosis not present

## 2020-12-23 DIAGNOSIS — I2510 Atherosclerotic heart disease of native coronary artery without angina pectoris: Secondary | ICD-10-CM | POA: Diagnosis not present

## 2020-12-23 DIAGNOSIS — R9089 Other abnormal findings on diagnostic imaging of central nervous system: Secondary | ICD-10-CM | POA: Diagnosis not present

## 2020-12-23 DIAGNOSIS — G939 Disorder of brain, unspecified: Secondary | ICD-10-CM | POA: Diagnosis not present

## 2020-12-23 NOTE — Progress Notes (Signed)
Chief Complaint  Patient presents with   Follow-up    Rm 16 alone Pt is well and stable, been doing good since placed on prednison. No new or worsening symptoms       ASSESSMENT AND PLAN  Cory Mathis is a 70 y.o. male   Left  basal ganglia and adjacent white matter T2 hyperintensity  Brain biopsy showed hypercellular brain tissue, with no inflammatory infiltration, the inflammatory cells including CD3 positive T cells, CD20 positive B cells, and CD68 positive macrophages, there is no evidence of vasculitis of granulomatous inflammation, organisms are not identified, LDH high, R132H, toxoplasmosis, SV40 and HSV immunohistochemical stains are negative, the histology does not suggest a glial neuroblastoma,  Patient has temporary improvement clinical and imaging wise following steroid treatment, but most recent repeat MRI of the brain with without contrast on October 08, 2020, after stopping steroid, showed recurrent disease in and about left basal ganglion, does correlate with his reported worsening intermittent right-sided weakness,  He again showed significant symptoms improvement after restarting prednisone since June 2022, recurrent symptoms from prednisone dosage was decreased to 10 mg daily    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  He has an appointment with [facility] in September 2022,   Pending MRI brain w/wo on July 29th 2022 Reported episode of sudden onset of right leg weakness,  Previously would consider possible partial seizure, give him a short trial of Keppra, lamotrigine, could not tolerate it,  No evidence of epileptic medications,  Could possibly due to dysfunction from slight right hemiparesis,  DIAGNOSTIC DATA (LABS, IMAGING, TESTING) - I reviewed patient records, labs, notes, testing and imaging myself where available.  The biopsy shows  hypercellular brain tissue with a brisk perivascular  and parenchymal inflammatory infiltrate.  The inflammatory cells include  CD3 positive T cells, CD20 positive B cells and CD68 positive  macrophages.  There is no evidence of vasculitis or granulomatous  inflammation, and organisms are not identified.  IDH1 R132H,  toxoplasmosis, SV40 and HSV immunohistochemical stains are negative.  The differential diagnosis may include inflammatory or infectious  etiologies, demyelinating disease and others.  The histology does not  suggest a glial neoplasm, although due to clinical concern for  glioblastoma we are performing EGFR and TERT studies to further  evaluate.  If the lesion progresses or if there is concern for  potentially undersampled lesion consider repeat biopsy if clinically    INTERPRETATION SUMMARY:  EGFR does not exhibit amplification. The number of cells with a gain is less than 15%.   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 No interval evolution or specific finding to clarify the diagnosis.  HISTORICAL Cory Mathis is a 70 year old male, seen in refer by primary care doctor [name], for evaluation short-term memory loss, initial evaluation was on July 25, 2017.   I reviewed and summarized the referring note, he has past history of anxiety, hearing loss, goiter status post resection, hyperlipidemia, hypertension, he is a retired Financial trader from [employer], moved to [place] in April 2015.   He also reported a history of fever of unknown etiology, had 2 episodes 20 years apart, most recent episode was in 2014, fever up to 103, lasting for couple weeks, without clear etiology found, he was seen by different specialist, including rheumatologist,  ID specialist, symptoms eventually improved by steroid tapering dose, treatment lasted for a few  months.   Since 2015, he noticed mild memory loss, he can still keep very complicated spreadsheets without difficulty driving without loss, but in his daily activities, he let the water running for hours, sometimes forget why he goes to upstairs,   He also complains of chronic insomnia, gradually getting worse, was seen by sleep specialist Dr. Elenore Rota, was diagnosis of obstructive sleep apnea, using CPAP machine, also taking titrating dose of Ambien 12.5 mg every night, but sometimes it does not work.   UPDATE September 17 2017: He got fever again at the end of February, had extensive laboratory evaluation by his primary care doctor Via, was also seen by infectious disease Dr. Megan Salon, no etiology was found, he was treated with prednisone, whenever he taper off the prednisone, his fever come back at 4 PM, now is on 25 mg daily, continue complains of difficulty sleeping at nighttime, taking Ambien 12.5 mg daily We have personally reviewed MRI of the brain without contrast in February 2019 that was normal   Laboratory evaluation showed normal CBC CMP, TSH, fasting lipid profile, LDL was 86  Update September 28, 2020 He lost to follow-up since April 2019, on October 2021, he complains of increased headache, mental confusion, right arm weakness, CT head and later MRI of the brain with without contrast March 30, 2020 showed 4.5 cm infiltrating mass centered at the base of the brain on the left with involvement of the basal ganglion, nonradiating white matter tracts, mass-effect upon left lateral ventricle, no midline shift, which 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 Emelda Brothers on April 06, 2020, pathology reported hypercellular brain tissue with inflammatory infiltration,   The biopsy shows hypercellular brain tissue with a brisk perivascular  and parenchymal inflammatory infiltrate.  The inflammatory cells include  CD3 positive T  cells, CD20 positive B cells and CD68 positive  macrophages.  There is no evidence of vasculitis or granulomatous  inflammation, and organisms are not identified.  IDH1 R132H,  toxoplasmosis, SV40 and HSV immunohistochemical stains are negative.  The differential diagnosis may include inflammatory or infectious  etiologies, demyelinating disease and others.  The histology does not  suggest a glial neoplasm, although due to clinical concern for  glioblastoma we are performing EGFR and TERT studies to further  evaluate.  If the lesion progresses or if there is concern for  potentially undersampled lesion consider repeat biopsy if clinically  indicated EGFR does not exhibit amplification, the number of cells with again is less than 15%,  He was managed by Dr. 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  Patient complete Decadron 2 mg from November 18 to May 09, 2020, steroid free for few  days, then developed a rash on his face, and neck, did not improve with hydrocortisone cream, on May 17, 2020, he was given hydrocortisone 20 mg in the morning, 30 tablets,  He presented to emergency room on  June 19, 2020 for balance issues, lightheadedness, fall, worsening headache, he did not get second brain biopsy because significant improvement with steroid treatment continue MRI surveillance  Since February 2022, he also began to experience episode of sudden onset loss control of his leg, fell to the ground, less than 1 minute, Dr. Mickeal Skinner started Keppra 500 mg twice daily, patient only take it for 3 days, complains of dizziness, increased leg weakness, Keppra was stopped, he was given another trial of dexamethasone on July 19, 2020 4 mg daily for 1 week, reduce to 2 mg daily on July 27, 2020  At most recent follow-up with Dr. Mickeal Skinner August 09, 2020, patient and his wife describe ongoing deficit including short-term memory loss, occasional loss of balance, dropped things from his right hand, improved  headache control  MRI of brain with without contrast on June 19, 2020, slight interval increase in T2/flair signal abnormality involving left basal ganglia compared to previous MRI on April 30, 2020,  MRI of the brain with without contrast August 06, 2020, this is following her second round of Decadron treatment, again substantial decrease of T2 hyperdensity in the left basal ganglion and white matter, that correlate with steroid treatment  Extensive laboratory evaluation showed normal or negative  CMP, UDS, elevated WBC 11.7, normal hemoglobin 14.2, negative Lyme titer, C-reactive protein, ANA, TSH, LDL 47  UPDATE Oct 13 2020: Patient and his wife return to clinic to review repeat MRI on October 08, 2020: Which showed recurrent disease in and about the left basal ganglion, although not as extensive as symptom presentation, but certainly showed clear appearing recurrent disease compared to his most recent MRI scan in February, which is his past following his second dose of Decadron treatment  Patient also reported clinical worsening, he has increased episode of intermittent right arm and  leg weakness, his right leg would give out on him, each episode lasted about few seconds, no loss of consciousness, but wife reported that during the spell he looks spaced out,  He complains of mild irritation with Keppra 500 mg twice daily, also reported increased episode of transient right-sided weakness, he contributed to the side effect of Keppra, has stopped taking medicine,   He was contacted by [facility] Dr. Domingo Cocking, who has reviewed his recent MRI of the brain, suggested having not to follow-up with [place],  After extensive discussion with patient and his wife, he wants to proceed with further evaluation with his ongoing worsening clinical and MRI findings, I have suggested spinal fluid testing, he wants to hold off at this point, would rather go to [place] Dr. Elias Else for second opinion  UPDATE November 10 2020: Patient and his wife came urgently to the office without appointment, he was given prescription of lamotrigine 25 mg, supposed to titrating up lamotrigine to target dose of 100 mg twice a day, instead he never filled his lamotrigine 25 mg prescription, begin taking lamotrigine 100 mg daily for 1 week, then twice daily for 1 week, over the past few days, he has been taking 100 mg 3 times daily  His wife reported that over the past few weeks he has slow worsening functional status, more episode of confusion, right-sided weakness, unsteadiness, he has stopped lamotrigine yesterday after talking  with our office, lamotrigine level is pending today  Wife is very anxious, so is patient frustrated about his worsening functional status, repeat MRI of the brain in April 2022 clearly showed recurrent left basal ganglia lesion in comparison to February 2022, where he had improvement following steroid treatment, we again talked about potential further evaluation with lumbar puncture, he and his wife performed to have second opinion at [facility] neurology  UPDATE December 23 2020: Virtual visit with Dr .Mickeal Skinner on November 11 2020, start prednisone 38m daily, arranged to [facility] in Sept 2022,   repeating MRI brain w/wo planned for January 07 2021,  He had prednisone 480mx 10 day 2090m3 wees,  When dose was tapered down to 62m69mel terrible, began to have more recurrent episode of transient right-sided weakness, now back up to 20 mg daily, still has couple times each week, he described when he stands up from sitting position, he felt dizziness, unsteady, lose control of right leg, as if he is going to fall, he would stand there for few seconds, then he can continue        REVIEW OF SYSTEMS:  Full 14 system review of systems performed and notable only for as above All other review of systems were negative.  PHYSICAL EXAM:   Vitals:   12/23/20 1124  BP: 128/69  Pulse: (!) 58  Weight: 209 lb (94.8 kg)  Height: 5' 10"  (1.778 m)   Not recorded     Body mass index is 29.99 kg/m.  PHYSICAL EXAMNIATION:  Gen: NAD, conversant, well nourised, well groomed                     Cardiovascular: Regular rate rhythm, no peripheral edema, warm, nontender. Eyes: Conjunctivae clear without exudates or hemorrhage Neck: Supple, no carotid bruits. Pulmonary: Clear to auscultation bilaterally   NEUROLOGICAL EXAM:  MENTAL STATUS: Speech:   Montreal Cognitive Assessment  09/28/2020  Visuospatial/ Executive (0/5) 5  Naming (0/3) 3  Attention: Read list of digits (0/2) 2  Attention: Read list of letters (0/1) 1  Attention: Serial 7 subtraction starting at 100 (0/3) 3  Language: Repeat phrase (0/2) 2  Language : Fluency (0/1) 0  Abstraction (0/2) 2  Delayed Recall (0/5) 3  Orientation (0/6) 6  Total 27  Adjusted Score (based on education) 27     CRANIAL NERVES: CN II: Visual fields are full to confrontation. Pupils are round equal and briskly reactive to light. CN III, IV, VI: extraocular movement are normal. No ptosis. CN V: Facial sensation is  intact to light touch CN VII: Face is symmetric with normal eye closure  CN VIII: Hearing is normal to causal conversation. CN IX, X: Phonation is normal. CN XI: Head turning and shoulder shrug are intact  MOTOR: Fixation of right arm upon rapid rotating movement, mild right upper extremity proximal muscle weakness  REFLEXES: Hyperreflexia of right upper and lower extremity  SENSORY: Intact to light touch, pinprick and vibratory sensation are intact in fingers and toes.  COORDINATION: There is no trunk or limb dysmetria noted.  GAIT/STANCE: He can get up from seated position arm crossed, decreased right arm swing, dragging right leg slightly Romberg is absent.  ALLERGIES: Allergies  Allergen Reactions   Other     Mints - makes him sneeze   Sulfa Antibiotics Hives and Rash   Sulfasalazine Hives and Other (See Comments)    other   Metronidazole Other (See Comments)  Pt reported he was light headed for 2 weeks.      HOME MEDICATIONS: Current Outpatient Medications  Medication Sig Dispense Refill   amLODipine (NORVASC) 5 MG tablet Take 2.5 mg by mouth in the morning and at bedtime.     EPINEPHrine 0.3 mg/0.3 mL IJ SOAJ injection Inject 0.3 mg into the muscle as needed for anaphylaxis.     eszopiclone (LUNESTA) 2 MG TABS tablet Take 2 mg by mouth at bedtime.     predniSONE (DELTASONE) 20 MG tablet Take 1 tablet (20 mg total) by mouth daily with breakfast. 30 tablet 5   rivaroxaban (XARELTO) 20 MG TABS tablet TAKE 1 TABLET(20 MG) BY MOUTH DAILY WITH SUPPER 90 tablet 1   rosuvastatin (CRESTOR) 5 MG tablet TAKE 1/2 TABLET(2.5 MG) BY MOUTH DAILY 45 tablet 2   memantine (NAMENDA) 10 MG tablet Take 1 tablet (10 mg total) by mouth 2 (two) times daily. (Patient not taking: Reported on 12/23/2020) 60 tablet 11   No current facility-administered medications for this visit.    PAST MEDICAL HISTORY: Past Medical History:  Diagnosis Date   Allergic rhinitis    Atrial fibrillation  with rapid ventricular response (HCC)    Benign neoplasm of right choroid    BPH (benign prostatic hyperplasia)    Brain tumor (benign) (HCC)    Chest pain    Chronic headaches    Constipation    Diverticulosis of colon    Dysplastic nevus    Dysrhythmia 2019   Afib   Fatigue    Gastroesophageal reflux disease without esophagitis    GERD (gastroesophageal reflux disease)    prn med. 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   Hypertension    Hypothyroidism    Insomnia    Internal hemorrhoids    Left leg weakness    Memory loss    Nocturia    OSA on CPAP    CPAP nightly   Paresthesia of both feet    Retinal pigmentation    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[place])    Right thyroid nodule    Sinus bradycardia    Urinary retention 12/13/2016    PAST SURGICAL HISTORY: Past Surgical History:  Procedure Laterality Date   APPLICATION OF CRANIAL NAVIGATION N/A 04/06/2020   Procedure: APPLICATION OF CRANIAL NAVIGATION;  Surgeon: Judith Part, MD;  Location: [place];  Service: Neurosurgery;  Laterality: N/A;   BRAIN SURGERY     CARDIOVERSION N/A 12/23/2018   Procedure: CARDIOVERSION;  Surgeon: Buford Dresser, MD;  Location: [place];  Service: Cardiovascular;  Laterality: N/A;   COLONOSCOPY  last one 08-07-2016   EYE SURGERY     FRAMELESS  BIOPSY WITH BRAINLAB Left 04/06/2020   Procedure: Left Stereotactic brain biopsy with brainlab;  Surgeon: Judith Part, MD;  Location: [facility];  Service: Neurosurgery;  Laterality: Left;   HEMORRHOID BANDING   10-20-2016;  09-11-2016   HERNIA REPAIR     KNEE ARTHROSCOPY Bilateral    10 + yrs ago   LASIK Bilateral  PILONIDAL CYST EXCISION     THYROIDECTOMY Left 2013   TRANSURETHRAL RESECTION OF PROSTATE N/A 01/04/2017   Procedure: TRANSURETHRAL RESECTION OF THE PROSTATE (TURP);  Surgeon: Franchot Gallo, MD;  [facility];  Service: Urology;  Laterality: N/A;   UPPER GASTROINTESTINAL ENDOSCOPY  10/30/2008   WISDOM TOOTH EXTRACTION      FAMILY HISTORY: Family History  Problem Relation Age of Onset   Heart attack Mother        60's   Other Father        atherosclerosis - 61's   Prostate cancer Maternal Grandfather    Other Sister        brain tumor - unsure if it was cancer - 46's   Colon cancer Neg Hx    Esophageal cancer Neg Hx    Pancreatic cancer Neg Hx    Rectal cancer Neg Hx    Stomach cancer Neg Hx     SOCIAL HISTORY: Social History   Socioeconomic History   Marital status: Married    Spouse name: Not on file   Number of children: 0   Years of education: 16+   Highest education level: Bachelor's degree (e.g., BA, AB, BS)  Occupational History   Occupation: Retired  Tobacco Use   Smoking status: Former   Smokeless tobacco: Never  Scientific laboratory technician Use: Never used  Substance and Sexual Activity   Alcohol use: Yes    Comment: social    Drug use: No   Sexual activity: Not on file  Other Topics Concern   Not on file  Social History Narrative   Left-handed.   1.5 cups caffeine per day.   Lives at home with his wife.   Social Determinants of Health   Financial Resource Strain: Not on file  Food Insecurity: Not on file  Transportation Needs: Not on file  Physical Activity: Not on file  Stress: Not on file  Social Connections: Not on file  Intimate Partner Violence: Not on file   Marcial Pacas, M.D. Ph.D.  [facility] [address], [place], [place] [address] Ph: [phone] Fax: [phone]  CC:   Vernie Shanks, MD [place],  [place] [address]  Vernie Shanks, MD

## 2020-12-28 DIAGNOSIS — J3089 Other allergic rhinitis: Secondary | ICD-10-CM | POA: Diagnosis not present

## 2020-12-28 DIAGNOSIS — J301 Allergic rhinitis due to pollen: Secondary | ICD-10-CM | POA: Diagnosis not present

## 2021-01-04 DIAGNOSIS — J301 Allergic rhinitis due to pollen: Secondary | ICD-10-CM | POA: Diagnosis not present

## 2021-01-04 DIAGNOSIS — J3089 Other allergic rhinitis: Secondary | ICD-10-CM | POA: Diagnosis not present

## 2021-01-07 ENCOUNTER — Other Ambulatory Visit: Payer: Self-pay

## 2021-01-07 ENCOUNTER — Ambulatory Visit
Admission: RE | Admit: 2021-01-07 | Discharge: 2021-01-07 | Disposition: A | Discharge status: Home / Self Care | Payer: Medicare Other | Source: Ambulatory Visit | Attending: Internal Medicine | Admitting: Internal Medicine

## 2021-01-07 DIAGNOSIS — G939 Disorder of brain, unspecified: Secondary | ICD-10-CM | POA: Diagnosis not present

## 2021-01-07 DIAGNOSIS — R220 Localized swelling, mass and lump, head: Secondary | ICD-10-CM | POA: Diagnosis not present

## 2021-01-07 DIAGNOSIS — G319 Degenerative disease of nervous system, unspecified: Secondary | ICD-10-CM | POA: Diagnosis not present

## 2021-01-07 IMAGING — MR MR HEAD W/O CM
10 series · 48 of 48 positions shown · non-contrast
Comparison: Head CT [DATE]. Prior brain MRI examinations
[DATE] and earlier.

CLINICAL DATA: Lesion of left frontal lobe of brain [8K]
([8K]-CM). Brain/CNS neoplasm, surveillance.

EXAM:
MRI HEAD WITHOUT CONTRAST
TECHNIQUE: Multiplanar, multiecho pulse sequences of the brain and surrounding
structures were obtained without intravenous contrast.

[Series 2: T1 · sagittal · 5.0mm · 0.51mm/px · 2 of 23 slices shown]
[im 1/23]
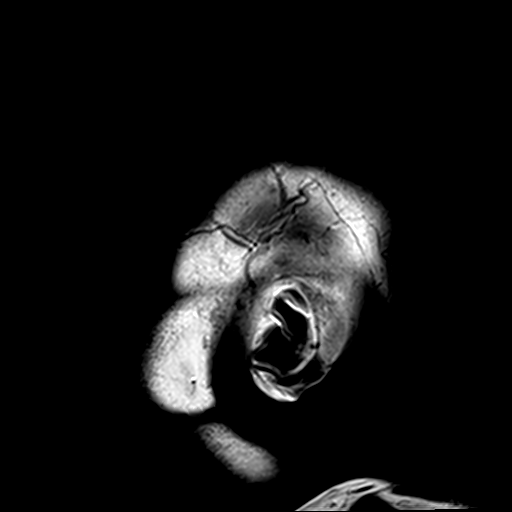
[im 23/23]
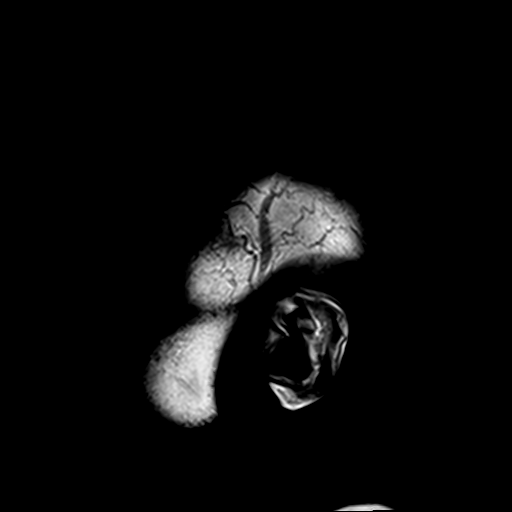

[Series 3: DWI · axial · 3.0mm · 2.03mm/px · z∈[-75,+87]mm · 8 of 110 slices shown (1 of 4)]
[im 1/110]
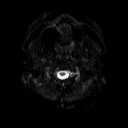
[im 16/110]
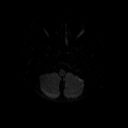
[im 32/110]
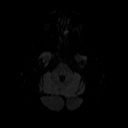
[im 47/110]
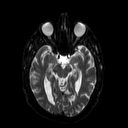
[im 63/110]
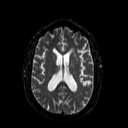
[im 78/110]
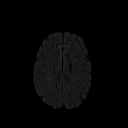
[im 94/110]
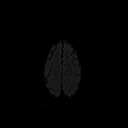
[im 110/110]
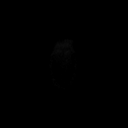

[Series 4: DWI · axial · 3.0mm · 2.03mm/px · z∈[-75,+87]mm · 4 of 53 slices shown (2 of 4)]
[im 1/53]
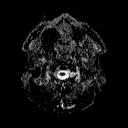
[im 18/53]
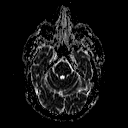
[im 35/53]
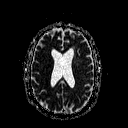
[im 53/53]
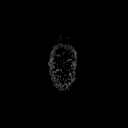

[Series 5: DWI · coronal · 5.0mm · 1.80mm/px · 6 of 78 slices shown (3 of 4)]
[im 1/78]
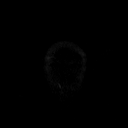
[im 16/78]
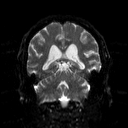
[im 31/78]
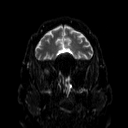
[im 47/78]
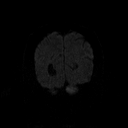
[im 62/78]
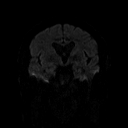
[im 78/78]
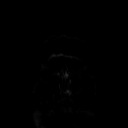

[Series 6: DWI · coronal · 5.0mm · 1.80mm/px · 3 of 39 slices shown (4 of 4)]
[im 1/39]
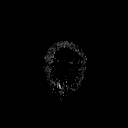
[im 20/39]
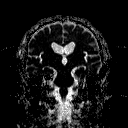
[im 39/39]
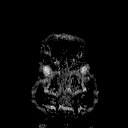

[Series 7: T2 · axial · 5.0mm · 0.81mm/px · z∈[-78,+90]mm · 2 of 25 slices shown (1 of 2)]
[im 1/25]
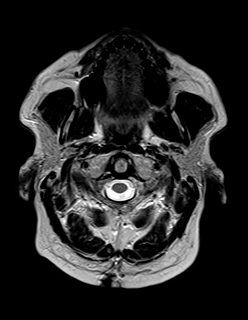
[im 25/25]
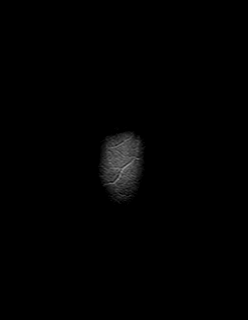

[Series 8: FLAIR · axial · 3.0mm · 0.51mm/px · z∈[-75,+87]mm · 3 of 36 slices shown]
[im 1/36]
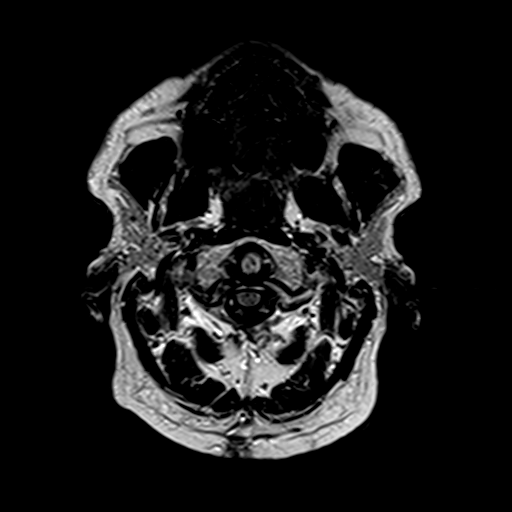
[im 18/36]
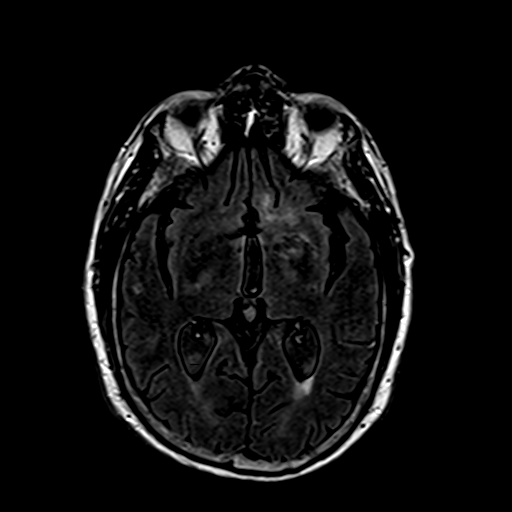
[im 36/36]
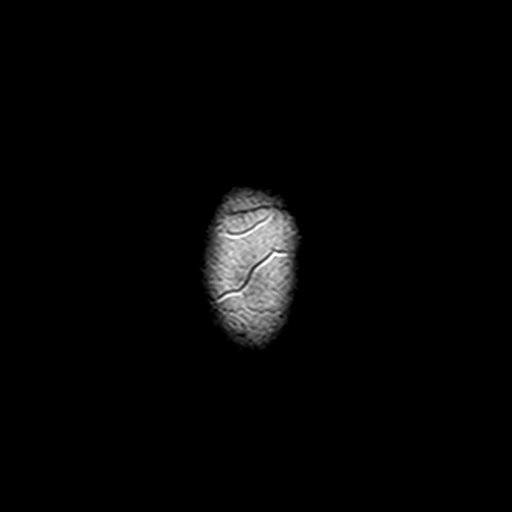

[Series 10: swi_images · axial · 4.0mm · 1.02mm/px · z∈[-80,+92]mm · 3 of 44 slices shown]
[im 1/44]
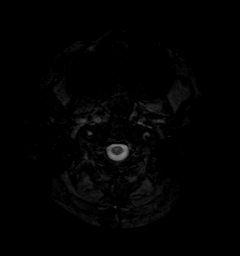
[im 22/44]
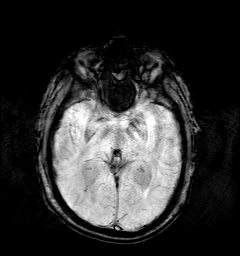
[im 44/44]
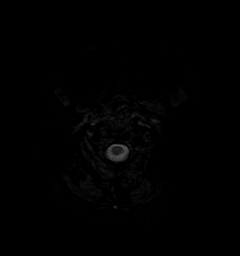

[Series 11: t1_mpr_tra · axial · 1.0mm · 1.02mm/px · z∈[-81,+94]mm · 14 of 176 slices shown]
[im 1/176]
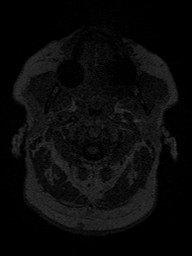
[im 14/176]
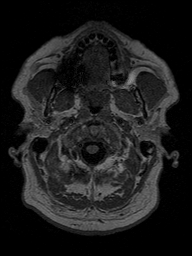
[im 27/176]
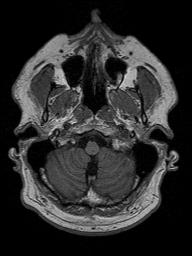
[im 41/176]
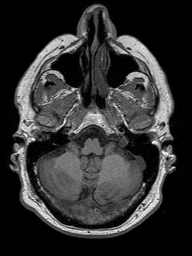
[im 54/176]
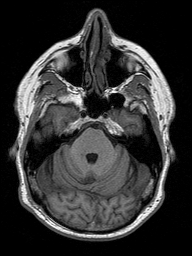
[im 68/176]
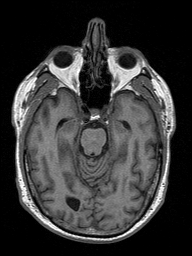
[im 81/176]
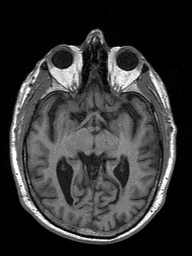
[im 95/176]
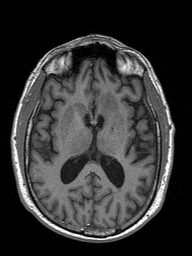
[im 108/176]
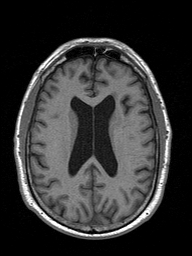
[im 122/176]
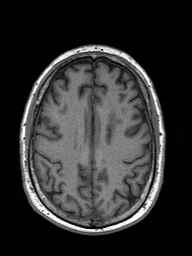
[im 135/176]
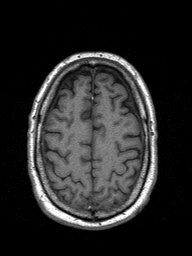
[im 149/176]
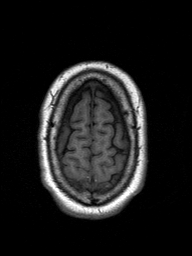
[im 162/176]
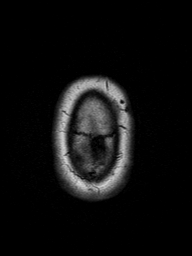
[im 176/176]
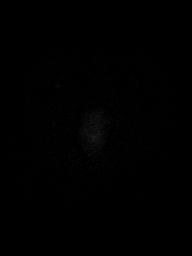

[Series 12: T2 · coronal · 5.0mm · 0.45mm/px · 3 of 39 slices shown (2 of 2)]
[im 1/39]
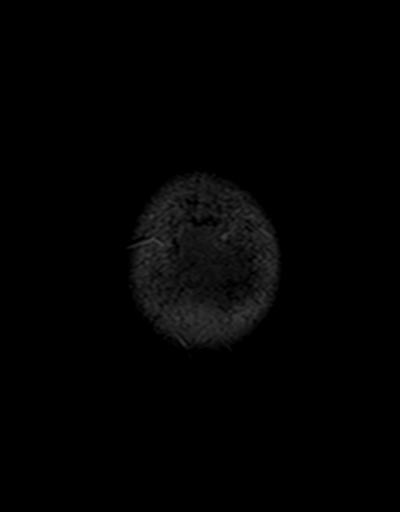
[im 20/39]
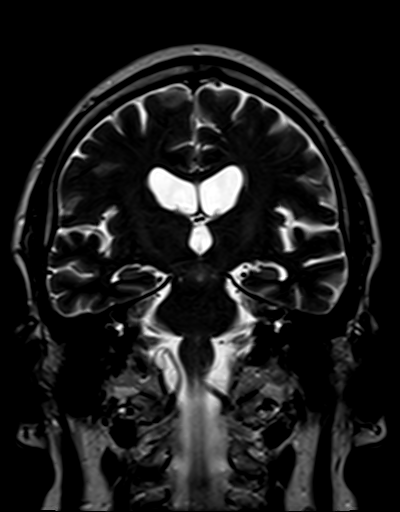
[im 39/39]
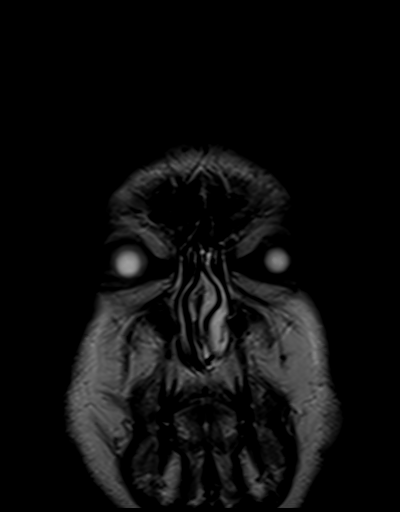

[48 of 48 positions shown; findings below may reference images not displayed]

FINDINGS: Brain:

Mild generalized cerebral and cerebellar atrophy.

T2/FLAIR hyperintense signal abnormality within and surrounding the
left basal ganglia and associated swelling appears similar to
slightly decreased as compared to the prior examination of
[DATE]. T2/FLAIR hyperintensity along the anterolateral margin
of the left lateral ventricle appears unchanged. Redemonstrated
adjacent biopsy tract with associated chronic hemosiderin deposition
traversing the left frontal lobe.

No new focus of parenchymal signal abnormality is identified.

There is no acute infarct.

No extra-axial fluid collection.

No midline shift.

Vascular: Expected proximal arterial flow voids.

Skull and upper cervical spine: No focal marrow lesion. Incompletely
assessed cervical spondylosis.

Sinuses/Orbits: Visualized orbits show no acute finding. Bilateral
lens replacements. Trace bilateral ethmoid sinus mucosal thickening.
IMPRESSION: Redemonstrated T2/FLAIR hyperintense signal abnormality within, and
surrounding, the left basal ganglia with associated swelling. These
findings are similar to slightly diminished as compared to the brain
MRI of [DATE]. Adjacent T2/FLAIR hyperintense signal abnormality
along the anterolateral margin of the left lateral ventricle appears
unchanged. No new focal parenchymal signal abnormality is
identified.

## 2021-01-10 ENCOUNTER — Inpatient Hospital Stay: Payer: Medicare Other

## 2021-01-10 ENCOUNTER — Inpatient Hospital Stay: Payer: Medicare Other | Attending: Internal Medicine | Admitting: Internal Medicine

## 2021-01-10 ENCOUNTER — Other Ambulatory Visit: Payer: Self-pay

## 2021-01-10 VITALS — BP 137/66 | HR 82 | Temp 98.7°F | Resp 18 | Ht 70.0 in | Wt 213.7 lb

## 2021-01-10 DIAGNOSIS — R531 Weakness: Secondary | ICD-10-CM | POA: Diagnosis not present

## 2021-01-10 DIAGNOSIS — Z87891 Personal history of nicotine dependence: Secondary | ICD-10-CM | POA: Diagnosis not present

## 2021-01-10 DIAGNOSIS — J3089 Other allergic rhinitis: Secondary | ICD-10-CM | POA: Diagnosis not present

## 2021-01-10 DIAGNOSIS — Z79899 Other long term (current) drug therapy: Secondary | ICD-10-CM | POA: Diagnosis not present

## 2021-01-10 DIAGNOSIS — G8190 Hemiplegia, unspecified affecting unspecified side: Secondary | ICD-10-CM | POA: Diagnosis not present

## 2021-01-10 DIAGNOSIS — Z8249 Family history of ischemic heart disease and other diseases of the circulatory system: Secondary | ICD-10-CM | POA: Insufficient documentation

## 2021-01-10 DIAGNOSIS — R9089 Other abnormal findings on diagnostic imaging of central nervous system: Secondary | ICD-10-CM | POA: Diagnosis not present

## 2021-01-10 DIAGNOSIS — G9389 Other specified disorders of brain: Secondary | ICD-10-CM | POA: Insufficient documentation

## 2021-01-10 DIAGNOSIS — Z808 Family history of malignant neoplasm of other organs or systems: Secondary | ICD-10-CM | POA: Diagnosis not present

## 2021-01-10 DIAGNOSIS — I480 Paroxysmal atrial fibrillation: Secondary | ICD-10-CM | POA: Insufficient documentation

## 2021-01-10 DIAGNOSIS — J301 Allergic rhinitis due to pollen: Secondary | ICD-10-CM | POA: Diagnosis not present

## 2021-01-10 DIAGNOSIS — Z8042 Family history of malignant neoplasm of prostate: Secondary | ICD-10-CM | POA: Diagnosis not present

## 2021-01-10 DIAGNOSIS — G939 Disorder of brain, unspecified: Secondary | ICD-10-CM | POA: Diagnosis not present

## 2021-01-10 DIAGNOSIS — E785 Hyperlipidemia, unspecified: Secondary | ICD-10-CM | POA: Diagnosis not present

## 2021-01-10 NOTE — Progress Notes (Signed)
[facility] [place], [place] [address] [phone]   Interval Evaluation  Date of Service: 01/10/21 Patient Name: Cory Mathis Patient MRN: [id number] Patient DOB: [birth year] Provider: [name], MD  Identifying Statement:  Cory Mathis is a 70 y.o. male with left frontal  mass    Oncologic History 04/06/20: Stereotactic biopsy by Dr. Zada Finders; path indeterminate  Biomarkers:  MGMT Unknown.  IDH 1/2 Unknown.  EGFR Unknown  TERT Unknown   Interval History:  Cory Mathis presents today after recent MRI brain for review.  Left sided weakness remains improved on the 31m dose of prednisone.  No longer requiring assistance for walking.  Continues to describe episodes of "right sided weakness, right side goes out ~10 seconds after I stand from seated position".  His full strength returns after 20-30 seconds.  Frequency is "one out of every 10 times I stand up".  He also describes some lightheaded feeling upon standing during these episodes, denies palpitations.  Headaches are not a problem lately.     H+P (04/16/20) Patient presented to medical attention in October 2021 with several weeks of daily headaches which were new.  Headaches are described as moderate pressure affecting bilateral temples, worse at night.  He also describes some modest impairment of short term memory, with some forgetting of names and dates.  No interference with day to day functioning.  He underwent brain MRI which demonstrated left frontal deep brain mass, which was biopsied on 04/06/20 by Dr. [name]  Since surgery he continues to have headaches but no other new or progressive changes.  Presents today for pathology review and treatment planning.    Medications: Current Outpatient Medications on File Prior to Visit  Medication Sig Dispense Refill   amLODipine (NORVASC) 5 MG tablet Take 2.5 mg by mouth in the morning and at bedtime.     EPINEPHrine  0.3 mg/0.3 mL IJ SOAJ injection Inject 0.3 mg into the muscle as needed for anaphylaxis.     eszopiclone (LUNESTA) 2 MG TABS tablet Take 2 mg by mouth at bedtime.     predniSONE (DELTASONE) 20 MG tablet Take 1 tablet (20 mg total) by mouth daily with breakfast. 30 tablet 5   rivaroxaban (XARELTO) 20 MG TABS tablet TAKE 1 TABLET(20 MG) BY MOUTH DAILY WITH SUPPER 90 tablet 1   rosuvastatin (CRESTOR) 5 MG tablet TAKE 1/2 TABLET(2.5 MG) BY MOUTH DAILY 45 tablet 2   No current facility-administered medications on file prior to visit.    Allergies:  Allergies  Allergen Reactions   Other     Mints - makes him sneeze   Sulfa Antibiotics Hives and Rash   Sulfasalazine Hives and Other (See Comments)    other   Metronidazole Other (See Comments)    Pt reported he was light headed for 2 weeks.     Past Medical History:  Past Medical History:  Diagnosis Date   Allergic rhinitis    Atrial fibrillation with rapid ventricular response (HCC)    Benign neoplasm of right choroid    BPH (benign prostatic hyperplasia)    Brain tumor (benign) (HCC)    Chest pain    Chronic headaches    Constipation    Diverticulosis of colon    Dysplastic nevus    Dysrhythmia 2019   Afib   Fatigue    Gastroesophageal reflux disease without esophagitis    GERD (gastroesophageal reflux disease)    prn med. 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   Hypertension    Hypothyroidism    Insomnia    Internal hemorrhoids    Left leg weakness    Memory loss    Nocturia    OSA on  CPAP    CPAP nightly   Paresthesia of both feet    Retinal pigmentation    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[place])    Right thyroid nodule    Sinus bradycardia    Urinary retention 12/13/2016   Past Surgical History:  Past Surgical History:  Procedure Laterality Date   APPLICATION OF CRANIAL NAVIGATION N/A 04/06/2020   Procedure: APPLICATION OF CRANIAL NAVIGATION;  Surgeon: Judith Part, MD;  Location: [facility];  Service: Neurosurgery;  Laterality: N/A;   BRAIN SURGERY     CARDIOVERSION N/A 12/23/2018   Procedure: CARDIOVERSION;  Surgeon: Buford Dresser, MD;  Location: [facility];  Service: Cardiovascular;  Laterality: N/A;   COLONOSCOPY  last one 08-07-2016   EYE SURGERY     FRAMELESS  BIOPSY WITH BRAINLAB Left 04/06/2020   Procedure: Left Stereotactic brain biopsy with brainlab;  Surgeon: Judith Part, MD;  Location: [facility];  Service: Neurosurgery;  Laterality: Left;   HEMORRHOID BANDING  10-20-2016;  09-11-2016   HERNIA REPAIR     KNEE ARTHROSCOPY Bilateral    10 + yrs ago   LASIK Bilateral    PILONIDAL CYST EXCISION     THYROIDECTOMY Left 2013   TRANSURETHRAL RESECTION OF PROSTATE N/A 01/04/2017   Procedure: TRANSURETHRAL RESECTION OF THE PROSTATE (TURP);  Surgeon: Franchot Gallo, MD;  Location: [facility];  Service: Urology;  Laterality: N/A;   UPPER GASTROINTESTINAL ENDOSCOPY  10/30/2008   WISDOM TOOTH EXTRACTION     Social History:  Social History   Socioeconomic History   Marital status: Married    Spouse name: Not on file   Number of children: 0   Years of education: 16+   Highest education level: Bachelor's degree (e.g., BA, AB, BS)  Occupational History   Occupation: Retired  Tobacco Use   Smoking status: Former   Smokeless tobacco: Never  Scientific laboratory technician Use: Never used  Substance and Sexual Activity   Alcohol use: Yes    Comment: social    Drug use: No   Sexual activity: Not on file  Other Topics Concern   Not on file   Social History Narrative   Left-handed.   1.5 cups caffeine per day.   Lives at home with his wife.   Social Determinants of Health   Financial Resource Strain: Not on file  Food Insecurity: Not on file  Transportation Needs: Not on file  Physical Activity: Not on file  Stress: Not on file  Social Connections: Not on file  Intimate Partner Violence: Not on file   Family History:  Family History  Problem Relation Age of Onset   Heart attack Mother        36's   Other Father        atherosclerosis -  79's   Prostate cancer Maternal Grandfather    Other Sister        brain tumor - unsure if it was cancer - 71's   Colon cancer Neg Hx    Esophageal cancer Neg Hx    Pancreatic cancer Neg Hx    Rectal cancer Neg Hx    Stomach cancer Neg Hx     Review of Systems: Constitutional: Doesn't report fevers, chills or abnormal weight loss Eyes: Doesn't report blurriness of vision Ears, nose, mouth, throat, and face: Doesn't report sore throat Respiratory: Doesn't report cough, dyspnea or wheezes Cardiovascular: Doesn't report palpitation, chest discomfort  Gastrointestinal:  Doesn't report nausea, constipation, diarrhea GU: Doesn't report incontinence Skin: Doesn't report skin rashes Neurological: Per HPI Musculoskeletal: Doesn't report joint pain Behavioral/Psych: Doesn't report anxiety  Physical Exam: Vitals:   01/10/21 1224  BP: 137/66  Pulse: 82  Resp: 18  Temp: 98.7 F (37.1 C)  SpO2: 100%   KPS: 70. General: Alert, cooperative, pleasant, in no acute distress Head: Normal EENT: No conjunctival injection or scleral icterus.  Lungs: Resp effort normal Cardiac: Regular rate Abdomen: Non-distended abdomen Skin: No rashes cyanosis or petechiae. Extremities: No clubbing or edema  Neurologic Exam: Mental Status: Awake, alert, attentive to examiner. Oriented to self and environment. Language is fluent with intact comprehension.  Cranial Nerves: Visual acuity is  grossly normal. Visual fields are full. Extra-ocular movements intact. No ptosis. Face is symmetric Motor: Tone and bulk are normal. Power is 4+/5 in right leg only. Reflexes are symmetric, no pathologic reflexes present.  Sensory: Intact to light touch Gait: Hemiparetic   Labs: I have reviewed the data as listed    Component Value Date/Time   NA 136 10/28/2020 1929   NA 139 08/22/2019 1432   K 3.7 10/28/2020 1929   CL 105 10/28/2020 1929   CO2 23 10/28/2020 1929   GLUCOSE 87 10/28/2020 1929   BUN 12 10/28/2020 1929   BUN 10 08/22/2019 1432   CREATININE 0.91 10/28/2020 1929   CREATININE 0.86 09/27/2017 0959   CALCIUM 9.4 10/28/2020 1929   PROT 7.4 10/28/2020 1929   ALBUMIN 4.1 10/28/2020 1929   AST 18 10/28/2020 1929   ALT 14 10/28/2020 1929   ALKPHOS 91 10/28/2020 1929   BILITOT 0.8 10/28/2020 1929   GFRNONAA >60 10/28/2020 1929   GFRAA 96 08/22/2019 1432   Lab Results  Component Value Date   WBC 9.3 10/28/2020   NEUTROABS 5.6 10/28/2020   HGB 13.3 10/28/2020   HCT 39.4 10/28/2020   MCV 95.2 10/28/2020   PLT 313 10/28/2020    Imaging:  [facility] Clinician Interpretation: I have personally reviewed the CNS images as listed.  My interpretation, in the context of the patient's clinical presentation, is stable disease  MR Brain Wo Contrast  Result Date: 01/08/2021 CLINICAL DATA:  Lesion of left frontal lobe of brain G93.9 (ICD-10-CM). Brain/CNS neoplasm, surveillance. EXAM: MRI HEAD WITHOUT CONTRAST TECHNIQUE: Multiplanar, multiecho pulse sequences of the brain and surrounding structures were obtained without intravenous contrast. COMPARISON:  Head CT 10/28/2020. Prior brain MRI examinations 10/08/2020 and earlier. FINDINGS: Brain: Mild generalized cerebral and cerebellar atrophy. T2/FLAIR hyperintense signal abnormality within and surrounding the left basal ganglia and associated swelling appears similar to slightly decreased as compared to the prior examination of 10/08/2020.  T2/FLAIR hyperintensity along the anterolateral margin of the left lateral ventricle appears unchanged. Redemonstrated adjacent biopsy tract with associated chronic hemosiderin deposition traversing the left frontal lobe. No new focus of parenchymal  signal abnormality is identified. There is no acute infarct. No extra-axial fluid collection. No midline shift. Vascular: Expected proximal arterial flow voids. Skull and upper cervical spine: No focal marrow lesion. Incompletely assessed cervical spondylosis. Sinuses/Orbits: Visualized orbits show no acute finding. Bilateral lens replacements. Trace bilateral ethmoid sinus mucosal thickening. IMPRESSION: Redemonstrated T2/FLAIR hyperintense signal abnormality within, and surrounding, the left basal ganglia with associated swelling. These findings are similar to slightly diminished as compared to the brain MRI of 10/08/2020. Adjacent T2/FLAIR hyperintense signal abnormality along the anterolateral margin of the left lateral ventricle appears unchanged. No new focal parenchymal signal abnormality is identified. Electronically Signed   By: Kellie Simmering DO   On: 01/08/2021 11:42     Assessment/Plan Weakness of right side of body - Plan: CT ANGIO HEAD NECK W WO CM  Lesion of left frontal lobe of brain  Abnormal brain MRI  Cory Mathis presents today with episodes of right sided weakness of unclear etiology.  They are clearly associated with position and are accompanied by light-headed feeling.  Although motor deficits correlate with MRI findings of recurring L frontal, capsular inflammatory infiltrate, the lesion is stable or contracted following chronic corticosteroid dosing.     We will recommend vascular workup with CT angio head/neck to identify any neurovascular stenosis or localized flow failure.    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  He is currently arranged to consult with [facility]  neuro-immunology in early September, which we agree is the appropriate course of action given his syndrome relapse.  We will give him a call once his CT angio is completed to review results and formulate treatment plan.  Will con't to follow with cardiology for paroxysmal afib.  All questions were answered. The patient knows to call the clinic with any problems, questions or concerns. No barriers to learning were detected.  The total time spent in the encounter was 40 minutes and more than 50% was on counseling and review of test results   [name] MD Medical Director of Neuro-Oncology [facility]08/01/22 12:55 PM

## 2021-01-11 DIAGNOSIS — G8191 Hemiplegia, unspecified affecting right dominant side: Secondary | ICD-10-CM | POA: Diagnosis not present

## 2021-01-11 DIAGNOSIS — R29898 Other symptoms and signs involving the musculoskeletal system: Secondary | ICD-10-CM | POA: Diagnosis not present

## 2021-01-11 DIAGNOSIS — R2681 Unsteadiness on feet: Secondary | ICD-10-CM | POA: Diagnosis not present

## 2021-01-11 DIAGNOSIS — D496 Neoplasm of unspecified behavior of brain: Secondary | ICD-10-CM | POA: Diagnosis not present

## 2021-01-11 DIAGNOSIS — R41841 Cognitive communication deficit: Secondary | ICD-10-CM | POA: Diagnosis not present

## 2021-01-12 ENCOUNTER — Encounter (HOSPITAL_COMMUNITY): Payer: Self-pay

## 2021-01-12 ENCOUNTER — Ambulatory Visit (HOSPITAL_COMMUNITY)
Admission: RE | Admit: 2021-01-12 | Discharge: 2021-01-12 | Disposition: A | Discharge status: Home / Self Care | Payer: Medicare Other | Source: Ambulatory Visit | Attending: Internal Medicine | Admitting: Internal Medicine

## 2021-01-12 ENCOUNTER — Other Ambulatory Visit: Payer: Self-pay

## 2021-01-12 DIAGNOSIS — I6523 Occlusion and stenosis of bilateral carotid arteries: Secondary | ICD-10-CM | POA: Diagnosis not present

## 2021-01-12 DIAGNOSIS — I6602 Occlusion and stenosis of left middle cerebral artery: Secondary | ICD-10-CM | POA: Diagnosis not present

## 2021-01-12 DIAGNOSIS — R531 Weakness: Secondary | ICD-10-CM | POA: Insufficient documentation

## 2021-01-12 DIAGNOSIS — I739 Peripheral vascular disease, unspecified: Secondary | ICD-10-CM | POA: Diagnosis not present

## 2021-01-12 DIAGNOSIS — R29818 Other symptoms and signs involving the nervous system: Secondary | ICD-10-CM | POA: Diagnosis not present

## 2021-01-12 DIAGNOSIS — Z20822 Contact with and (suspected) exposure to covid-19: Secondary | ICD-10-CM | POA: Diagnosis not present

## 2021-01-12 LAB — POCT I-STAT CREATININE: Creatinine, Ser: 0.8 mg/dL (ref 0.61–1.24)

## 2021-01-12 IMAGING — CT CT ANGIO HEAD-NECK (W OR W/O PERF)
1 of 11 series · 5 of 33 positions shown · IV contrast (omnipaque)
Comparison: CT head [DATE].  MRI head [DATE]

CLINICAL DATA: Acute neuro deficit.

History of needle biopsy left frontal lobe mass [DATE].
Indeterminate pathology.
EXAM:
CT ANGIOGRAPHY HEAD AND NECK
TECHNIQUE: Multidetector CT imaging of the head and neck was performed using
the standard protocol during bolus administration of intravenous
contrast. Multiplanar CT image reconstructions and MIPs were
obtained to evaluate the vascular anatomy. Carotid stenosis
measurements (when applicable) are obtained utilizing NASCET
criteria, using the distal internal carotid diameter as the
denominator.
CONTRAST:  75mL OMNIPAQUE IOHEXOL 350 MG/ML SOLN

[Series 11: ax thin · axial · 0.39mm/px · z∈[+1335,+1579]mm · 5 of 366 slices shown]
[im 61/366  soft-tissue]
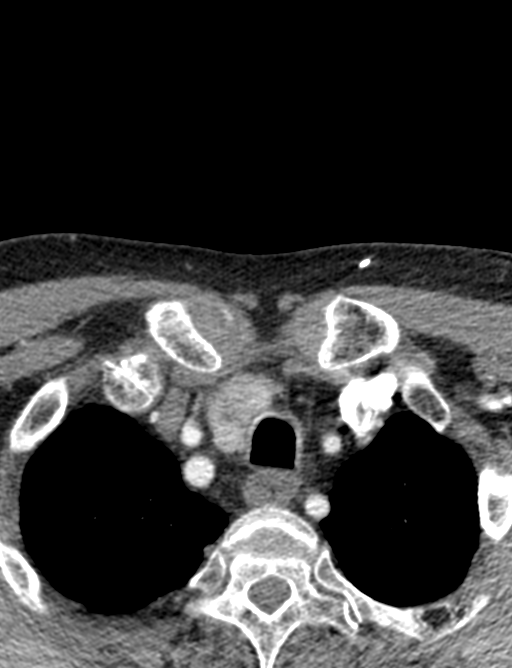
[im 122/366  bone]
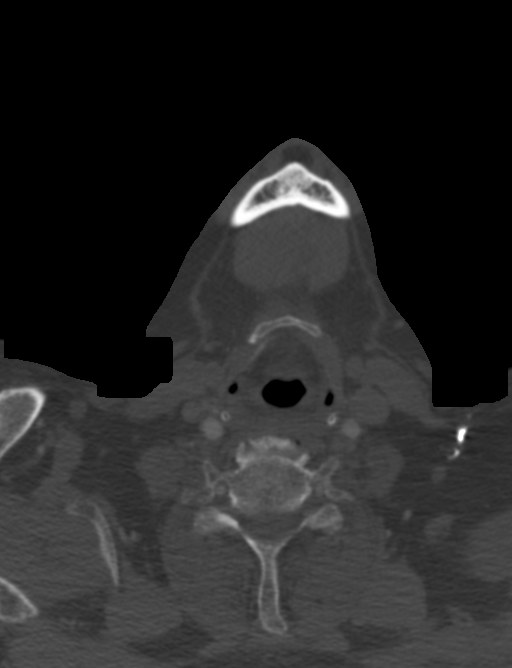
[im 183/366  soft-tissue]
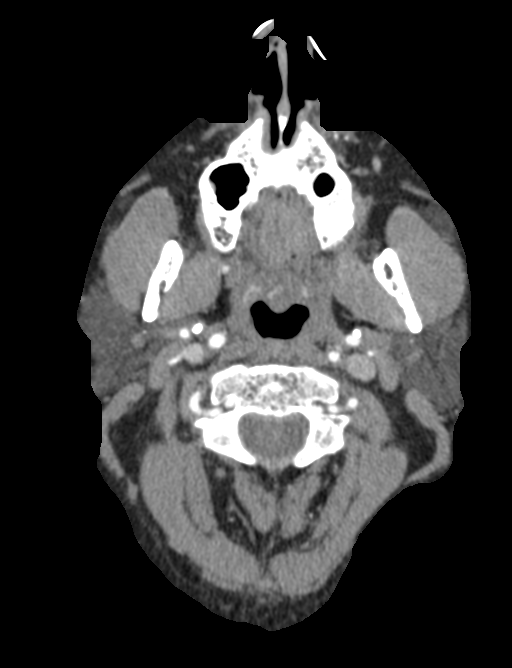
[im 244/366  bone]
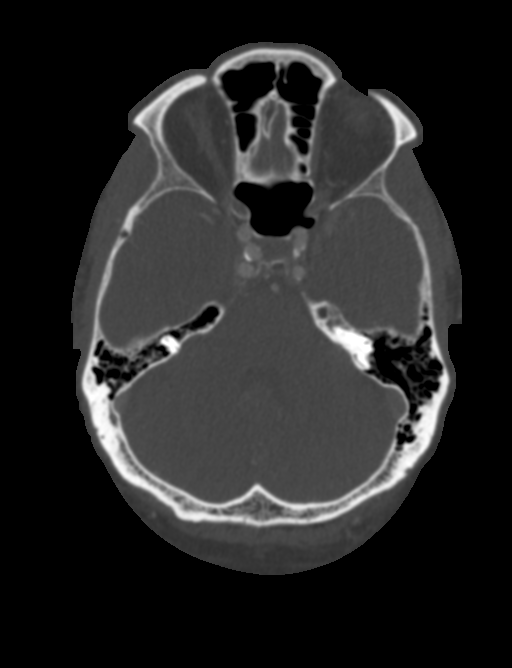
[im 305/366  soft-tissue]
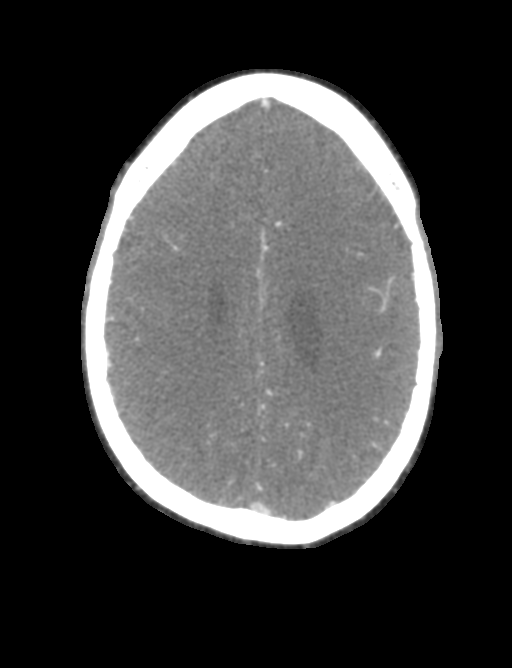

[5 of 33 positions shown; findings below may reference images not displayed]

FINDINGS: CT HEAD FINDINGS

Brain: Ill-defined hypodensity left inferior frontal lobe and left
basal ganglia similar to prior studies.

Ventricle size normal. No acute hemorrhage. Negative for acute
infarct.

Vascular: Negative for hyperdense vessel

Skull: Needle tract left frontal bone from prior biopsy.

Sinuses: Paranasal sinuses clear.

Orbits: Bilateral cataract extraction

Review of the MIP images confirms the above findings

CTA NECK FINDINGS

Aortic arch: Mild atherosclerotic calcification aortic arch.
Proximal great vessels widely patent. Bovine branching arch.

Right carotid system: Mild atherosclerotic disease right carotid
bifurcation without significant stenosis.

Left carotid system: Mild atherosclerotic calcification left carotid
bifurcation. No significant stenosis.

Vertebral arteries: Both vertebral arteries patent to the basilar.
Negative for stenosis.

Skeleton: Cervical spondylosis.  No acute skeletal abnormality.

Other neck: Left thyroidectomy. 14 mm right upper pole nodule,
similar to prior ultrasound exam. Imaging follow-up per prior
ultrasound. No enlarged lymph nodes in the neck.

Upper chest: Lung apices clear bilaterally.

Review of the MIP images confirms the above findings

CTA HEAD FINDINGS

Anterior circulation: Internal carotid artery widely patent through
the skull base. Atherosclerotic calcification in the cavernous
carotid bilaterally.

Severe long segment stenosis in the A2 segment bilaterally, left
greater than right. There is flow in distal anterior cerebral
arteries above the stenosis.

Severe stenosis of the supraclinoid internal carotid artery on the
left. Severe stenosis left M1 segment. Severe stenosis of inferior
division of the left middle cerebral artery.

Right middle cerebral artery appears widely patent.

Posterior circulation: Both vertebral arteries patent to the
basilar. PICA patent bilaterally. Basilar widely patent. Superior
cerebellar and posterior cerebral arteries widely patent. Fetal
origin of the posterior cerebral artery bilaterally.

Venous sinuses: Patent

Anatomic variants: None

Review of the MIP images confirms the above findings
IMPRESSION: 1. There is significant intracranial vasculopathy. There is severe
stenosis of the A2 segment bilaterally. There is severe stenosis of
the terminal left ICA and left M1 segment. These areas have
irregular long segment stenosis without calcification. Favor
vasculitis rather than atherosclerotic disease given the appearance
and usual location. In addition, this is in the area of abnormal
signal in the left basal ganglia which has been biopsied in the
past. Consider lupus vasculitis. Sarcoid also in the differential.
2. Mild atherosclerotic disease carotid bifurcation bilaterally
without significant stenosis.
3. These results were called by telephone at the time of
interpretation on [DATE] at [DATE] to provider MOGICA ,
who verbally acknowledged these results.

## 2021-01-12 MED ORDER — IOHEXOL 350 MG/ML SOLN
75.0000 mL | Freq: Once | INTRAVENOUS | Status: AC | PRN
  Administered 2021-01-12: 75 mL via INTRAVENOUS

## 2021-01-13 ENCOUNTER — Inpatient Hospital Stay (HOSPITAL_BASED_OUTPATIENT_CLINIC_OR_DEPARTMENT_OTHER): Payer: Medicare Other | Admitting: Internal Medicine

## 2021-01-13 DIAGNOSIS — D496 Neoplasm of unspecified behavior of brain: Secondary | ICD-10-CM | POA: Diagnosis not present

## 2021-01-13 DIAGNOSIS — R41841 Cognitive communication deficit: Secondary | ICD-10-CM | POA: Diagnosis not present

## 2021-01-13 DIAGNOSIS — I776 Arteritis, unspecified: Secondary | ICD-10-CM

## 2021-01-13 DIAGNOSIS — G8191 Hemiplegia, unspecified affecting right dominant side: Secondary | ICD-10-CM | POA: Diagnosis not present

## 2021-01-13 DIAGNOSIS — R2681 Unsteadiness on feet: Secondary | ICD-10-CM | POA: Diagnosis not present

## 2021-01-13 DIAGNOSIS — R29898 Other symptoms and signs involving the musculoskeletal system: Secondary | ICD-10-CM | POA: Diagnosis not present

## 2021-01-13 MED ORDER — PREDNISONE 20 MG PO TABS
40.0000 mg | ORAL_TABLET | Freq: Every day | ORAL | 0 refills | Status: DC
Start: 2021-01-13 — End: 2021-05-24

## 2021-01-13 NOTE — Progress Notes (Signed)
I connected with Cory Mathis on 01/13/21 at  2:30 PM EDT by telephone visit and verified that I am speaking with the correct person using two identifiers.  I discussed the limitations, risks, security and privacy concerns of performing an evaluation and management service by telemedicine and the availability of in-person appointments. I also discussed with the patient that there may be a patient responsible charge related to this service. The patient expressed understanding and agreed to proceed.  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  He has not had further "falling, right sided weakness" episodes since we spoke earlier this week.  No issues tolerating the contrast for CT angio.  Observations: Language and cognition at baseline  Imaging:  CT ANGIO HEAD NECK W WO CM  Result Date: 01/13/2021 CLINICAL DATA:  Acute neuro deficit. History of needle biopsy left frontal lobe mass 04/06/2020. Indeterminate pathology. EXAM: CT ANGIOGRAPHY HEAD AND NECK TECHNIQUE: Multidetector CT imaging of the head and neck was performed using the standard protocol during bolus administration of intravenous contrast. Multiplanar CT image reconstructions and MIPs were obtained to evaluate the vascular anatomy. Carotid stenosis measurements (when applicable) are obtained utilizing NASCET criteria, using the distal internal carotid diameter as the denominator. CONTRAST:  69m OMNIPAQUE IOHEXOL 350 MG/ML SOLN COMPARISON:  CT head 10/28/2020.  MRI head 01/07/2021 FINDINGS: CT HEAD FINDINGS Brain: Ill-defined hypodensity left inferior frontal lobe and left basal ganglia similar to prior studies. Ventricle size normal. No acute hemorrhage. Negative for acute infarct. Vascular: Negative for hyperdense  vessel Skull: Needle tract left frontal bone from prior biopsy. Sinuses: Paranasal sinuses clear. Orbits: Bilateral cataract extraction Review of the MIP images confirms the above findings CTA NECK FINDINGS Aortic arch: Mild atherosclerotic calcification aortic arch. Proximal great vessels widely patent. Bovine branching arch. Right carotid system: Mild atherosclerotic disease right carotid bifurcation without significant stenosis. Left carotid system: Mild atherosclerotic calcification left carotid bifurcation. No significant stenosis. Vertebral arteries: Both vertebral arteries patent to the basilar. Negative for stenosis. Skeleton: Cervical spondylosis.  No acute skeletal abnormality. Other neck: Left thyroidectomy. 14 mm right upper pole nodule, similar to prior ultrasound exam. Imaging follow-up per prior ultrasound. No enlarged lymph nodes in the neck. Upper chest: Lung apices clear bilaterally. Review of the MIP images confirms the above findings CTA HEAD FINDINGS Anterior circulation: Internal carotid artery widely patent through the skull base. Atherosclerotic calcification in the cavernous carotid bilaterally. Severe long segment stenosis in the A2 segment bilaterally, left greater than right. There is flow in distal anterior cerebral arteries above the stenosis. Severe stenosis of the supraclinoid internal carotid artery on the left. Severe stenosis left M1 segment. Severe stenosis of inferior division of the left middle cerebral artery. Right middle cerebral artery appears widely patent. Posterior circulation: Both vertebral arteries patent to the basilar. PICA patent bilaterally. Basilar widely patent. Superior cerebellar and posterior cerebral arteries widely patent. Fetal origin of the posterior cerebral artery bilaterally. Venous sinuses: Patent Anatomic variants: None Review of the MIP images confirms the above findings IMPRESSION: 1. 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 There is severe  stenosis of the A2 segment bilaterally. There is severe stenosis of the terminal left ICA and left M1 segment. These areas have irregular long segment stenosis without calcification. Favor vasculitis rather than  atherosclerotic disease given the appearance and usual location. In addition, this is in the area of abnormal signal in the left basal ganglia which has been biopsied in the past. 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 Sarcoid also in the differential. 2. Mild atherosclerotic disease carotid bifurcation bilaterally without significant stenosis. 3. These results were called by telephone at the time of interpretation on 01/13/2021 at 10:39 am to provider [facility] Honestii Marton , who verbally acknowledged these results. Electronically Signed   By: Franchot Gallo M.D.   On: 01/13/2021 10:42   MR Brain Wo Contrast  Result Date: 01/08/2021 CLINICAL DATA:  Lesion of left frontal lobe of brain G93.9 (ICD-10-CM). Brain/CNS neoplasm, surveillance. EXAM: MRI HEAD WITHOUT CONTRAST TECHNIQUE: Multiplanar, multiecho pulse sequences of the brain and surrounding structures were obtained without intravenous contrast. COMPARISON:  Head CT 10/28/2020. Prior brain MRI examinations 10/08/2020 and earlier. FINDINGS: Brain: Mild generalized cerebral and cerebellar atrophy. T2/FLAIR hyperintense signal abnormality within and surrounding the left basal ganglia and associated swelling appears similar to slightly decreased as compared to the prior examination of 10/08/2020. T2/FLAIR hyperintensity along the anterolateral margin of the left lateral ventricle appears unchanged. Redemonstrated adjacent biopsy tract with associated chronic hemosiderin deposition traversing the left frontal lobe. No new focus of parenchymal signal abnormality is identified. There is no acute infarct. No extra-axial fluid collection. No midline shift. Vascular: Expected proximal arterial flow voids. Skull and upper cervical spine: No focal marrow lesion. Incompletely  assessed cervical spondylosis. Sinuses/Orbits: Visualized orbits show no acute finding. Bilateral lens replacements. Trace bilateral ethmoid sinus mucosal thickening. IMPRESSION: Redemonstrated T2/FLAIR hyperintense signal abnormality within, and surrounding, the left basal ganglia with associated swelling. These findings are similar to slightly diminished as compared to the brain MRI of 10/08/2020. Adjacent T2/FLAIR hyperintense signal abnormality along the anterolateral margin of the left lateral ventricle appears unchanged. No new focal parenchymal signal abnormality is identified. 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    We recommended the following serologic workup:  ?Erythrocyte sedimentation rate (ESR) and C-reactive protein (CRP) ?Antinuclear antibodies (ANA) ?Antibodies to the Ro/SSA, La/SSB, Sm, and RNP antigens ?Antibodies to double-stranded deoxyribonucleic acid (DNA) ?Antiphospholipid antibodies (lupus anticoagulant [LA], IgG, and IgM anticardiolipin [aCL] antibodies; and IgG and IgM anti-beta2-glycoprotein [GP] I) ?Antineutrophil cytoplasmic antibodies (ANCA) ?Serum C3 and C4 ?Serum cryoglobulins ?Serum and urine protein electrophoresis with immune electrophoresis ?Quantitative immunoglobulin levels (IgG, IgM, IgA)  In addition, we will discuss utility of conventional cerebral angiogram with Dr. Kathyrn Sheriff, for better clarification of vasculitic pattern.  Due to imaging abnormality and ongoing symptoms (with greater relief at higher dose) recommended increasing prednisone to 21m daily x6 weeks.    Referral in place for [facility] neuroimmunology, appt scheduled for 02/16/21.  Consideration can be given for more advanced workup and treatment options.   Follow Up Instructions:  [facility] with ongoing symptoms in the meantime.  I discussed the assessment and treatment plan with the patient.  The patient was provided an opportunity to ask questions and all were answered.  The patient agreed with the plan and demonstrated understanding of the instructions.    The patient was advised to call back or seek an in-person evaluation if the symptoms worsen or if the condition fails to improve as anticipated.  I provided 5-10  minutes of non-face-to-face time during this enocunter.  [name], MD   I provided 21 minutes of non face-to-face telephone visit time during this encounter, and > 50% was spent counseling as documented under my assessment & plan.

## 2021-01-14 ENCOUNTER — Inpatient Hospital Stay: Payer: Medicare Other

## 2021-01-14 ENCOUNTER — Other Ambulatory Visit: Payer: Self-pay

## 2021-01-14 DIAGNOSIS — I480 Paroxysmal atrial fibrillation: Secondary | ICD-10-CM | POA: Diagnosis not present

## 2021-01-14 DIAGNOSIS — E785 Hyperlipidemia, unspecified: Secondary | ICD-10-CM | POA: Diagnosis not present

## 2021-01-14 DIAGNOSIS — G8190 Hemiplegia, unspecified affecting unspecified side: Secondary | ICD-10-CM | POA: Diagnosis not present

## 2021-01-14 DIAGNOSIS — R531 Weakness: Secondary | ICD-10-CM | POA: Diagnosis not present

## 2021-01-14 DIAGNOSIS — Z79899 Other long term (current) drug therapy: Secondary | ICD-10-CM | POA: Diagnosis not present

## 2021-01-14 DIAGNOSIS — G9389 Other specified disorders of brain: Secondary | ICD-10-CM | POA: Diagnosis not present

## 2021-01-14 DIAGNOSIS — I776 Arteritis, unspecified: Secondary | ICD-10-CM

## 2021-01-14 LAB — C-REACTIVE PROTEIN: CRP: 0.6 mg/dL (ref <1.0)

## 2021-01-14 LAB — SEDIMENTATION RATE: Sed Rate: 4 mm/hr (ref 0–16)

## 2021-01-15 LAB — C3 AND C4
C3 Complement: 122 mg/dL (ref 82–167)
Complement C4, Body Fluid: 19 mg/dL (ref 12–38)

## 2021-01-15 LAB — IGG, IGA, IGM
IgA: 315 mg/dL (ref 61–437)
IgG (Immunoglobin G), Serum: 1019 mg/dL (ref 603–1613)
IgM (Immunoglobulin M), Srm: 66 mg/dL (ref 15–143)

## 2021-01-17 DIAGNOSIS — J301 Allergic rhinitis due to pollen: Secondary | ICD-10-CM | POA: Diagnosis not present

## 2021-01-17 DIAGNOSIS — J3089 Other allergic rhinitis: Secondary | ICD-10-CM | POA: Diagnosis not present

## 2021-01-17 LAB — ANTI-DNA ANTIBODY, DOUBLE-STRANDED: ds DNA Ab: <1 IU/mL (ref 0–9)

## 2021-01-17 LAB — PROTEIN ELECTROPHORESIS, SERUM
A/G Ratio: 1.3 (ref 0.7–1.7)
Albumin ELP: 3.9 g/dL (ref 2.9–4.4)
Alpha-1-Globulin: 0.2 g/dL (ref 0.0–0.4)
Alpha-2-Globulin: 0.6 g/dL (ref 0.4–1.0)
Beta Globulin: 1.1 g/dL (ref 0.7–1.3)
Gamma Globulin: 1 g/dL (ref 0.4–1.8)
Globulin, Total: 2.9 g/dL (ref 2.2–3.9)
Total Protein ELP: 6.8 g/dL (ref 6.0–8.5)

## 2021-01-17 LAB — ANCA TITERS
Atypical P-ANCA titer: <1:20 {titer}
C-ANCA: <1:20 {titer}
P-ANCA: <1:20 {titer}

## 2021-01-19 LAB — ANTIPHOSPHOLIPID SYNDROME PROF
Anticardiolipin IgG: <9 GPL U/mL (ref 0–14)
Anticardiolipin IgM: 9 MPL U/mL (ref 0–12)
DRVVT: 58.9 s — ABNORMAL HIGH (ref 0.0–47.0)
PTT Lupus Anticoagulant: 41.3 s (ref 0.0–51.9)

## 2021-01-19 LAB — DRVVT MIX: dRVVT Mix: 47.1 s — ABNORMAL HIGH (ref 0.0–40.4)

## 2021-01-19 LAB — DRVVT CONFIRM: dRVVT Confirm: 1.3 ratio — ABNORMAL HIGH (ref 0.8–1.2)

## 2021-01-19 LAB — CRYOGLOBULIN

## 2021-01-20 LAB — ANTINUCLEAR ANTIBODIES, IFA: ANA Ab, IFA: NEGATIVE

## 2021-01-25 DIAGNOSIS — J301 Allergic rhinitis due to pollen: Secondary | ICD-10-CM | POA: Diagnosis not present

## 2021-01-25 DIAGNOSIS — J3089 Other allergic rhinitis: Secondary | ICD-10-CM | POA: Diagnosis not present

## 2021-01-31 ENCOUNTER — Telehealth: Payer: Self-pay | Admitting: Internal Medicine

## 2021-01-31 NOTE — Telephone Encounter (Signed)
Spoke with pt and advised we were still waiting on recommendations from Dr. Harrington Challenger.  I did schedule pt to see Oda Kilts, PA-C for evaluation on Wednesday.  Advised we would call back when Dr. Harrington Challenger gave recommendations. HR continues to read 60s-90s.

## 2021-01-31 NOTE — Telephone Encounter (Signed)
Spoke with pt who reports he has been in Afib since yesterday per his Apple Watch.  Pt denies current CP, SOB or dizziness.  Pt reports he is taking Xarelto  and other medications as prescribed.  Pt states he had to have a DCCV with his last episode. Pt advised will forward information to [name] for review and recommendation.  Reviewed ED precautions.  Pt verbalizes understanding and agrees with current plan.

## 2021-01-31 NOTE — Telephone Encounter (Signed)
Patient c/o Palpitations:  High priority if patient c/o lightheadedness, shortness of breath, or chest pain  How long have you had palpitations/irregular HR/ Afib? Are you having the symptoms now?  Patient states he went into afib last night and is still currently in afib   Are you currently experiencing lightheadedness, SOB or CP?  No   Do you have a history of afib (atrial fibrillation) or irregular heart rhythm?  Patient states he had another occurence 2 years ago   Have you checked your BP or HR? (document readings if available):  HR is fluctuating 01/30/21: 75-105 01/31/21: 68-88  Are you experiencing any other symptoms?  No

## 2021-01-31 NOTE — Telephone Encounter (Signed)
Patient called back to say that he hasnt heard from anybody since this morning and he needs to know what he needs to do. Please advise

## 2021-02-01 DIAGNOSIS — J301 Allergic rhinitis due to pollen: Secondary | ICD-10-CM | POA: Diagnosis not present

## 2021-02-01 DIAGNOSIS — J3089 Other allergic rhinitis: Secondary | ICD-10-CM | POA: Diagnosis not present

## 2021-02-01 NOTE — H&P (View-Only) (Signed)
PCP:  Vernie Shanks, MD Primary Cardiologist: Dorris Carnes, MD Electrophysiologist: Hilton Sinclair is a 70 y.o. male seen today for Dr. Harrington Challenger for acute visit due to recurrent atrial fibrillation . He is new to Electrophysiology.  The pt also has a hx of PAF  In  June 2020 he was placed on cardiazem and Xarelto   Underwent cardioversion  He was switched to metorpolol from cardiazem  Developed bradycardia and metorpolol was stopped, cardiazem restarted.    The pt was seen by neuro for deep L frontal mass on CT and MRI in 2021   Bx showed inflammation   Rx steroids   Developed seizures   On Keppra.     In April the pt had a CT scan  Had contrast reaction   Developed afib     Diltiazem increased to 30 mg q 6 hours     He was seen in cardiology in April  Asymptomatic   In June 2022 he was seen again in neuro   Described brief episodes of lightheadednss with standing   No syncope   He says it still is occurring   With this R side gets week   Denies palpitations   Is drinking some water   No CP . 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  Seen by Dr. Harrington Challenger 11/19/20. Doing well from cardiac standpoint. Working out every other day for 1.5 hours.  Patient reports being out of rhythm per his watch over the past 3 days.  He has no specific symptoms. He had similar episode in 12/2018 that required cardioversion.  He has no specific triggers. Doesn't use alcohol. Standard amount of caffeine daily. No recent illnesses that he is aware of. No specific stressor.  Past Medical History:  Diagnosis Date   Allergic rhinitis    Atrial fibrillation with rapid ventricular response (HCC)    Benign neoplasm of right choroid    BPH (benign prostatic hyperplasia)    Brain tumor (benign) (HCC)    Chest pain    Chronic headaches    Constipation    Diverticulosis of colon    Dysplastic nevus    Dysrhythmia 2019   Afib   Fatigue    Gastroesophageal reflux disease  without esophagitis    GERD (gastroesophageal reflux disease)    prn med. 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   Hypertension    Hypothyroidism    Insomnia    Internal hemorrhoids    Left leg weakness    Memory loss    Nocturia    OSA on CPAP    CPAP nightly   Paresthesia of both feet    Retinal pigmentation    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[place])    Right thyroid nodule    Sinus bradycardia    Urinary retention 12/13/2016   Past Surgical History:  Procedure Laterality Date   APPLICATION OF CRANIAL NAVIGATION N/A 04/06/2020   Procedure: APPLICATION OF CRANIAL NAVIGATION;  Surgeon: Judith Part, MD;  Location: [place];  Service: Neurosurgery;  Laterality: N/A;   BRAIN SURGERY     CARDIOVERSION N/A 12/23/2018   Procedure: CARDIOVERSION;  Surgeon: Buford Dresser, MD;  Location: [place];  Service: Cardiovascular;  Laterality: N/A;   COLONOSCOPY  last one 08-07-2016   EYE SURGERY     FRAMELESS  BIOPSY WITH BRAINLAB Left 04/06/2020   Procedure: Left Stereotactic brain biopsy with brainlab;  Surgeon: Judith Part, MD;  Location: [place];  Service: Neurosurgery;  Laterality: Left;   HEMORRHOID BANDING  10-20-2016;  09-11-2016   HERNIA REPAIR     KNEE ARTHROSCOPY Bilateral    10 + yrs ago   LASIK Bilateral    PILONIDAL CYST EXCISION     THYROIDECTOMY Left 2013   TRANSURETHRAL RESECTION OF PROSTATE N/A 01/04/2017   Procedure: TRANSURETHRAL RESECTION OF THE PROSTATE (TURP);  Surgeon: Franchot Gallo, MD;  Location: [facility];  Service: Urology;  Laterality: N/A;   UPPER GASTROINTESTINAL ENDOSCOPY  10/30/2008   WISDOM TOOTH EXTRACTION      Current Outpatient Medications  Medication Sig Dispense Refill   amLODipine (NORVASC) 5 MG tablet Take 2.5 mg by mouth in the morning and at bedtime.     diltiazem (CARDIZEM CD) 120 MG 24 hr capsule Take 1 capsule (120 mg total) by mouth daily. 90 capsule 3   EPINEPHrine 0.3 mg/0.3 mL IJ SOAJ injection Inject 0.3 mg into the muscle as needed for anaphylaxis.     eszopiclone (LUNESTA) 2 MG TABS tablet Take 2 mg by mouth at bedtime.     predniSONE (DELTASONE) 20 MG tablet Take 2 tablets (40 mg total) by mouth daily with breakfast. 120 tablet 0   rivaroxaban (XARELTO) 20 MG TABS tablet TAKE 1 TABLET(20 MG) BY MOUTH DAILY WITH SUPPER 90 tablet 1   rosuvastatin (CRESTOR) 5 MG tablet TAKE 1/2 TABLET(2.5 MG) BY MOUTH DAILY 45 tablet 2   No current facility-administered medications for this visit.    Allergies  Allergen Reactions   Other     Mints - makes him sneeze   Sulfa Antibiotics Hives and Rash   Sulfasalazine Hives and Other (See Comments)    other   Metronidazole Other (See Comments)    Pt reported he was light headed for 2 weeks.      Social History   Socioeconomic History   Marital status: Married    Spouse name: Not on file   Number of children: 0   Years of education: 16+   Highest education level: Bachelor's degree (e.g., BA, AB, BS)  Occupational History   Occupation: Retired  Tobacco Use   Smoking status: Former   Smokeless tobacco: Never  Scientific laboratory technician Use: Never used  Substance and Sexual Activity   Alcohol use: Yes    Comment: social    Drug use: No   Sexual activity: Not on file  Other Topics Concern   Not on file  Social History Narrative   Left-handed.   1.5 cups caffeine per day.   Lives at home with his wife.   Social Determinants of Health   Financial Resource Strain: Not on file  Food Insecurity: Not on file   Transportation Needs: Not on file  Physical Activity: Not on file  Stress: Not on file  Social Connections: Not on file  Intimate Partner Violence: Not on file    Review of Systems: All other systems reviewed and are otherwise negative except as  noted above.  Physical Exam: Vitals:   02/02/21 0836  BP: 124/80  Pulse: 96  SpO2: 98%  Weight: 216 lb (98 kg)  Height: 5' 10.5" (1.791 m)    GEN- The patient is well appearing, alert and oriented x 3 today.   HEENT: normocephalic, atraumatic; sclera clear, conjunctiva pink; hearing intact; oropharynx clear; neck supple, no JVP Lymph- no cervical lymphadenopathy Lungs- Clear to ausculation bilaterally, normal work of breathing.  No wheezes, rales, rhonchi Heart- Regular rate and rhythm, no murmurs, rubs or gallops, PMI not laterally displaced GI- soft, non-tender, non-distended, bowel sounds present, no hepatosplenomegaly Extremities- no clubbing, cyanosis, or edema; DP/PT/radial pulses 2+ bilaterally MS- no significant deformity or atrophy Skin- warm and dry, no rash or lesion Psych- euthymic mood, full affect Neuro- strength and sensation are intact  EKG is ordered. Personal review of EKG from today shows Atrial fibrillation at 96 bpm, personally reviewed  Additional studies reviewed include: Previous gen cards notes.   Assessment and Plan:  1. Persistent atrial fibrillation Will plan cardioversion next week Add diltiazem 120 mg daily. He has actually converted in the past with prn diltiazem.  Continue Xarelto for CHA2DS2/VASc of at least 3. He has not missed doses Update Echo to further clarify AAD options and to better assess candidacy for ablation He would prefer to avoid AAD for now if low dose diltiazem +/- cardioversion takes care of him.  He is willing to follow up in the AF clinic post [facility] or chemical conversion to further discuss medicine options.  He is willing to follow up with an EP MD in a couple of months to  discuss ablation given his overall desire to avoid medications.   2  Dizziness   Chronic component.  Does not happen when lying flat. Only when standing or when sitting with legs dangling.     3.  Hx of CAD on CT   No s/s ischemia     4. 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  The patient does not have concerns regarding medicines.  6. Sinus bradycardia Monitor closely once in sinus, may need to back off diltiazem to prn.  Plan [facility] with labs today. Add diltiazem. F/u AF clinic and EP MD to potentially discuss ablation.   Shirley Friar, PA-C  02/02/21 9:04 AM

## 2021-02-01 NOTE — Progress Notes (Signed)
PCP:  Vernie Shanks, MD Primary Cardiologist: Dorris Carnes, MD Electrophysiologist: Hilton Sinclair is a 70 y.o. male seen today for Dr. Harrington Challenger for acute visit due to recurrent atrial fibrillation . He is new to Electrophysiology.  The pt also has a hx of PAF  In  June 2020 he was placed on cardiazem and Xarelto   Underwent cardioversion  He was switched to metorpolol from cardiazem  Developed bradycardia and metorpolol was stopped, cardiazem restarted.    The pt was seen by neuro for deep L frontal mass on CT and MRI in 2021   Bx showed inflammation   Rx steroids   Developed seizures   On Keppra.     In April the pt had a CT scan  Had contrast reaction   Developed afib     Diltiazem increased to 30 mg q 6 hours     He was seen in cardiology in April  Asymptomatic   In June 2022 he was seen again in neuro   Described brief episodes of lightheadednss with standing   No syncope   He says it still is occurring   With this R side gets week   Denies palpitations   Is drinking some water   No CP . 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  Seen by Dr. Harrington Challenger 11/19/20. Doing well from cardiac standpoint. Working out every other day for 1.5 hours.  Patient reports being out of rhythm per his watch over the past 3 days.  He has no specific symptoms. He had similar episode in 12/2018 that required cardioversion.  He has no specific triggers. Doesn't use alcohol. Standard amount of caffeine daily. No recent illnesses that he is aware of. No specific stressor.  Past Medical History:  Diagnosis Date   Allergic rhinitis    Atrial fibrillation with rapid ventricular response (HCC)    Benign neoplasm of right choroid    BPH (benign prostatic hyperplasia)    Brain tumor (benign) (HCC)    Chest pain    Chronic headaches    Constipation    Diverticulosis of colon    Dysplastic nevus    Dysrhythmia 2019   Afib   Fatigue    Gastroesophageal reflux disease  without esophagitis    GERD (gastroesophageal reflux disease)    prn med. 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   Hypertension    Hypothyroidism    Insomnia    Internal hemorrhoids    Left leg weakness    Memory loss    Nocturia    OSA on CPAP    CPAP nightly   Paresthesia of both feet    Retinal pigmentation    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[place])    Right thyroid nodule    Sinus bradycardia    Urinary retention 12/13/2016   Past Surgical History:  Procedure Laterality Date   APPLICATION OF CRANIAL NAVIGATION N/A 04/06/2020   Procedure: APPLICATION OF CRANIAL NAVIGATION;  Surgeon: Judith Part, MD;  Location: [place];  Service: Neurosurgery;  Laterality: N/A;   BRAIN SURGERY     CARDIOVERSION N/A 12/23/2018   Procedure: CARDIOVERSION;  Surgeon: Buford Dresser, MD;  Location: [place];  Service: Cardiovascular;  Laterality: N/A;   COLONOSCOPY  last one 08-07-2016   EYE SURGERY     FRAMELESS  BIOPSY WITH BRAINLAB Left 04/06/2020   Procedure: Left Stereotactic brain biopsy with brainlab;  Surgeon: Judith Part, MD;  Location: [facility];  Service: Neurosurgery;  Laterality: Left;   HEMORRHOID BANDING  10-20-2016;  09-11-2016   HERNIA REPAIR     KNEE ARTHROSCOPY Bilateral    10 + yrs ago   LASIK Bilateral    PILONIDAL CYST EXCISION     THYROIDECTOMY Left 2013   TRANSURETHRAL RESECTION OF PROSTATE N/A 01/04/2017   Procedure: TRANSURETHRAL RESECTION OF THE PROSTATE (TURP);  Surgeon: Franchot Gallo, MD;  Location: [facility];  Service: Urology;  Laterality: N/A;   UPPER GASTROINTESTINAL ENDOSCOPY  10/30/2008   WISDOM TOOTH EXTRACTION      Current Outpatient Medications  Medication Sig Dispense Refill   amLODipine (NORVASC) 5 MG tablet Take 2.5 mg by mouth in the morning and at bedtime.     diltiazem (CARDIZEM CD) 120 MG 24 hr capsule Take 1 capsule (120 mg total) by mouth daily. 90 capsule 3   EPINEPHrine 0.3 mg/0.3 mL IJ SOAJ injection Inject 0.3 mg into the muscle as needed for anaphylaxis.     eszopiclone (LUNESTA) 2 MG TABS tablet Take 2 mg by mouth at bedtime.     predniSONE (DELTASONE) 20 MG tablet Take 2 tablets (40 mg total) by mouth daily with breakfast. 120 tablet 0   rivaroxaban (XARELTO) 20 MG TABS tablet TAKE 1 TABLET(20 MG) BY MOUTH DAILY WITH SUPPER 90 tablet 1   rosuvastatin (CRESTOR) 5 MG tablet TAKE 1/2 TABLET(2.5 MG) BY MOUTH DAILY 45 tablet 2   No current facility-administered medications for this visit.    Allergies  Allergen Reactions   Other     Mints - makes him sneeze   Sulfa Antibiotics Hives and Rash   Sulfasalazine Hives and Other (See Comments)    other   Metronidazole Other (See Comments)    Pt reported he was light headed for 2 weeks.      Social History   Socioeconomic History   Marital status: Married    Spouse name: Not on file   Number of children: 0   Years of education: 16+   Highest education level: Bachelor's degree (e.g., BA, AB, BS)  Occupational History   Occupation: Retired  Tobacco Use   Smoking status: Former   Smokeless tobacco: Never  Scientific laboratory technician Use: Never used  Substance and Sexual Activity   Alcohol use: Yes    Comment: social    Drug use: No   Sexual activity: Not on file  Other Topics Concern   Not on file  Social History Narrative   Left-handed.   1.5 cups caffeine per day.   Lives at home with his wife.   Social Determinants of Health   Financial Resource Strain: Not on file  Food Insecurity: Not on file   Transportation Needs: Not on file  Physical Activity: Not on file  Stress: Not on file  Social Connections: Not on file  Intimate Partner Violence: Not on file    Review of Systems: All other systems reviewed and are otherwise negative except as  noted above.  Physical Exam: Vitals:   02/02/21 0836  BP: 124/80  Pulse: 96  SpO2: 98%  Weight: 216 lb (98 kg)  Height: 5' 10.5" (1.791 m)    GEN- The patient is well appearing, alert and oriented x 3 today.   HEENT: normocephalic, atraumatic; sclera clear, conjunctiva pink; hearing intact; oropharynx clear; neck supple, no JVP Lymph- no cervical lymphadenopathy Lungs- Clear to ausculation bilaterally, normal work of breathing.  No wheezes, rales, rhonchi Heart- Regular rate and rhythm, no murmurs, rubs or gallops, PMI not laterally displaced GI- soft, non-tender, non-distended, bowel sounds present, no hepatosplenomegaly Extremities- no clubbing, cyanosis, or edema; DP/PT/radial pulses 2+ bilaterally MS- no significant deformity or atrophy Skin- warm and dry, no rash or lesion Psych- euthymic mood, full affect Neuro- strength and sensation are intact  EKG is ordered. Personal review of EKG from today shows Atrial fibrillation at 96 bpm, personally reviewed  Additional studies reviewed include: Previous gen cards notes.   Assessment and Plan:  1. Persistent atrial fibrillation Will plan cardioversion next week Add diltiazem 120 mg daily. He has actually converted in the past with prn diltiazem.  Continue Xarelto for CHA2DS2/VASc of at least 3. He has not missed doses Update Echo to further clarify AAD options and to better assess candidacy for ablation He would prefer to avoid AAD for now if low dose diltiazem +/- cardioversion takes care of him.  He is willing to follow up in the AF clinic post [facility] or chemical conversion to further discuss medicine options.  He is willing to follow up with an EP MD in a couple of months to  discuss ablation given his overall desire to avoid medications.   2  Dizziness   Chronic component.  Does not happen when lying flat. Only when standing or when sitting with legs dangling.     3.  Hx of CAD on CT   No s/s ischemia     4. 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  The patient does not have concerns regarding medicines.  6. Sinus bradycardia Monitor closely once in sinus, may need to back off diltiazem to prn.  Plan [facility] with labs today. Add diltiazem. F/u AF clinic and EP MD to potentially discuss ablation.   Shirley Friar, PA-C  02/02/21 9:04 AM

## 2021-02-02 ENCOUNTER — Encounter: Payer: Self-pay | Admitting: Internal Medicine

## 2021-02-02 ENCOUNTER — Other Ambulatory Visit: Payer: Self-pay

## 2021-02-02 ENCOUNTER — Ambulatory Visit (INDEPENDENT_AMBULATORY_CARE_PROVIDER_SITE_OTHER): Payer: Medicare Other | Admitting: Student

## 2021-02-02 VITALS — BP 124/80 | HR 96 | Ht 70.5 in | Wt 216.0 lb

## 2021-02-02 DIAGNOSIS — I2510 Atherosclerotic heart disease of native coronary artery without angina pectoris: Secondary | ICD-10-CM

## 2021-02-02 DIAGNOSIS — I10 Essential (primary) hypertension: Secondary | ICD-10-CM | POA: Diagnosis not present

## 2021-02-02 DIAGNOSIS — R002 Palpitations: Secondary | ICD-10-CM

## 2021-02-02 DIAGNOSIS — Z7901 Long term (current) use of anticoagulants: Secondary | ICD-10-CM

## 2021-02-02 DIAGNOSIS — I480 Paroxysmal atrial fibrillation: Secondary | ICD-10-CM

## 2021-02-02 LAB — COMPREHENSIVE METABOLIC PANEL
ALT: 35 IU/L (ref 0–44)
AST: 20 IU/L (ref 0–40)
Albumin/Globulin Ratio: 2 (ref 1.2–2.2)
Albumin: 4.4 g/dL (ref 3.7–4.7)
Alkaline Phosphatase: 75 IU/L (ref 44–121)
BUN/Creatinine Ratio: 16 (ref 10–24)
BUN: 13 mg/dL (ref 8–27)
Bilirubin Total: 1.1 mg/dL (ref 0.0–1.2)
CO2: 25 mmol/L (ref 20–29)
Calcium: 9.6 mg/dL (ref 8.6–10.2)
Chloride: 103 mmol/L (ref 96–106)
Creatinine, Ser: 0.8 mg/dL (ref 0.76–1.27)
Globulin, Total: 2.2 g/dL (ref 1.5–4.5)
Glucose: 86 mg/dL (ref 65–99)
Potassium: 4.1 mmol/L (ref 3.5–5.2)
Sodium: 140 mmol/L (ref 134–144)
Total Protein: 6.6 g/dL (ref 6.0–8.5)
eGFR: 95 mL/min/{1.73_m2} (ref >59)

## 2021-02-02 LAB — CBC
Hematocrit: 46.8 % (ref 37.5–51.0)
Hemoglobin: 16.3 g/dL (ref 13.0–17.7)
MCH: 32.7 pg (ref 26.6–33.0)
MCHC: 34.8 g/dL (ref 31.5–35.7)
MCV: 94 fL (ref 79–97)
Platelets: 270 10*3/uL (ref 150–450)
RBC: 4.99 x10E6/uL (ref 4.14–5.80)
RDW: 12.9 % (ref 11.6–15.4)
WBC: 12.7 10*3/uL — ABNORMAL HIGH (ref 3.4–10.8)

## 2021-02-02 MED ORDER — DILTIAZEM HCL ER COATED BEADS 120 MG PO CP24: 120.0000 mg | ORAL_CAPSULE | Freq: Every day | ORAL | 3 refills | Status: DC

## 2021-02-02 NOTE — Patient Instructions (Signed)
Medication Instructions:  Your physician has recommended you make the following change in your medication:   START: Diltiazem '120mg'$  daily  *If you need a refill on your cardiac medications before your next appointment, please call your pharmacy*   Lab Work: TODAY: CMET, CBC  If you have labs (blood work) drawn today and your tests are completely normal, you will receive your results only by: [place] (if you have MyChart) OR A paper copy in the mail If you have any lab test that is abnormal or we need to change your treatment, we will call you to review the results.   Testing/Procedures: Your physician has requested that you have an echocardiogram. Echocardiography is a painless test that uses sound waves to create images of your heart. It provides your doctor with information about the size and shape of your heart and how well your heart's chambers and valves are working. This procedure takes approximately one hour. There are no restrictions for this procedure.   Follow-Up: At [facility], you and your health needs are our priority.  As part of our continuing mission to provide you with exceptional heart care, we have created designated Provider Care Teams.  These Care Teams include your primary Cardiologist (physician) and Advanced Practice Providers (APPs -  Physician Assistants and Nurse Practitioners) who all work together to provide you with the care you need, when you need it.   Your next appointment:   As scheduled  Other Instructions  You are scheduled for a Cardioversion on 02/08/2021 with Dr. Harrington Challenger.  Please arrive at the [facility] (Main Entrance A) at [facility]: [address] [place], [place] [address] at 8:00am.   DIET: Nothing to eat or drink after midnight except a sip of water with medications (see medication instructions below)  FYI: For your safety, and to allow [place] to monitor your vital signs accurately during the surgery/procedure we request  that   if you have artificial nails, gel coating, SNS etc. Please have those removed prior to your surgery/procedure. Not having the nail coverings /polish removed may result in cancellation or delay of your surgery/procedure.   Medication Instructions:  Continue your anticoagulant: Xarelto You will need to continue your anticoagulant after your procedure until you  are told by your  Provider that it is safe to stop   Labs: Today You must have a responsible person to drive you home and stay in the waiting area during your procedure. Failure to do so could result in cancellation.  Bring your insurance cards.  *Special Note: Every effort is made to have your procedure done on time. Occasionally there are emergencies that occur at the hospital that may cause delays. Please be patient if a delay does occur.     _________________________________________________________________________  AFIB CLINIC INFORMATION: Your appointment is scheduled on: 02/15/2021 /at 8:30am. Please arrive 15 minutes early for check-in. The AFib Clinic is located in the Heart and Vascular Specialty Clinics at [facility]. Parking instructions/directions: [address] (off [address]). When you pull in to Entrance C, there is an underground parking garage to your right. The code to enter the garage is 5544. Take the elevators to the first floor. Follow the signs to the Heart and Vascular Specialty Clinics. You will see registration at the end of the hallway.  Phone number: [phone]

## 2021-02-02 NOTE — Telephone Encounter (Signed)
error 

## 2021-02-07 DIAGNOSIS — J3089 Other allergic rhinitis: Secondary | ICD-10-CM | POA: Diagnosis not present

## 2021-02-07 DIAGNOSIS — J301 Allergic rhinitis due to pollen: Secondary | ICD-10-CM | POA: Diagnosis not present

## 2021-02-08 ENCOUNTER — Encounter (HOSPITAL_COMMUNITY)
Admission: RE | Disposition: A | Discharge status: Home / Self Care | Payer: Self-pay | Source: Home / Self Care | Attending: Internal Medicine

## 2021-02-08 ENCOUNTER — Encounter (HOSPITAL_COMMUNITY): Payer: Self-pay | Admitting: Internal Medicine

## 2021-02-08 ENCOUNTER — Ambulatory Visit (HOSPITAL_COMMUNITY): Payer: Medicare Other | Admitting: Anesthesiology

## 2021-02-08 ENCOUNTER — Ambulatory Visit (HOSPITAL_COMMUNITY)
Admission: RE | Admit: 2021-02-08 | Discharge: 2021-02-08 | Disposition: A | Discharge status: Home / Self Care | Payer: Medicare Other | Attending: Internal Medicine | Admitting: Internal Medicine

## 2021-02-08 ENCOUNTER — Other Ambulatory Visit: Payer: Self-pay

## 2021-02-08 DIAGNOSIS — I4891 Unspecified atrial fibrillation: Secondary | ICD-10-CM | POA: Diagnosis not present

## 2021-02-08 DIAGNOSIS — Z9989 Dependence on other enabling machines and devices: Secondary | ICD-10-CM | POA: Diagnosis not present

## 2021-02-08 DIAGNOSIS — R001 Bradycardia, unspecified: Secondary | ICD-10-CM | POA: Insufficient documentation

## 2021-02-08 DIAGNOSIS — Z882 Allergy status to sulfonamides status: Secondary | ICD-10-CM | POA: Insufficient documentation

## 2021-02-08 DIAGNOSIS — Z7901 Long term (current) use of anticoagulants: Secondary | ICD-10-CM | POA: Insufficient documentation

## 2021-02-08 DIAGNOSIS — Z7952 Long term (current) use of systemic steroids: Secondary | ICD-10-CM | POA: Diagnosis not present

## 2021-02-08 DIAGNOSIS — Z881 Allergy status to other antibiotic agents status: Secondary | ICD-10-CM | POA: Diagnosis not present

## 2021-02-08 DIAGNOSIS — R42 Dizziness and giddiness: Secondary | ICD-10-CM | POA: Diagnosis not present

## 2021-02-08 DIAGNOSIS — G4733 Obstructive sleep apnea (adult) (pediatric): Secondary | ICD-10-CM | POA: Diagnosis not present

## 2021-02-08 DIAGNOSIS — I2510 Atherosclerotic heart disease of native coronary artery without angina pectoris: Secondary | ICD-10-CM | POA: Insufficient documentation

## 2021-02-08 DIAGNOSIS — I4819 Other persistent atrial fibrillation: Secondary | ICD-10-CM | POA: Diagnosis not present

## 2021-02-08 DIAGNOSIS — I10 Essential (primary) hypertension: Secondary | ICD-10-CM | POA: Diagnosis not present

## 2021-02-08 DIAGNOSIS — Z79899 Other long term (current) drug therapy: Secondary | ICD-10-CM | POA: Diagnosis not present

## 2021-02-08 HISTORY — PX: CARDIOVERSION: SHX1299

## 2021-02-08 SURGERY — CARDIOVERSION
Anesthesia: General

## 2021-02-08 MED ORDER — SODIUM CHLORIDE 0.9 % IV SOLN
INTRAVENOUS | Status: DC
  Administered 2021-02-08: 500 mL via INTRAVENOUS

## 2021-02-08 MED ORDER — LIDOCAINE 2% (20 MG/ML) 5 ML SYRINGE
INTRAMUSCULAR | Status: DC | PRN
  Administered 2021-02-08: 20 mg via INTRAVENOUS

## 2021-02-08 MED ORDER — PROPOFOL 10 MG/ML IV BOLUS
INTRAVENOUS | Status: DC | PRN
  Administered 2021-02-08: 100 mg via INTRAVENOUS

## 2021-02-08 NOTE — Anesthesia Postprocedure Evaluation (Signed)
Anesthesia Post Note  Patient: Cory Mathis  Procedure(s) Performed: CARDIOVERSION     Patient location during evaluation: Endoscopy Anesthesia Type: General Level of consciousness: awake and alert, oriented and patient cooperative Pain management: pain level controlled Vital Signs Assessment: post-procedure vital signs reviewed and stable Respiratory status: spontaneous breathing, nonlabored ventilation and respiratory function stable Cardiovascular status: blood pressure returned to baseline and stable Postop Assessment: no apparent nausea or vomiting and able to ambulate Anesthetic complications: no   No notable events documented.  Last Vitals:  Vitals:   02/08/21 0927 02/08/21 0938  BP: (!) 97/57 120/75  Pulse: (!) 56 60  Resp: 19 14  Temp:    SpO2: 97% 96%    Last Pain:  Vitals:   02/08/21 0938  TempSrc:   PainSc: 0-No pain                 Laker Thompson,E. Lenaya Pietsch

## 2021-02-08 NOTE — Anesthesia Preprocedure Evaluation (Addendum)
Anesthesia Evaluation  Patient identified by MRN, date of birth, ID band Patient awake    Reviewed: Allergy & Precautions, NPO status , Patient's Chart, lab work & pertinent test results  History of Anesthesia Complications Negative for: history of anesthetic complications  Airway Mallampati: II  TM Distance: >3 FB Neck ROM: Full    Dental  (+) Dental Advisory Given, Teeth Intact   Pulmonary sleep apnea and Continuous Positive Airway Pressure Ventilation , former smoker,    breath sounds clear to auscultation       Cardiovascular hypertension, Pt. on medications (-) angina+ dysrhythmias Atrial Fibrillation  Rhythm:Regular Rate:Normal  '20 ECHO: normal LVF, trace MR, trace AI   Neuro/Psych  Headaches, L frontal mass on CT and MRI in 2021   Bx showed inflammation   Rx steroids   Developed seizures   On Keppra, concern for glioma  01/12/21 CT angio:  Ill-defined hypodensity L frontal lobe and basal ganglia, significant intracranial vasculopathy. There is severe stenosis of the A2 segment bilaterally. There is severe stenosis of the terminal left ICA and left M1 segment. These areas have irregular long segment stenosis without calcification. Favor vasculitis rather than atherosclerotic disea    GI/Hepatic Neg liver ROS, GERD  Controlled,  Endo/Other  Hypothyroidism obese  Renal/GU negative Renal ROS     Musculoskeletal   Abdominal (+) + obese,   Peds  Hematology Xarelto   Anesthesia Other Findings   Reproductive/Obstetrics                           Anesthesia Physical Anesthesia Plan  ASA: 3  Anesthesia Plan: General   Post-op Pain Management:    Induction: Intravenous  PONV Risk Score and Plan: 2 and Treatment may vary due to age or medical condition  Airway Management Planned: Natural Airway and Mask  Additional Equipment: None  Intra-op Plan:   Post-operative Plan:   Informed  Consent: I have reviewed the patients History and Physical, chart, labs and discussed the procedure including the risks, benefits and alternatives for the proposed anesthesia with the patient or authorized representative who has indicated his/her understanding and acceptance.     Dental advisory given  Plan Discussed with: CRNA and Surgeon  Anesthesia Plan Comments:        Anesthesia Quick Evaluation

## 2021-02-08 NOTE — Discharge Instructions (Signed)

## 2021-02-08 NOTE — Anesthesia Procedure Notes (Signed)
Procedure Name: General with mask airway Date/Time: 02/08/2021 9:04 AM Performed by: Jenne Campus, CRNA Pre-anesthesia Checklist: Patient identified, Emergency Drugs available, Suction available and Patient being monitored Oxygen Delivery Method: Ambu bag

## 2021-02-08 NOTE — Transfer of Care (Signed)
Immediate Anesthesia Transfer of Care Note  Patient: Cory Mathis  Procedure(s) Performed: CARDIOVERSION  Patient Location: Endoscopy Unit  Anesthesia Type:General  Level of Consciousness: sedated and patient cooperative  Airway & Oxygen Therapy: Patient Spontanous Breathing and Patient connected to face mask oxygen  Post-op Assessment: Report given to RN and Post -op Vital signs reviewed and stable  Post vital signs: Reviewed  Last Vitals:  Vitals Value Taken Time  BP    Temp    Pulse    Resp    SpO2      Last Pain:  Vitals:   02/08/21 0807  TempSrc: Temporal  PainSc: 0-No pain         Complications: No notable events documented.

## 2021-02-08 NOTE — Interval H&P Note (Signed)
History and Physical Interval Note:  02/08/2021 8:38 AM  Cory Mathis  has presented today for surgery, with the diagnosis of AFIB.  The various methods of treatment have been discussed with the patient and family. After consideration of risks, benefits and other options for treatment, the patient has consented to  Procedure(s): CARDIOVERSION (N/A) as a surgical intervention.  The patient's history has been reviewed, patient examined, no change in status, stable for surgery.  I have reviewed the patient's chart and labs.  Questions were answered to the patient's satisfaction.     Dorris Carnes

## 2021-02-08 NOTE — CV Procedure (Signed)
CARDIOVERSION   Patient anesthetized with lidocaine and Propofol by anesthesia  With pads in AP position, patient cardioverted to SR with 200 J synchronized biphasic energy  Procedure was without complication  12 lead EKG pending   Dorris Carnes MD

## 2021-02-08 NOTE — Interval H&P Note (Signed)
History and Physical Interval Note:  02/08/2021 8:39 AM  Cory Mathis  has presented today for surgery, with the diagnosis of AFIB.  The various methods of treatment have been discussed with the patient and family. After consideration of risks, benefits and other options for treatment, the patient has consented to  Procedure(s): CARDIOVERSION (N/A) as a surgical intervention.  The patient's history has been reviewed, patient examined, no change in status, stable for surgery.  I have reviewed the patient's chart and labs.  Questions were answered to the patient's satisfaction.     Dorris Carnes

## 2021-02-10 ENCOUNTER — Encounter (HOSPITAL_COMMUNITY): Payer: Self-pay | Admitting: Internal Medicine

## 2021-02-10 DIAGNOSIS — D3131 Benign neoplasm of right choroid: Secondary | ICD-10-CM | POA: Diagnosis not present

## 2021-02-10 DIAGNOSIS — H43812 Vitreous degeneration, left eye: Secondary | ICD-10-CM | POA: Diagnosis not present

## 2021-02-10 DIAGNOSIS — H26493 Other secondary cataract, bilateral: Secondary | ICD-10-CM | POA: Diagnosis not present

## 2021-02-10 DIAGNOSIS — H43391 Other vitreous opacities, right eye: Secondary | ICD-10-CM | POA: Diagnosis not present

## 2021-02-15 ENCOUNTER — Ambulatory Visit (HOSPITAL_COMMUNITY)
Admission: RE | Admit: 2021-02-15 | Discharge: 2021-02-15 | Disposition: A | Discharge status: Home / Self Care | Payer: Medicare Other | Source: Ambulatory Visit | Attending: Nurse Practitioner | Admitting: Nurse Practitioner

## 2021-02-15 ENCOUNTER — Other Ambulatory Visit: Payer: Self-pay

## 2021-02-15 VITALS — BP 124/60 | HR 54 | Ht 70.5 in | Wt 218.4 lb

## 2021-02-15 DIAGNOSIS — Z79899 Other long term (current) drug therapy: Secondary | ICD-10-CM | POA: Diagnosis not present

## 2021-02-15 DIAGNOSIS — Z7901 Long term (current) use of anticoagulants: Secondary | ICD-10-CM | POA: Insufficient documentation

## 2021-02-15 DIAGNOSIS — G4733 Obstructive sleep apnea (adult) (pediatric): Secondary | ICD-10-CM | POA: Diagnosis not present

## 2021-02-15 DIAGNOSIS — J301 Allergic rhinitis due to pollen: Secondary | ICD-10-CM | POA: Diagnosis not present

## 2021-02-15 DIAGNOSIS — Z87891 Personal history of nicotine dependence: Secondary | ICD-10-CM | POA: Diagnosis not present

## 2021-02-15 DIAGNOSIS — I480 Paroxysmal atrial fibrillation: Secondary | ICD-10-CM

## 2021-02-15 DIAGNOSIS — Z8249 Family history of ischemic heart disease and other diseases of the circulatory system: Secondary | ICD-10-CM | POA: Diagnosis not present

## 2021-02-15 DIAGNOSIS — I10 Essential (primary) hypertension: Secondary | ICD-10-CM | POA: Insufficient documentation

## 2021-02-15 DIAGNOSIS — R001 Bradycardia, unspecified: Secondary | ICD-10-CM | POA: Diagnosis not present

## 2021-02-15 DIAGNOSIS — I4819 Other persistent atrial fibrillation: Secondary | ICD-10-CM

## 2021-02-15 DIAGNOSIS — D6869 Other thrombophilia: Secondary | ICD-10-CM | POA: Diagnosis not present

## 2021-02-15 DIAGNOSIS — J3089 Other allergic rhinitis: Secondary | ICD-10-CM | POA: Diagnosis not present

## 2021-02-15 NOTE — Progress Notes (Signed)
Primary Care Physician: Vernie Shanks, MD Referring Physician: Oda Kilts, PA Cardiologist: Dr. Terri Skains Cory Mathis is a 70 y.o. male with a h/o persistent afib, bradycardia, HTN, OSA, treated with cpap, that is in the afib clinic s/p successful  cardioversion, scheduled by Oda Kilts.   EKG shows sinus brady at 54 bpm, not symptomatic with this.  Per [name]'s note, he preferred not to be on AAD's and would prefer ablation in the future if needed. He was not aware of the afib, just noted higher HR's on his apple watch and requested appointment with  Dr. Harrington Challenger. He is already scheduled with Dr. Quentin Ore 10/27. He had a basically normal echo in 2020 and is scheduled for a updated echo.   He denies alcohol use, no tobacco, some caffeine, not excessive.    Today, he denies symptoms of palpitations, chest pain, shortness of breath, orthopnea, PND, lower extremity edema, dizziness, presyncope, syncope, or neurologic sequela. The patient is tolerating medications without difficulties and is otherwise without complaint today.   Past Medical History:  Diagnosis Date   Allergic rhinitis    Atrial fibrillation with rapid ventricular response (HCC)    Benign neoplasm of right choroid    BPH (benign prostatic hyperplasia)    Brain tumor (benign) (HCC)    Chest pain    Chronic headaches    Constipation    Diverticulosis of colon    Dysplastic nevus    Dysrhythmia 2019   Afib   Fatigue    Gastroesophageal reflux disease without esophagitis    GERD (gastroesophageal reflux disease)    prn med. 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   Hypertension    Hypothyroidism    Insomnia    Internal hemorrhoids    Left leg weakness    Memory loss    Nocturia    OSA on CPAP    CPAP nightly   Paresthesia of both feet    Retinal pigmentation    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[place])    Right thyroid nodule    Sinus bradycardia    Urinary retention 12/13/2016   Past Surgical History:  Procedure Laterality Date   APPLICATION OF CRANIAL NAVIGATION N/A 04/06/2020   Procedure: APPLICATION OF CRANIAL NAVIGATION;  Surgeon: Judith Part, MD;  Location: [place];  Service: Neurosurgery;  Laterality: N/A;   BRAIN SURGERY     CARDIOVERSION N/A 12/23/2018   Procedure: CARDIOVERSION;  Surgeon: Buford Dresser, MD;  Location: [facility] ENDOSCOPY;  Service: Cardiovascular;  Laterality: N/A;   CARDIOVERSION N/A 02/08/2021   Procedure: CARDIOVERSION;  Surgeon: Fay Records, MD;  Location: [place];  Service: Cardiovascular;  Laterality: N/A;   COLONOSCOPY  last one 08-07-2016   EYE SURGERY     FRAMELESS  BIOPSY WITH BRAINLAB Left 04/06/2020   Procedure: Left Stereotactic brain biopsy with brainlab;  Surgeon: Judith Part, MD;  Location: [place];  Service: Neurosurgery;  Laterality: Left;   HEMORRHOID BANDING  10-20-2016;  09-11-2016   HERNIA REPAIR  KNEE ARTHROSCOPY Bilateral    10 + yrs ago   LASIK Bilateral    PILONIDAL CYST EXCISION     THYROIDECTOMY Left 2013   TRANSURETHRAL RESECTION OF PROSTATE N/A 01/04/2017   Procedure: TRANSURETHRAL RESECTION OF THE PROSTATE (TURP);  Surgeon: Franchot Gallo, MD;  Location: [facility];  Service: Urology;  Laterality: N/A;   UPPER GASTROINTESTINAL ENDOSCOPY  10/30/2008   WISDOM TOOTH EXTRACTION      Current Outpatient Medications  Medication Sig Dispense Refill   amLODipine (NORVASC) 5 MG tablet Take 2.5 mg by mouth in the morning and at bedtime.     diltiazem (CARDIZEM CD) 120 MG 24 hr  capsule Take 1 capsule (120 mg total) by mouth daily. 90 capsule 3   EPINEPHrine 0.3 mg/0.3 mL IJ SOAJ injection Inject 0.3 mg into the muscle as needed for anaphylaxis.     eszopiclone (LUNESTA) 2 MG TABS tablet Take 2 mg by mouth at bedtime.     predniSONE (DELTASONE) 20 MG tablet Take 2 tablets (40 mg total) by mouth daily with breakfast. 120 tablet 0   rivaroxaban (XARELTO) 20 MG TABS tablet TAKE 1 TABLET(20 MG) BY MOUTH DAILY WITH SUPPER 90 tablet 1   rosuvastatin (CRESTOR) 5 MG tablet TAKE 1/2 TABLET(2.5 MG) BY MOUTH DAILY 45 tablet 2   tetrahydrozoline 0.05 % ophthalmic solution Place 1 drop into both eyes daily as needed (Dry eye).     No current facility-administered medications for this encounter.    Allergies  Allergen Reactions   Other     Mints - makes him sneeze   Sulfa Antibiotics Hives and Rash   Sulfasalazine Hives and Other (See Comments)    other   Metronidazole Other (See Comments)    Pt reported he was light headed for 2 weeks.      Social History   Socioeconomic History   Marital status: Married    Spouse name: Not on file   Number of children: 0   Years of education: 16+   Highest education level: Bachelor's degree (e.g., BA, AB, BS)  Occupational History   Occupation: Retired  Tobacco Use   Smoking status: Former   Smokeless tobacco: Never  Scientific laboratory technician Use: Never used  Substance and Sexual Activity   Alcohol use: Yes    Comment: social    Drug use: No   Sexual activity: Not on file  Other Topics Concern   Not on file  Social History Narrative   Left-handed.   1.5 cups caffeine per day.   Lives at home with his wife.   Social Determinants of Health   Financial Resource Strain: Not on file  Food Insecurity: Not on file  Transportation Needs: Not on file  Physical Activity: Not on file  Stress: Not on file  Social Connections: Not on file  Intimate Partner Violence: Not on file    Family History  Problem Relation Age of Onset    Heart attack Mother        67's   Other Father        atherosclerosis - 84's   Prostate cancer Maternal Grandfather    Other Sister        brain tumor - unsure if it was cancer - 66's   Colon cancer Neg Hx    Esophageal cancer Neg Hx    Pancreatic cancer Neg Hx    Rectal cancer Neg Hx    Stomach cancer Neg Hx  ROS- All systems are reviewed and negative except as per the HPI above  Physical Exam: Vitals:   02/15/21 0832  Weight: 99.1 kg  Height: 5' 10.5" (1.791 m)   Wt Readings from Last 3 Encounters:  02/15/21 99.1 kg  02/02/21 98 kg  01/10/21 96.9 kg    Labs: Lab Results  Component Value Date   NA 140 02/02/2021   K 4.1 02/02/2021   CL 103 02/02/2021   CO2 25 02/02/2021   GLUCOSE 86 02/02/2021   BUN 13 02/02/2021   CREATININE 0.80 02/02/2021   CALCIUM 9.6 02/02/2021   Lab Results  Component Value Date   INR 1.5 (H) 06/19/2020   Lab Results  Component Value Date   CHOL 106 08/22/2019   HDL 38 (L) 08/22/2019   LDLCALC 47 08/22/2019   TRIG 118 08/22/2019     GEN- The patient is well appearing, alert and oriented x 3 today.   Head- normocephalic, atraumatic Eyes-  Sclera clear, conjunctiva pink Ears- hearing intact Oropharynx- clear Neck- supple, no JVP Lymph- no cervical lymphadenopathy Lungs- Clear to ausculation bilaterally, normal work of breathing Heart- Regular rate and rhythm, no murmurs, rubs or gallops, PMI not laterally displaced GI- soft, NT, ND, + BS Extremities- no clubbing, cyanosis, or edema MS- no significant deformity or atrophy Skin- no rash or lesion Psych- euthymic mood, full affect Neuro- strength and sensation are intact  EKG-sinus brady at 54 bpm, pr int 172 ms, qrs int 142 ms, qtc 422 ms  Epic records reviewed   Assessment and Plan:  1. Persistent afib  Successful cardioversion and remains in SR, feels well, no complaints He continues on Cardizem 120 mg daily   2. CHA2DS2VASc  score of 2 Continue xarelto 20 mg  daily   3. HTN Stable   4. BMI 30.89 Kg/m Diet modification and exercise encouraged    5. Bradycardia  Not symptomatic  Watch HR   Has scheduled update echo and new pt appointment with Dr. Quentin Ore 10/27 afib clinic as needed   Butch Penny C. Leighanna Kirn, [facility] [address] [place],  [id number] [phone]

## 2021-02-16 DIAGNOSIS — I4891 Unspecified atrial fibrillation: Secondary | ICD-10-CM | POA: Diagnosis not present

## 2021-02-16 DIAGNOSIS — Z1159 Encounter for screening for other viral diseases: Secondary | ICD-10-CM | POA: Diagnosis not present

## 2021-02-16 DIAGNOSIS — I6789 Other cerebrovascular disease: Secondary | ICD-10-CM | POA: Diagnosis not present

## 2021-02-16 DIAGNOSIS — Z7901 Long term (current) use of anticoagulants: Secondary | ICD-10-CM | POA: Diagnosis not present

## 2021-02-16 DIAGNOSIS — Z79899 Other long term (current) drug therapy: Secondary | ICD-10-CM | POA: Diagnosis not present

## 2021-02-16 DIAGNOSIS — I700 Atherosclerosis of aorta: Secondary | ICD-10-CM | POA: Diagnosis not present

## 2021-02-16 DIAGNOSIS — G939 Disorder of brain, unspecified: Secondary | ICD-10-CM | POA: Diagnosis not present

## 2021-02-16 DIAGNOSIS — G629 Polyneuropathy, unspecified: Secondary | ICD-10-CM | POA: Diagnosis not present

## 2021-02-16 DIAGNOSIS — I679 Cerebrovascular disease, unspecified: Secondary | ICD-10-CM | POA: Diagnosis not present

## 2021-02-16 DIAGNOSIS — G603 Idiopathic progressive neuropathy: Secondary | ICD-10-CM | POA: Diagnosis not present

## 2021-02-16 DIAGNOSIS — G9389 Other specified disorders of brain: Secondary | ICD-10-CM | POA: Diagnosis not present

## 2021-02-16 DIAGNOSIS — I6522 Occlusion and stenosis of left carotid artery: Secondary | ICD-10-CM | POA: Diagnosis not present

## 2021-02-16 DIAGNOSIS — Z8249 Family history of ischemic heart disease and other diseases of the circulatory system: Secondary | ICD-10-CM | POA: Diagnosis not present

## 2021-02-16 DIAGNOSIS — I6529 Occlusion and stenosis of unspecified carotid artery: Secondary | ICD-10-CM | POA: Diagnosis not present

## 2021-02-16 DIAGNOSIS — R599 Enlarged lymph nodes, unspecified: Secondary | ICD-10-CM | POA: Diagnosis not present

## 2021-02-16 DIAGNOSIS — G5783 Other specified mononeuropathies of bilateral lower limbs: Secondary | ICD-10-CM | POA: Diagnosis not present

## 2021-02-16 DIAGNOSIS — R791 Abnormal coagulation profile: Secondary | ICD-10-CM | POA: Diagnosis not present

## 2021-02-17 DIAGNOSIS — J3089 Other allergic rhinitis: Secondary | ICD-10-CM | POA: Diagnosis not present

## 2021-02-17 DIAGNOSIS — J301 Allergic rhinitis due to pollen: Secondary | ICD-10-CM | POA: Diagnosis not present

## 2021-02-21 DIAGNOSIS — Z23 Encounter for immunization: Secondary | ICD-10-CM | POA: Diagnosis not present

## 2021-02-23 ENCOUNTER — Ambulatory Visit (HOSPITAL_COMMUNITY): Payer: Medicare Other | Attending: Cardiovascular Disease

## 2021-02-23 ENCOUNTER — Other Ambulatory Visit: Payer: Self-pay

## 2021-02-23 DIAGNOSIS — I480 Paroxysmal atrial fibrillation: Secondary | ICD-10-CM | POA: Diagnosis not present

## 2021-02-23 DIAGNOSIS — J3089 Other allergic rhinitis: Secondary | ICD-10-CM | POA: Diagnosis not present

## 2021-02-23 DIAGNOSIS — J301 Allergic rhinitis due to pollen: Secondary | ICD-10-CM | POA: Diagnosis not present

## 2021-02-23 LAB — ECHOCARDIOGRAM COMPLETE
AR max vel: 2.96 cm2
AV Area VTI: 3.03 cm2
AV Area mean vel: 2.89 cm2
AV Mean grad: 6 mmHg
AV Peak grad: 13.8 mmHg
Ao pk vel: 1.86 m/s
Area-P 1/2: 3.17 cm2
S' Lateral: 2.8 cm

## 2021-02-28 DIAGNOSIS — J301 Allergic rhinitis due to pollen: Secondary | ICD-10-CM | POA: Diagnosis not present

## 2021-02-28 DIAGNOSIS — J3089 Other allergic rhinitis: Secondary | ICD-10-CM | POA: Diagnosis not present

## 2021-03-04 DIAGNOSIS — Z23 Encounter for immunization: Secondary | ICD-10-CM | POA: Diagnosis not present

## 2021-03-07 DIAGNOSIS — J301 Allergic rhinitis due to pollen: Secondary | ICD-10-CM | POA: Diagnosis not present

## 2021-03-07 DIAGNOSIS — J3089 Other allergic rhinitis: Secondary | ICD-10-CM | POA: Diagnosis not present

## 2021-03-10 DIAGNOSIS — H26491 Other secondary cataract, right eye: Secondary | ICD-10-CM | POA: Diagnosis not present

## 2021-03-14 DIAGNOSIS — G4733 Obstructive sleep apnea (adult) (pediatric): Secondary | ICD-10-CM | POA: Diagnosis not present

## 2021-03-15 DIAGNOSIS — J301 Allergic rhinitis due to pollen: Secondary | ICD-10-CM | POA: Diagnosis not present

## 2021-03-15 DIAGNOSIS — J3089 Other allergic rhinitis: Secondary | ICD-10-CM | POA: Diagnosis not present

## 2021-03-19 ENCOUNTER — Other Ambulatory Visit: Payer: Self-pay | Admitting: Internal Medicine

## 2021-03-21 DIAGNOSIS — J3089 Other allergic rhinitis: Secondary | ICD-10-CM | POA: Diagnosis not present

## 2021-03-21 DIAGNOSIS — J301 Allergic rhinitis due to pollen: Secondary | ICD-10-CM | POA: Diagnosis not present

## 2021-03-24 DIAGNOSIS — M20011 Mallet finger of right finger(s): Secondary | ICD-10-CM | POA: Diagnosis not present

## 2021-03-28 DIAGNOSIS — J301 Allergic rhinitis due to pollen: Secondary | ICD-10-CM | POA: Diagnosis not present

## 2021-03-28 DIAGNOSIS — J3089 Other allergic rhinitis: Secondary | ICD-10-CM | POA: Diagnosis not present

## 2021-03-29 ENCOUNTER — Ambulatory Visit: Payer: Medicare Other | Admitting: Neurology

## 2021-03-30 DIAGNOSIS — R972 Elevated prostate specific antigen [PSA]: Secondary | ICD-10-CM | POA: Diagnosis not present

## 2021-04-01 DIAGNOSIS — R599 Enlarged lymph nodes, unspecified: Secondary | ICD-10-CM | POA: Diagnosis not present

## 2021-04-01 DIAGNOSIS — I6529 Occlusion and stenosis of unspecified carotid artery: Secondary | ICD-10-CM | POA: Diagnosis not present

## 2021-04-01 DIAGNOSIS — I679 Cerebrovascular disease, unspecified: Secondary | ICD-10-CM | POA: Diagnosis not present

## 2021-04-01 DIAGNOSIS — G939 Disorder of brain, unspecified: Secondary | ICD-10-CM | POA: Diagnosis not present

## 2021-04-01 DIAGNOSIS — E890 Postprocedural hypothyroidism: Secondary | ICD-10-CM | POA: Diagnosis not present

## 2021-04-04 DIAGNOSIS — J3089 Other allergic rhinitis: Secondary | ICD-10-CM | POA: Diagnosis not present

## 2021-04-04 DIAGNOSIS — J301 Allergic rhinitis due to pollen: Secondary | ICD-10-CM | POA: Diagnosis not present

## 2021-04-05 DIAGNOSIS — G253 Myoclonus: Secondary | ICD-10-CM | POA: Diagnosis not present

## 2021-04-05 DIAGNOSIS — I480 Paroxysmal atrial fibrillation: Secondary | ICD-10-CM | POA: Diagnosis not present

## 2021-04-05 DIAGNOSIS — Z87891 Personal history of nicotine dependence: Secondary | ICD-10-CM | POA: Diagnosis not present

## 2021-04-05 DIAGNOSIS — I10 Essential (primary) hypertension: Secondary | ICD-10-CM | POA: Diagnosis not present

## 2021-04-05 DIAGNOSIS — G936 Cerebral edema: Secondary | ICD-10-CM | POA: Diagnosis not present

## 2021-04-05 DIAGNOSIS — I63512 Cerebral infarction due to unspecified occlusion or stenosis of left middle cerebral artery: Secondary | ICD-10-CM | POA: Diagnosis not present

## 2021-04-05 DIAGNOSIS — Z0000 Encounter for general adult medical examination without abnormal findings: Secondary | ICD-10-CM | POA: Diagnosis not present

## 2021-04-05 DIAGNOSIS — G629 Polyneuropathy, unspecified: Secondary | ICD-10-CM | POA: Diagnosis not present

## 2021-04-05 DIAGNOSIS — G459 Transient cerebral ischemic attack, unspecified: Secondary | ICD-10-CM | POA: Diagnosis not present

## 2021-04-05 DIAGNOSIS — E785 Hyperlipidemia, unspecified: Secondary | ICD-10-CM | POA: Diagnosis not present

## 2021-04-05 DIAGNOSIS — Z20822 Contact with and (suspected) exposure to covid-19: Secondary | ICD-10-CM | POA: Diagnosis not present

## 2021-04-05 DIAGNOSIS — Z91018 Allergy to other foods: Secondary | ICD-10-CM | POA: Diagnosis not present

## 2021-04-05 DIAGNOSIS — K219 Gastro-esophageal reflux disease without esophagitis: Secondary | ICD-10-CM | POA: Diagnosis not present

## 2021-04-05 DIAGNOSIS — I776 Arteritis, unspecified: Secondary | ICD-10-CM | POA: Diagnosis not present

## 2021-04-05 DIAGNOSIS — G4733 Obstructive sleep apnea (adult) (pediatric): Secondary | ICD-10-CM | POA: Diagnosis not present

## 2021-04-05 DIAGNOSIS — Z7901 Long term (current) use of anticoagulants: Secondary | ICD-10-CM | POA: Diagnosis not present

## 2021-04-05 DIAGNOSIS — G4700 Insomnia, unspecified: Secondary | ICD-10-CM | POA: Diagnosis not present

## 2021-04-05 DIAGNOSIS — Z882 Allergy status to sulfonamides status: Secondary | ICD-10-CM | POA: Diagnosis not present

## 2021-04-05 DIAGNOSIS — I482 Chronic atrial fibrillation, unspecified: Secondary | ICD-10-CM | POA: Diagnosis not present

## 2021-04-05 DIAGNOSIS — Z1389 Encounter for screening for other disorder: Secondary | ICD-10-CM | POA: Diagnosis not present

## 2021-04-05 DIAGNOSIS — R29898 Other symptoms and signs involving the musculoskeletal system: Secondary | ICD-10-CM | POA: Diagnosis not present

## 2021-04-05 DIAGNOSIS — G939 Disorder of brain, unspecified: Secondary | ICD-10-CM | POA: Diagnosis not present

## 2021-04-06 DIAGNOSIS — Z0000 Encounter for general adult medical examination without abnormal findings: Secondary | ICD-10-CM | POA: Diagnosis not present

## 2021-04-06 DIAGNOSIS — Z1389 Encounter for screening for other disorder: Secondary | ICD-10-CM | POA: Diagnosis not present

## 2021-04-07 ENCOUNTER — Ambulatory Visit: Payer: Medicare Other | Admitting: Cardiology

## 2021-04-08 ENCOUNTER — Telehealth: Payer: Self-pay | Admitting: Internal Medicine

## 2021-04-08 NOTE — Telephone Encounter (Signed)
Called and spoke with Dr. Dub Mikes from [facility].  MD wants to know why pt is on both amlodipine and diltiazem.  I informed MD that diltiazem is for AF and amlodipine for BP.  MD reports she received an alert that pt should not be on both meds.  I will route to pharm D and  MD to review.

## 2021-04-08 NOTE — Telephone Encounter (Signed)
She called wanting to know why patient would need to be on both of these medication amLODipine (NORVASC) 5 MG tablet and diltiazem (CARDIZEM CD) 120 MG 24 hr capsule.  Patient is currently hospitalized at [facility]  They are withholding the amlodipine and only giving him the diltiazem.

## 2021-04-08 NOTE — Telephone Encounter (Signed)
Called Dr. Dub Mikes back in regards to medication concern.  I informed her of MD recommendation that both meds (amlodipine and diltiazem) are not high doses and will be okay together.  Dr. Dub Mikes reports pt most recent SBP is 147.  She had no further questions or concerns.

## 2021-04-08 NOTE — Telephone Encounter (Signed)
Left a message to call back.  This number is to a personal line not [facility].

## 2021-04-08 NOTE — Telephone Encounter (Signed)
   Dub Mikes with [facility] returning call

## 2021-04-11 DIAGNOSIS — J301 Allergic rhinitis due to pollen: Secondary | ICD-10-CM | POA: Diagnosis not present

## 2021-04-11 DIAGNOSIS — J3089 Other allergic rhinitis: Secondary | ICD-10-CM | POA: Diagnosis not present

## 2021-04-18 DIAGNOSIS — I4891 Unspecified atrial fibrillation: Secondary | ICD-10-CM | POA: Diagnosis not present

## 2021-04-18 DIAGNOSIS — J309 Allergic rhinitis, unspecified: Secondary | ICD-10-CM | POA: Diagnosis not present

## 2021-04-18 DIAGNOSIS — Z20822 Contact with and (suspected) exposure to covid-19: Secondary | ICD-10-CM | POA: Diagnosis not present

## 2021-04-18 DIAGNOSIS — G8191 Hemiplegia, unspecified affecting right dominant side: Secondary | ICD-10-CM | POA: Diagnosis not present

## 2021-04-18 DIAGNOSIS — E039 Hypothyroidism, unspecified: Secondary | ICD-10-CM | POA: Diagnosis not present

## 2021-04-18 DIAGNOSIS — J3089 Other allergic rhinitis: Secondary | ICD-10-CM | POA: Diagnosis not present

## 2021-04-18 DIAGNOSIS — Z5181 Encounter for therapeutic drug level monitoring: Secondary | ICD-10-CM | POA: Diagnosis not present

## 2021-04-18 DIAGNOSIS — G4730 Sleep apnea, unspecified: Secondary | ICD-10-CM | POA: Diagnosis not present

## 2021-04-18 DIAGNOSIS — J301 Allergic rhinitis due to pollen: Secondary | ICD-10-CM | POA: Diagnosis not present

## 2021-04-18 DIAGNOSIS — I776 Arteritis, unspecified: Secondary | ICD-10-CM | POA: Diagnosis not present

## 2021-04-20 ENCOUNTER — Encounter: Payer: Self-pay | Admitting: Gastroenterology

## 2021-04-20 ENCOUNTER — Ambulatory Visit (INDEPENDENT_AMBULATORY_CARE_PROVIDER_SITE_OTHER): Payer: Medicare Other | Admitting: Gastroenterology

## 2021-04-20 VITALS — BP 138/70 | HR 56 | Ht 70.5 in | Wt 219.5 lb

## 2021-04-20 DIAGNOSIS — K642 Third degree hemorrhoids: Secondary | ICD-10-CM

## 2021-04-20 DIAGNOSIS — K625 Hemorrhage of anus and rectum: Secondary | ICD-10-CM | POA: Diagnosis not present

## 2021-04-20 DIAGNOSIS — Z7901 Long term (current) use of anticoagulants: Secondary | ICD-10-CM | POA: Diagnosis not present

## 2021-04-20 DIAGNOSIS — I2510 Atherosclerotic heart disease of native coronary artery without angina pectoris: Secondary | ICD-10-CM | POA: Diagnosis not present

## 2021-04-20 NOTE — Progress Notes (Signed)
    History of Present Illness: This is a 70 year old male who relates problems with rectal bleeding and prolapsing hemorrhoids for several months.  He relates a long history of hemorrhoids and states he had hemorrhoidal banding performed about 5 years ago.  His bleeding and prolapse resolved for about a year and then returned at a low level and has gradually worsened over time.  He does not recall where previous banding was performed.  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.  His main treatment has been prednisone and he was started on monthly cyclophosphamide infusions 2 weeks ago. He is maintained on Xarelto for A. fib status post cardioversion in August 2022.  Most recent colonoscopy as below.  His rectal bleeding occurs with bowel movements and is primarily on the tissue paper outside of stools.  Colonoscopy 07/2016 - Three 6 to 7 mm polyps in the transverse colon and in the ascending colon, removed with a cold snare. Resected and retrieved. - Internal hemorrhoids, Grade II. - Moderate diverticulosis in the left colon. - The examination was otherwise normal on direct and retroflexion views.  Current Medications, Allergies, Past Medical History, Past Surgical History, Family History and Social History were reviewed in [employer] record.   Physical Exam: General: Well developed, well nourished, no acute distress Head: Normocephalic and atraumatic Eyes: Sclerae anicteric, EOMI Ears: Normal auditory acuity Mouth: Not examined, mask on during Covid-19 pandemic Lungs: Clear throughout to auscultation Heart: Regular rate and rhythm; no murmurs, rubs or bruits Abdomen: Soft, non tender and non distended. No masses, hepatosplenomegaly or hernias noted. Normal Bowel sounds Rectal: No external lesions, internal hemorrhoids, heme neg brown stool  Musculoskeletal: Symmetrical with no gross deformities  Pulses:  Normal pulses  noted Extremities: No clubbing, cyanosis, edema or deformities noted Neurological: Alert oriented x 4, grossly nonfocal Psychological:  Alert and cooperative. Normal mood and affect   Assessment and Recommendations:  Grade 3 bleeding internal hemorrhoids. Prior banding about 5 years ago. Repeat banding or surgical referral options were thoroughly reviewed with patient.  He prefers to proceed with hemorrhoidal banding and if not successful then he will consider surgical referral.  We discussed the elevated risk of bleeding complications while maintaining him on Xarelto versus the risk of holding Xarelto for each of the 3 banding sessions.  Given his ongoing neurologic problems and afib it seems safest to provide hemorrhoidal banding while remaining on Xarelto.  He agrees with this recommendation.  Schedule hemorrhoidal banding. Personal history of adenomatous colon polyps.  Surveillance colonoscopy is recommended in February 2023. Basal ganglia inflammatory brain lesion treated at [facility].  His main treatment has been prednisone and he was recently started on monthly cyclophosphamide infusions. Afib on Xarelto.

## 2021-04-20 NOTE — Patient Instructions (Signed)
We have scheduled hemorrhoid banding for 04/26/21 at 3:40 pm. If you want to contact your insurance company regarding coverage the CPT code for the banding is 46221. All you need to bring the day of your appointment is your co-pay.   The [facility] providers would like to encourage you to use [facility] to communicate with providers for non-urgent requests or questions.  Due to long hold times on the telephone, sending your provider a message by [facility] may be a faster and more efficient way to get a response.  Please allow 48 business hours for a response.  Please remember that this is for non-urgent requests.   Due to recent changes in healthcare laws, you may see the results of your imaging and laboratory studies on MyChart before your provider has had a chance to review them.  We understand that in some cases there may be results that are confusing or concerning to you. Not all laboratory results come back in the same time frame and the provider may be waiting for multiple results in order to interpret others.  Please give [place] 48 hours in order for your provider to thoroughly review all the results before contacting the office for clarification of your results.   Thank you for choosing me and [facility].  Pricilla Riffle. Dagoberto Ligas., MD., [facility] Regal

## 2021-04-22 DIAGNOSIS — Z5181 Encounter for therapeutic drug level monitoring: Secondary | ICD-10-CM | POA: Diagnosis not present

## 2021-04-25 DIAGNOSIS — J301 Allergic rhinitis due to pollen: Secondary | ICD-10-CM | POA: Diagnosis not present

## 2021-04-25 DIAGNOSIS — J3089 Other allergic rhinitis: Secondary | ICD-10-CM | POA: Diagnosis not present

## 2021-04-26 ENCOUNTER — Encounter: Payer: Self-pay | Admitting: Gastroenterology

## 2021-04-26 ENCOUNTER — Ambulatory Visit (INDEPENDENT_AMBULATORY_CARE_PROVIDER_SITE_OTHER): Payer: Medicare Other | Admitting: Gastroenterology

## 2021-04-26 VITALS — BP 128/62 | HR 60 | Ht 69.0 in | Wt 220.2 lb

## 2021-04-26 DIAGNOSIS — K648 Other hemorrhoids: Secondary | ICD-10-CM | POA: Diagnosis not present

## 2021-04-26 DIAGNOSIS — K642 Third degree hemorrhoids: Secondary | ICD-10-CM | POA: Diagnosis not present

## 2021-04-26 NOTE — Patient Instructions (Signed)
Please call our office to schedule your next hemorrhoid banding at least 2 weeks from today.   HEMORRHOID BANDING PROCEDURE    FOLLOW-UP CARE   The procedure you have had should have been relatively painless since the banding of the area involved does not have nerve endings and there is no pain sensation.  The rubber band cuts off the blood supply to the hemorrhoid and the band may fall off as soon as 48 hours after the banding (the band may occasionally be seen in the toilet bowl following a bowel movement). You may notice a temporary feeling of fullness in the rectum which should respond adequately to plain Tylenol or Motrin.  Following the banding, avoid strenuous exercise that evening and resume full activity the next day.  A sitz bath (soaking in a warm tub) or bidet is soothing, and can be useful for cleansing the area after bowel movements.     To avoid constipation, take two tablespoons of natural wheat bran, natural oat bran, flax, Benefiber or any over the counter fiber supplement and increase your water intake to 7-8 glasses daily.    Unless you have been prescribed anorectal medication, do not put anything inside your rectum for two weeks: No suppositories, enemas, fingers, etc.  Occasionally, you may have more bleeding than usual after the banding procedure.  This is often from the untreated hemorrhoids rather than the treated one.  Don't be concerned if there is a tablespoon or so of blood.  If there is more blood than this, lie flat with your bottom higher than your head and apply an ice pack to the area. If the bleeding does not stop within a half an hour or if you feel faint, call our office at [phone] or go to the emergency room.  Problems are not common; however, if there is a substantial amount of bleeding, severe pain, chills, fever or difficulty passing urine (very rare) or other problems, you should call us at [phone] or report to the nearest emergency  room.  Do not stay seated continuously for more than 2-3 hours for a day or two after the procedure.  Tighten your buttock muscles 10-15 times every two hours and take 10-15 deep breaths every 1-2 hours.  Do not spend more than a few minutes on the toilet if you cannot empty your bowel; instead re-visit the toilet at a later time.

## 2021-04-26 NOTE — Progress Notes (Signed)
PROCEDURE NOTE: The patient presents with symptomatic grade III bleeding hemorrhoids, requesting rubber band ligation of their hemorrhoidal disease.  All risks, benefits and alternative forms of therapy were described and informed consent was obtained. The patient is maintained on Xarelto which was continued for banding. Please refer to the 04/20/2021 office note.    The anorectum was pre-medicated during digital exam with 0.125% NTG and 5% lidocaine. In the Left Lateral Decubitus position anoscopic examination revealed grade III hemorrhoids in the LL > RP > RA positions.  The decision was made to band the LL internal hemorrhoid, and the [place] was used to perform band ligation without complication.  Digital anorectal examination was then performed to assure proper positioning of the band and to adjust the banded tissue as required. No adjustment was needed.  No complications were encountered and the patient tolerated the procedure well.  Dietary and behavioral recommendations were given and along with follow-up instructions.   Return for possible additional hemorrhoid banding in 2 - 3 weeks as required.  High fiber diet, Benefiber daily and at least 8 glasses of water daily. The patient was discharged home without pain or other post procedure problems.

## 2021-04-28 DIAGNOSIS — L814 Other melanin hyperpigmentation: Secondary | ICD-10-CM | POA: Diagnosis not present

## 2021-04-28 DIAGNOSIS — D1801 Hemangioma of skin and subcutaneous tissue: Secondary | ICD-10-CM | POA: Diagnosis not present

## 2021-04-28 DIAGNOSIS — L821 Other seborrheic keratosis: Secondary | ICD-10-CM | POA: Diagnosis not present

## 2021-04-28 DIAGNOSIS — D2261 Melanocytic nevi of right upper limb, including shoulder: Secondary | ICD-10-CM | POA: Diagnosis not present

## 2021-04-28 DIAGNOSIS — D2262 Melanocytic nevi of left upper limb, including shoulder: Secondary | ICD-10-CM | POA: Diagnosis not present

## 2021-04-28 DIAGNOSIS — D225 Melanocytic nevi of trunk: Secondary | ICD-10-CM | POA: Diagnosis not present

## 2021-04-28 DIAGNOSIS — D2371 Other benign neoplasm of skin of right lower limb, including hip: Secondary | ICD-10-CM | POA: Diagnosis not present

## 2021-05-03 DIAGNOSIS — J301 Allergic rhinitis due to pollen: Secondary | ICD-10-CM | POA: Diagnosis not present

## 2021-05-03 DIAGNOSIS — J3089 Other allergic rhinitis: Secondary | ICD-10-CM | POA: Diagnosis not present

## 2021-05-11 DIAGNOSIS — I776 Arteritis, unspecified: Secondary | ICD-10-CM | POA: Diagnosis not present

## 2021-05-11 DIAGNOSIS — G939 Disorder of brain, unspecified: Secondary | ICD-10-CM | POA: Diagnosis not present

## 2021-05-12 DIAGNOSIS — M20011 Mallet finger of right finger(s): Secondary | ICD-10-CM | POA: Diagnosis not present

## 2021-05-12 DIAGNOSIS — J301 Allergic rhinitis due to pollen: Secondary | ICD-10-CM | POA: Diagnosis not present

## 2021-05-12 DIAGNOSIS — J3089 Other allergic rhinitis: Secondary | ICD-10-CM | POA: Diagnosis not present

## 2021-05-16 ENCOUNTER — Encounter: Payer: Self-pay | Admitting: Gastroenterology

## 2021-05-16 ENCOUNTER — Ambulatory Visit (INDEPENDENT_AMBULATORY_CARE_PROVIDER_SITE_OTHER): Payer: Medicare Other | Admitting: Gastroenterology

## 2021-05-16 VITALS — BP 140/62 | HR 76 | Ht 69.0 in | Wt 225.2 lb

## 2021-05-16 DIAGNOSIS — K642 Third degree hemorrhoids: Secondary | ICD-10-CM | POA: Diagnosis not present

## 2021-05-16 DIAGNOSIS — K648 Other hemorrhoids: Secondary | ICD-10-CM

## 2021-05-16 NOTE — Progress Notes (Signed)
PROCEDURE NOTE: The patient presents with symptomatic bleeding grade III hemorrhoids, requesting rubber band ligation of their hemorrhoidal disease.  All risks, benefits and alternative forms of therapy were described and informed consent was obtained. The patient is maintained on Xarelto which was continued for banding -  refer to the 04/20/2021 office note.    The LL hemorrhoid was banded on 11/15 and the patient reports a significant decrease in prolapse and bleeding.   The anorectum was pre-medicated during digital exam with 0.125% NTG and 5% lidocaine.  The decision was made to band the RP internal hemorrhoid, and the [place] was used to perform band ligation without complication.  Digital anorectal examination was then performed to assure proper positioning of the band and to adjust the banded tissue as required. No adjustment was needed.  No complications were encountered and the patient tolerated the procedure well.  Dietary and behavioral recommendations were given and along with follow-up instructions.   Return for possible additional hemorrhoid banding in 2 - 3 weeks as required.  High fiber diet, Benefiber daily and at least 8 glasses of water daily. The patient was discharged home without pain or other post procedure problems.

## 2021-05-16 NOTE — Patient Instructions (Signed)
HEMORRHOID BANDING PROCEDURE    FOLLOW-UP CARE   The procedure you have had should have been relatively painless since the banding of the area involved does not have nerve endings and there is no pain sensation.  The rubber band cuts off the blood supply to the hemorrhoid and the band may fall off as soon as 48 hours after the banding (the band may occasionally be seen in the toilet bowl following a bowel movement). You may notice a temporary feeling of fullness in the rectum which should respond adequately to plain Tylenol or Motrin.  Following the banding, avoid strenuous exercise that evening and resume full activity the next day.  A sitz bath (soaking in a warm tub) or bidet is soothing, and can be useful for cleansing the area after bowel movements.     To avoid constipation, take two tablespoons of natural wheat bran, natural oat bran, flax, Benefiber or any over the counter fiber supplement and increase your water intake to 7-8 glasses daily.    Unless you have been prescribed anorectal medication, do not put anything inside your rectum for two weeks: No suppositories, enemas, fingers, etc.  Occasionally, you may have more bleeding than usual after the banding procedure.  This is often from the untreated hemorrhoids rather than the treated one.  Don't be concerned if there is a tablespoon or so of blood.  If there is more blood than this, lie flat with your bottom higher than your head and apply an ice pack to the area. If the bleeding does not stop within a half an hour or if you feel faint, call our office at [phone] or go to the emergency room.  Problems are not common; however, if there is a substantial amount of bleeding, severe pain, chills, fever or difficulty passing urine (very rare) or other problems, you should call us at [phone] or report to the nearest emergency room.  Do not stay seated continuously for more than 2-3 hours for a day or two after the procedure.   Tighten your buttock muscles 10-15 times every two hours and take 10-15 deep breaths every 1-2 hours.  Do not spend more than a few minutes on the toilet if you cannot empty your bowel; instead re-visit the toilet at a later time.    

## 2021-05-18 DIAGNOSIS — I776 Arteritis, unspecified: Secondary | ICD-10-CM | POA: Diagnosis not present

## 2021-05-19 ENCOUNTER — Ambulatory Visit: Payer: Medicare Other | Admitting: Cardiology

## 2021-05-19 DIAGNOSIS — J3089 Other allergic rhinitis: Secondary | ICD-10-CM | POA: Diagnosis not present

## 2021-05-19 DIAGNOSIS — J301 Allergic rhinitis due to pollen: Secondary | ICD-10-CM | POA: Diagnosis not present

## 2021-05-24 ENCOUNTER — Encounter (HOSPITAL_COMMUNITY): Payer: Self-pay

## 2021-05-24 ENCOUNTER — Emergency Department (HOSPITAL_COMMUNITY): Payer: Medicare Other

## 2021-05-24 ENCOUNTER — Emergency Department (HOSPITAL_COMMUNITY)
Admission: EM | Admit: 2021-05-24 | Discharge: 2021-05-24 | Disposition: A | Discharge status: Home / Self Care | Payer: Medicare Other | Attending: Student | Admitting: Student

## 2021-05-24 ENCOUNTER — Other Ambulatory Visit: Payer: Self-pay

## 2021-05-24 DIAGNOSIS — R519 Headache, unspecified: Secondary | ICD-10-CM | POA: Diagnosis not present

## 2021-05-24 DIAGNOSIS — R079 Chest pain, unspecified: Secondary | ICD-10-CM | POA: Insufficient documentation

## 2021-05-24 DIAGNOSIS — U071 COVID-19: Secondary | ICD-10-CM | POA: Diagnosis not present

## 2021-05-24 DIAGNOSIS — Z79899 Other long term (current) drug therapy: Secondary | ICD-10-CM | POA: Diagnosis not present

## 2021-05-24 DIAGNOSIS — I10 Essential (primary) hypertension: Secondary | ICD-10-CM | POA: Insufficient documentation

## 2021-05-24 DIAGNOSIS — S0990XA Unspecified injury of head, initial encounter: Secondary | ICD-10-CM | POA: Diagnosis not present

## 2021-05-24 DIAGNOSIS — Z87891 Personal history of nicotine dependence: Secondary | ICD-10-CM | POA: Diagnosis not present

## 2021-05-24 DIAGNOSIS — Z86018 Personal history of other benign neoplasm: Secondary | ICD-10-CM | POA: Diagnosis not present

## 2021-05-24 DIAGNOSIS — W19XXXA Unspecified fall, initial encounter: Secondary | ICD-10-CM | POA: Diagnosis not present

## 2021-05-24 DIAGNOSIS — I959 Hypotension, unspecified: Secondary | ICD-10-CM | POA: Diagnosis not present

## 2021-05-24 DIAGNOSIS — E039 Hypothyroidism, unspecified: Secondary | ICD-10-CM | POA: Diagnosis not present

## 2021-05-24 DIAGNOSIS — R509 Fever, unspecified: Secondary | ICD-10-CM | POA: Diagnosis not present

## 2021-05-24 DIAGNOSIS — R0789 Other chest pain: Secondary | ICD-10-CM | POA: Diagnosis not present

## 2021-05-24 DIAGNOSIS — R791 Abnormal coagulation profile: Secondary | ICD-10-CM | POA: Diagnosis not present

## 2021-05-24 DIAGNOSIS — Z0389 Encounter for observation for other suspected diseases and conditions ruled out: Secondary | ICD-10-CM | POA: Diagnosis not present

## 2021-05-24 DIAGNOSIS — R0902 Hypoxemia: Secondary | ICD-10-CM | POA: Diagnosis not present

## 2021-05-24 DIAGNOSIS — S299XXA Unspecified injury of thorax, initial encounter: Secondary | ICD-10-CM | POA: Diagnosis not present

## 2021-05-24 LAB — RESP PANEL BY RT-PCR (FLU A&B, COVID) ARPGX2
Influenza A by PCR: NEGATIVE
Influenza B by PCR: NEGATIVE
SARS Coronavirus 2 by RT PCR: POSITIVE — AB

## 2021-05-24 LAB — CBC WITH DIFFERENTIAL/PLATELET
Abs Immature Granulocytes: 0.16 10*3/uL — ABNORMAL HIGH (ref 0.00–0.07)
Basophils Absolute: 0 10*3/uL (ref 0.0–0.1)
Basophils Relative: 0 %
Eosinophils Absolute: 0 10*3/uL (ref 0.0–0.5)
Eosinophils Relative: 0 %
HCT: 38 % — ABNORMAL LOW (ref 39.0–52.0)
Hemoglobin: 13.2 g/dL (ref 13.0–17.0)
Immature Granulocytes: 2 %
Lymphocytes Relative: 3 %
Lymphs Abs: 0.3 10*3/uL — ABNORMAL LOW (ref 0.7–4.0)
MCH: 33.8 pg (ref 26.0–34.0)
MCHC: 34.7 g/dL (ref 30.0–36.0)
MCV: 97.2 fL (ref 80.0–100.0)
Monocytes Absolute: 0.9 10*3/uL (ref 0.1–1.0)
Monocytes Relative: 9 %
Neutro Abs: 9.2 10*3/uL — ABNORMAL HIGH (ref 1.7–7.7)
Neutrophils Relative %: 86 %
Platelets: 152 10*3/uL (ref 150–400)
RBC: 3.91 MIL/uL — ABNORMAL LOW (ref 4.22–5.81)
RDW: 12.8 % (ref 11.5–15.5)
WBC: 10.5 10*3/uL (ref 4.0–10.5)
nRBC: 0 % (ref 0.0–0.2)

## 2021-05-24 LAB — COMPREHENSIVE METABOLIC PANEL
ALT: 29 U/L (ref 0–44)
AST: 17 U/L (ref 15–41)
Albumin: 3.7 g/dL (ref 3.5–5.0)
Alkaline Phosphatase: 47 U/L (ref 38–126)
Anion gap: 8 (ref 5–15)
BUN: 16 mg/dL (ref 8–23)
CO2: 23 mmol/L (ref 22–32)
Calcium: 8.7 mg/dL — ABNORMAL LOW (ref 8.9–10.3)
Chloride: 104 mmol/L (ref 98–111)
Creatinine, Ser: 1.03 mg/dL (ref 0.61–1.24)
GFR, Estimated: >60 mL/min (ref >60)
Glucose, Bld: 103 mg/dL — ABNORMAL HIGH (ref 70–99)
Potassium: 3.7 mmol/L (ref 3.5–5.1)
Sodium: 135 mmol/L (ref 135–145)
Total Bilirubin: 1.2 mg/dL (ref 0.3–1.2)
Total Protein: 6.3 g/dL — ABNORMAL LOW (ref 6.5–8.1)

## 2021-05-24 LAB — URINALYSIS, ROUTINE W REFLEX MICROSCOPIC
Bilirubin Urine: NEGATIVE
Glucose, UA: NEGATIVE mg/dL
Ketones, ur: 5 mg/dL — AB
Leukocytes,Ua: NEGATIVE
Nitrite: NEGATIVE
Protein, ur: NEGATIVE mg/dL
Specific Gravity, Urine: 1.023 (ref 1.005–1.030)
pH: 5 (ref 5.0–8.0)

## 2021-05-24 LAB — LACTIC ACID, PLASMA: Lactic Acid, Venous: 0.8 mmol/L (ref 0.5–1.9)

## 2021-05-24 LAB — PROTIME-INR
INR: 1.8 — ABNORMAL HIGH (ref 0.8–1.2)
Prothrombin Time: 20.7 seconds — ABNORMAL HIGH (ref 11.4–15.2)

## 2021-05-24 LAB — APTT: aPTT: 37 seconds — ABNORMAL HIGH (ref 24–36)

## 2021-05-24 IMAGING — CT CT CHEST W/O CM
2 of 3 series · 15 of 36 positions shown, 18 images · non-contrast
Comparison: Chest radiograph performed earlier on the same date

CLINICAL DATA: Rib fracture suspected, trauma.  Chest wall pain

EXAM:
CT CHEST WITHOUT CONTRAST
TECHNIQUE: Multidetector CT imaging of the chest was performed following the
standard protocol without IV contrast.

[Series 2: thorax · axial · 0.84mm/px · z∈[+166,+432]mm · 12 of 157 slices shown, 15 images]
[im 12/157  mediastinal]
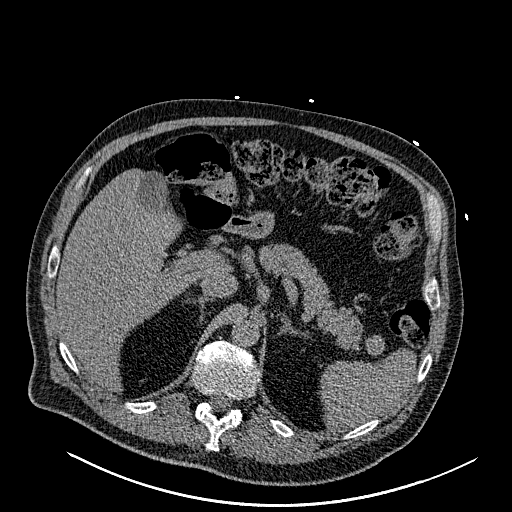
[im 12/157  lung]
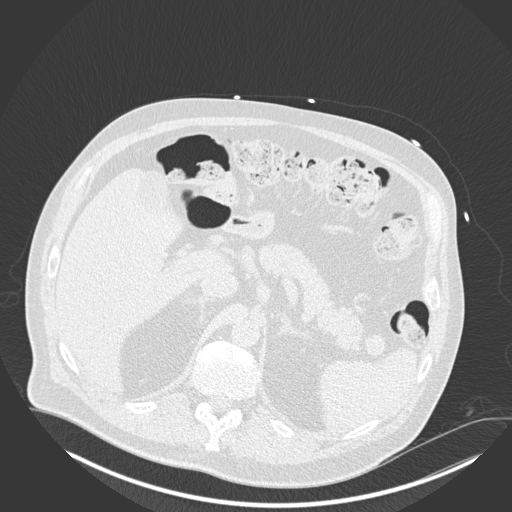
[im 24/157  lung]
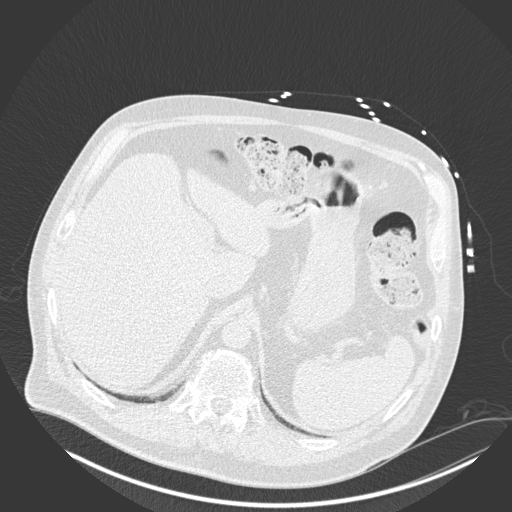
[im 35/157  lung]
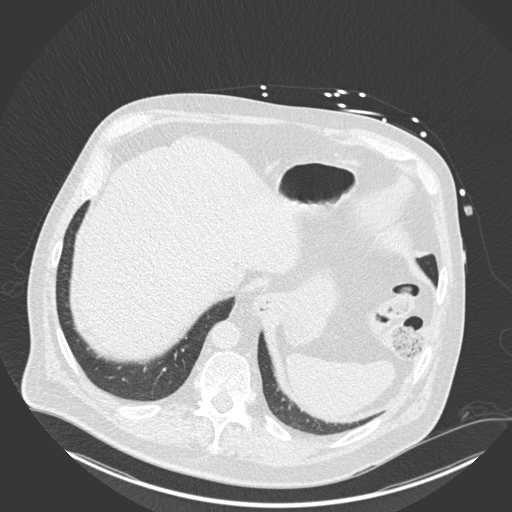
[im 47/157  lung]
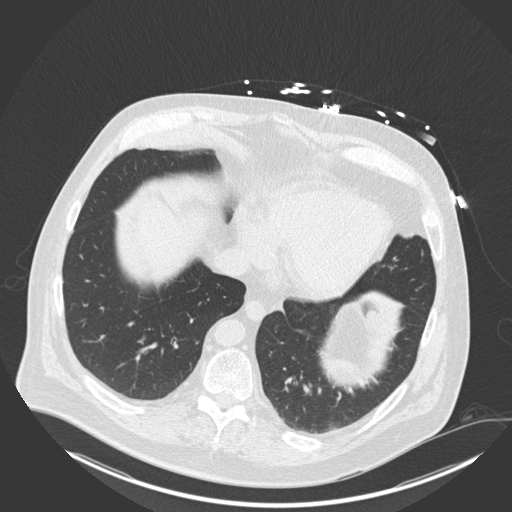
[im 58/157  mediastinal]
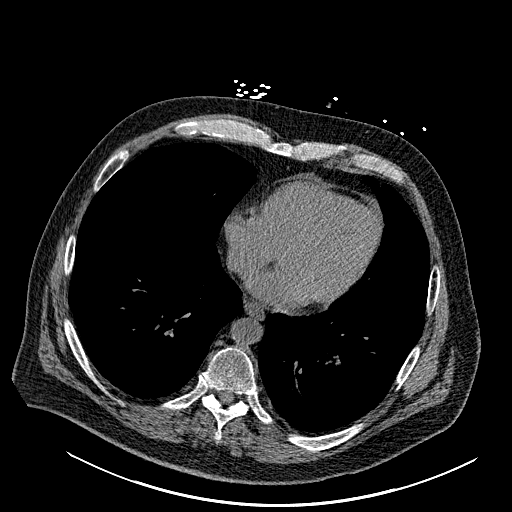
[im 58/157  lung]
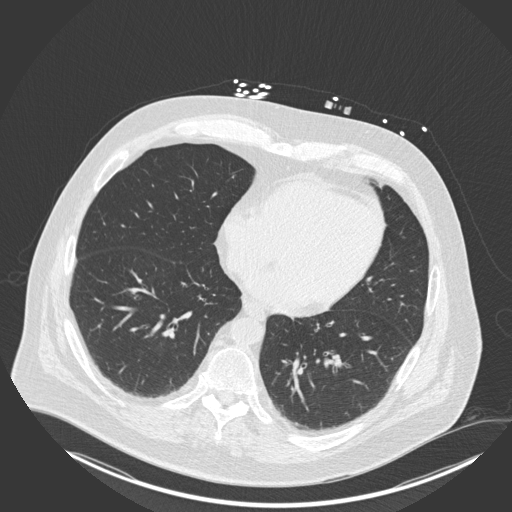
[im 70/157  lung]
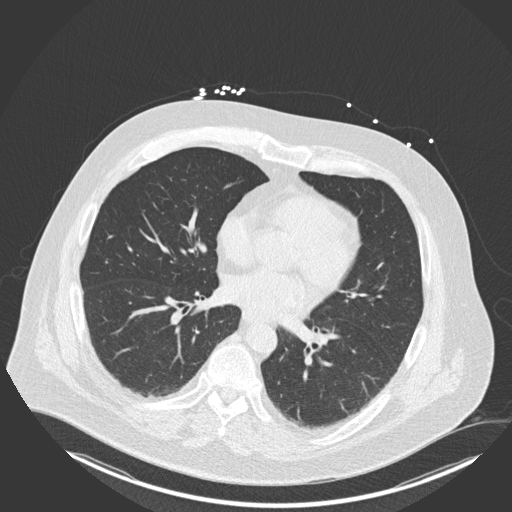
[im 87/157  lung]
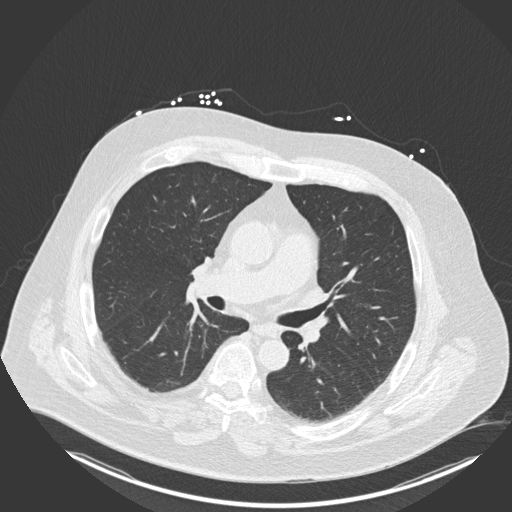
[im 99/157  lung]
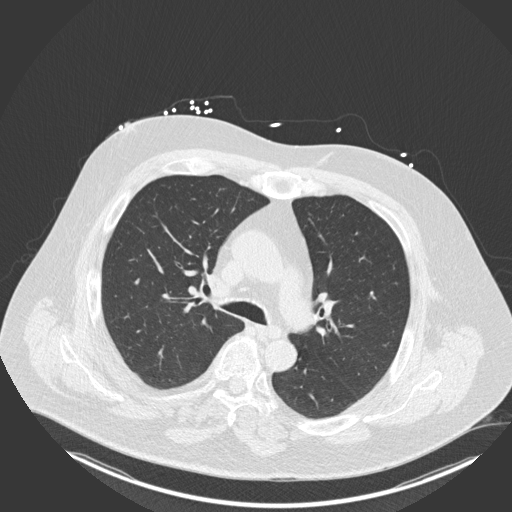
[im 110/157  mediastinal]
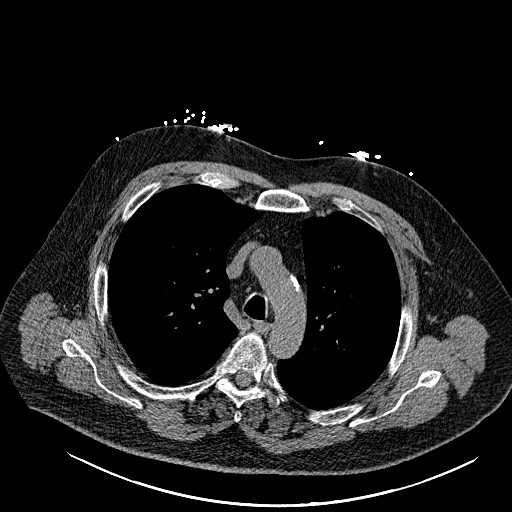
[im 110/157  lung]
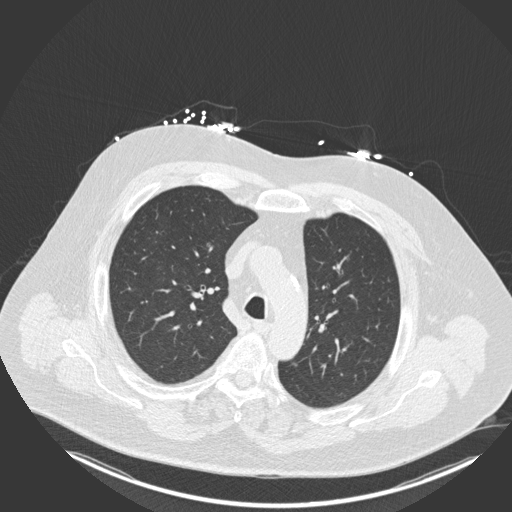
[im 122/157  lung]
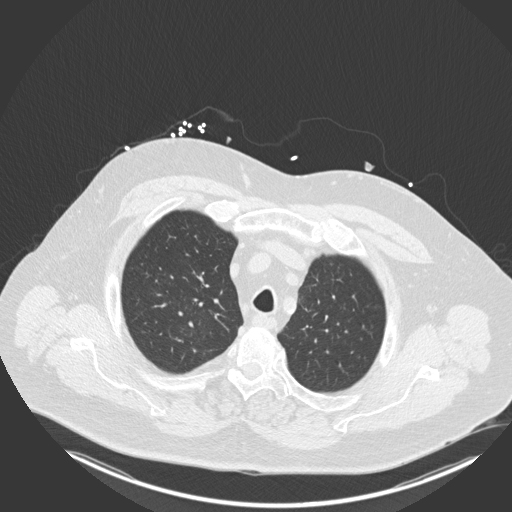
[im 133/157  lung]
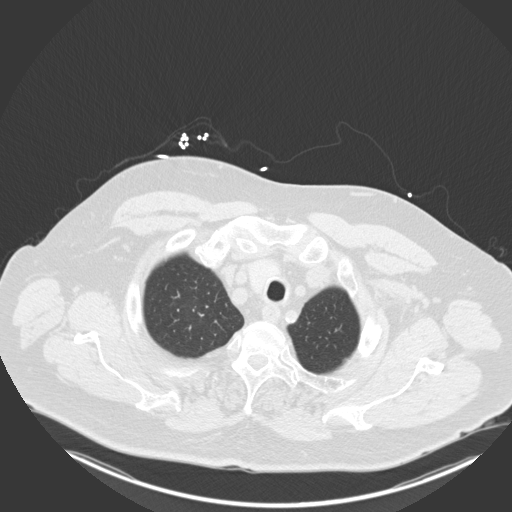
[im 145/157  lung]
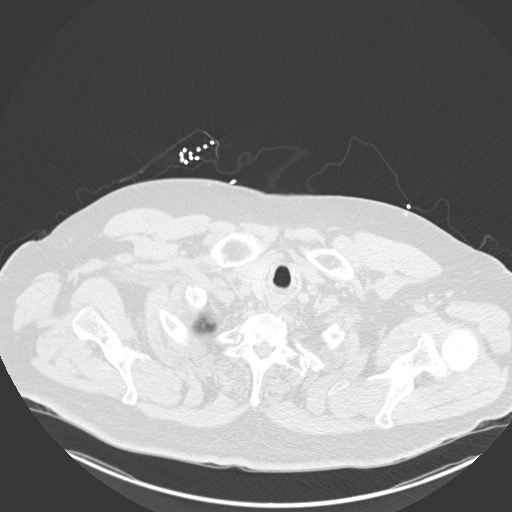

[Series 6: coronal · coronal · 0.61mm/px · 3 of 152 slices shown]
[im 31/152  lung]
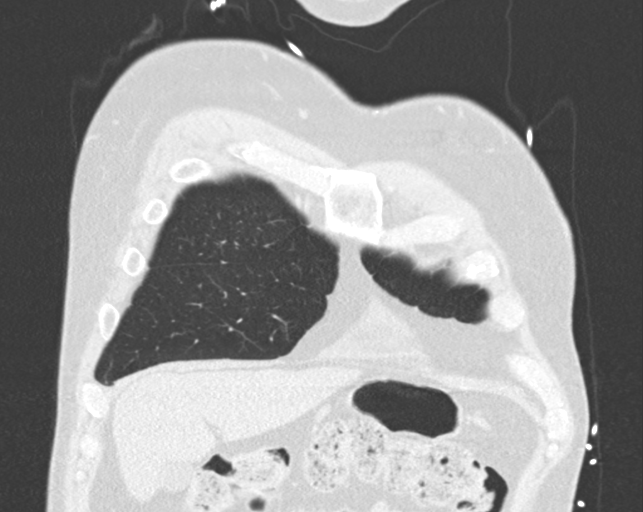
[im 61/152  lung]
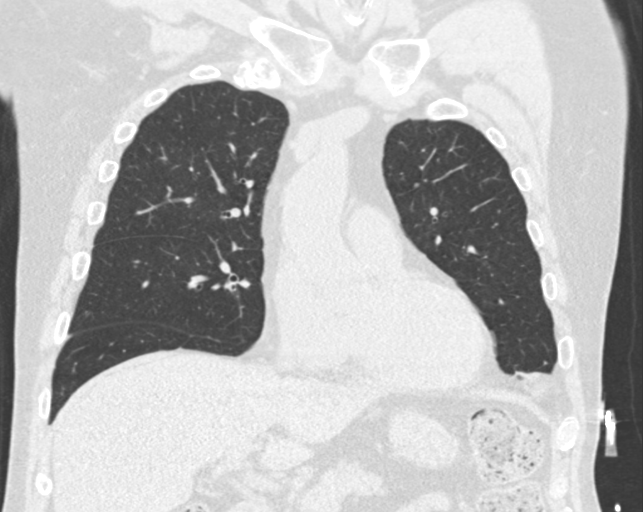
[im 91/152  lung]
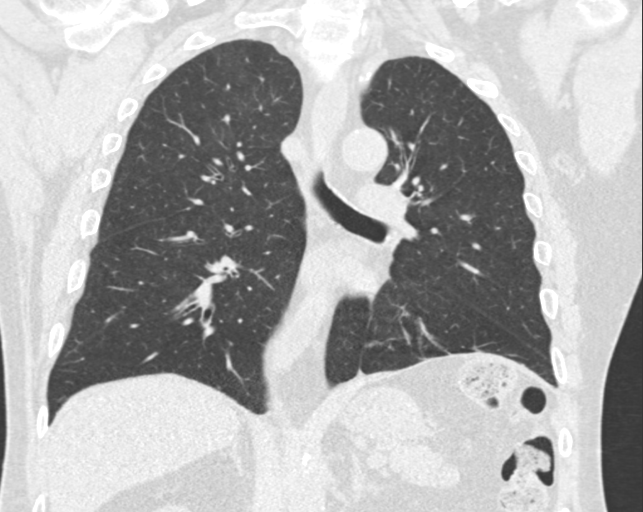

[15 of 36 positions shown; findings below may reference images not displayed]

FINDINGS: Cardiovascular: No significant vascular findings. Normal heart size.
No pericardial effusion.

Mediastinum/Nodes: No enlarged mediastinal or axillary lymph nodes.
Right thyroid lobe protrudes into the thoracic inlet. Left thyroid
lobe not visualized, correlate with prior history. Trachea, and
esophagus demonstrate no significant findings.

Lungs/Pleura: Lungs are clear. No pleural effusion or pneumothorax.

Upper Abdomen: No acute abnormality.

Musculoskeletal: No fracture is seen.
IMPRESSION: 1.  No appreciable rib fracture.

2.  No CT evidence of acute intrathoracic abnormality.

## 2021-05-24 IMAGING — CT CT HEAD W/O CM
3 series · 14 of 47 positions shown, 16 images · non-contrast
Comparison: MRI head [DATE], CT head [DATE]

CLINICAL DATA: Head trauma, moderate-severe fall, on thinners

EXAM:
CT HEAD WITHOUT CONTRAST
TECHNIQUE: Contiguous axial images were obtained from the base of the skull
through the vertex without intravenous contrast.

[Series 2: head wo · axial · 0.47mm/px · z∈[+1184,+1309]mm · 8 of 31 slices shown, 10 images]
[im 3/31  brain]
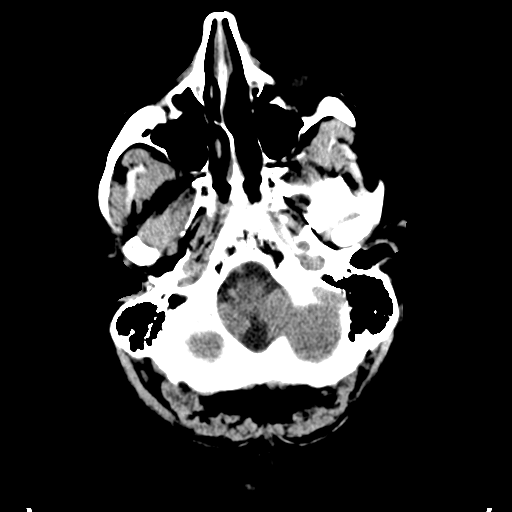
[im 3/31  bone]
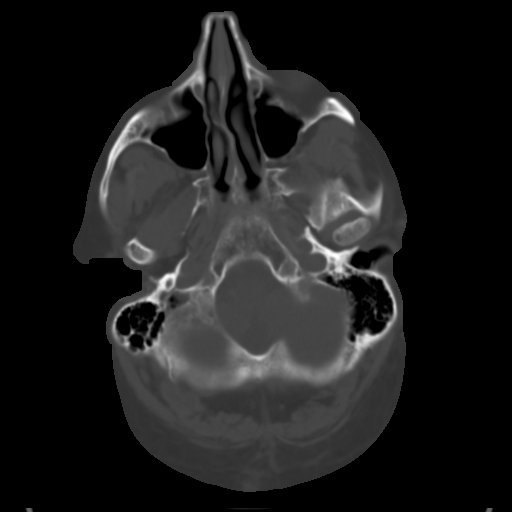
[im 7/31  brain]
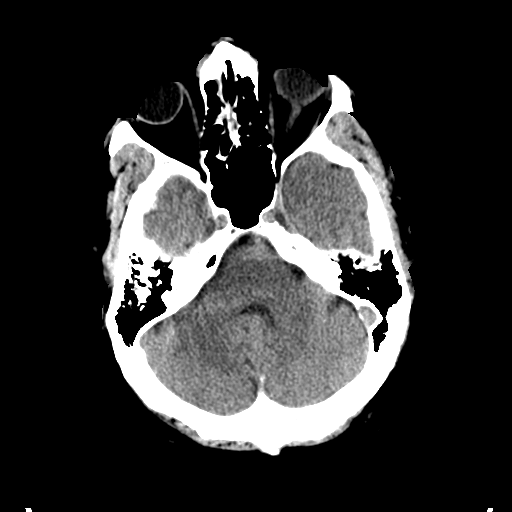
[im 10/31  brain]
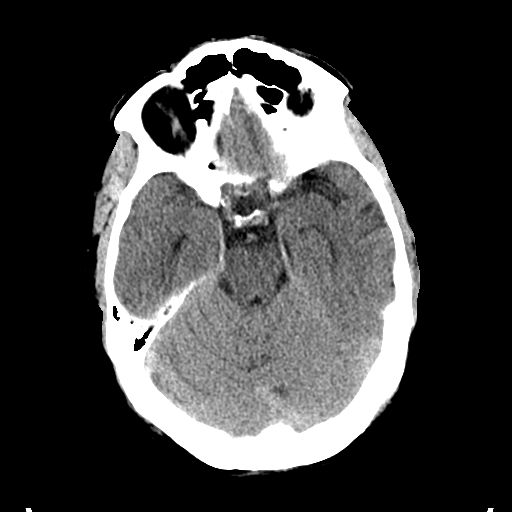
[im 14/31  brain]
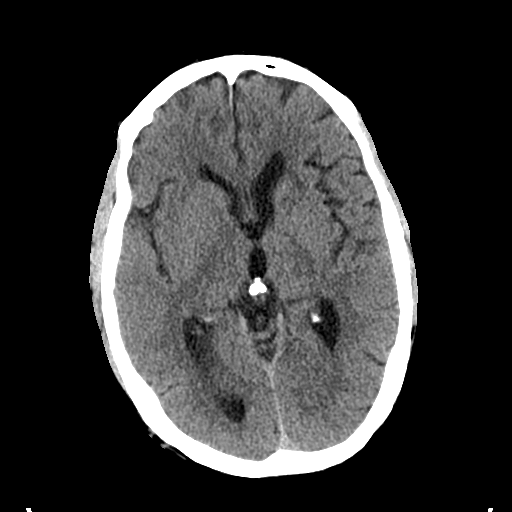
[im 17/31  brain]
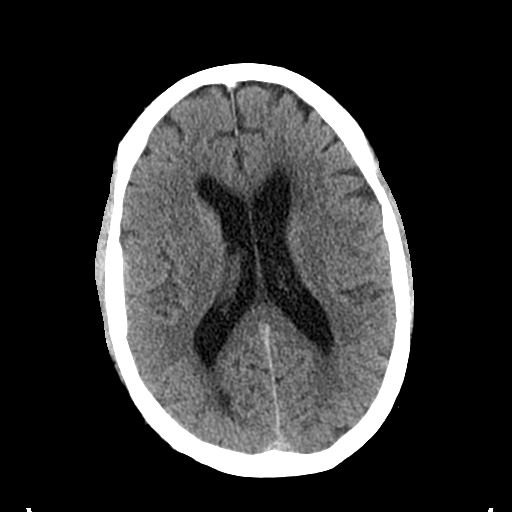
[im 17/31  bone]
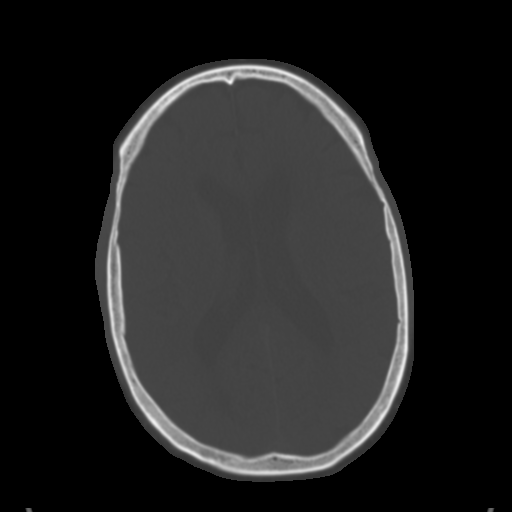
[im 21/31  brain]
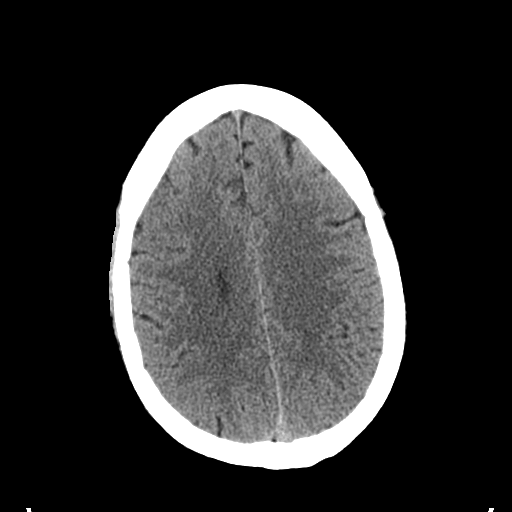
[im 24/31  brain]
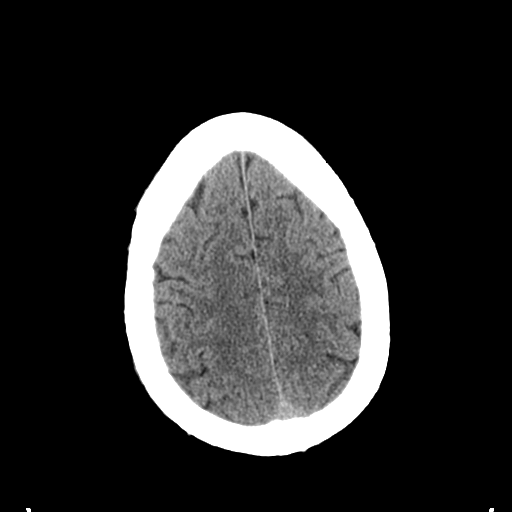
[im 28/31  brain]
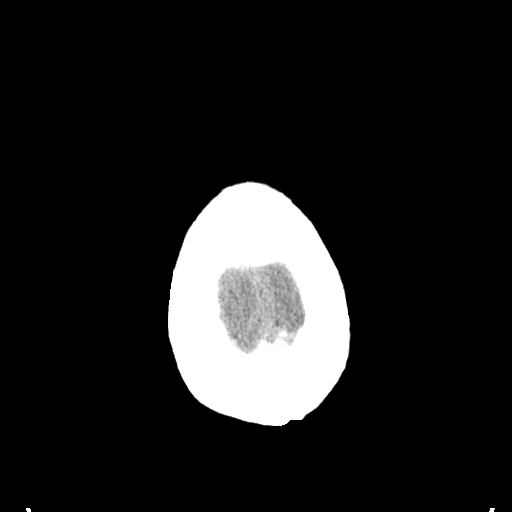

[Series 4: coronal soft tissue · coronal · 0.41mm/px · 3 of 69 slices shown]
[im 23/69  brain]
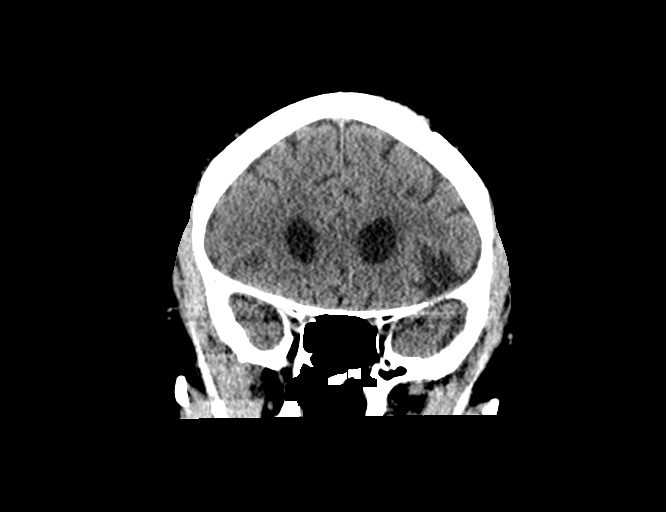
[im 31/69  brain]
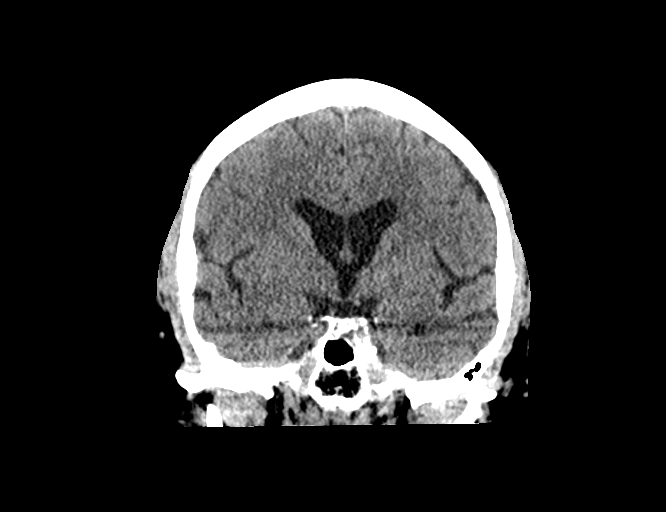
[im 38/69  brain]
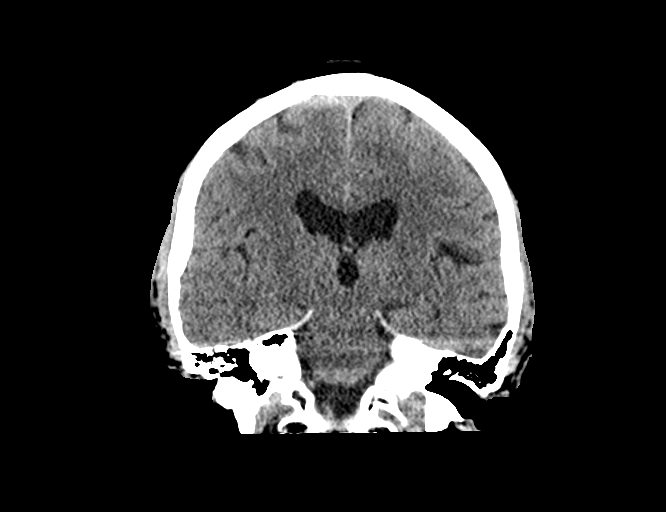

[Series 5: sagittal soft tissue · sagittal · 0.38mm/px · 3 of 52 slices shown]
[im 18/52  brain]
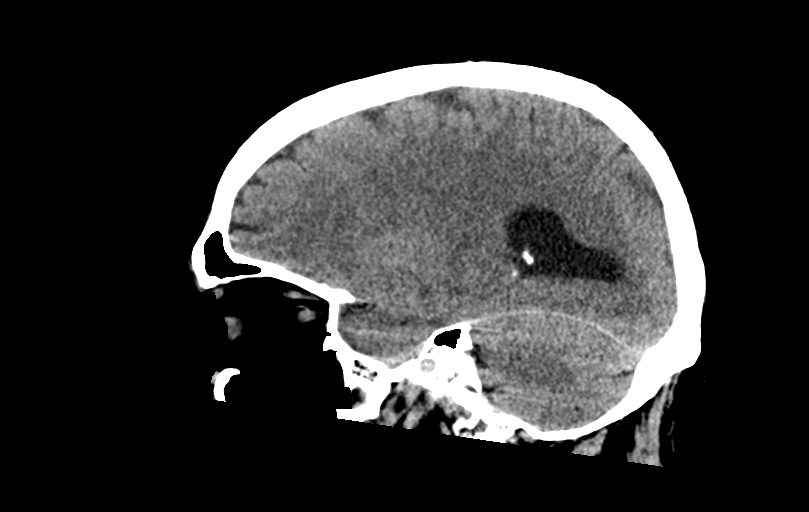
[im 26/52  brain]
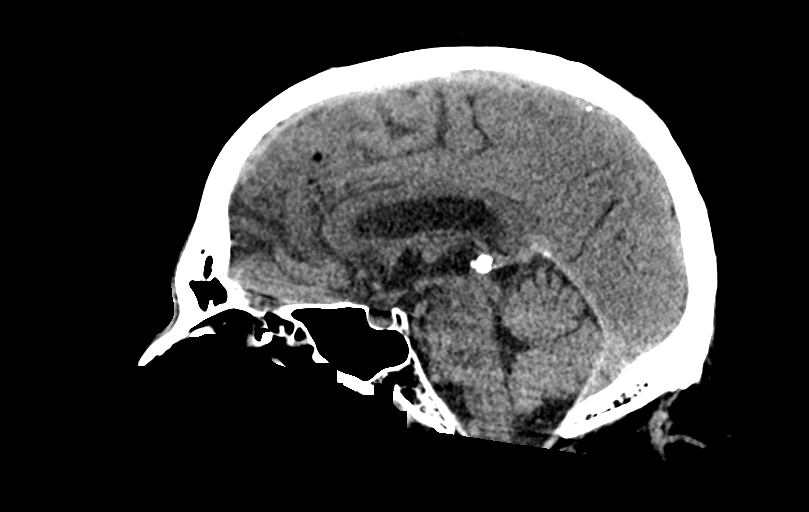
[im 35/52  brain]
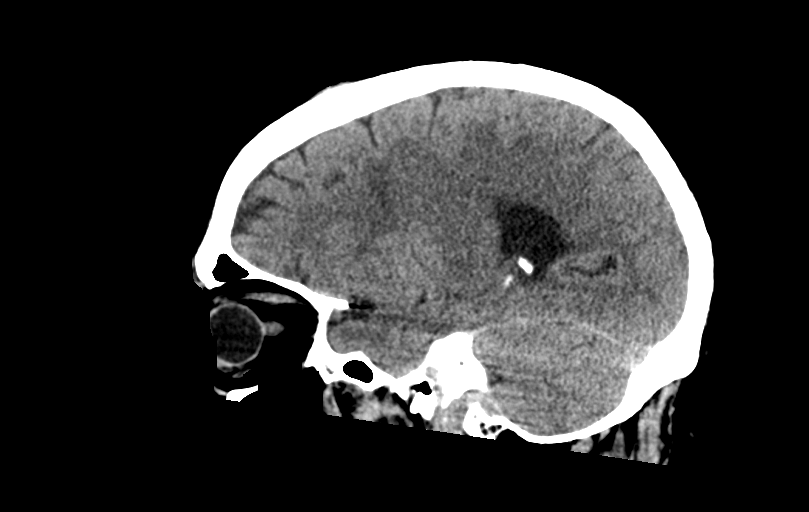

[14 of 47 positions shown; findings below may reference images not displayed]

FINDINGS: Brain: No evidence of acute intracranial hemorrhage or extra-axial
collection.Unchanged hypoattenuation within and surrounding the left
basal ganglia, as seen on prior MRI and CT. Unchanged biopsy tract
extending toward this region through the left frontal lobe.The
ventricles are unchanged in size.

Vascular: No hyperdense vessel or unexpected calcification.

Skull: Normal. Negative for fracture or focal lesion.

Sinuses/Orbits: Mild ethmoid air cell mucosal thickening.

Other: None.
IMPRESSION: No acute intracranial abnormality. Unchanged hypodensity within the
surrounding left basal ganglia, corresponding to the abnormality
seen on prior MRI and CT. Unchanged biopsy tract traversing the left
frontal lobe.

## 2021-05-24 MED ORDER — DILTIAZEM HCL ER COATED BEADS 120 MG PO CP24
120.0000 mg | ORAL_CAPSULE | Freq: Every day | ORAL | Status: DC
  Administered 2021-05-24: 120 mg via ORAL
  Filled 2021-05-24: qty 1

## 2021-05-24 MED ORDER — LIDOCAINE 5 % EX PTCH
1.0000 | MEDICATED_PATCH | CUTANEOUS | Status: DC
  Administered 2021-05-24: 1 via TRANSDERMAL
  Filled 2021-05-24: qty 1

## 2021-05-24 MED ORDER — MOLNUPIRAVIR EUA 200MG CAPSULE: 4.0000 | ORAL_CAPSULE | Freq: Two times a day (BID) | ORAL | 0 refills | Status: AC

## 2021-05-24 MED ORDER — AMLODIPINE BESYLATE 5 MG PO TABS
2.5000 mg | ORAL_TABLET | Freq: Two times a day (BID) | ORAL | Status: DC
  Administered 2021-05-24: 2.5 mg via ORAL
  Filled 2021-05-24: qty 1

## 2021-05-24 MED ORDER — ACETAMINOPHEN 325 MG PO TABS
650.0000 mg | ORAL_TABLET | Freq: Once | ORAL | Status: AC | PRN
  Administered 2021-05-24: 650 mg via ORAL
  Filled 2021-05-24: qty 2

## 2021-05-24 NOTE — ED Notes (Signed)
Patient transported to CT 

## 2021-05-24 NOTE — ED Notes (Signed)
Pt independently ambulating to and from rest room w/ a steady gait.  Pt reports feeling much better.  Weakness has subsided.

## 2021-05-24 NOTE — ED Triage Notes (Addendum)
Per EMS- patient is from home. Patient was ambulatory at the scene. Patient states he received an infusion for a neuro disorder 2 days ago at [facility]. Patient informed EMS that at times he has problems with his gait(right leg). Patient developed a fever today and his physician at [facility] wanted the patient seen for further evaluation due to receiving the infusion 2 days ago.  Patient added during triage that he was having intermittent right chest pain x 1 week.

## 2021-05-24 NOTE — ED Provider Notes (Signed)
[facility] DEPT Provider Note   CSN: [id number] Arrival date & time: 05/24/21  5093     History Chief Complaint  Patient presents with   Fever    Cory Mathis is a 70 y.o. 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 fib on diltiazem and apixaban, BPH status post TURP who presents the emergency department for evaluation of fever.  Patient called his neurologist this morning who recommended he come to the emergency department for fever evaluation as the patient is technically immunocompromised.  Patient states that he had a recent mechanical fall after attempting to walk too quickly after standing and had a ground-level fall with no head strike or loss of consciousness.  He fell on his right side and has not been evaluated by physician.  This fall was last week.  Patient states that last night he awoke 6 times to use the bathroom but had no dysuria or abnormal color of the urine.  He endorses right-sided chest pain   Fever     Past Medical History:  Diagnosis Date   Allergic rhinitis    Atrial fibrillation with rapid ventricular response (HCC)    Benign neoplasm of right choroid    BPH (benign prostatic hyperplasia)    Brain tumor (benign) (HCC)    Chest pain    Chronic headaches    Constipation    Diverticulosis of colon    Dysplastic nevus    Dysrhythmia 2019   Afib   Fatigue    Gastroesophageal reflux disease without esophagitis    GERD (gastroesophageal reflux disease)    prn med. 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   Hypertension    Hypothyroidism    Insomnia    Internal hemorrhoids    Left leg weakness    Memory loss    Nocturia    OSA on CPAP    CPAP nightly   Paresthesia of both feet    Retinal pigmentation    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HCC)    Right thyroid nodule    Sinus bradycardia    Urinary retention 12/13/2016    Patient Active Problem List   Diagnosis Date Noted   CNS vasculitis ([place]) 01/13/2021   Long term systemic steroid user 12/23/2020   Confusion 11/10/2020   Fall 10/13/2020   Abnormal brain MRI 10/13/2020   Abnormal blood chemistry 10/13/2020   Hemiparesis affecting right side as late effect of cerebrovascular accident ([place]) 09/28/2020   Lesion of left frontal lobe of brain 04/06/2020   Other persistent atrial fibrillation (HCC)    Bradycardia 02/15/2018   Agatston coronary artery calcium score between 100 and 199 02/15/2018   FUO (fever of unknown origin) 08/16/2017   Goiter 08/16/2017   Status post thyroidectomy 08/16/2017   Hypertension 08/16/2017   Dyslipidemia 08/16/2017   Obstructive sleep apnea 08/16/2017   Memory loss 07/25/2017   Enlarged prostate with urinary obstruction 01/04/2017   Prolapsed internal hemorrhoids, grade 3 09/13/2016   Eustachian tube dysfunction,  bilateral 05/19/2016    Past Surgical History:  Procedure Laterality Date   APPLICATION OF CRANIAL NAVIGATION N/A 04/06/2020   Procedure: APPLICATION OF CRANIAL NAVIGATION;  Surgeon: Judith Part, MD;  Location: [facility];  Service: Neurosurgery;  Laterality: N/A;   BRAIN SURGERY     CARDIOVERSION N/A 12/23/2018   Procedure: CARDIOVERSION;  Surgeon: Buford Dresser, MD;  Location: [facility] ENDOSCOPY;  Service: Cardiovascular;  Laterality: N/A;   CARDIOVERSION N/A 02/08/2021   Procedure: CARDIOVERSION;  Surgeon: Fay Records, MD;  Location: [facility];  Service: Cardiovascular;  Laterality: N/A;   COLONOSCOPY  last one 08-07-2016    EYE SURGERY     FRAMELESS  BIOPSY WITH BRAINLAB Left 04/06/2020   Procedure: Left Stereotactic brain biopsy with brainlab;  Surgeon: Judith Part, MD;  Location: [facility];  Service: Neurosurgery;  Laterality: Left;   HEMORRHOID BANDING  10-20-2016;  09-11-2016   HERNIA REPAIR     KNEE ARTHROSCOPY Bilateral    10 + yrs ago   LASIK Bilateral    PILONIDAL CYST EXCISION     THYROIDECTOMY Left 2013   TRANSURETHRAL RESECTION OF PROSTATE N/A 01/04/2017   Procedure: TRANSURETHRAL RESECTION OF THE PROSTATE (TURP);  Surgeon: Franchot Gallo, MD;  Location: [facility];  Service: Urology;  Laterality: N/A;   UPPER GASTROINTESTINAL ENDOSCOPY  10/30/2008   WISDOM TOOTH EXTRACTION         Family History  Problem Relation Age of Onset   Heart attack Mother        2's   Other Father        atherosclerosis - 65's   Prostate cancer Maternal Grandfather    Other Sister        brain tumor - unsure if it was cancer - 84's   Colon cancer Neg Hx    Esophageal cancer Neg Hx    Pancreatic cancer Neg Hx    Rectal cancer Neg Hx    Stomach cancer Neg Hx     Social History   Tobacco Use   Smoking status: Former   Smokeless tobacco: Never  Scientific laboratory technician Use: Never used  Substance Use Topics   Alcohol use: Yes    Comment: social    Drug use: No    Home Medications Prior to Admission medications   Medication Sig Start Date End Date Taking? Authorizing Provider  amLODipine (NORVASC) 5 MG tablet TAKE 1 TABLET(5 MG) BY MOUTH TWICE DAILY Patient taking differently: Take 5 mg by mouth 2 (two) times daily. 03/21/21  Yes Fay Records, MD  CYCLOPHOSPHAMIDE IV Inject into the vein. Inject once monthly   Yes [provider]  diltiazem (CARDIZEM CD) 120 MG 24 hr capsule Take 1 capsule (120 mg total) by mouth daily. 02/02/21 05/24/21 Yes TillerySatira Mccallum, PA-C  EPINEPHrine 0.3 mg/0.3 mL IJ SOAJ injection Inject 0.3 mg into the muscle as needed for anaphylaxis.  08/11/20  Yes [provider]  eszopiclone (LUNESTA) 2 MG TABS tablet Take 2 mg by mouth at bedtime. 05/26/20  Yes [provider]  famotidine (PEPCID) 20 MG tablet Take 1 tablet by mouth daily as needed for heartburn.   Yes [provider]  molnupiravir EUA (LAGEVRIO) 200 mg CAPS capsule Take 4 capsules (800 mg total) by mouth 2 (two) times daily for 5 days. 05/24/21 05/29/21 Yes Syleena Mchan, MD  predniSONE (DELTASONE) 10 MG tablet Take 30 mg by mouth daily. 04/09/21  Yes [provider]  rivaroxaban Alveda Reasons)  20 MG TABS tablet TAKE 1 TABLET(20 MG) BY MOUTH DAILY WITH SUPPER Patient taking differently: 20 mg daily with supper. 12/20/20  Yes Fay Records, MD  rosuvastatin (CRESTOR) 5 MG tablet TAKE 1/2 TABLET(2.5 MG) BY MOUTH DAILY Patient taking differently: Take 2.5 mg by mouth daily. 03/21/21  Yes Fay Records, MD  tetrahydrozoline 0.05 % ophthalmic solution Apply 1 drop to eye daily as needed (dry eyes).   Yes [provider]    Allergies    Other, Sulfa antibiotics, Sulfasalazine, and Flagyl [metronidazole]  Review of Systems   Review of Systems  Constitutional:  Positive for fever.   Physical Exam Updated Vital Signs BP (!) 124/57 (BP Location: Left Arm)    Pulse 86    Temp 99.8 F (37.7 C) (Oral)    Resp 18    Ht 5\' 9"  (1.753 m)    Wt 99.8 kg    SpO2 95%    BMI 32.49 kg/m   Physical Exam  ED Results / Procedures / Treatments   Labs (all labs ordered are listed, but only abnormal results are displayed) Labs Reviewed  RESP PANEL BY RT-PCR (FLU A&B, COVID) ARPGX2 - Abnormal; Notable for the following components:      Result Value   SARS Coronavirus 2 by RT PCR POSITIVE (*)    All other components within normal limits  COMPREHENSIVE METABOLIC PANEL - Abnormal; Notable for the following components:   Glucose, Bld 103 (*)    Calcium 8.7 (*)    Total Protein 6.3 (*)    All other components within normal limits  CBC WITH  DIFFERENTIAL/PLATELET - Abnormal; Notable for the following components:   RBC 3.91 (*)    HCT 38.0 (*)    Neutro Abs 9.2 (*)    Lymphs Abs 0.3 (*)    Abs Immature Granulocytes 0.16 (*)    All other components within normal limits  PROTIME-INR - Abnormal; Notable for the following components:   Prothrombin Time 20.7 (*)    INR 1.8 (*)    All other components within normal limits  APTT - Abnormal; Notable for the following components:   aPTT 37 (*)    All other components within normal limits  URINALYSIS, ROUTINE W REFLEX MICROSCOPIC - Abnormal; Notable for the following components:   Hgb urine dipstick MODERATE (*)    Ketones, ur 5 (*)    Bacteria, UA RARE (*)    All other components within normal limits  CULTURE, BLOOD (ROUTINE X 2)  CULTURE, BLOOD (ROUTINE X 2)  URINE CULTURE  LACTIC ACID, PLASMA    EKG None  Radiology CT Head Wo Contrast  Result Date: 05/24/2021 CLINICAL DATA:  Head trauma, moderate-severe fall, on thinners EXAM: CT HEAD WITHOUT CONTRAST TECHNIQUE: Contiguous axial images were obtained from the base of the skull through the vertex without intravenous contrast. COMPARISON:  MRI head 01/07/2021, CT head 10/28/2020 FINDINGS: Brain: No evidence of acute intracranial hemorrhage or extra-axial collection.Unchanged hypoattenuation within and surrounding the left basal ganglia, as seen on prior MRI and CT. Unchanged biopsy tract extending toward this region through the left frontal lobe.The ventricles are unchanged in size. Vascular: No hyperdense vessel or unexpected calcification. Skull: Normal. Negative for fracture or focal lesion. Sinuses/Orbits: Mild ethmoid air cell mucosal thickening. Other: None. IMPRESSION: No acute intracranial abnormality. Unchanged hypodensity within the surrounding left basal ganglia, corresponding to the abnormality seen on prior MRI and CT. Unchanged biopsy tract traversing the left frontal lobe. Electronically Signed  By: Maurine Simmering M.D.    On: 05/24/2021 11:39   CT Chest Wo Contrast  Result Date: 05/24/2021 CLINICAL DATA:  Rib fracture suspected, trauma.  Chest wall pain EXAM: CT CHEST WITHOUT CONTRAST TECHNIQUE: Multidetector CT imaging of the chest was performed following the standard protocol without IV contrast. COMPARISON:  Chest radiograph performed earlier on the same date FINDINGS: Cardiovascular: No significant vascular findings. Normal heart size. No pericardial effusion. Mediastinum/Nodes: No enlarged mediastinal or axillary lymph nodes. Right thyroid lobe protrudes into the thoracic inlet. Left thyroid lobe not visualized, correlate with prior history. Trachea, and esophagus demonstrate no significant findings. Lungs/Pleura: Lungs are clear. No pleural effusion or pneumothorax. Upper Abdomen: No acute abnormality. Musculoskeletal: No fracture is seen. IMPRESSION: 1.  No appreciable rib fracture. 2.  No CT evidence of acute intrathoracic abnormality. Electronically Signed   By: Keane Police D.O.   On: 05/24/2021 13:20   DG Chest Port 1 View  Result Date: 05/24/2021 CLINICAL DATA:  Questionable sepsis - evaluate for abnormality EXAM: PORTABLE CHEST 1 VIEW COMPARISON:  Radiograph 11/29/2018 FINDINGS: Unchanged cardiomediastinal silhouette. There is no new focal airspace disease. There is no large pleural effusion. No visible pneumothorax. There is no acute osseous abnormality. IMPRESSION: No evidence of acute cardiopulmonary disease. Electronically Signed   By: Maurine Simmering M.D.   On: 05/24/2021 11:35    Procedures Procedures   Medications Ordered in ED Medications  amLODipine (NORVASC) tablet 2.5 mg (2.5 mg Oral Given 05/24/21 1239)  diltiazem (CARDIZEM CD) 24 hr capsule 120 mg (120 mg Oral Given 05/24/21 1239)  lidocaine (LIDODERM) 5 % 1 patch (1 patch Transdermal Patch Applied 05/24/21 1332)  acetaminophen (TYLENOL) tablet 650 mg (650 mg Oral Given 05/24/21 1023)    ED Course  I have reviewed the triage vital signs  and the nursing notes.  Pertinent labs & imaging results that were available during my care of the patient were reviewed by me and considered in my medical decision making (see chart for details).    MDM Rules/Calculators/A&P                           Patient seen the emergency department for evaluation of fever and weakness.  Physical exam reveals mild right-sided rib tenderness but is otherwise unremarkable.  Laboratory evaluation largely unremarkable with no significant leukocytosis, urinalysis negative for infection, chest x-ray unremarkable.  CT head obtained in the setting of patient being on a blood thinner with a fall that was reassuringly negative.  A chest CT was ultimately performed to rule out rib fracture which was reassuringly negative.  Initial lactate normal at 0.8, fever improved with Tylenol.  Weakness also improved with Tylenol.  Patient COVID-positive.  Likely the source of the patient's fever.  Patient not a candidate for paxlovid as he is on Xarelto.  Thus, patient prescribed Molnupiravir.  Patient then discharged with PCP follow-up. Final Clinical Impression(s) / ED Diagnoses Final diagnoses:  COVID-19    Rx / DC Orders ED Discharge Orders          Ordered    molnupiravir EUA (LAGEVRIO) 200 mg CAPS capsule  2 times daily        05/24/21 1358             Steffen Hase, Debe Coder, MD 05/24/21 1419

## 2021-05-25 LAB — URINE CULTURE: Culture: NO GROWTH

## 2021-05-29 DIAGNOSIS — Z20822 Contact with and (suspected) exposure to covid-19: Secondary | ICD-10-CM | POA: Diagnosis not present

## 2021-05-29 LAB — CULTURE, BLOOD (ROUTINE X 2)
Culture: NO GROWTH
Culture: NO GROWTH
Special Requests: ADEQUATE
Special Requests: ADEQUATE

## 2021-06-01 ENCOUNTER — Other Ambulatory Visit: Payer: Self-pay

## 2021-06-01 ENCOUNTER — Ambulatory Visit (HOSPITAL_COMMUNITY)
Admission: RE | Admit: 2021-06-01 | Discharge: 2021-06-01 | Disposition: A | Discharge status: Home / Self Care | Payer: Medicare Other | Source: Ambulatory Visit | Attending: Family Medicine | Admitting: Family Medicine

## 2021-06-01 ENCOUNTER — Other Ambulatory Visit (HOSPITAL_COMMUNITY): Payer: Self-pay | Admitting: Family Medicine

## 2021-06-01 DIAGNOSIS — R051 Acute cough: Secondary | ICD-10-CM | POA: Diagnosis not present

## 2021-06-01 DIAGNOSIS — R5383 Other fatigue: Secondary | ICD-10-CM | POA: Diagnosis not present

## 2021-06-01 DIAGNOSIS — R059 Cough, unspecified: Secondary | ICD-10-CM | POA: Diagnosis not present

## 2021-06-01 DIAGNOSIS — U071 COVID-19: Secondary | ICD-10-CM | POA: Diagnosis not present

## 2021-06-01 IMAGING — DX DG CHEST 2V
2 series · 2 of 2 positions shown · non-contrast
Comparison: CT chest and chest x-ray dated [DATE].

CLINICAL DATA: Cough and fatigue.

EXAM:
CHEST - 2 VIEW

[chest pa]
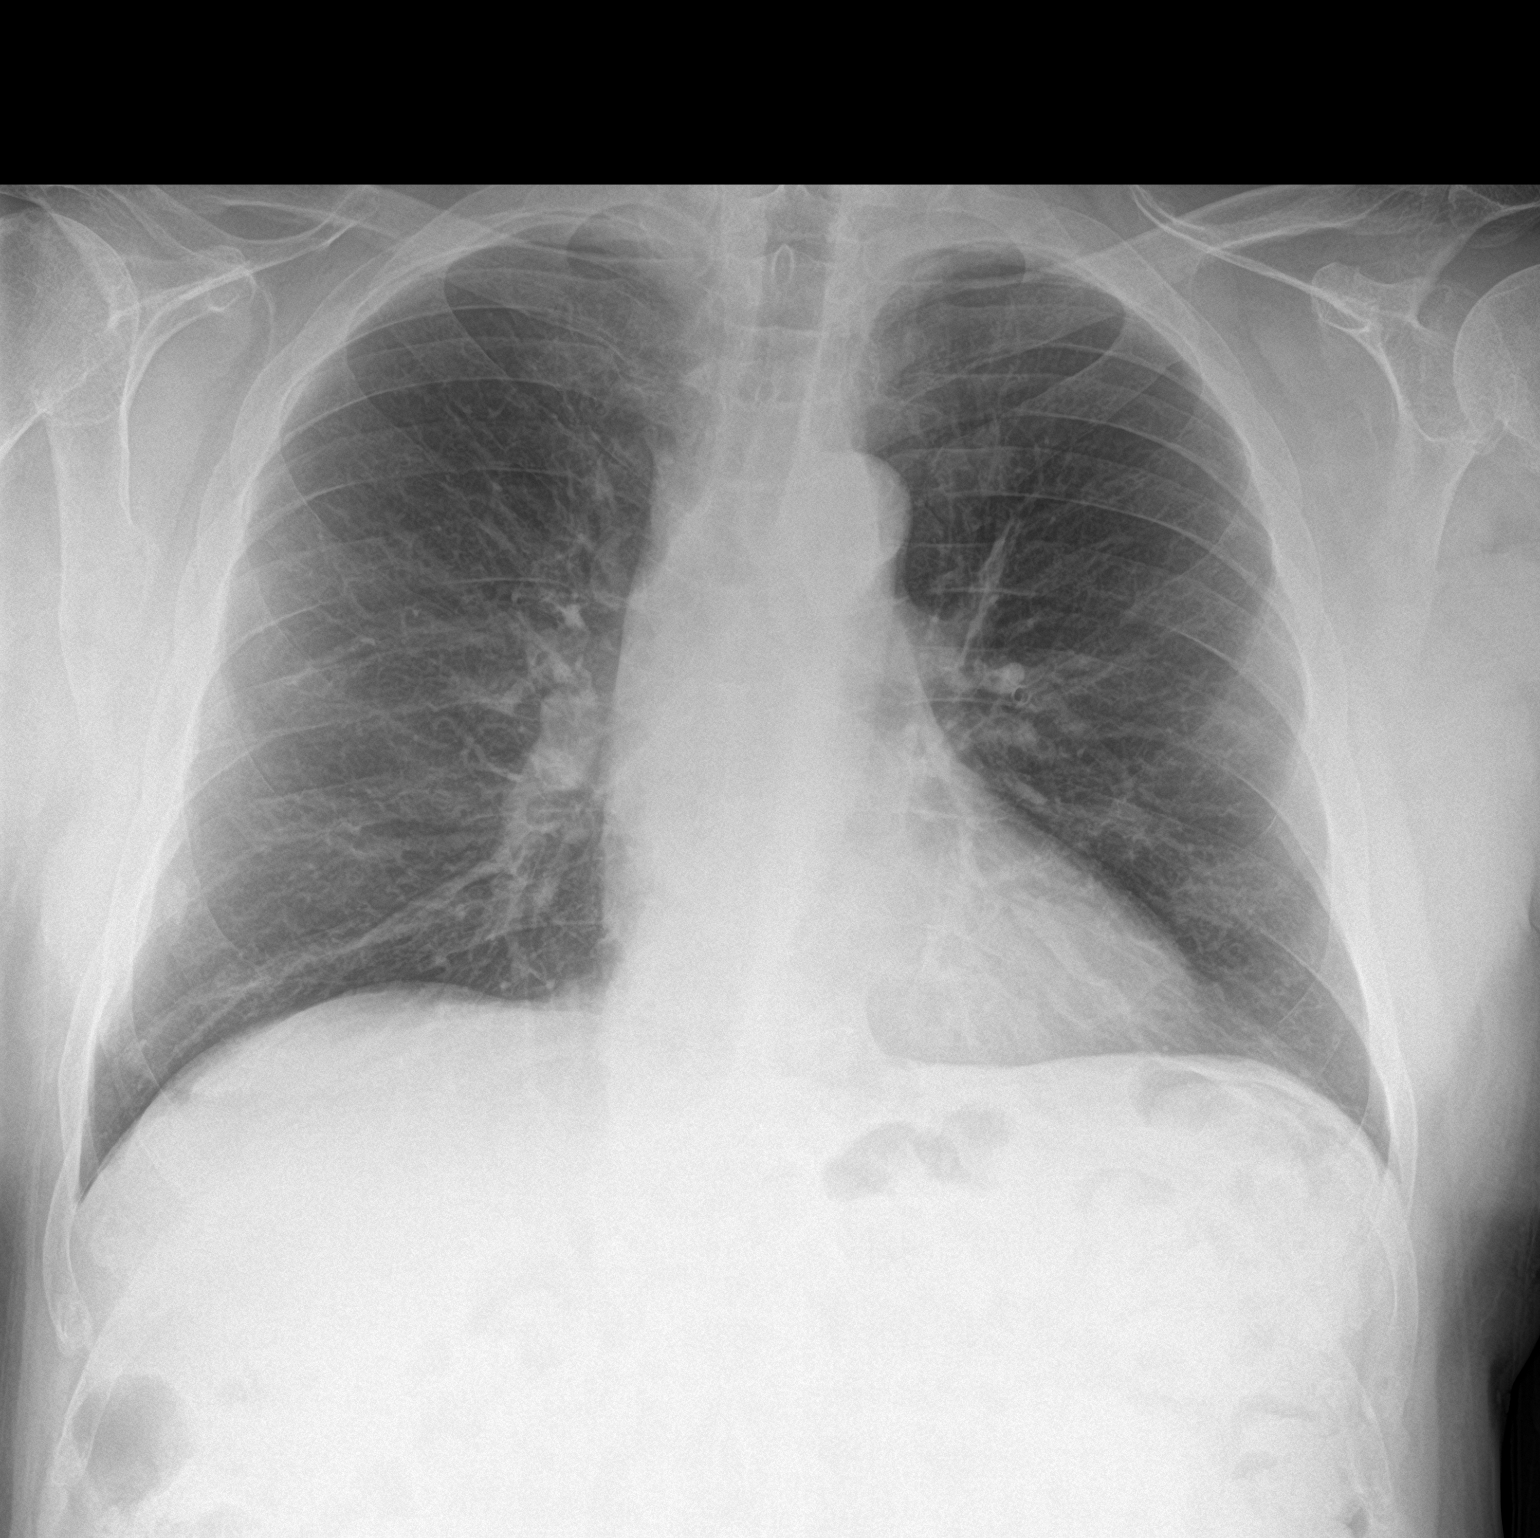

[chest lat]
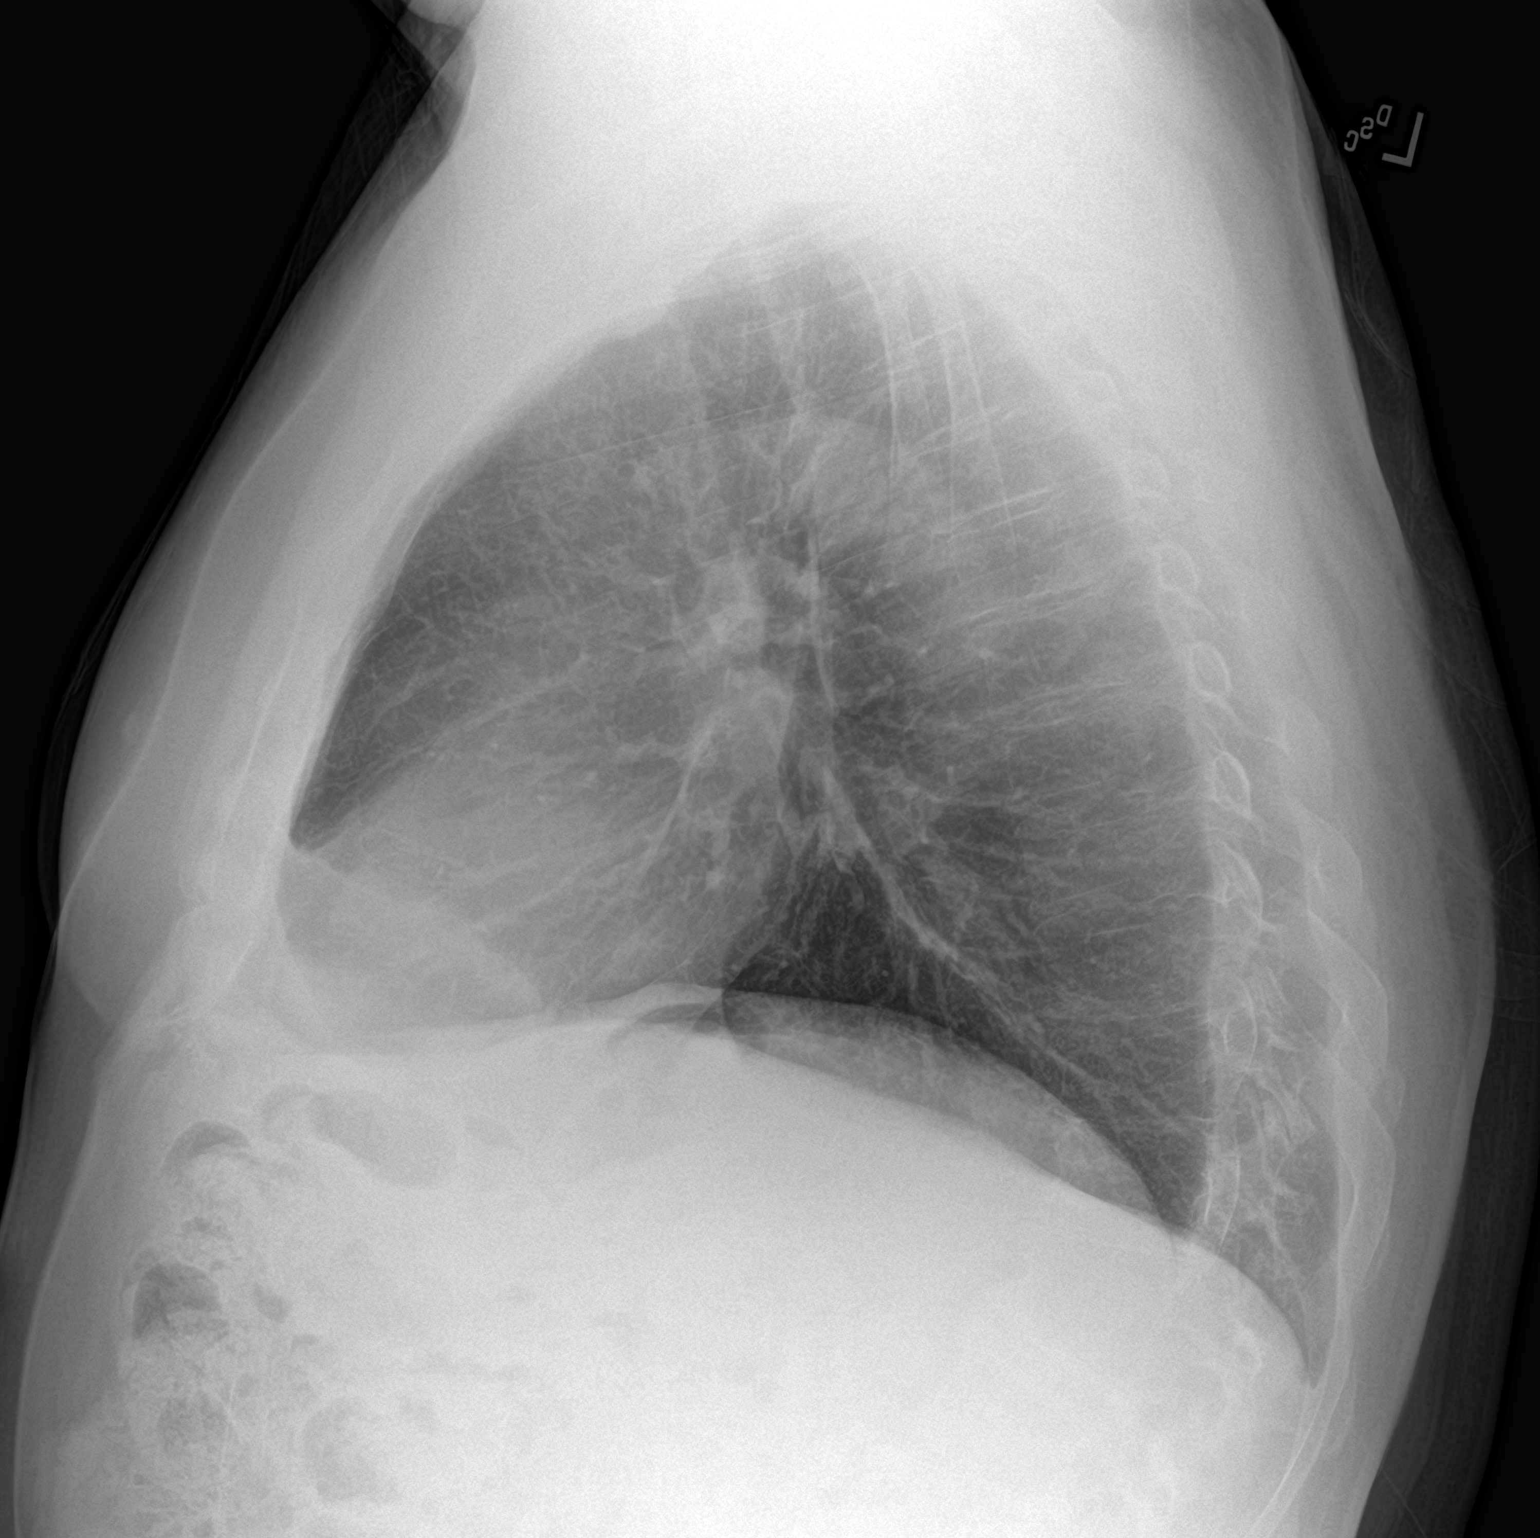

[2 of 2 positions shown; findings below may reference images not displayed]

FINDINGS: The heart size and mediastinal contours are within normal limits.
Both lungs are clear. The visualized skeletal structures are
unremarkable.
IMPRESSION: No active cardiopulmonary disease.

## 2021-06-07 DIAGNOSIS — J301 Allergic rhinitis due to pollen: Secondary | ICD-10-CM | POA: Diagnosis not present

## 2021-06-07 DIAGNOSIS — J3089 Other allergic rhinitis: Secondary | ICD-10-CM | POA: Diagnosis not present

## 2021-06-09 DIAGNOSIS — G939 Disorder of brain, unspecified: Secondary | ICD-10-CM | POA: Diagnosis not present

## 2021-06-09 DIAGNOSIS — I776 Arteritis, unspecified: Secondary | ICD-10-CM | POA: Diagnosis not present

## 2021-06-10 DIAGNOSIS — I675 Moyamoya disease: Secondary | ICD-10-CM | POA: Diagnosis not present

## 2021-06-10 DIAGNOSIS — I776 Arteritis, unspecified: Secondary | ICD-10-CM | POA: Diagnosis not present

## 2021-06-14 DIAGNOSIS — J301 Allergic rhinitis due to pollen: Secondary | ICD-10-CM | POA: Diagnosis not present

## 2021-06-14 DIAGNOSIS — J3089 Other allergic rhinitis: Secondary | ICD-10-CM | POA: Diagnosis not present

## 2021-06-16 ENCOUNTER — Ambulatory Visit: Payer: Medicare Other | Admitting: Neurology

## 2021-06-17 ENCOUNTER — Other Ambulatory Visit: Payer: Self-pay | Admitting: Internal Medicine

## 2021-06-17 DIAGNOSIS — I480 Paroxysmal atrial fibrillation: Secondary | ICD-10-CM

## 2021-06-17 NOTE — Telephone Encounter (Signed)
Xarelto 20mg  refill request received. Pt is 71 years old, weight-99.8kg, Crea-1.03 on 05/24/2021, last seen by Oda Kilts on 02/02/2021 & Roderic Palau on 02/15/2021, Diagnosis-Afib, CrCl-92.15ml/min; Dose is appropriate based on dosing criteria. Will send in refill to requested pharmacy.

## 2021-06-21 ENCOUNTER — Encounter: Payer: Medicare Other | Admitting: Gastroenterology

## 2021-06-21 DIAGNOSIS — I776 Arteritis, unspecified: Secondary | ICD-10-CM | POA: Diagnosis not present

## 2021-06-23 DIAGNOSIS — R350 Frequency of micturition: Secondary | ICD-10-CM | POA: Diagnosis not present

## 2021-06-23 DIAGNOSIS — I776 Arteritis, unspecified: Secondary | ICD-10-CM | POA: Diagnosis not present

## 2021-06-23 DIAGNOSIS — R3589 Other polyuria: Secondary | ICD-10-CM | POA: Diagnosis not present

## 2021-06-24 DIAGNOSIS — I776 Arteritis, unspecified: Secondary | ICD-10-CM | POA: Diagnosis not present

## 2021-06-24 DIAGNOSIS — E039 Hypothyroidism, unspecified: Secondary | ICD-10-CM | POA: Diagnosis not present

## 2021-06-24 DIAGNOSIS — G4730 Sleep apnea, unspecified: Secondary | ICD-10-CM | POA: Diagnosis not present

## 2021-06-24 DIAGNOSIS — G8191 Hemiplegia, unspecified affecting right dominant side: Secondary | ICD-10-CM | POA: Diagnosis not present

## 2021-06-24 DIAGNOSIS — Z5181 Encounter for therapeutic drug level monitoring: Secondary | ICD-10-CM | POA: Diagnosis not present

## 2021-06-24 DIAGNOSIS — J3089 Other allergic rhinitis: Secondary | ICD-10-CM | POA: Diagnosis not present

## 2021-06-24 DIAGNOSIS — J301 Allergic rhinitis due to pollen: Secondary | ICD-10-CM | POA: Diagnosis not present

## 2021-06-24 DIAGNOSIS — I4891 Unspecified atrial fibrillation: Secondary | ICD-10-CM | POA: Diagnosis not present

## 2021-06-24 DIAGNOSIS — E7800 Pure hypercholesterolemia, unspecified: Secondary | ICD-10-CM | POA: Diagnosis not present

## 2021-06-24 DIAGNOSIS — R3129 Other microscopic hematuria: Secondary | ICD-10-CM | POA: Diagnosis not present

## 2021-06-28 DIAGNOSIS — J3089 Other allergic rhinitis: Secondary | ICD-10-CM | POA: Diagnosis not present

## 2021-06-28 DIAGNOSIS — J301 Allergic rhinitis due to pollen: Secondary | ICD-10-CM | POA: Diagnosis not present

## 2021-06-29 DIAGNOSIS — E039 Hypothyroidism, unspecified: Secondary | ICD-10-CM | POA: Diagnosis not present

## 2021-06-29 DIAGNOSIS — R3129 Other microscopic hematuria: Secondary | ICD-10-CM | POA: Diagnosis not present

## 2021-06-29 DIAGNOSIS — E7800 Pure hypercholesterolemia, unspecified: Secondary | ICD-10-CM | POA: Diagnosis not present

## 2021-07-06 DIAGNOSIS — R3915 Urgency of urination: Secondary | ICD-10-CM | POA: Diagnosis not present

## 2021-07-06 DIAGNOSIS — N401 Enlarged prostate with lower urinary tract symptoms: Secondary | ICD-10-CM | POA: Diagnosis not present

## 2021-07-06 DIAGNOSIS — R350 Frequency of micturition: Secondary | ICD-10-CM | POA: Diagnosis not present

## 2021-07-06 DIAGNOSIS — N5201 Erectile dysfunction due to arterial insufficiency: Secondary | ICD-10-CM | POA: Diagnosis not present

## 2021-07-06 DIAGNOSIS — R351 Nocturia: Secondary | ICD-10-CM | POA: Diagnosis not present

## 2021-07-07 DIAGNOSIS — G939 Disorder of brain, unspecified: Secondary | ICD-10-CM | POA: Diagnosis not present

## 2021-07-07 DIAGNOSIS — I776 Arteritis, unspecified: Secondary | ICD-10-CM | POA: Diagnosis not present

## 2021-07-07 DIAGNOSIS — Z79899 Other long term (current) drug therapy: Secondary | ICD-10-CM | POA: Diagnosis not present

## 2021-07-08 ENCOUNTER — Ambulatory Visit: Payer: Medicare Other | Admitting: Cardiology

## 2021-07-08 DIAGNOSIS — J301 Allergic rhinitis due to pollen: Secondary | ICD-10-CM | POA: Diagnosis not present

## 2021-07-08 DIAGNOSIS — J3089 Other allergic rhinitis: Secondary | ICD-10-CM | POA: Diagnosis not present

## 2021-07-19 DIAGNOSIS — J301 Allergic rhinitis due to pollen: Secondary | ICD-10-CM | POA: Diagnosis not present

## 2021-07-19 DIAGNOSIS — J3089 Other allergic rhinitis: Secondary | ICD-10-CM | POA: Diagnosis not present

## 2021-07-20 DIAGNOSIS — L821 Other seborrheic keratosis: Secondary | ICD-10-CM | POA: Diagnosis not present

## 2021-07-20 DIAGNOSIS — D2272 Melanocytic nevi of left lower limb, including hip: Secondary | ICD-10-CM | POA: Diagnosis not present

## 2021-07-21 ENCOUNTER — Ambulatory Visit (INDEPENDENT_AMBULATORY_CARE_PROVIDER_SITE_OTHER): Payer: Medicare Other | Admitting: Gastroenterology

## 2021-07-21 ENCOUNTER — Encounter: Payer: Self-pay | Admitting: Gastroenterology

## 2021-07-21 VITALS — BP 132/64 | HR 62 | Ht 69.0 in | Wt 225.0 lb

## 2021-07-21 DIAGNOSIS — K648 Other hemorrhoids: Secondary | ICD-10-CM | POA: Diagnosis not present

## 2021-07-21 DIAGNOSIS — K642 Third degree hemorrhoids: Secondary | ICD-10-CM

## 2021-07-21 NOTE — Progress Notes (Signed)
PROCEDURE NOTE: The patient presents with symptomatic bleeding grade III hemorrhoids, requesting rubber band ligation of their hemorrhoidal disease.  All risks, benefits and alternative forms of therapy were described and informed consent was obtained. The patient is maintained on Xarelto which was continued for banding -  refer to the 04/20/2021 office note.    LL and RP hemorrhoids were banded previously. He reports no bleeding or prolapse.   The anorectum was pre-medicated during digital exam with 0.125% NTG and 5% lidocaine.  The decision was made to band the RA internal hemorrhoid, and the [place] was used to perform band ligation without complication.  Digital anorectal examination was then performed to assure proper positioning of the band and to adjust the banded tissue as required. No adjustment was needed.  No complications were encountered and the patient tolerated the procedure well.  Dietary and behavioral recommendations were given and along with follow-up instructions.   High fiber diet, Benefiber daily and at least 8 glasses of water daily. Add Colace qd and increase Miralax to 2-3 scoops daily.  The patient was discharged home without pain or other post procedure problems.

## 2021-07-21 NOTE — Patient Instructions (Signed)
Increase your Miralax to 2-3 x daily and add colace daily.   HEMORRHOID BANDING PROCEDURE    FOLLOW-UP CARE   The procedure you have had should have been relatively painless since the banding of the area involved does not have nerve endings and there is no pain sensation.  The rubber band cuts off the blood supply to the hemorrhoid and the band may fall off as soon as 48 hours after the banding (the band may occasionally be seen in the toilet bowl following a bowel movement). You may notice a temporary feeling of fullness in the rectum which should respond adequately to plain Tylenol or Motrin.  Following the banding, avoid strenuous exercise that evening and resume full activity the next day.  A sitz bath (soaking in a warm tub) or bidet is soothing, and can be useful for cleansing the area after bowel movements.     To avoid constipation, take two tablespoons of natural wheat bran, natural oat bran, flax, Benefiber or any over the counter fiber supplement and increase your water intake to 7-8 glasses daily.    Unless you have been prescribed anorectal medication, do not put anything inside your rectum for two weeks: No suppositories, enemas, fingers, etc.  Occasionally, you may have more bleeding than usual after the banding procedure.  This is often from the untreated hemorrhoids rather than the treated one.  Dont be concerned if there is a tablespoon or so of blood.  If there is more blood than this, lie flat with your bottom higher than your head and apply an ice pack to the area. If the bleeding does not stop within a half an hour or if you feel faint, call our office at [phone] or go to the emergency room.  Problems are not common; however, if there is a substantial amount of bleeding, severe pain, chills, fever or difficulty passing urine (very rare) or other problems, you should call us at [phone] or report to the nearest emergency room.  Do not stay seated  continuously for more than 2-3 hours for a day or two after the procedure.  Tighten your buttock muscles 10-15 times every two hours and take 10-15 deep breaths every 1-2 hours.  Do not spend more than a few minutes on the toilet if you cannot empty your bowel; instead re-visit the toilet at a later time.    Thank you for choosing me and [facility].  Pricilla Riffle. Dagoberto Ligas., MD., Marval Regal

## 2021-07-29 DIAGNOSIS — I776 Arteritis, unspecified: Secondary | ICD-10-CM | POA: Diagnosis not present

## 2021-08-01 DIAGNOSIS — J3089 Other allergic rhinitis: Secondary | ICD-10-CM | POA: Diagnosis not present

## 2021-08-01 DIAGNOSIS — J301 Allergic rhinitis due to pollen: Secondary | ICD-10-CM | POA: Diagnosis not present

## 2021-08-04 DIAGNOSIS — G939 Disorder of brain, unspecified: Secondary | ICD-10-CM | POA: Diagnosis not present

## 2021-08-04 DIAGNOSIS — Z79899 Other long term (current) drug therapy: Secondary | ICD-10-CM | POA: Diagnosis not present

## 2021-08-04 DIAGNOSIS — I776 Arteritis, unspecified: Secondary | ICD-10-CM | POA: Diagnosis not present

## 2021-08-08 DIAGNOSIS — Z20822 Contact with and (suspected) exposure to covid-19: Secondary | ICD-10-CM | POA: Diagnosis not present

## 2021-08-09 ENCOUNTER — Telehealth: Payer: Self-pay | Admitting: *Deleted

## 2021-08-09 ENCOUNTER — Telehealth: Payer: Self-pay

## 2021-08-09 NOTE — Telephone Encounter (Signed)
Amanda please request clearance for a 2 day Xarelto hold for colonoscopy from his prescribing provider without another GI office appt since he was seen here in 04/2021.

## 2021-08-09 NOTE — Telephone Encounter (Signed)
I did not schedule yet until I knew if Dr Fuller Plan wanted an OV prior- I was doing some recalls for [facility] -  you  can call or I can, you just let me know    thx  Cory Mathis OV

## 2021-08-09 NOTE — Telephone Encounter (Signed)
[facility] Pre-operative Risk Assessment     Request for surgical clearance:     Endoscopy Procedure  What type of surgery is being performed?     colonoscopy  When is this surgery scheduled?     TBD  What type of clearance is required ?   Pharmacy  Are there any medications that need to be held prior to surgery and how long? Xarelto x 2 days  Practice name and name of physician performing surgery?      [facility]  What is your office phone and fax number?      Phone- [phone]  [phone]  Anesthesia type (None, local, MAC, general) ?       MAC

## 2021-08-09 NOTE — Telephone Encounter (Signed)
Sent clearance letter to Cardiology for a 2 day hold of Xarelto. I didn't see that the procedure has been scheduled. Are you scheduling the procedure and telephone pre-visit or do I need to?

## 2021-08-09 NOTE — Telephone Encounter (Signed)
Dr Fuller Plan,  This pt needs to be scheduled for recall colon for a hx of TA polyps. He is on Xarelto- he saw you in the office 04-20-2021 for prolapsed hemorrhoids and has had banding while on Xarelto x 3   Does he need another OV with you or an APP for Xarelto hold or can we send the hold  without  another OV ?  Please advise, Thanks   Lelan Pons PV

## 2021-08-10 NOTE — Telephone Encounter (Signed)
Patient with diagnosis of A Fib on Xarelto for anticoagulation.   ? ?Procedure: colonoscopy ?Date of procedure: TBD ? ? ?CHA2DS2-VASc Score = 3  ?This indicates a 3.2% annual risk of stroke. ?The patient's score is based upon: ?CHF History: 0 ?HTN History: 1 ?Diabetes History: 0 ?Stroke History: 0 ?Vascular Disease History: 1 ?Age Score: 1 ?Gender Score: 0 ?  ?CrCl 80 mL/min ?Platelet count 256K ? ?Per office protocol, patient can hold Xarelto for 2 days prior to procedure.   ?

## 2021-08-10 NOTE — Telephone Encounter (Signed)
Patient can hold Xarelto 2 days prior to colonoscopy. Thanks Lelan Pons. ?

## 2021-08-10 NOTE — Telephone Encounter (Signed)
Cory Mathis, that would be great if you can schedule direct colon and pre-visit. I have sent the clearance and I will follow up and let you know afterwards. Thanks. ?

## 2021-08-11 ENCOUNTER — Encounter: Payer: Self-pay | Admitting: Gastroenterology

## 2021-08-11 DIAGNOSIS — J3089 Other allergic rhinitis: Secondary | ICD-10-CM | POA: Diagnosis not present

## 2021-08-11 DIAGNOSIS — J301 Allergic rhinitis due to pollen: Secondary | ICD-10-CM | POA: Diagnosis not present

## 2021-08-11 NOTE — Telephone Encounter (Signed)
Ok thanks 

## 2021-08-11 NOTE — Telephone Encounter (Signed)
Called pt to schedule a colon with Dr Fuller Plan and a PV- LM to return call to schedule   Cory Mathis  ?

## 2021-08-11 NOTE — Telephone Encounter (Signed)
Patient returned call, and was scheduled for PV call on 5/3 at 3:00 and colon procedure on 5/24 at 8:00.  ?

## 2021-08-16 DIAGNOSIS — R351 Nocturia: Secondary | ICD-10-CM | POA: Diagnosis not present

## 2021-08-16 DIAGNOSIS — N401 Enlarged prostate with lower urinary tract symptoms: Secondary | ICD-10-CM | POA: Diagnosis not present

## 2021-08-16 DIAGNOSIS — R3915 Urgency of urination: Secondary | ICD-10-CM | POA: Diagnosis not present

## 2021-08-21 DIAGNOSIS — Z20828 Contact with and (suspected) exposure to other viral communicable diseases: Secondary | ICD-10-CM | POA: Diagnosis not present

## 2021-08-21 NOTE — Progress Notes (Deleted)
02/02/2021 visit with Cory Mathis ?persAF on xarelto ?DCCV in 2020 ? ?HTN ? ?Seizures ?Brain mass ? ?wants to discuss rhythm control given desire to avoid AADs ?

## 2021-08-23 ENCOUNTER — Encounter: Payer: Self-pay | Admitting: Cardiology

## 2021-08-23 ENCOUNTER — Ambulatory Visit (INDEPENDENT_AMBULATORY_CARE_PROVIDER_SITE_OTHER): Payer: Medicare Other | Admitting: Cardiology

## 2021-08-23 ENCOUNTER — Other Ambulatory Visit: Payer: Self-pay

## 2021-08-23 VITALS — BP 120/72 | HR 65 | Ht 69.0 in | Wt 226.0 lb

## 2021-08-23 DIAGNOSIS — Z7901 Long term (current) use of anticoagulants: Secondary | ICD-10-CM

## 2021-08-23 DIAGNOSIS — I4819 Other persistent atrial fibrillation: Secondary | ICD-10-CM | POA: Diagnosis not present

## 2021-08-23 NOTE — Patient Instructions (Signed)
Medication Instructions:  ?Your physician recommends that you continue on your current medications as directed. Please refer to the Current Medication list given to you today. ? ?Please take your Xarelto in the morning with food. ? ?*If you need a refill on your cardiac medications before your next appointment, please call your pharmacy* ? ? ?Lab Work: ?None ordered. ? ?If you have labs (blood work) drawn today and your tests are completely normal, you will receive your results only by: ?MyChart Message (if you have MyChart) OR ?A paper copy in the mail ?If you have any lab test that is abnormal or we need to change your treatment, we will call you to review the results. ? ? ?Testing/Procedures: ?None ordered. ? ? ? ?Follow-Up: ?At [facility], you and your health needs are our priority.  As part of our continuing mission to provide you with exceptional heart care, we have created designated Provider Care Teams.  These Care Teams include your primary Cardiologist (physician) and Advanced Practice Providers (APPs -  Physician Assistants and Nurse Practitioners) who all work together to provide you with the care you need, when you need it. ? ?We recommend signing up for the patient portal called "MyChart".  Sign up information is provided on this After Visit Summary.  MyChart is used to connect with patients for Virtual Visits (Telemedicine).  Patients are able to view lab/test results, encounter notes, upcoming appointments, etc.  Non-urgent messages can be sent to your provider as well.   ?To learn more about what you can do with MyChart, go to [email or web address].   ? ?Your next appointment:   ?6 month(s) ? ?The format for your next appointment:   ?In Person ? ?Provider:   ?You will see one of the following Advanced Practice Providers on your designated Care Team:   ?Tommye Standard, PA-C ?Legrand Como "Jonni Sanger" [place], PA-C ? ? ?

## 2021-08-23 NOTE — Progress Notes (Signed)
?Electrophysiology Office Note:   ? ?Date:  08/23/2021  ? ?ID:  Cory Mathis, DOB 04/26/1951, MRN [id number] ? ?PCP:  Vernie Shanks, MD  ?[facility] Cardiologist:  Dorris Carnes, MD  ?[facility] Electrophysiologist:  Vickie Epley, MD  ? ?Referring MD: Vernie Shanks, MD  ? ?Chief Complaint: Follow-up ? ?History of Present Illness:   ? ?Cory Mathis is a 71 y.o. male who presents for follow-up. 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 ? ?He was seen in the ED 05/24/2021 for evaluation of fever and right sided chest pain. He also reported a mechanical fall onto his right side one week prior. He was found to be COVID positive and prescribed Molnupiravir. ? ?They were last seen by Dr. Chalmers Cater 02/02/2021. At that visit Cory Mathis reported arrhythmia per his smart watch for 3 days. He had no specific symptoms. A cardioversion was performed 02/08/2021. ? ?In 10/2020 he had a contrast reaction during a CT scan and developed Atrial fibrillation. His diltiazem was increased to 30 mg q 6 hours. ? ?He was seen by neurology in 2021 for a deep L frontal mass seen on CT and MRI. He was prescribed steroids. Then developed seizures. On Keppra. ? ?Cory Mathis had been started on cardiazem and Xarelto in 11/2018. Prior DCCV performed in 2020.  ? ?Overall, he appears well. 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 In two weeks he will receive his 6th and final infusion (over the past 6 months).  ? ?No recurrent episodes of arrhythmia recently. He states his last episode was about 2 years ago. ? ?He is compliant with Xarelto, but does not take it with food. Typically he will only eat lunch or dinner. He does eat breakfast consistently. ? ?He denies any palpitations, chest pain, shortness of breath, or peripheral edema. No  lightheadedness, headaches, syncope, orthopnea, or PND. ? ? ?  ?Past Medical History:  ?Diagnosis Date  ? Allergic rhinitis   ? Atrial fibrillation with rapid ventricular response ([place])   ? Benign neoplasm of right choroid   ? BPH (benign prostatic hyperplasia)   ? Brain tumor (benign) ([place])   ? Chest pain   ? Chronic headaches   ? Constipation   ? Diverticulosis of colon   ? Dysplastic nevus   ? Dysrhythmia 2019  ? Afib  ? Fatigue   ? Gastroesophageal reflux disease without esophagitis   ? GERD (gastroesophageal reflux disease)   ? prn med. control  ? H/O thyroidectomy   ? Headache   ? History of adenomatous polyp of colon   ? 03-29-2007  tubular adenoma/  08-07-2016  hyperplastic and tubular adenoma's  ? History of alcoholic gastritis   ? 10-30-2008  gastritis, esophagitis, peptic duodenitis  ? History of colonic diverticulitis   ? 01-31-2016  resolved w/ medications  ? History of Helicobacter pylori infection   ? 10-30-2008  ? History of thyroid nodule   ? 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 Hypertension   ? Hypothyroidism   ? Insomnia   ? Internal hemorrhoids   ? Left leg weakness   ? Memory loss   ? Nocturia   ? OSA on CPAP   ? CPAP nightly  ? Paresthesia of both feet   ? Retinal pigmentation   ? 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  Right thyroid nodule   ? Sinus bradycardia   ? Urinary retention 12/13/2016  ? ? ?Past Surgical History:  ?Procedure Laterality Date  ? APPLICATION OF CRANIAL NAVIGATION N/A 04/06/2020  ? Procedure: APPLICATION OF CRANIAL NAVIGATION;  Surgeon: Judith Part, MD;  Location: [place];  Service: Neurosurgery;  Laterality: N/A;  ? BRAIN SURGERY    ? CARDIOVERSION N/A 12/23/2018  ? Procedure: CARDIOVERSION;  Surgeon: Buford Dresser, MD;  Location: [place];  Service: Cardiovascular;  Laterality: N/A;  ? CARDIOVERSION N/A 02/08/2021  ? Procedure: CARDIOVERSION;  Surgeon: Fay Records, MD;  Location: [facility] ENDOSCOPY;  Service: Cardiovascular;  Laterality: N/A;   ? COLONOSCOPY  last one 08-07-2016  ? EYE SURGERY    ? FRAMELESS  BIOPSY WITH BRAINLAB Left 04/06/2020  ? Procedure: Left Stereotactic brain biopsy with brainlab;  Surgeon: Judith Part, MD;  Location: [place];  Service: Neurosurgery;  Laterality: Left;  ? HEMORRHOID BANDING  10-20-2016;  09-11-2016  ? HERNIA REPAIR    ? KNEE ARTHROSCOPY Bilateral   ? 10 + yrs ago  ? LASIK Bilateral   ? PILONIDAL CYST EXCISION    ? THYROIDECTOMY Left 2013  ? TRANSURETHRAL RESECTION OF PROSTATE N/A 01/04/2017  ? Procedure: TRANSURETHRAL RESECTION OF THE PROSTATE (TURP);  Surgeon: Franchot Gallo, MD;  Location: [facility];  Service: Urology;  Laterality: N/A;  ? UPPER GASTROINTESTINAL ENDOSCOPY  10/30/2008  ? WISDOM TOOTH EXTRACTION    ? ? ?Current Medications: ?Current Meds  ?Medication Sig  ? amLODipine (NORVASC) 5 MG tablet TAKE 1 TABLET(5 MG) BY MOUTH TWICE DAILY (Patient taking differently: Take 5 mg by mouth 2 (two) times daily.)  ? CYCLOPHOSPHAMIDE IV Inject into the vein. Inject once monthly  ? diltiazem (CARDIZEM CD) 120 MG 24 hr capsule Take 1 capsule (120 mg total) by mouth daily.  ? EPINEPHrine 0.3 mg/0.3 mL IJ SOAJ injection Inject 0.3 mg into the muscle as needed for anaphylaxis.  ? eszopiclone (LUNESTA) 2 MG TABS tablet Take 2 mg by mouth at bedtime.  ? famotidine (PEPCID) 20 MG tablet Take 1 tablet by mouth daily as needed for heartburn.  ? GEMTESA 75 MG TABS Take 1 tablet by mouth daily.  ? predniSONE (DELTASONE) 10 MG tablet Take 30 mg by mouth daily.  ? rosuvastatin (CRESTOR) 5 MG tablet TAKE 1/2 TABLET(2.5 MG) BY MOUTH DAILY (Patient taking differently: Take 2.5 mg by mouth daily.)  ? tetrahydrozoline 0.05 % ophthalmic solution Apply 1 drop to eye daily as needed (dry eyes).  ? VITAMIN D PO   ? XARELTO 20 MG TABS tablet TAKE 1 TABLET(20 MG) BY MOUTH DAILY WITH SUPPER  ? [DISCONTINUED] amLODipine (NORVASC) 5 MG tablet Take by mouth.  ?  ? ?Allergies:   Other, Sulfa antibiotics,  Sulfasalazine, and Flagyl [metronidazole]  ? ?Social History  ? ?Socioeconomic History  ? Marital status: Married  ?  Spouse name: Not on file  ? Number of children: 0  ? Years of education: 16+  ? Highest education level: Bachelor's degree (e.g., BA, AB, BS)  ?Occupational History  ? Occupation: Retired  ?Tobacco Use  ? Smoking status: Former  ? Smokeless tobacco: Never  ?Vaping Use  ? Vaping Use: Never used  ?Substance and Sexual Activity  ? Alcohol use: Yes  ?  Comment: social   ? Drug use: No  ? Sexual activity: Not on file  ?Other Topics Concern  ? Not on file  ?Social History Narrative  ? Left-handed.  ? 1.5  cups caffeine per day.  ? Lives at home with his wife.  ? ?Social Determinants of Health  ? ?Financial Resource Strain: Not on file  ?Food Insecurity: Not on file  ?Transportation Needs: Not on file  ?Physical Activity: Not on file  ?Stress: Not on file  ?Social Connections: Not on file  ?  ? ?Family History: ?The patient's family history includes Heart attack in his mother; Other in his father and sister; Prostate cancer in his maternal grandfather. There is no history of Colon cancer, Esophageal cancer, Pancreatic cancer, Rectal cancer, or Stomach cancer. ? ?ROS:   ?Please see the history of present illness.    ? ?All other systems reviewed and are negative. ? ?EKGs/Labs/Other Studies Reviewed:   ? ?The following studies were reviewed today: ? ?CT Chest 05/24/2021: ?COMPARISON:  Chest radiograph performed earlier on the same date ?  ?FINDINGS: ?Cardiovascular: No significant vascular findings. Normal heart size. ?No pericardial effusion. ?  ?Mediastinum/Nodes: No enlarged mediastinal or axillary lymph nodes. ?Right thyroid lobe protrudes into the thoracic inlet. Left thyroid ?lobe not visualized, correlate with prior history. Trachea, and ?esophagus demonstrate no significant findings. ?  ?Lungs/Pleura: Lungs are clear. No pleural effusion or pneumothorax. ?  ?Upper Abdomen: No acute abnormality. ?   ?Musculoskeletal: No fracture is seen. ?  ?IMPRESSION: ?1.  No appreciable rib fracture. ?  ?2.  No CT evidence of acute intrathoracic abnormality. ? ?Echo 02/23/2021: ? 1. Left ventricular ejection fraction, by estimation, is

## 2021-08-24 ENCOUNTER — Telehealth: Payer: Self-pay

## 2021-08-24 NOTE — Telephone Encounter (Signed)
Patient is aware of time change for procedure. ?

## 2021-08-24 NOTE — Telephone Encounter (Signed)
Per Dr. Fuller Plan, move patient up to 8:30am for colon scheduled on 11/02/21. Left message for patient to call back. ?

## 2021-08-25 DIAGNOSIS — J3089 Other allergic rhinitis: Secondary | ICD-10-CM | POA: Diagnosis not present

## 2021-08-25 DIAGNOSIS — J301 Allergic rhinitis due to pollen: Secondary | ICD-10-CM | POA: Diagnosis not present

## 2021-08-25 DIAGNOSIS — J3081 Allergic rhinitis due to animal (cat) (dog) hair and dander: Secondary | ICD-10-CM | POA: Diagnosis not present

## 2021-09-05 DIAGNOSIS — I776 Arteritis, unspecified: Secondary | ICD-10-CM | POA: Diagnosis not present

## 2021-09-05 DIAGNOSIS — G939 Disorder of brain, unspecified: Secondary | ICD-10-CM | POA: Diagnosis not present

## 2021-09-07 DIAGNOSIS — J301 Allergic rhinitis due to pollen: Secondary | ICD-10-CM | POA: Diagnosis not present

## 2021-09-07 DIAGNOSIS — J3089 Other allergic rhinitis: Secondary | ICD-10-CM | POA: Diagnosis not present

## 2021-09-11 DIAGNOSIS — Z20822 Contact with and (suspected) exposure to covid-19: Secondary | ICD-10-CM | POA: Diagnosis not present

## 2021-09-15 DIAGNOSIS — Z20822 Contact with and (suspected) exposure to covid-19: Secondary | ICD-10-CM | POA: Diagnosis not present

## 2021-09-15 DIAGNOSIS — I776 Arteritis, unspecified: Secondary | ICD-10-CM | POA: Diagnosis not present

## 2021-09-15 DIAGNOSIS — Z79899 Other long term (current) drug therapy: Secondary | ICD-10-CM | POA: Diagnosis not present

## 2021-09-15 DIAGNOSIS — Z7952 Long term (current) use of systemic steroids: Secondary | ICD-10-CM | POA: Diagnosis not present

## 2021-09-19 DIAGNOSIS — J301 Allergic rhinitis due to pollen: Secondary | ICD-10-CM | POA: Diagnosis not present

## 2021-09-19 DIAGNOSIS — J3089 Other allergic rhinitis: Secondary | ICD-10-CM | POA: Diagnosis not present

## 2021-09-26 DIAGNOSIS — G4733 Obstructive sleep apnea (adult) (pediatric): Secondary | ICD-10-CM | POA: Diagnosis not present

## 2021-09-26 DIAGNOSIS — Z20822 Contact with and (suspected) exposure to covid-19: Secondary | ICD-10-CM | POA: Diagnosis not present

## 2021-09-29 DIAGNOSIS — I776 Arteritis, unspecified: Secondary | ICD-10-CM | POA: Diagnosis not present

## 2021-10-03 DIAGNOSIS — J301 Allergic rhinitis due to pollen: Secondary | ICD-10-CM | POA: Diagnosis not present

## 2021-10-03 DIAGNOSIS — J3089 Other allergic rhinitis: Secondary | ICD-10-CM | POA: Diagnosis not present

## 2021-10-06 DIAGNOSIS — Z20822 Contact with and (suspected) exposure to covid-19: Secondary | ICD-10-CM | POA: Diagnosis not present

## 2021-10-12 ENCOUNTER — Ambulatory Visit (AMBULATORY_SURGERY_CENTER): Payer: Medicare Other | Admitting: *Deleted

## 2021-10-12 VITALS — Ht 70.5 in | Wt 222.0 lb

## 2021-10-12 DIAGNOSIS — J3081 Allergic rhinitis due to animal (cat) (dog) hair and dander: Secondary | ICD-10-CM | POA: Diagnosis not present

## 2021-10-12 DIAGNOSIS — Z20822 Contact with and (suspected) exposure to covid-19: Secondary | ICD-10-CM | POA: Diagnosis not present

## 2021-10-12 DIAGNOSIS — Z86010 Personal history of colonic polyps: Secondary | ICD-10-CM

## 2021-10-12 DIAGNOSIS — J3089 Other allergic rhinitis: Secondary | ICD-10-CM | POA: Diagnosis not present

## 2021-10-12 MED ORDER — NA SULFATE-K SULFATE-MG SULF 17.5-3.13-1.6 GM/177ML PO SOLN: 1.0000 | Freq: Once | ORAL | 0 refills | Status: AC

## 2021-10-12 NOTE — Progress Notes (Signed)
No egg or soy allergy known to patient  ?No issues known to pt with past sedation with any surgeries or procedures ?Patient denies ever being told they had issues or difficulty with intubation  ?No FH of Malignant Hyperthermia ?Pt is not on diet pills ?Pt is not on  home 02  ?Pt is not on blood thinners  ?Pt denies issues with constipation  ?No A fib or A flutter ? ?suprep Coupon to pt in PV today , Code to Pharmacy and  NO PA's for preps discussed with pt In PV today  ?Discussed with pt there will be an out-of-pocket cost for prep and that varies from $0 to 70 +  dollars - pt verbalized understanding  ?Pt instructed to use Singlecare.com or GoodRx for a price reduction on prep  ? ?PV completed over the phone. Pt verified name, DOB, address and insurance during PV today.  ?Pt mailed instruction packet with copy of consent form to read and not return, and instructions.  ?Pt encouraged to call with questions or issues.  ?If pt has My chart, procedure instructions sent via My Chart  ?Insurance confirmed with pt at [facility] today   ? ?Pt.has h/o a fib ?

## 2021-10-13 DIAGNOSIS — J22 Unspecified acute lower respiratory infection: Secondary | ICD-10-CM | POA: Diagnosis not present

## 2021-10-13 DIAGNOSIS — R051 Acute cough: Secondary | ICD-10-CM | POA: Diagnosis not present

## 2021-10-17 DIAGNOSIS — J40 Bronchitis, not specified as acute or chronic: Secondary | ICD-10-CM | POA: Diagnosis not present

## 2021-10-17 DIAGNOSIS — J45990 Exercise induced bronchospasm: Secondary | ICD-10-CM | POA: Diagnosis not present

## 2021-10-17 DIAGNOSIS — Z20822 Contact with and (suspected) exposure to covid-19: Secondary | ICD-10-CM | POA: Diagnosis not present

## 2021-10-17 DIAGNOSIS — J069 Acute upper respiratory infection, unspecified: Secondary | ICD-10-CM | POA: Diagnosis not present

## 2021-10-20 ENCOUNTER — Telehealth: Payer: Self-pay | Admitting: *Deleted

## 2021-10-20 NOTE — Telephone Encounter (Signed)
Pt walked into office stating he was in AFib and would like to be seen. ?Informed that we do not take walk ins. ?Discussed concern more..... ?States he has been out of rhythm since this morning, no symptoms. ?Wife reports recent infection and pt is taking Prednisone. ?Made aware this is most likely the culprit to the cause of his AFib (pt has not had any in about 2 years). ?Pt scheduled to see AFib clinic next week, per their request to be seen. ?Advised that he will most likely go back into NSR and to cancel that appt if so. ?Aware to keep appt and/or call office if he remains in AFib. ?Educated that we typically tell pts to call us once they have been out of rythym for longer than 24-48 hours and/or symptomatic. ?Patient & wife verbalized understanding and agreeable to plan.  ? ?

## 2021-10-24 ENCOUNTER — Encounter (HOSPITAL_COMMUNITY): Payer: Self-pay | Admitting: Physician Assistant

## 2021-10-24 ENCOUNTER — Ambulatory Visit (HOSPITAL_COMMUNITY)
Admission: RE | Admit: 2021-10-24 | Discharge: 2021-10-24 | Disposition: A | Discharge status: Home / Self Care | Payer: Medicare Other | Source: Ambulatory Visit | Attending: Nurse Practitioner | Admitting: Nurse Practitioner

## 2021-10-24 VITALS — BP 124/72 | HR 131 | Ht 70.5 in | Wt 224.0 lb

## 2021-10-24 DIAGNOSIS — Z9989 Dependence on other enabling machines and devices: Secondary | ICD-10-CM | POA: Insufficient documentation

## 2021-10-24 DIAGNOSIS — I4819 Other persistent atrial fibrillation: Secondary | ICD-10-CM | POA: Diagnosis not present

## 2021-10-24 DIAGNOSIS — G4733 Obstructive sleep apnea (adult) (pediatric): Secondary | ICD-10-CM | POA: Diagnosis not present

## 2021-10-24 DIAGNOSIS — I10 Essential (primary) hypertension: Secondary | ICD-10-CM | POA: Insufficient documentation

## 2021-10-24 DIAGNOSIS — Z79899 Other long term (current) drug therapy: Secondary | ICD-10-CM | POA: Insufficient documentation

## 2021-10-24 DIAGNOSIS — D6869 Other thrombophilia: Secondary | ICD-10-CM

## 2021-10-24 LAB — CBC
HCT: 45.1 % (ref 39.0–52.0)
Hemoglobin: 15.5 g/dL (ref 13.0–17.0)
MCH: 32.7 pg (ref 26.0–34.0)
MCHC: 34.4 g/dL (ref 30.0–36.0)
MCV: 95.1 fL (ref 80.0–100.0)
Platelets: 365 10*3/uL (ref 150–400)
RBC: 4.74 MIL/uL (ref 4.22–5.81)
RDW: 12.1 % (ref 11.5–15.5)
WBC: 11.1 10*3/uL — ABNORMAL HIGH (ref 4.0–10.5)
nRBC: 0 % (ref 0.0–0.2)

## 2021-10-24 LAB — BASIC METABOLIC PANEL
Anion gap: 8 (ref 5–15)
BUN: 12 mg/dL (ref 8–23)
CO2: 22 mmol/L (ref 22–32)
Calcium: 9.1 mg/dL (ref 8.9–10.3)
Chloride: 107 mmol/L (ref 98–111)
Creatinine, Ser: 0.88 mg/dL (ref 0.61–1.24)
GFR, Estimated: >60 mL/min (ref >60)
Glucose, Bld: 122 mg/dL — ABNORMAL HIGH (ref 70–99)
Potassium: 4.3 mmol/L (ref 3.5–5.1)
Sodium: 137 mmol/L (ref 135–145)

## 2021-10-24 NOTE — Progress Notes (Signed)
? ?Primary Care Physician: Vernie Shanks, MD ?Referring Physician: Oda Kilts, PA ?Cardiologist: Dr. Harrington Challenger  ?Primary EP: Dr Quentin Ore ? ? ?Cory Mathis is a 71 y.o. male with a h/o persistent afib, bradycardia, HTN, OSA, treated with cpap, that is in the afib clinic s/p successful  cardioversion 02/08/21, scheduled by Oda Kilts.  ? ?EKG shows sinus brady at 54 bpm, not symptomatic with this.  Per [name]'s note, he preferred not to be on AAD's and would prefer ablation in the future if needed. He was not aware of the afib, just noted higher HR's on his apple watch and requested appointment with  Dr. Harrington Challenger. He is already scheduled with Dr. Quentin Ore 10/27. He had a basically normal echo in 2020 and is scheduled for a updated echo. He denies alcohol use, no tobacco, some caffeine, not excessive.   ? ?Follow up in the AF clinic 10/24/21. Patient reports that about 5 days ago he noted elevated heart rates on his Apple Watch and symptoms of "sluggishness". He is in afib today with elevated heart rates. He has recently been treated for a respiratory infection with abx and increased prednisone.  ? ?Today, he denies symptoms of chest pain, shortness of breath, orthopnea, PND, lower extremity edema, dizziness, presyncope, syncope, or neurologic sequela. The patient is tolerating medications without difficulties and is otherwise without complaint today.  ? ?Past Medical History:  ?Diagnosis Date  ? Allergic rhinitis   ? Allergy   ? Atrial fibrillation with rapid ventricular response ([place])   ? Benign neoplasm of right choroid   ? BPH (benign prostatic hyperplasia)   ? Brain tumor (benign) ([place])   ? Cataract   ? bilateral,removed  ? Chest pain   ? Chronic headaches   ? Constipation   ? Diverticulosis of colon   ? Dysplastic nevus   ? Dysrhythmia 2019  ? Afib  ? Fatigue   ? Gastroesophageal reflux disease without esophagitis   ? GERD (gastroesophageal reflux disease)   ? prn med. control  ? H/O thyroidectomy   ? Headache   ?  History of adenomatous polyp of colon   ? 03-29-2007  tubular adenoma/  08-07-2016  hyperplastic and tubular adenoma's  ? History of alcoholic gastritis   ? 10-30-2008  gastritis, esophagitis, peptic duodenitis  ? History of colonic diverticulitis   ? 01-31-2016  resolved w/ medications  ? History of Helicobacter pylori infection   ? 10-30-2008  ? History of thyroid nodule   ? 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 Hypertension   ? Hypothyroidism   ? Insomnia   ? Internal hemorrhoids   ? Left leg weakness   ? Memory loss   ? Nocturia   ? OSA on CPAP   ? CPAP nightly  ? Paresthesia of both feet   ? Retinal pigmentation   ? 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 Right thyroid nodule   ? Sinus bradycardia   ? Sleep apnea   ? c pap  ? Urinary retention 12/13/2016  ? ?Past Surgical History:  ?Procedure Laterality Date  ? APPLICATION OF CRANIAL NAVIGATION N/A 04/06/2020  ? Procedure: APPLICATION OF CRANIAL NAVIGATION;  Surgeon: Judith Part, MD;  Location: [place];  Service: Neurosurgery;  Laterality: N/A;  ? BRAIN SURGERY    ? CARDIOVERSION N/A 12/23/2018  ? Procedure: CARDIOVERSION;  Surgeon: Buford Dresser, MD;  Location: [place];  Service: Cardiovascular;  Laterality: N/A;  ? CARDIOVERSION N/A 02/08/2021  ? Procedure: CARDIOVERSION;  Surgeon: Fay Records, MD;  Location[facility] ENDOSCOPY;  Service: Cardiovascular;  Laterality: N/A;  ? COLONOSCOPY  last one 08-07-2016  ? EYE SURGERY    ? FRAMELESS  BIOPSY WITH BRAINLAB Left 04/06/2020  ? Procedure: Left Stereotactic brain biopsy with brainlab;  Surgeon: Judith Part, MD;  Location: [place];  Service: Neurosurgery;  Laterality: Left;  ? HEMORRHOID BANDING  10-20-2016;  09-11-2016  ? HERNIA REPAIR    ? KNEE ARTHROSCOPY Bilateral   ? 10 + yrs ago  ? LASIK Bilateral   ? PILONIDAL CYST EXCISION    ? POLYPECTOMY    ? THYROIDECTOMY Left 2013  ? TRANSURETHRAL RESECTION OF PROSTATE N/A 01/04/2017  ? Procedure: TRANSURETHRAL RESECTION OF THE  PROSTATE (TURP);  Surgeon: Franchot Gallo, MD;  Location: [facility];  Service: Urology;  Laterality: N/A;  ? UPPER GASTROINTESTINAL ENDOSCOPY  10/30/2008  ? WISDOM TOOTH EXTRACTION    ? ? ?Current Outpatient Medications  ?Medication Sig Dispense Refill  ? amLODipine (NORVASC) 5 MG tablet TAKE 1 TABLET(5 MG) BY MOUTH TWICE DAILY 180 tablet 3  ? CYCLOPHOSPHAMIDE IV Inject into the vein. Inject once monthly    ? diltiazem (CARDIZEM CD) 120 MG 24 hr capsule Take 1 capsule (120 mg total) by mouth daily. 90 capsule 3  ? dilTIAZem HCl (CARDIZEM PO) Take 120 mg by mouth daily.    ? EPINEPHrine 0.3 mg/0.3 mL IJ SOAJ injection Inject 0.3 mg into the muscle as needed for anaphylaxis.    ? eszopiclone (LUNESTA) 2 MG TABS tablet Take 2 mg by mouth at bedtime.    ? famotidine (PEPCID) 20 MG tablet Take 1 tablet by mouth daily as needed for heartburn.    ? GEMTESA 75 MG TABS Take 1 tablet by mouth daily.    ? mycophenolate (CELLCEPT) 500 MG tablet Take 500 mg by mouth 3 (three) times daily.    ? predniSONE (DELTASONE) 10 MG tablet Take 20 mg by mouth daily.    ? rosuvastatin (CRESTOR) 5 MG tablet TAKE 1/2 TABLET(2.5 MG) BY MOUTH DAILY 45 tablet 3  ? tetrahydrozoline 0.05 % ophthalmic solution Apply 1 drop to eye daily as needed (dry eyes).    ? VITAMIN D PO     ? XARELTO 20 MG TABS tablet TAKE 1 TABLET(20 MG) BY MOUTH DAILY WITH SUPPER 90 tablet 1  ? albuterol (VENTOLIN HFA) 108 (90 Base) MCG/ACT inhaler SMARTSIG:2 Puff(s) By Mouth Every 6 Hours PRN    ? ?No current facility-administered medications for this encounter.  ? ? ?Allergies  ?Allergen Reactions  ? Other   ?  Mints - makes him sneeze  ? Sulfa Antibiotics Hives and Rash  ? Sulfasalazine Hives and Other (See Comments)  ?  other  ? Ambrosia Artemisiifolia (Ragweed) Skin Test Other (See Comments)  ?  hayfever  ? Flagyl [Metronidazole] Other (See Comments)  ?  Pt reported he was light headed for 2 weeks. ? ?  ? ? ?Social History  ? ?Socioeconomic History   ? Marital status: Married  ?  Spouse name: Not on file  ? Number of children: 0  ? Years of education: 16+  ? Highest education level: Bachelor's degree (e.g., BA, AB, BS)  ?Occupational History  ? Occupation: Retired  ?Tobacco Use  ? Smoking status: Former  ? Smokeless tobacco: Never  ? Tobacco comments:  ?  Former smoker 10/24/21  ?Vaping Use  ? Vaping Use: Never used  ?Substance and Sexual Activity  ? Alcohol use:  Yes  ?  Comment: social   ? Drug use: No  ? Sexual activity: Not on file  ?Other Topics Concern  ? Not on file  ?Social History Narrative  ? Left-handed.  ? 1.5 cups caffeine per day.  ? Lives at home with his wife.  ? ?Social Determinants of Health  ? ?Financial Resource Strain: Not on file  ?Food Insecurity: Not on file  ?Transportation Needs: Not on file  ?Physical Activity: Not on file  ?Stress: Not on file  ?Social Connections: Not on file  ?Intimate Partner Violence: Not on file  ? ? ?Family History  ?Problem Relation Age of Onset  ? Heart attack Mother   ?     8's  ? Other Father   ?     atherosclerosis - 40's  ? Other Sister   ?     brain tumor - unsure if it was cancer - 15's  ? Prostate cancer Maternal Grandfather   ? Colon cancer Neg Hx   ? Esophageal cancer Neg Hx   ? Pancreatic cancer Neg Hx   ? Rectal cancer Neg Hx   ? Stomach cancer Neg Hx   ? Colon polyps Neg Hx   ? Crohn's disease Neg Hx   ? ? ?ROS- All systems are reviewed and negative except as per the HPI above ? ?Physical Exam: ?Vitals:  ? 10/24/21 1335  ?BP: 124/72  ?Pulse: (!) 131  ?Weight: 101.6 kg  ?Height: 5' 10.5" (1.791 m)  ? ? ?Wt Readings from Last 3 Encounters:  ?10/24/21 101.6 kg  ?10/12/21 100.7 kg  ?08/23/21 102.5 kg  ? ? ?Labs: ?Lab Results  ?Component Value Date  ? NA 135 05/24/2021  ? K 3.7 05/24/2021  ? CL 104 05/24/2021  ? CO2 23 05/24/2021  ? GLUCOSE 103 (H) 05/24/2021  ? BUN 16 05/24/2021  ? CREATININE 1.03 05/24/2021  ? CALCIUM 8.7 (L) 05/24/2021  ? ?Lab Results  ?Component Value Date  ? INR 1.8 (H) 05/24/2021   ? ?Lab Results  ?Component Value Date  ? CHOL 106 08/22/2019  ? HDL 38 (L) 08/22/2019  ? [place] 47 08/22/2019  ? TRIG 118 08/22/2019  ? ? ? ?GEN- The patient is a well appearing obese male, alert and orient

## 2021-10-24 NOTE — Patient Instructions (Signed)
Until 5/14 - hold amlodipine and increase cardizem (Diltiazem) to '120mg'$  twice a day  -- Return to normal dosing day of cardioversion ?  ?Cardioversion scheduled for Wednesday, May 24th ? - Arrive at the [facility] and go to admitting at 1130am ? - Do not eat or drink anything after midnight the night prior to your procedure. ? - Take all your morning medication (except diabetic medications) with a sip of water prior to arrival. ? - You will not be able to drive home after your procedure. ? - Do NOT miss any doses of your blood thinner - if you should miss a dose please notify our office immediately. ? - If you feel as if you go back into normal rhythm prior to scheduled cardioversion, please notify our office immediately. If your procedure is canceled in the cardioversion suite you will be charged a cancellation fee. ? ?

## 2021-10-25 ENCOUNTER — Ambulatory Visit (HOSPITAL_COMMUNITY): Payer: 59 | Admitting: Nurse Practitioner

## 2021-10-25 DIAGNOSIS — J3089 Other allergic rhinitis: Secondary | ICD-10-CM | POA: Diagnosis not present

## 2021-10-25 DIAGNOSIS — J301 Allergic rhinitis due to pollen: Secondary | ICD-10-CM | POA: Diagnosis not present

## 2021-10-26 ENCOUNTER — Encounter (HOSPITAL_COMMUNITY): Payer: Self-pay | Admitting: Cardiology

## 2021-10-30 ENCOUNTER — Telehealth: Payer: Self-pay | Admitting: Home Health

## 2021-10-30 NOTE — Telephone Encounter (Signed)
Patient called after-hours line, reporting that he is not sure if he took his Xarelto today and is scheduled for cardioversion on 11/02/2021.  Informed patient that if missing his Xarelto today, cardioversion on 11/02/2021 likely will be canceled.  He wishes to know how much risk is he taking if he took his Xarelto twice today.  Unable to quantify risk of bleeding if taking Xarelto twice, explained this to the patient.  Patient does not wish cardioversion to be canceled decided to take Xarelto this morning, informed patient that he should monitor himself for bleeding closely and avoid contact sports/falls.  Patient understands above conversation and is agreeable with plan.

## 2021-10-31 NOTE — Telephone Encounter (Signed)
Confirmed no missed doses of xarelto.

## 2021-11-01 ENCOUNTER — Encounter (HOSPITAL_COMMUNITY): Payer: Self-pay

## 2021-11-01 ENCOUNTER — Encounter (HOSPITAL_COMMUNITY): Payer: 59 | Admitting: Physician Assistant

## 2021-11-01 ENCOUNTER — Telehealth (HOSPITAL_COMMUNITY): Payer: Self-pay | Admitting: *Deleted

## 2021-11-01 MED ORDER — DILTIAZEM HCL ER COATED BEADS 120 MG PO CP24: 120.0000 mg | ORAL_CAPSULE | Freq: Two times a day (BID) | ORAL | 1 refills | Status: DC

## 2021-11-01 NOTE — Telephone Encounter (Signed)
Patient converted to NSR confirmed by apple watch tracing on 5/23. Heart rate 60.  Per Adline Peals PA will continue cardizem '120mg'$  BID and stop amlodipine. Follow up with Dr. Harrington Challenger as scheduled and afib clinic in 6 months. Pt in agreement and cardioversion canceled.

## 2021-11-02 ENCOUNTER — Ambulatory Visit (HOSPITAL_COMMUNITY): Admission: RE | Admit: 2021-11-02 | Payer: Medicare Other | Source: Home / Self Care | Admitting: Cardiology

## 2021-11-02 ENCOUNTER — Encounter: Payer: 59 | Admitting: Gastroenterology

## 2021-11-02 SURGERY — CARDIOVERSION
Anesthesia: General

## 2021-11-03 DIAGNOSIS — Z23 Encounter for immunization: Secondary | ICD-10-CM | POA: Diagnosis not present

## 2021-11-08 DIAGNOSIS — J3089 Other allergic rhinitis: Secondary | ICD-10-CM | POA: Diagnosis not present

## 2021-11-08 DIAGNOSIS — J3081 Allergic rhinitis due to animal (cat) (dog) hair and dander: Secondary | ICD-10-CM | POA: Diagnosis not present

## 2021-11-08 DIAGNOSIS — J301 Allergic rhinitis due to pollen: Secondary | ICD-10-CM | POA: Diagnosis not present

## 2021-11-11 ENCOUNTER — Ambulatory Visit (HOSPITAL_COMMUNITY): Payer: 59 | Admitting: Physician Assistant

## 2021-11-17 DIAGNOSIS — J3081 Allergic rhinitis due to animal (cat) (dog) hair and dander: Secondary | ICD-10-CM | POA: Diagnosis not present

## 2021-11-17 DIAGNOSIS — J301 Allergic rhinitis due to pollen: Secondary | ICD-10-CM | POA: Diagnosis not present

## 2021-11-17 DIAGNOSIS — J3089 Other allergic rhinitis: Secondary | ICD-10-CM | POA: Diagnosis not present

## 2021-11-24 DIAGNOSIS — J301 Allergic rhinitis due to pollen: Secondary | ICD-10-CM | POA: Diagnosis not present

## 2021-11-24 DIAGNOSIS — J3089 Other allergic rhinitis: Secondary | ICD-10-CM | POA: Diagnosis not present

## 2021-11-24 DIAGNOSIS — J3081 Allergic rhinitis due to animal (cat) (dog) hair and dander: Secondary | ICD-10-CM | POA: Diagnosis not present

## 2021-11-28 DIAGNOSIS — I675 Moyamoya disease: Secondary | ICD-10-CM | POA: Diagnosis not present

## 2021-11-28 DIAGNOSIS — I776 Arteritis, unspecified: Secondary | ICD-10-CM | POA: Diagnosis not present

## 2021-11-28 NOTE — Progress Notes (Unsigned)
Cardiology Office Note   Date:  12/08/2021   ID:  Cory Mathis, DOB 11/12/1950, MRN [id number]  PCP:  Vernie Shanks, MD  Cardiologist:   Dorris Carnes, MD    Pt presents for f/u of CAD and PAF   History of Present Illness: Costantino Mathis is a 71 y.o. male with a history ofHTN, , PAF, HL, multinodular goiter (s/p L thyroidectomy), neurovasculitis, seizuers and CAD by CT (Ca score of 186)   In  June 2020 he was placed on cardiazem and Xarelto   Underwent cardioversion  He was switched to metoprolol from cardiazem He developed bradycardia on cardiazem and was switched back to metoprolol. The pt is followed at [facility] for neuro.  Currently on immune modulating agaents     The pt was seen in afib clinic by A Tillery in Aug 2022 after afib noted on smart phone   Underwnt cardioversion in Aug 2022.   He was seen by C lambert in March 2023   Afib burden low   He was seen again in afib clinic in May   Back in afib with RVR   Pt felt sluggish.     Underwent cardioversion on 11/02/21.    Patients says one day he would like to come off of some of meds, incluiding blood thinner if possibe  Since seen he notes no recurrent episodes of afib.  Breathing is OK    Denies CP  Does feel sluggish      Had CT of head at [facility]  on 11/28/21    Current Meds  Medication Sig   albuterol (VENTOLIN HFA) 108 (90 Base) MCG/ACT inhaler Inhale 2 puffs into the lungs every 6 (six) hours as needed for wheezing or shortness of breath.   Carboxymethylcellul-Glycerin (LUBRICATING EYE DROPS OP) Place 1 drop into both eyes daily as needed (dry eyes).   diltiazem (CARDIZEM CD) 120 MG 24 hr capsule Take 1 capsule (120 mg total) by mouth 2 (two) times daily.   EPINEPHrine 0.3 mg/0.3 mL IJ SOAJ injection Inject 0.3 mg into the muscle as needed for anaphylaxis.   eszopiclone (LUNESTA) 2 MG TABS tablet Take 2 mg by mouth at bedtime.   famotidine-calcium carbonate-magnesium hydroxide (PEPCID COMPLETE) 10-800-165 MG chewable tablet  Chew 1 tablet by mouth at bedtime as needed (acid reflux).   fluticasone (FLONASE) 50 MCG/ACT nasal spray Place 1 spray into both nostrils daily as needed for allergies or rhinitis.   GEMTESA 75 MG TABS Take 75 mg by mouth daily.   mycophenolate (CELLCEPT) 500 MG tablet Take 1,500 mg by mouth 2 (two) times daily.   predniSONE (DELTASONE) 5 MG tablet Take 15 mg by mouth daily.   rivaroxaban (XARELTO) 20 MG TABS tablet Take 20 mg by mouth daily with breakfast.   rosuvastatin (CRESTOR) 5 MG tablet TAKE 1/2 TABLET(2.5 MG) BY MOUTH DAILY   [DISCONTINUED] XARELTO 20 MG TABS tablet TAKE 1 TABLET(20 MG) BY MOUTH DAILY WITH SUPPER (Patient taking differently: daily after breakfast.)     Allergies:   Other, Sulfa antibiotics, Sulfasalazine, Ambrosia artemisiifolia (ragweed) skin test, and Flagyl [metronidazole]   Past Medical History:  Diagnosis Date   Allergic rhinitis    Allergy    Atrial fibrillation with rapid ventricular response (HCC)    Benign neoplasm of right choroid    BPH (benign prostatic hyperplasia)    Brain tumor (benign) (HCC)    Cataract    bilateral,removed   Chest pain    Chronic headaches  Constipation    Diverticulosis of colon    Dysplastic nevus    Dysrhythmia 2019   Afib   Fatigue    Gastroesophageal reflux disease without esophagitis    GERD (gastroesophageal reflux disease)    prn med. control   H/O thyroidectomy    Headache    History of adenomatous polyp of colon    03-29-2007  tubular adenoma/  08-07-2016  hyperplastic and tubular adenoma's   History of alcoholic gastritis    [id number]  gastritis, esophagitis, peptic duodenitis   History of colonic diverticulitis    01-31-2016  resolved w/ medications   History of Helicobacter pylori infection    10-30-2008   History of thyroid nodule    thyroid multinodule goiter left side s/p  left thyroidectomy   Hypertension    Hypothyroidism    Insomnia    Internal hemorrhoids    Left leg weakness     Memory loss    Nocturia    OSA on CPAP    CPAP nightly   Paresthesia of both feet    Retinal pigmentation    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[place])    Right thyroid nodule    Sinus bradycardia    Sleep apnea    c pap   Urinary retention 12/13/2016    Past Surgical History:  Procedure Laterality Date   APPLICATION OF CRANIAL NAVIGATION N/A 04/06/2020   Procedure: APPLICATION OF CRANIAL NAVIGATION;  Surgeon: Judith Part, MD;  Location: [place];  Service: Neurosurgery;  Laterality: N/A;   BRAIN SURGERY     CARDIOVERSION N/A 12/23/2018   Procedure: CARDIOVERSION;  Surgeon: Buford Dresser, MD;  Location: [facility] ENDOSCOPY;  Service: Cardiovascular;  Laterality: N/A;   CARDIOVERSION N/A 02/08/2021   Procedure: CARDIOVERSION;  Surgeon: Fay Records, MD;  Location: [place];  Service: Cardiovascular;  Laterality: N/A;   COLONOSCOPY  last one 08-07-2016   EYE SURGERY     FRAMELESS  BIOPSY WITH BRAINLAB Left 04/06/2020   Procedure: Left Stereotactic brain biopsy with brainlab;  Surgeon: Judith Part, MD;  Location: [place];  Service: Neurosurgery;  Laterality: Left;   HEMORRHOID BANDING  10-20-2016;  09-11-2016   HERNIA REPAIR     KNEE ARTHROSCOPY Bilateral    10 + yrs ago   LASIK Bilateral    PILONIDAL CYST EXCISION     POLYPECTOMY     THYROIDECTOMY Left 2013   TRANSURETHRAL RESECTION OF PROSTATE N/A 01/04/2017   Procedure: TRANSURETHRAL RESECTION OF THE PROSTATE (TURP);  Surgeon: Franchot Gallo, MD;  Location: [facility];  Service: Urology;  Laterality: N/A;   UPPER GASTROINTESTINAL ENDOSCOPY  10/30/2008   WISDOM TOOTH EXTRACTION       Social History:  The patient  reports that he has quit smoking. He has never used smokeless tobacco. He reports current alcohol use. He reports that he does not use drugs.   Family History:  The patient's family history includes Heart attack in his mother; Other in his father and sister; Prostate cancer in his maternal  grandfather.    ROS:  Please see the history of present illness. All other systems are reviewed and  Negative to the above problem except as noted.    PHYSICAL EXAM: VS:  BP 130/60   Pulse (!) 57   Ht 5' 10.5" (1.791 m)   Wt 226 lb (102.5 kg)   SpO2 97%   BMI 31.97 kg/m     GEN:   Obese 71 yo in no acute distress  HEENT: normal  Neck: no JVD, no carotid bruits Cardiac: RRR; no murmurs.  NO LE edema  Respiratory:  clear to auscultation bilaterally GI: soft, nontender, nondistended, + BS  No hepatomegaly  MS: no deformity Moving all extremities   Skin: warm and dry, no rash Neuro:  Deferred   Psych: euthymic mood, full affect   EKG:  EKG is not ordered today     Lipid Panel    Component Value Date/Time   CHOL 106 08/22/2019 1432   TRIG 118 08/22/2019 1432   HDL 38 (L) 08/22/2019 1432   CHOLHDL 2.8 08/22/2019 1432   LDLCALC 47 08/22/2019 1432      Wt Readings from Last 3 Encounters:  12/01/21 226 lb (102.5 kg)  10/24/21 224 lb (101.6 kg)  10/12/21 222 lb (100.7 kg)      ASSESSMENT AND PLAN:  1  PAF   Pt has had a couple cardioversions in the past 12 months  Last in May 2024    Feels poorly in afib   WIl review with C Quentin Ore other options, espeially if has another recurrence.     Again, all will need to reviewed, with consideration of neuro issues     2  Hx of CAD on CT    Pt denies angina     3  HL Lipid panel in Jan 2023   Excellent    LDL 52  HDL 36    4  HTN   BP controlled    5  Neuro   Continues to follow at [facility] for neurovasculitis    6  CV dz  Atherosclerosis on head CT    7  Weight   Frustrating for patient    Probably due to prednisone use    WIll check Hgb A1C   Current medicines are reviewed at length with the patient today.  The patient does not have concerns regarding medicines.  Signed, Dorris Carnes, MD  12/08/2021 10:18 PM    [address], [place], [place]  [address] Phone: [phone]; Fax:  [phone]

## 2021-11-29 DIAGNOSIS — J3081 Allergic rhinitis due to animal (cat) (dog) hair and dander: Secondary | ICD-10-CM | POA: Diagnosis not present

## 2021-11-29 DIAGNOSIS — J3089 Other allergic rhinitis: Secondary | ICD-10-CM | POA: Diagnosis not present

## 2021-11-29 DIAGNOSIS — J301 Allergic rhinitis due to pollen: Secondary | ICD-10-CM | POA: Diagnosis not present

## 2021-12-01 ENCOUNTER — Ambulatory Visit (INDEPENDENT_AMBULATORY_CARE_PROVIDER_SITE_OTHER): Payer: Medicare Other | Admitting: Internal Medicine

## 2021-12-01 ENCOUNTER — Encounter: Payer: Self-pay | Admitting: Internal Medicine

## 2021-12-01 VITALS — BP 130/60 | HR 57 | Ht 70.5 in | Wt 226.0 lb

## 2021-12-01 DIAGNOSIS — Z79899 Other long term (current) drug therapy: Secondary | ICD-10-CM | POA: Diagnosis not present

## 2021-12-01 LAB — HEMOGLOBIN A1C
Est. average glucose Bld gHb Est-mCnc: 105 mg/dL
Hgb A1c MFr Bld: 5.3 % (ref 4.8–5.6)

## 2021-12-01 NOTE — Patient Instructions (Signed)
Medication Instructions:  *If you need a refill on your cardiac medications before your next appointment, please call your pharmacy*   Lab Work: HGBA1C If you have labs (blood work) drawn today and your tests are completely normal, you will receive your results only by: [place] (if you have MyChart) OR A paper copy in the mail If you have any lab test that is abnormal or we need to change your treatment, we will call you to review the results.   Testing/Procedures:    Follow-Up: At [facility], you and your health needs are our priority.  As part of our continuing mission to provide you with exceptional heart care, we have created designated Provider Care Teams.  These Care Teams include your primary Cardiologist (physician) and Advanced Practice Providers (APPs -  Physician Assistants and Nurse Practitioners) who all work together to provide you with the care you need, when you need it.  We recommend signing up for the patient portal called "MyChart".  Sign up information is provided on this After Visit Summary.  MyChart is used to connect with patients for Virtual Visits (Telemedicine).  Patients are able to view lab/test results, encounter notes, upcoming appointments, etc.  Non-urgent messages can be sent to your provider as well.   To learn more about what you can do with MyChart, go to [email or web address].     Important Information About Sugar

## 2021-12-07 DIAGNOSIS — J3089 Other allergic rhinitis: Secondary | ICD-10-CM | POA: Diagnosis not present

## 2021-12-07 DIAGNOSIS — J301 Allergic rhinitis due to pollen: Secondary | ICD-10-CM | POA: Diagnosis not present

## 2021-12-09 DIAGNOSIS — J3089 Other allergic rhinitis: Secondary | ICD-10-CM | POA: Diagnosis not present

## 2021-12-09 DIAGNOSIS — J301 Allergic rhinitis due to pollen: Secondary | ICD-10-CM | POA: Diagnosis not present

## 2021-12-09 DIAGNOSIS — J3081 Allergic rhinitis due to animal (cat) (dog) hair and dander: Secondary | ICD-10-CM | POA: Diagnosis not present

## 2021-12-14 ENCOUNTER — Other Ambulatory Visit: Payer: Self-pay | Admitting: Internal Medicine

## 2021-12-14 ENCOUNTER — Other Ambulatory Visit: Payer: Self-pay

## 2021-12-14 MED ORDER — DILTIAZEM HCL ER COATED BEADS 120 MG PO CP24: 120.0000 mg | ORAL_CAPSULE | Freq: Two times a day (BID) | ORAL | 1 refills | Status: DC

## 2021-12-14 MED ORDER — ROSUVASTATIN CALCIUM 5 MG PO TABS: ORAL_TABLET | ORAL | 1 refills | Status: DC

## 2021-12-14 NOTE — Telephone Encounter (Signed)
Prescription refill request for Xarelto received.  Indication: PAF Last office visit: 12/01/21  Lizbeth Bark MD Weight: 102.5kg Age: 71 Scr: 0.88 on 10/24/21 CrCl: 110.01  Based on above findings Xarelto '20mg'$  daily is the appropriate dose.  Refill approved.

## 2021-12-15 DIAGNOSIS — H6120 Impacted cerumen, unspecified ear: Secondary | ICD-10-CM | POA: Diagnosis not present

## 2021-12-19 DIAGNOSIS — R3915 Urgency of urination: Secondary | ICD-10-CM | POA: Diagnosis not present

## 2021-12-19 DIAGNOSIS — N5201 Erectile dysfunction due to arterial insufficiency: Secondary | ICD-10-CM | POA: Diagnosis not present

## 2021-12-19 DIAGNOSIS — R350 Frequency of micturition: Secondary | ICD-10-CM | POA: Diagnosis not present

## 2021-12-19 DIAGNOSIS — N401 Enlarged prostate with lower urinary tract symptoms: Secondary | ICD-10-CM | POA: Diagnosis not present

## 2021-12-19 DIAGNOSIS — R351 Nocturia: Secondary | ICD-10-CM | POA: Diagnosis not present

## 2021-12-19 DIAGNOSIS — J3089 Other allergic rhinitis: Secondary | ICD-10-CM | POA: Diagnosis not present

## 2021-12-19 DIAGNOSIS — J301 Allergic rhinitis due to pollen: Secondary | ICD-10-CM | POA: Diagnosis not present

## 2021-12-19 DIAGNOSIS — J3081 Allergic rhinitis due to animal (cat) (dog) hair and dander: Secondary | ICD-10-CM | POA: Diagnosis not present

## 2021-12-20 DIAGNOSIS — H6121 Impacted cerumen, right ear: Secondary | ICD-10-CM | POA: Diagnosis not present

## 2021-12-21 ENCOUNTER — Encounter: Payer: 59 | Admitting: Gastroenterology

## 2021-12-21 DIAGNOSIS — Z13820 Encounter for screening for osteoporosis: Secondary | ICD-10-CM | POA: Diagnosis not present

## 2021-12-21 DIAGNOSIS — Z7952 Long term (current) use of systemic steroids: Secondary | ICD-10-CM | POA: Diagnosis not present

## 2021-12-21 DIAGNOSIS — M810 Age-related osteoporosis without current pathological fracture: Secondary | ICD-10-CM | POA: Diagnosis not present

## 2021-12-29 DIAGNOSIS — J3081 Allergic rhinitis due to animal (cat) (dog) hair and dander: Secondary | ICD-10-CM | POA: Diagnosis not present

## 2021-12-29 DIAGNOSIS — J3089 Other allergic rhinitis: Secondary | ICD-10-CM | POA: Diagnosis not present

## 2021-12-29 DIAGNOSIS — J301 Allergic rhinitis due to pollen: Secondary | ICD-10-CM | POA: Diagnosis not present

## 2022-01-03 DIAGNOSIS — D3131 Benign neoplasm of right choroid: Secondary | ICD-10-CM | POA: Diagnosis not present

## 2022-01-03 DIAGNOSIS — H1789 Other corneal scars and opacities: Secondary | ICD-10-CM | POA: Diagnosis not present

## 2022-01-07 ENCOUNTER — Other Ambulatory Visit: Payer: Self-pay | Admitting: Neurology

## 2022-01-11 DIAGNOSIS — R350 Frequency of micturition: Secondary | ICD-10-CM | POA: Diagnosis not present

## 2022-01-11 DIAGNOSIS — R3129 Other microscopic hematuria: Secondary | ICD-10-CM | POA: Diagnosis not present

## 2022-01-11 DIAGNOSIS — I10 Essential (primary) hypertension: Secondary | ICD-10-CM | POA: Diagnosis not present

## 2022-01-11 DIAGNOSIS — R3589 Other polyuria: Secondary | ICD-10-CM | POA: Diagnosis not present

## 2022-01-12 DIAGNOSIS — J301 Allergic rhinitis due to pollen: Secondary | ICD-10-CM | POA: Diagnosis not present

## 2022-01-12 DIAGNOSIS — J3089 Other allergic rhinitis: Secondary | ICD-10-CM | POA: Diagnosis not present

## 2022-01-12 DIAGNOSIS — M816 Localized osteoporosis [Lequesne]: Secondary | ICD-10-CM | POA: Diagnosis not present

## 2022-01-12 DIAGNOSIS — J3081 Allergic rhinitis due to animal (cat) (dog) hair and dander: Secondary | ICD-10-CM | POA: Diagnosis not present

## 2022-01-12 DIAGNOSIS — Z79899 Other long term (current) drug therapy: Secondary | ICD-10-CM | POA: Diagnosis not present

## 2022-01-12 DIAGNOSIS — I776 Arteritis, unspecified: Secondary | ICD-10-CM | POA: Diagnosis not present

## 2022-01-17 DIAGNOSIS — I776 Arteritis, unspecified: Secondary | ICD-10-CM | POA: Diagnosis not present

## 2022-01-17 DIAGNOSIS — M25552 Pain in left hip: Secondary | ICD-10-CM | POA: Diagnosis not present

## 2022-01-23 DIAGNOSIS — J301 Allergic rhinitis due to pollen: Secondary | ICD-10-CM | POA: Diagnosis not present

## 2022-01-23 DIAGNOSIS — J3089 Other allergic rhinitis: Secondary | ICD-10-CM | POA: Diagnosis not present

## 2022-01-26 DIAGNOSIS — J301 Allergic rhinitis due to pollen: Secondary | ICD-10-CM | POA: Diagnosis not present

## 2022-01-26 DIAGNOSIS — I4891 Unspecified atrial fibrillation: Secondary | ICD-10-CM | POA: Diagnosis not present

## 2022-01-26 DIAGNOSIS — J3089 Other allergic rhinitis: Secondary | ICD-10-CM | POA: Diagnosis not present

## 2022-01-31 ENCOUNTER — Telehealth: Payer: Self-pay | Admitting: Internal Medicine

## 2022-01-31 NOTE — Telephone Encounter (Signed)
Appt made for tomorrow for assessment. Pt heart rate on call was 62.

## 2022-01-31 NOTE — Telephone Encounter (Signed)
Patient c/o Palpitations:  High priority if patient c/o lightheadedness, shortness of breath, or chest pain  How long have you had palpitations/irregular HR/ Afib? Are you having the symptoms now?  Afib, past 2 days Still currently in afib   Are you currently experiencing lightheadedness, SOB or CP?  No   Do you have a history of afib (atrial fibrillation) or irregular heart rhythm?  Yes   Have you checked your BP or HR? (document readings if available):  HR 79 Patient unable to take BP when in afib   Are you experiencing any other symptoms?  Headaches, dizziness

## 2022-02-01 ENCOUNTER — Ambulatory Visit (HOSPITAL_COMMUNITY)
Admission: RE | Admit: 2022-02-01 | Discharge: 2022-02-01 | Disposition: A | Discharge status: Home / Self Care | Payer: Medicare Other | Source: Ambulatory Visit | Attending: Nurse Practitioner | Admitting: Nurse Practitioner

## 2022-02-01 ENCOUNTER — Encounter (HOSPITAL_COMMUNITY): Payer: Self-pay | Admitting: Cardiology

## 2022-02-01 VITALS — BP 130/68 | HR 114 | Ht 70.5 in | Wt 224.6 lb

## 2022-02-01 DIAGNOSIS — I4891 Unspecified atrial fibrillation: Secondary | ICD-10-CM | POA: Diagnosis not present

## 2022-02-01 DIAGNOSIS — I4819 Other persistent atrial fibrillation: Secondary | ICD-10-CM | POA: Diagnosis not present

## 2022-02-01 DIAGNOSIS — I10 Essential (primary) hypertension: Secondary | ICD-10-CM | POA: Insufficient documentation

## 2022-02-01 DIAGNOSIS — D6869 Other thrombophilia: Secondary | ICD-10-CM | POA: Diagnosis not present

## 2022-02-01 DIAGNOSIS — G4733 Obstructive sleep apnea (adult) (pediatric): Secondary | ICD-10-CM | POA: Insufficient documentation

## 2022-02-01 LAB — CBC
HCT: 46.3 % (ref 39.0–52.0)
Hemoglobin: 15.6 g/dL (ref 13.0–17.0)
MCH: 32.2 pg (ref 26.0–34.0)
MCHC: 33.7 g/dL (ref 30.0–36.0)
MCV: 95.5 fL (ref 80.0–100.0)
Platelets: 311 10*3/uL (ref 150–400)
RBC: 4.85 MIL/uL (ref 4.22–5.81)
RDW: 12.6 % (ref 11.5–15.5)
WBC: 13.4 10*3/uL — ABNORMAL HIGH (ref 4.0–10.5)
nRBC: 0 % (ref 0.0–0.2)

## 2022-02-01 LAB — BASIC METABOLIC PANEL
Anion gap: 7 (ref 5–15)
BUN: 12 mg/dL (ref 8–23)
CO2: 24 mmol/L (ref 22–32)
Calcium: 9.4 mg/dL (ref 8.9–10.3)
Chloride: 109 mmol/L (ref 98–111)
Creatinine, Ser: 1.04 mg/dL (ref 0.61–1.24)
GFR, Estimated: >60 mL/min (ref >60)
Glucose, Bld: 102 mg/dL — ABNORMAL HIGH (ref 70–99)
Potassium: 4.4 mmol/L (ref 3.5–5.1)
Sodium: 140 mmol/L (ref 135–145)

## 2022-02-01 NOTE — Progress Notes (Signed)
Primary Care Physician: Vernie Shanks, MD (Inactive) Referring Physician: Oda Kilts, PA Cardiologist: Dr. Harrington Challenger  Primary EP: Dr Lorella Nimrod is a 71 y.o. male with a h/o persistent afib, bradycardia, HTN, OSA, treated with cpap, that is in the afib clinic s/p successful  cardioversion 02/08/21, scheduled by Oda Kilts.   EKG shows sinus brady at 54 bpm, not symptomatic with this.  Per [name]'s note, he preferred not to be on AAD's and would prefer ablation in the future if needed. He was not aware of the afib, just noted higher HR's on his apple watch and requested appointment with  Dr. Harrington Challenger. He is already scheduled with Dr. Quentin Ore 10/27. He had a basically normal echo in 2020 and is scheduled for a updated echo. He denies alcohol use, no tobacco, some caffeine, not excessive.    Follow up in the AF clinic 10/24/21. Patient reports that about 5 days ago he noted elevated heart rates on his Apple Watch and symptoms of "sluggishness". He is in afib today with elevated heart rates. He has recently been treated for a respiratory infection with abx and increased prednisone.   F/u in afib clinic, 02/01/22,  as pt has developed persistent afib for the last 2 days associated with fatigue and lightheadedness. He had been tapering off prednisone for his vasculitis for several weeks now, starting at 15 mg and reducing by 2.5 mg weekly. He is now down 7.5 mg daily. No known triggers for afib. He saw Dr. Quentin Ore in March and due to low afib burden, it was a wait and watch approach. He had afib in May, set up for cardioversion but spontaneously converted and now again in August. No missed xarelto.   Today, he denies symptoms of chest pain, shortness of breath, orthopnea, PND, lower extremity edema, dizziness, presyncope, syncope, or neurologic sequela. The patient is tolerating medications without difficulties and is otherwise without complaint today.   Past Medical History:  Diagnosis Date    Allergic rhinitis    Allergy    Atrial fibrillation with rapid ventricular response (HCC)    Benign neoplasm of right choroid    BPH (benign prostatic hyperplasia)    Brain tumor (benign) (HCC)    Cataract    bilateral,removed   Chest pain    Chronic headaches    Constipation    Diverticulosis of colon    Dysplastic nevus    Dysrhythmia 2019   Afib   Fatigue    Gastroesophageal reflux disease without esophagitis    GERD (gastroesophageal reflux disease)    prn med. control   H/O thyroidectomy    Headache    History of adenomatous polyp of colon    03-29-2007  tubular adenoma/  08-07-2016  hyperplastic and tubular adenoma's   History of alcoholic gastritis    [id number]  gastritis, esophagitis, peptic duodenitis   History of colonic diverticulitis    01-31-2016  resolved w/ medications   History of Helicobacter pylori infection    10-30-2008   History of thyroid nodule    thyroid multinodule goiter left side s/p  left thyroidectomy   Hypertension    Hypothyroidism    Insomnia    Internal hemorrhoids    Left leg weakness    Memory loss    Nocturia    OSA on CPAP    CPAP nightly   Paresthesia of both feet    Retinal pigmentation    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HCC)    Right thyroid nodule  Sinus bradycardia    Sleep apnea    c pap   Urinary retention 12/13/2016   Past Surgical History:  Procedure Laterality Date   APPLICATION OF CRANIAL NAVIGATION N/A 04/06/2020   Procedure: APPLICATION OF CRANIAL NAVIGATION;  Surgeon: Judith Part, MD;  Location: [place];  Service: Neurosurgery;  Laterality: N/A;   BRAIN SURGERY     CARDIOVERSION N/A 12/23/2018   Procedure: CARDIOVERSION;  Surgeon: Buford Dresser, MD;  Location: [facility] ENDOSCOPY;  Service: Cardiovascular;  Laterality: N/A;   CARDIOVERSION N/A 02/08/2021   Procedure: CARDIOVERSION;  Surgeon: Fay Records, MD;  Location: [place];  Service: Cardiovascular;  Laterality: N/A;   COLONOSCOPY  last one  08-07-2016   EYE SURGERY     FRAMELESS  BIOPSY WITH BRAINLAB Left 04/06/2020   Procedure: Left Stereotactic brain biopsy with brainlab;  Surgeon: Judith Part, MD;  Location: [place];  Service: Neurosurgery;  Laterality: Left;   HEMORRHOID BANDING  10-20-2016;  09-11-2016   HERNIA REPAIR     KNEE ARTHROSCOPY Bilateral    10 + yrs ago   LASIK Bilateral    PILONIDAL CYST EXCISION     POLYPECTOMY     THYROIDECTOMY Left 2013   TRANSURETHRAL RESECTION OF PROSTATE N/A 01/04/2017   Procedure: TRANSURETHRAL RESECTION OF THE PROSTATE (TURP);  Surgeon: Franchot Gallo, MD;  Location: [facility];  Service: Urology;  Laterality: N/A;   UPPER GASTROINTESTINAL ENDOSCOPY  10/30/2008   WISDOM TOOTH EXTRACTION      Current Outpatient Medications  Medication Sig Dispense Refill   albuterol (VENTOLIN HFA) 108 (90 Base) MCG/ACT inhaler Inhale 2 puffs into the lungs every 6 (six) hours as needed for wheezing or shortness of breath.     Carboxymethylcellul-Glycerin (LUBRICATING EYE DROPS OP) Place 1 drop into both eyes daily as needed (dry eyes).     diltiazem (CARDIZEM CD) 120 MG 24 hr capsule Take 1 capsule (120 mg total) by mouth 2 (two) times daily. 180 capsule 1   EPINEPHrine 0.3 mg/0.3 mL IJ SOAJ injection Inject 0.3 mg into the muscle as needed for anaphylaxis.     eszopiclone (LUNESTA) 2 MG TABS tablet Take 2 mg by mouth at bedtime.     famotidine-calcium carbonate-magnesium hydroxide (PEPCID COMPLETE) 10-800-165 MG chewable tablet Chew 1 tablet by mouth at bedtime as needed (acid reflux).     fluticasone (FLONASE) 50 MCG/ACT nasal spray Place 1 spray into both nostrils daily as needed for allergies or rhinitis.     GEMTESA 75 MG TABS Take 75 mg by mouth daily.     predniSONE (DELTASONE) 5 MG tablet Take 15 mg by mouth daily.     rivaroxaban (XARELTO) 20 MG TABS tablet TAKE 1 TABLET(20 MG) BY MOUTH DAILY WITH SUPPER 90 tablet 1   rosuvastatin (CRESTOR) 5 MG tablet TAKE 1/2  TABLET(2.5 MG) BY MOUTH DAILY 45 tablet 1   No current facility-administered medications for this encounter.    Allergies  Allergen Reactions   Other     Mints - makes him sneeze   Sulfa Antibiotics Hives and Rash   Sulfasalazine Hives and Other (See Comments)   Ambrosia Artemisiifolia (Ragweed) Skin Test Other (See Comments)    hayfever   Flagyl [Metronidazole] Other (See Comments)    Pt reported he was light headed for 2 weeks.      Social History   Socioeconomic History   Marital status: Married    Spouse name: Not on file   Number of children: 0  Years of education: 16+   Highest education level: Bachelor's degree (e.g., BA, AB, BS)  Occupational History   Occupation: Retired  Tobacco Use   Smoking status: Former   Smokeless tobacco: Never   Tobacco comments:    Former smoker 10/24/21  Vaping Use   Vaping Use: Never used  Substance and Sexual Activity   Alcohol use: Yes    Comment: social    Drug use: No   Sexual activity: Not on file  Other Topics Concern   Not on file  Social History Narrative   Left-handed.   1.5 cups caffeine per day.   Lives at home with his wife.   Social Determinants of Health   Financial Resource Strain: Not on file  Food Insecurity: Not on file  Transportation Needs: Not on file  Physical Activity: Not on file  Stress: Not on file  Social Connections: Not on file  Intimate Partner Violence: Not on file    Family History  Problem Relation Age of Onset   Heart attack Mother        62's   Other Father        atherosclerosis - 18's   Other Sister        brain tumor - unsure if it was cancer - 35's   Prostate cancer Maternal Grandfather    Colon cancer Neg Hx    Esophageal cancer Neg Hx    Pancreatic cancer Neg Hx    Rectal cancer Neg Hx    Stomach cancer Neg Hx    Colon polyps Neg Hx    Crohn's disease Neg Hx     ROS- All systems are reviewed and negative except as per the HPI above  Physical Exam: There were  no vitals filed for this visit.   Wt Readings from Last 3 Encounters:  12/01/21 102.5 kg  10/24/21 101.6 kg  10/12/21 100.7 kg    Labs: Lab Results  Component Value Date   NA 137 10/24/2021   K 4.3 10/24/2021   CL 107 10/24/2021   CO2 22 10/24/2021   GLUCOSE 122 (H) 10/24/2021   BUN 12 10/24/2021   CREATININE 0.88 10/24/2021   CALCIUM 9.1 10/24/2021   Lab Results  Component Value Date   INR 1.8 (H) 05/24/2021   Lab Results  Component Value Date   CHOL 106 08/22/2019   HDL 38 (L) 08/22/2019   LDLCALC 47 08/22/2019   TRIG 118 08/22/2019     GEN- The patient is a well appearing obese male, alert and oriented x 3 today.   HEENT-head normocephalic, atraumatic, sclera clear, conjunctiva pink, hearing intact, trachea midline. Lungs- Clear to ausculation bilaterally, normal work of breathing Heart- irregular rate and rhythm, no murmurs, rubs or gallops  GI- soft, NT, ND, + BS Extremities- no clubbing, cyanosis, or edema MS- no significant deformity or atrophy Skin- no rash or lesion Psych- euthymic mood, full affect Neuro- strength and sensation are intact   EKG-Vent. rate 114 BPM PR interval * ms QRS duration 124 ms QT/QTcB 364/501 ms P-R-T axes * 30 -12 Wide QRS rhythm with Premature supraventricular complexes (AFIB) with  Right bundle branch block Abnormal ECG When compared with ECG of 24-Oct-2021 13:39, PREVIOUS ECG IS PRESENT   Epic records reviewed   Assessment and Plan:  1. Persistent afib  Patient back in persistent afib.  We discussed rhythm control  vrs ablation options, he would prefer less meds, would prefer ablation  Will plan for DCCV. Check bmet/cbc today.  Continue  diltiazem to 120 mg BID for rate control. Continue Xarelto 20 mg daily Apple Watch for home monitoring.   2. CHA2DS2VASc  score of 2 Continue xarelto 20 mg daily, states  no known missed doses for the last 3 weeks   3. HTN Stable   Follow up in the AF clinic post DCCV.  I  will refer back to Dr. Quentin Ore to further discuss timing of ablation Pt states he may also be interested in an Watchman as he does not want to be on long term anticoagulation    Butch Penny C. Tadeusz Stahl, [facility] [address] [place], [place] [address] [phone]

## 2022-02-01 NOTE — Patient Instructions (Signed)
Cardioversion scheduled for Wednesday, August 30th  - Arrive at the [facility] and go to admitting at [facility] not eat or drink anything after midnight the night prior to your procedure.  - Take all your morning medication (except diabetic medications) with a sip of water prior to arrival.  - You will not be able to drive home after your procedure.  - Do NOT miss any doses of your blood thinner - if you should miss a dose please notify our office immediately.  - If you feel as if you go back into normal rhythm prior to scheduled cardioversion, please notify our office immediately. If your procedure is canceled in the cardioversion suite you will be charged a cancellation fee.

## 2022-02-01 NOTE — Addendum Note (Signed)
Encounter addended by: Juluis Mire, RN on: 02/01/2022 1:33 PM  Actions taken: Order list changed

## 2022-02-07 ENCOUNTER — Telehealth (HOSPITAL_COMMUNITY): Payer: Self-pay | Admitting: *Deleted

## 2022-02-07 DIAGNOSIS — J3081 Allergic rhinitis due to animal (cat) (dog) hair and dander: Secondary | ICD-10-CM | POA: Diagnosis not present

## 2022-02-07 DIAGNOSIS — J3089 Other allergic rhinitis: Secondary | ICD-10-CM | POA: Diagnosis not present

## 2022-02-07 DIAGNOSIS — J301 Allergic rhinitis due to pollen: Secondary | ICD-10-CM | POA: Diagnosis not present

## 2022-02-07 NOTE — Telephone Encounter (Signed)
Patient back in NSR confirmed by apple watch tracing HR 68. Cardioversion canceled. Appt pending with Dr. Quentin Ore to discuss ablation.

## 2022-02-09 ENCOUNTER — Ambulatory Visit (HOSPITAL_COMMUNITY): Admission: RE | Admit: 2022-02-09 | Payer: Medicare Other | Source: Ambulatory Visit | Admitting: Internal Medicine

## 2022-02-09 SURGERY — CARDIOVERSION
Anesthesia: General

## 2022-02-15 ENCOUNTER — Ambulatory Visit (HOSPITAL_COMMUNITY): Payer: 59 | Admitting: Nurse Practitioner

## 2022-02-17 DIAGNOSIS — I677 Cerebral arteritis, not elsewhere classified: Secondary | ICD-10-CM | POA: Diagnosis not present

## 2022-02-17 DIAGNOSIS — I776 Arteritis, unspecified: Secondary | ICD-10-CM | POA: Diagnosis not present

## 2022-02-23 DIAGNOSIS — I771 Stricture of artery: Secondary | ICD-10-CM | POA: Diagnosis not present

## 2022-02-23 DIAGNOSIS — I776 Arteritis, unspecified: Secondary | ICD-10-CM | POA: Diagnosis not present

## 2022-02-24 DIAGNOSIS — J3089 Other allergic rhinitis: Secondary | ICD-10-CM | POA: Diagnosis not present

## 2022-02-24 DIAGNOSIS — J301 Allergic rhinitis due to pollen: Secondary | ICD-10-CM | POA: Diagnosis not present

## 2022-02-24 DIAGNOSIS — Z23 Encounter for immunization: Secondary | ICD-10-CM | POA: Diagnosis not present

## 2022-02-24 DIAGNOSIS — J3081 Allergic rhinitis due to animal (cat) (dog) hair and dander: Secondary | ICD-10-CM | POA: Diagnosis not present

## 2022-02-28 DIAGNOSIS — Z23 Encounter for immunization: Secondary | ICD-10-CM | POA: Diagnosis not present

## 2022-03-01 DIAGNOSIS — R351 Nocturia: Secondary | ICD-10-CM | POA: Diagnosis not present

## 2022-03-01 DIAGNOSIS — N3281 Overactive bladder: Secondary | ICD-10-CM | POA: Diagnosis not present

## 2022-03-01 DIAGNOSIS — N401 Enlarged prostate with lower urinary tract symptoms: Secondary | ICD-10-CM | POA: Diagnosis not present

## 2022-03-06 ENCOUNTER — Encounter: Payer: 59 | Admitting: Gastroenterology

## 2022-03-06 DIAGNOSIS — J3089 Other allergic rhinitis: Secondary | ICD-10-CM | POA: Diagnosis not present

## 2022-03-06 DIAGNOSIS — J301 Allergic rhinitis due to pollen: Secondary | ICD-10-CM | POA: Diagnosis not present

## 2022-03-16 DIAGNOSIS — Z7960 Long term (current) use of unspecified immunomodulators and immunosuppressants: Secondary | ICD-10-CM | POA: Diagnosis not present

## 2022-03-16 DIAGNOSIS — Z87891 Personal history of nicotine dependence: Secondary | ICD-10-CM | POA: Diagnosis not present

## 2022-03-16 DIAGNOSIS — M4802 Spinal stenosis, cervical region: Secondary | ICD-10-CM | POA: Diagnosis not present

## 2022-03-16 DIAGNOSIS — Z7952 Long term (current) use of systemic steroids: Secondary | ICD-10-CM | POA: Diagnosis not present

## 2022-03-16 DIAGNOSIS — I999 Unspecified disorder of circulatory system: Secondary | ICD-10-CM | POA: Diagnosis not present

## 2022-03-16 DIAGNOSIS — G6289 Other specified polyneuropathies: Secondary | ICD-10-CM | POA: Diagnosis not present

## 2022-03-16 DIAGNOSIS — R9389 Abnormal findings on diagnostic imaging of other specified body structures: Secondary | ICD-10-CM | POA: Diagnosis not present

## 2022-03-16 DIAGNOSIS — I776 Arteritis, unspecified: Secondary | ICD-10-CM | POA: Diagnosis not present

## 2022-03-16 DIAGNOSIS — I675 Moyamoya disease: Secondary | ICD-10-CM | POA: Diagnosis not present

## 2022-03-16 DIAGNOSIS — G629 Polyneuropathy, unspecified: Secondary | ICD-10-CM | POA: Diagnosis not present

## 2022-03-16 DIAGNOSIS — M810 Age-related osteoporosis without current pathological fracture: Secondary | ICD-10-CM | POA: Diagnosis not present

## 2022-03-16 DIAGNOSIS — E890 Postprocedural hypothyroidism: Secondary | ICD-10-CM | POA: Diagnosis not present

## 2022-03-16 DIAGNOSIS — Z79624 Long term (current) use of inhibitors of nucleotide synthesis: Secondary | ICD-10-CM | POA: Diagnosis not present

## 2022-03-16 DIAGNOSIS — D84821 Immunodeficiency due to drugs: Secondary | ICD-10-CM | POA: Diagnosis not present

## 2022-03-20 DIAGNOSIS — J3081 Allergic rhinitis due to animal (cat) (dog) hair and dander: Secondary | ICD-10-CM | POA: Diagnosis not present

## 2022-03-20 DIAGNOSIS — J301 Allergic rhinitis due to pollen: Secondary | ICD-10-CM | POA: Diagnosis not present

## 2022-03-20 DIAGNOSIS — J3089 Other allergic rhinitis: Secondary | ICD-10-CM | POA: Diagnosis not present

## 2022-03-23 DIAGNOSIS — I776 Arteritis, unspecified: Secondary | ICD-10-CM | POA: Diagnosis not present

## 2022-03-29 DIAGNOSIS — G4700 Insomnia, unspecified: Secondary | ICD-10-CM | POA: Diagnosis not present

## 2022-03-29 DIAGNOSIS — G4733 Obstructive sleep apnea (adult) (pediatric): Secondary | ICD-10-CM | POA: Diagnosis not present

## 2022-03-29 DIAGNOSIS — I10 Essential (primary) hypertension: Secondary | ICD-10-CM | POA: Diagnosis not present

## 2022-03-31 ENCOUNTER — Telehealth: Payer: Self-pay | Admitting: *Deleted

## 2022-03-31 DIAGNOSIS — Z7960 Long term (current) use of unspecified immunomodulators and immunosuppressants: Secondary | ICD-10-CM | POA: Diagnosis not present

## 2022-03-31 MED ORDER — ROSUVASTATIN CALCIUM 5 MG PO TABS
ORAL_TABLET | ORAL | 3 refills | Status: DC
Start: 2022-03-31 — End: 2022-09-14

## 2022-03-31 NOTE — Telephone Encounter (Signed)
refill 

## 2022-04-03 DIAGNOSIS — J3089 Other allergic rhinitis: Secondary | ICD-10-CM | POA: Diagnosis not present

## 2022-04-03 DIAGNOSIS — J301 Allergic rhinitis due to pollen: Secondary | ICD-10-CM | POA: Diagnosis not present

## 2022-04-03 DIAGNOSIS — J3081 Allergic rhinitis due to animal (cat) (dog) hair and dander: Secondary | ICD-10-CM | POA: Diagnosis not present

## 2022-04-07 DIAGNOSIS — Z1389 Encounter for screening for other disorder: Secondary | ICD-10-CM | POA: Diagnosis not present

## 2022-04-07 DIAGNOSIS — Z1211 Encounter for screening for malignant neoplasm of colon: Secondary | ICD-10-CM | POA: Diagnosis not present

## 2022-04-07 DIAGNOSIS — Z0000 Encounter for general adult medical examination without abnormal findings: Secondary | ICD-10-CM | POA: Diagnosis not present

## 2022-04-07 DIAGNOSIS — Z6833 Body mass index (BMI) 33.0-33.9, adult: Secondary | ICD-10-CM | POA: Diagnosis not present

## 2022-04-10 DIAGNOSIS — Z1211 Encounter for screening for malignant neoplasm of colon: Secondary | ICD-10-CM | POA: Diagnosis not present

## 2022-04-12 DIAGNOSIS — J3089 Other allergic rhinitis: Secondary | ICD-10-CM | POA: Diagnosis not present

## 2022-04-12 DIAGNOSIS — J3081 Allergic rhinitis due to animal (cat) (dog) hair and dander: Secondary | ICD-10-CM | POA: Diagnosis not present

## 2022-04-12 DIAGNOSIS — J301 Allergic rhinitis due to pollen: Secondary | ICD-10-CM | POA: Diagnosis not present

## 2022-04-13 ENCOUNTER — Ambulatory Visit: Payer: 59 | Admitting: Cardiology

## 2022-04-14 DIAGNOSIS — Z6833 Body mass index (BMI) 33.0-33.9, adult: Secondary | ICD-10-CM | POA: Diagnosis not present

## 2022-04-14 DIAGNOSIS — E7800 Pure hypercholesterolemia, unspecified: Secondary | ICD-10-CM | POA: Diagnosis not present

## 2022-04-14 DIAGNOSIS — R200 Anesthesia of skin: Secondary | ICD-10-CM | POA: Diagnosis not present

## 2022-04-19 ENCOUNTER — Encounter: Payer: Self-pay | Admitting: Podiatrist

## 2022-04-19 ENCOUNTER — Ambulatory Visit (INDEPENDENT_AMBULATORY_CARE_PROVIDER_SITE_OTHER): Payer: Medicare Other | Admitting: Podiatrist

## 2022-04-19 DIAGNOSIS — R200 Anesthesia of skin: Secondary | ICD-10-CM

## 2022-04-19 DIAGNOSIS — G629 Polyneuropathy, unspecified: Secondary | ICD-10-CM

## 2022-04-19 NOTE — Progress Notes (Signed)
Chief Complaint  Patient presents with   Foot Problem    Numbness in bilateral feet for 3 years, no prior treatment      HPI: Patient is 71 y.o. male who presents today for bilateral feet for 3 years duration.  He denies any trauma or injury precipitating the numbness and relates that the numbness happened all of a sudden.  Relates no pain.  No sharp or shooting discomfort.  No tingling pain is reported.  He relates he can feel the gas pedal and is able to feel the ground when he walks.  He has a neurologist in [place] who recommended he see a podiatrist since the numbness is in his feet primarily.  He denies any past treatment and states that the numbness does seem to be getting worse.  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 04/06/2020   Other persistent atrial fibrillation (HCC)    Bradycardia 02/15/2018   Agatston coronary artery calcium score between 100 and 199 02/15/2018   FUO (fever of unknown origin) 08/16/2017   Goiter 08/16/2017   Status post thyroidectomy 08/16/2017   Hypertension 08/16/2017   Dyslipidemia 08/16/2017   Obstructive sleep apnea 08/16/2017   Memory loss 07/25/2017   Enlarged prostate with urinary obstruction 01/04/2017   Prolapsed internal hemorrhoids, grade 3 09/13/2016   Eustachian tube dysfunction, bilateral 05/19/2016    Current Outpatient Medications on File Prior to Visit  Medication Sig Dispense Refill   alendronate (FOSAMAX) 10 MG tablet Take by mouth.     azaTHIOprine (IMURAN) 50 MG tablet Take 1 tablet by mouth daily.     Carboxymethylcellul-Glycerin (LUBRICATING  EYE DROPS OP) Place 1 drop into both eyes daily as needed (dry eyes).     diltiazem (CARDIZEM CD) 120 MG 24 hr capsule Take 1 capsule (120 mg total) by mouth 2 (two) times daily. 180 capsule 1   EPINEPHrine 0.3 mg/0.3 mL IJ SOAJ injection Inject 0.3 mg into the muscle as needed for anaphylaxis.     eszopiclone (LUNESTA) 2 MG TABS tablet Take 2 mg by mouth at bedtime.     famotidine-calcium carbonate-magnesium hydroxide (PEPCID COMPLETE) 10-800-165 MG chewable tablet Chew 1 tablet by mouth at bedtime as needed (acid reflux).     fluticasone (FLONASE) 50 MCG/ACT nasal spray Place 1 spray into both nostrils daily as needed for allergies or rhinitis. OTC Flonase     mycophenolate (CELLCEPT) 500 MG tablet Take 1,500 mg by mouth 3 (three) times daily.     predniSONE (DELTASONE) 5 MG tablet Take 7.5 mg by mouth daily.     rivaroxaban (XARELTO) 20 MG TABS tablet TAKE 1 TABLET(20 MG) BY MOUTH DAILY WITH SUPPER 90 tablet 1   rosuvastatin (CRESTOR) 5 MG tablet TAKE 1/2 TABLET(2.5 MG) BY MOUTH DAILY 45 tablet 3   Vibegron (GEMTESA) 75 MG TABS Take by mouth.     No current facility-administered medications on file prior to visit.    Allergies  Allergen Reactions   Other     Mints - makes him sneeze   Sulfa Antibiotics Hives and Rash   Sulfasalazine Hives and Other (See Comments)  Ambrosia Artemisiifolia (Ragweed) Skin Test Other (See Comments)    hayfever   Flagyl [Metronidazole] Other (See Comments)    Pt reported he was light headed for 2 weeks.      Review of Systems No fevers, chills, nausea, muscle aches, no difficulty breathing, no calf pain, no chest pain or shortness of breath.   Physical Exam  GENERAL APPEARANCE: Alert, conversant. Appropriately groomed. No acute distress.   VASCULAR: Pedal pulses palpable 2/4 DP and 1/4 PT bilateral.  Capillary refill time is immediate to all digits,  Proximal to distal cooling is warm to warm.  Digital perfusion adequate.  Multiple varicosities  are seen bilateral.  NEUROLOGIC: sensation is intact to 5.07 monofilament at 5/5 sites bilateral.  Light touch is intact bilateral, vibratory sensation intact bilateral.  No tingling or burning with tapping along the tarsal canal is noted bilateral.  MUSCULOSKELETAL: acceptable muscle strength, tone and stability bilateral.  No gross boney pedal deformities noted.  No pain, crepitus or limitation noted with foot and ankle range of motion bilateral.   DERMATOLOGIC: skin is warm, supple, and dry.  Color, texture, and turgor of skin within normal limits.  No open wounds are noted.  No preulcerative lesions are seen.  Digital nails are asymptomatic.      Assessment     ICD-10-CM   1. Neuropathy  G62.9 Ambulatory referral to Neurology    2. Numbness of feet  R20.0 Ambulatory referral to Neurology         Plan  Discussed exam findings with Cory Mathis and discussed that with the absence of sensation this is likely a nerve related issue/neuropathy.  Because there is no pain involved, no medications were recommended aside from an over-the-counter neuropathy medication that he might try.  He is interested in knowing the root cause of the neuropathy as he does not want to worsen and therefore recommended a neurology consult for further investigation.

## 2022-04-19 NOTE — Patient Instructions (Signed)
I will set you up to see a neurologist to have your nerves studied in your legs to see if we can find the root of your numbness.  You will receive a call to set this up.

## 2022-04-26 DIAGNOSIS — J3081 Allergic rhinitis due to animal (cat) (dog) hair and dander: Secondary | ICD-10-CM | POA: Diagnosis not present

## 2022-04-26 DIAGNOSIS — J3089 Other allergic rhinitis: Secondary | ICD-10-CM | POA: Diagnosis not present

## 2022-04-26 DIAGNOSIS — J301 Allergic rhinitis due to pollen: Secondary | ICD-10-CM | POA: Diagnosis not present

## 2022-05-03 DIAGNOSIS — Z7960 Long term (current) use of unspecified immunomodulators and immunosuppressants: Secondary | ICD-10-CM | POA: Diagnosis not present

## 2022-05-08 DIAGNOSIS — D225 Melanocytic nevi of trunk: Secondary | ICD-10-CM | POA: Diagnosis not present

## 2022-05-08 DIAGNOSIS — D2371 Other benign neoplasm of skin of right lower limb, including hip: Secondary | ICD-10-CM | POA: Diagnosis not present

## 2022-05-08 DIAGNOSIS — L814 Other melanin hyperpigmentation: Secondary | ICD-10-CM | POA: Diagnosis not present

## 2022-05-08 DIAGNOSIS — D485 Neoplasm of uncertain behavior of skin: Secondary | ICD-10-CM | POA: Diagnosis not present

## 2022-05-08 DIAGNOSIS — D2271 Melanocytic nevi of right lower limb, including hip: Secondary | ICD-10-CM | POA: Diagnosis not present

## 2022-05-08 DIAGNOSIS — L821 Other seborrheic keratosis: Secondary | ICD-10-CM | POA: Diagnosis not present

## 2022-05-08 DIAGNOSIS — D2272 Melanocytic nevi of left lower limb, including hip: Secondary | ICD-10-CM | POA: Diagnosis not present

## 2022-05-08 DIAGNOSIS — D1801 Hemangioma of skin and subcutaneous tissue: Secondary | ICD-10-CM | POA: Diagnosis not present

## 2022-05-08 DIAGNOSIS — D692 Other nonthrombocytopenic purpura: Secondary | ICD-10-CM | POA: Diagnosis not present

## 2022-05-10 DIAGNOSIS — J301 Allergic rhinitis due to pollen: Secondary | ICD-10-CM | POA: Diagnosis not present

## 2022-05-10 DIAGNOSIS — J3089 Other allergic rhinitis: Secondary | ICD-10-CM | POA: Diagnosis not present

## 2022-05-11 DIAGNOSIS — J3089 Other allergic rhinitis: Secondary | ICD-10-CM | POA: Diagnosis not present

## 2022-05-11 DIAGNOSIS — J3081 Allergic rhinitis due to animal (cat) (dog) hair and dander: Secondary | ICD-10-CM | POA: Diagnosis not present

## 2022-05-11 DIAGNOSIS — E7800 Pure hypercholesterolemia, unspecified: Secondary | ICD-10-CM | POA: Diagnosis not present

## 2022-05-11 DIAGNOSIS — J301 Allergic rhinitis due to pollen: Secondary | ICD-10-CM | POA: Diagnosis not present

## 2022-05-22 ENCOUNTER — Telehealth: Payer: Self-pay | Admitting: Internal Medicine

## 2022-05-22 NOTE — Telephone Encounter (Signed)
Pt c/o medication issue:  1. Name of Medication:   azaTHIOprine (IMURAN) 50 MG tablet     2. How are you currently taking this medication (dosage and times per day)? : Take 1 tablet by mouth daily. - Oral   3. Are you having a reaction (difficulty breathing--STAT)?   4. What is your medication issue? Pt was told to let Dr. Harrington Challenger know that he is on this medication and if he should continue to take or not. Please advise

## 2022-05-23 DIAGNOSIS — G939 Disorder of brain, unspecified: Secondary | ICD-10-CM | POA: Diagnosis not present

## 2022-05-24 DIAGNOSIS — J301 Allergic rhinitis due to pollen: Secondary | ICD-10-CM | POA: Diagnosis not present

## 2022-05-24 DIAGNOSIS — J3089 Other allergic rhinitis: Secondary | ICD-10-CM | POA: Diagnosis not present

## 2022-05-24 NOTE — Telephone Encounter (Signed)
Pt advised and will keep his appt 05/25/22.

## 2022-05-24 NOTE — Telephone Encounter (Signed)
The azathioprine is an immunosuppressive     Needs to review with his other doctors. From a cardiac standpoint it is OK to take

## 2022-05-25 ENCOUNTER — Encounter (HOSPITAL_COMMUNITY): Payer: Self-pay | Admitting: Physician Assistant

## 2022-05-25 ENCOUNTER — Ambulatory Visit (HOSPITAL_COMMUNITY)
Admission: RE | Admit: 2022-05-25 | Discharge: 2022-05-25 | Disposition: A | Discharge status: Home / Self Care | Payer: Medicare Other | Source: Ambulatory Visit | Attending: Physician Assistant | Admitting: Physician Assistant

## 2022-05-25 VITALS — BP 140/88 | HR 99 | Ht 70.5 in | Wt 233.0 lb

## 2022-05-25 DIAGNOSIS — I4819 Other persistent atrial fibrillation: Secondary | ICD-10-CM | POA: Diagnosis not present

## 2022-05-25 DIAGNOSIS — Z7901 Long term (current) use of anticoagulants: Secondary | ICD-10-CM | POA: Insufficient documentation

## 2022-05-25 DIAGNOSIS — I10 Essential (primary) hypertension: Secondary | ICD-10-CM | POA: Diagnosis not present

## 2022-05-25 DIAGNOSIS — I2510 Atherosclerotic heart disease of native coronary artery without angina pectoris: Secondary | ICD-10-CM | POA: Insufficient documentation

## 2022-05-25 DIAGNOSIS — G4733 Obstructive sleep apnea (adult) (pediatric): Secondary | ICD-10-CM | POA: Insufficient documentation

## 2022-05-25 DIAGNOSIS — D6869 Other thrombophilia: Secondary | ICD-10-CM | POA: Diagnosis not present

## 2022-05-25 DIAGNOSIS — Z79899 Other long term (current) drug therapy: Secondary | ICD-10-CM | POA: Insufficient documentation

## 2022-05-25 LAB — CBC
HCT: 43.3 % (ref 39.0–52.0)
Hemoglobin: 14.7 g/dL (ref 13.0–17.0)
MCH: 33 pg (ref 26.0–34.0)
MCHC: 33.9 g/dL (ref 30.0–36.0)
MCV: 97.3 fL (ref 80.0–100.0)
Platelets: 282 10*3/uL (ref 150–400)
RBC: 4.45 MIL/uL (ref 4.22–5.81)
RDW: 13.1 % (ref 11.5–15.5)
WBC: 11.6 10*3/uL — ABNORMAL HIGH (ref 4.0–10.5)
nRBC: 0 % (ref 0.0–0.2)

## 2022-05-25 LAB — BASIC METABOLIC PANEL
Anion gap: 8 (ref 5–15)
BUN: 17 mg/dL (ref 8–23)
CO2: 25 mmol/L (ref 22–32)
Calcium: 9.6 mg/dL (ref 8.9–10.3)
Chloride: 108 mmol/L (ref 98–111)
Creatinine, Ser: 1 mg/dL (ref 0.61–1.24)
GFR, Estimated: >60 mL/min (ref >60)
Glucose, Bld: 119 mg/dL — ABNORMAL HIGH (ref 70–99)
Potassium: 4.5 mmol/L (ref 3.5–5.1)
Sodium: 141 mmol/L (ref 135–145)

## 2022-05-25 NOTE — H&P (View-Only) (Signed)
Primary Care Physician: Vernie Shanks, MD (Inactive) Referring Physician: Oda Kilts, PA Cardiologist: Dr. Harrington Challenger  Primary EP: Dr Lorella Nimrod is a 71 y.o. male with a h/o persistent afib, bradycardia, HTN, OSA, treated with cpap, that is in the afib clinic s/p successful  cardioversion 02/08/21, scheduled by Oda Kilts.   EKG shows sinus brady at 54 bpm, not symptomatic with this.  Per [name]'s note, he preferred not to be on AAD's and would prefer ablation in the future if needed. He was not aware of the afib, just noted higher HR's on his apple watch and requested appointment with  Dr. Harrington Challenger. He is already scheduled with Dr. Quentin Ore 10/27. He had a basically normal echo in 2020 and is scheduled for a updated echo. He denies alcohol use, no tobacco, some caffeine, not excessive.    Follow up in the AF clinic 10/24/21. Patient reports that about 5 days ago he noted elevated heart rates on his Apple Watch and symptoms of "sluggishness". He is in afib today with elevated heart rates. He has recently been treated for a respiratory infection with abx and increased prednisone.   F/u in afib clinic, 02/01/22,  as pt has developed persistent afib for the last 2 days associated with fatigue and lightheadedness. He had been tapering off prednisone for his vasculitis for several weeks now, starting at 15 mg and reducing by 2.5 mg weekly. He is now down 7.5 mg daily. No known triggers for afib. He saw Dr. Quentin Ore in March and due to low afib burden, it was a wait and watch approach. He had afib in May, set up for cardioversion but spontaneously converted and now again in August. No missed xarelto.   Follow up in the AF clinic 05/25/22. Patient reports that one week ago he started having symptoms of fatigue and SOB. His smart watch has shown persistent afib since then. He did have an alcoholic drink just prior to the onset of his symptoms. No bleeding issues on anticoagulation.   Today, he denies  symptoms of chest pain, orthopnea, PND, lower extremity edema, dizziness, presyncope, syncope, or neurologic sequela. The patient is tolerating medications without difficulties and is otherwise without complaint today.   Past Medical History:  Diagnosis Date   Allergic rhinitis    Allergy    Atrial fibrillation with rapid ventricular response (HCC)    Benign neoplasm of right choroid    BPH (benign prostatic hyperplasia)    Brain tumor (benign) (HCC)    Cataract    bilateral,removed   Chest pain    Chronic headaches    Constipation    Diverticulosis of colon    Dysplastic nevus    Dysrhythmia 2019   Afib   Fatigue    Gastroesophageal reflux disease without esophagitis    GERD (gastroesophageal reflux disease)    prn med. control   H/O thyroidectomy    Headache    History of adenomatous polyp of colon    03-29-2007  tubular adenoma/  08-07-2016  hyperplastic and tubular adenoma's   History of alcoholic gastritis    15-17-6160  gastritis, esophagitis, peptic duodenitis   History of colonic diverticulitis    01-31-2016  resolved w/ medications   History of Helicobacter pylori infection    10-30-2008   History of thyroid nodule    thyroid multinodule goiter left side s/p  left thyroidectomy   Hypertension    Hypothyroidism    Insomnia    Internal hemorrhoids  Left leg weakness    Memory loss    Nocturia    OSA on CPAP    CPAP nightly   Paresthesia of both feet    Retinal pigmentation    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HCC)    Right thyroid nodule    Sinus bradycardia    Sleep apnea    c pap   Urinary retention 12/13/2016   Past Surgical History:  Procedure Laterality Date   APPLICATION OF CRANIAL NAVIGATION N/A 04/06/2020   Procedure: APPLICATION OF CRANIAL NAVIGATION;  Surgeon: Judith Part, MD;  Location: [facility];  Service: Neurosurgery;  Laterality: N/A;   BRAIN SURGERY     CARDIOVERSION N/A 12/23/2018   Procedure: CARDIOVERSION;  Surgeon: Buford Dresser,  MD;  Location[facility] ENDOSCOPY;  Service: Cardiovascular;  Laterality: N/A;   CARDIOVERSION N/A 02/08/2021   Procedure: CARDIOVERSION;  Surgeon: Fay Records, MD;  Location: [facility];  Service: Cardiovascular;  Laterality: N/A;   COLONOSCOPY  last one 08-07-2016   EYE SURGERY     FRAMELESS  BIOPSY WITH BRAINLAB Left 04/06/2020   Procedure: Left Stereotactic brain biopsy with brainlab;  Surgeon: Judith Part, MD;  Location: [facility];  Service: Neurosurgery;  Laterality: Left;   HEMORRHOID BANDING  10-20-2016;  09-11-2016   HERNIA REPAIR     KNEE ARTHROSCOPY Bilateral    10 + yrs ago   LASIK Bilateral    PILONIDAL CYST EXCISION     POLYPECTOMY     THYROIDECTOMY Left 2013   TRANSURETHRAL RESECTION OF PROSTATE N/A 01/04/2017   Procedure: TRANSURETHRAL RESECTION OF THE PROSTATE (TURP);  Surgeon: Franchot Gallo, MD;  Location: [facility];  Service: Urology;  Laterality: N/A;   UPPER GASTROINTESTINAL ENDOSCOPY  10/30/2008   WISDOM TOOTH EXTRACTION      Current Outpatient Medications  Medication Sig Dispense Refill   alendronate (FOSAMAX) 10 MG tablet Take by mouth.     azaTHIOprine (IMURAN) 50 MG tablet Take 100 mg by mouth daily.     Carboxymethylcellul-Glycerin (LUBRICATING EYE DROPS OP) Place 1 drop into both eyes daily as needed (dry eyes).     diltiazem (CARDIZEM CD) 120 MG 24 hr capsule Take 1 capsule (120 mg total) by mouth 2 (two) times daily. 180 capsule 1   EPINEPHrine 0.3 mg/0.3 mL IJ SOAJ injection Inject 0.3 mg into the muscle as needed for anaphylaxis.     eszopiclone (LUNESTA) 2 MG TABS tablet Take 2 mg by mouth at bedtime.     famotidine-calcium carbonate-magnesium hydroxide (PEPCID COMPLETE) 10-800-165 MG chewable tablet Chew 1 tablet by mouth at bedtime as needed (acid reflux).     fluticasone (FLONASE) 50 MCG/ACT nasal spray Place 1 spray into both nostrils daily as needed for allergies or rhinitis. OTC Flonase     mycophenolate (CELLCEPT) 500 MG  tablet Take 1,500 mg by mouth 3 (three) times daily.     predniSONE (DELTASONE) 10 MG tablet Take 20 mg by mouth daily.     rivaroxaban (XARELTO) 20 MG TABS tablet TAKE 1 TABLET(20 MG) BY MOUTH DAILY WITH SUPPER 90 tablet 1   rosuvastatin (CRESTOR) 5 MG tablet TAKE 1/2 TABLET(2.5 MG) BY MOUTH DAILY 45 tablet 3   Vibegron (GEMTESA) 75 MG TABS Take by mouth.     No current facility-administered medications for this encounter.    Allergies  Allergen Reactions   Other     Mints - makes him sneeze   Sulfa Antibiotics Hives and Rash   Sulfasalazine Hives and Other (See  Comments)   Ambrosia Artemisiifolia (Ragweed) Skin Test Other (See Comments)    hayfever   Flagyl [Metronidazole] Other (See Comments)    Pt reported he was light headed for 2 weeks.      Social History   Socioeconomic History   Marital status: Married    Spouse name: Not on file   Number of children: 0   Years of education: 16+   Highest education level: Bachelor's degree (e.g., BA, AB, BS)  Occupational History   Occupation: Retired  Tobacco Use   Smoking status: Former   Smokeless tobacco: Never   Tobacco comments:    Former smoker 10/24/21  Vaping Use   Vaping Use: Never used  Substance and Sexual Activity   Alcohol use: Yes    Comment: social    Drug use: No   Sexual activity: Not on file  Other Topics Concern   Not on file  Social History Narrative   Left-handed.   1.5 cups caffeine per day.   Lives at home with his wife.   Social Determinants of Health   Financial Resource Strain: Not on file  Food Insecurity: Not on file  Transportation Needs: Not on file  Physical Activity: Not on file  Stress: Not on file  Social Connections: Not on file  Intimate Partner Violence: Not on file    Family History  Problem Relation Age of Onset   Heart attack Mother        70's   Other Father        atherosclerosis - 11's   Other Sister        brain tumor - unsure if it was cancer - 84's    Prostate cancer Maternal Grandfather    Colon cancer Neg Hx    Esophageal cancer Neg Hx    Pancreatic cancer Neg Hx    Rectal cancer Neg Hx    Stomach cancer Neg Hx    Colon polyps Neg Hx    Crohn's disease Neg Hx     ROS- All systems are reviewed and negative except as per the HPI above  Physical Exam: Vitals:   05/25/22 1516  BP: (!) 140/88  Pulse: 99  Weight: 105.7 kg  Height: 5' 10.5" (1.791 m)     Wt Readings from Last 3 Encounters:  05/25/22 105.7 kg  02/01/22 101.9 kg  12/01/21 102.5 kg    Labs: Lab Results  Component Value Date   NA 140 02/01/2022   K 4.4 02/01/2022   CL 109 02/01/2022   CO2 24 02/01/2022   GLUCOSE 102 (H) 02/01/2022   BUN 12 02/01/2022   CREATININE 1.04 02/01/2022   CALCIUM 9.4 02/01/2022   Lab Results  Component Value Date   INR 1.8 (H) 05/24/2021   Lab Results  Component Value Date   CHOL 106 08/22/2019   HDL 38 (L) 08/22/2019   LDLCALC 47 08/22/2019   TRIG 118 08/22/2019     GEN- The patient is a well appearing male, alert and oriented x 3 today.   HEENT-head normocephalic, atraumatic, sclera clear, conjunctiva pink, hearing intact, trachea midline. Lungs- Clear to ausculation bilaterally, normal work of breathing Heart- irregular rate and rhythm, no murmurs, rubs or gallops  GI- soft, NT, ND, + BS Extremities- no clubbing, cyanosis, or edema MS- no significant deformity or atrophy Skin- no rash or lesion Psych- euthymic mood, full affect Neuro- strength and sensation are intact   EKG- Afib, RBBB Vent. rate 99 BPM PR interval * ms  QRS duration 124 ms QT/QTcB 358/459 ms   CHA2DS2-VASc Score = 3  The patient's score is based upon: CHF History: 0 HTN History: 1 Diabetes History: 0 Stroke History: 0 Vascular Disease History: 1 Age Score: 1 Gender Score: 0       ASSESSMENT AND PLAN: 1. Persistent Atrial Fibrillation (ICD10:  I48.19) The patient's CHA2DS2-VASc score is 3, indicating a 3.2% annual risk of  stroke.   Patient back in persistent afib. ? Alcohol trigger Will arrange for DCCV. Patient is NOT interested in afib ablation.  Continue  diltiazem 120 mg BID  Continue Xarelto 20 mg daily, patient denies any missed doses in the past 3 weeks.  Apple Watch for home monitoring.   2. Secondary Hypercoagulable State (ICD10:  D68.69) The patient is at significant risk for stroke/thromboembolism based upon his CHA2DS2-VASc Score of 3.  Continue Rivaroxaban (Xarelto).   3. HTN Stable, no changes today.  4. CAD CAC score 186 No anginal symptoms.   Follow up in the AF clinic 1-2 weeks post DCCV.    [facility] [address] [place], [place] [address] [phone]

## 2022-05-25 NOTE — Patient Instructions (Addendum)
Cardioversion scheduled for: Thursday, December 21st   - Arrive at the [facility] and go to admitting at [facility] not eat or drink anything after midnight the night prior to your procedure.   - Take all your morning medication (except diabetic medications) with a sip of water prior to arrival. - You will not be able to drive home after your procedure.    - Do NOT miss any doses of your blood thinner - if you should miss a dose please notify our office immediately.   - If you feel as if you go back into normal rhythm prior to scheduled cardioversion, please notify our office immediately.  If your procedure is canceled in the cardioversion suite you will be charged a cancellation fee.

## 2022-05-25 NOTE — Progress Notes (Signed)
Primary Care Physician: Vernie Shanks, MD (Inactive) Referring Physician: Oda Kilts, PA Cardiologist: Dr. Harrington Challenger  Primary EP: Dr Lorella Nimrod is a 71 y.o. male with a h/o persistent afib, bradycardia, HTN, OSA, treated with cpap, that is in the afib clinic s/p successful  cardioversion 02/08/21, scheduled by Oda Kilts.   EKG shows sinus brady at 54 bpm, not symptomatic with this.  Per [name]'s note, he preferred not to be on AAD's and would prefer ablation in the future if needed. He was not aware of the afib, just noted higher HR's on his apple watch and requested appointment with  Dr. Harrington Challenger. He is already scheduled with Dr. Quentin Ore 10/27. He had a basically normal echo in 2020 and is scheduled for a updated echo. He denies alcohol use, no tobacco, some caffeine, not excessive.    Follow up in the AF clinic 10/24/21. Patient reports that about 5 days ago he noted elevated heart rates on his Apple Watch and symptoms of "sluggishness". He is in afib today with elevated heart rates. He has recently been treated for a respiratory infection with abx and increased prednisone.   F/u in afib clinic, 02/01/22,  as pt has developed persistent afib for the last 2 days associated with fatigue and lightheadedness. He had been tapering off prednisone for his vasculitis for several weeks now, starting at 15 mg and reducing by 2.5 mg weekly. He is now down 7.5 mg daily. No known triggers for afib. He saw Dr. Quentin Ore in March and due to low afib burden, it was a wait and watch approach. He had afib in May, set up for cardioversion but spontaneously converted and now again in August. No missed xarelto.   Follow up in the AF clinic 05/25/22. Patient reports that one week ago he started having symptoms of fatigue and SOB. His smart watch has shown persistent afib since then. He did have an alcoholic drink just prior to the onset of his symptoms. No bleeding issues on anticoagulation.   Today, he denies  symptoms of chest pain, orthopnea, PND, lower extremity edema, dizziness, presyncope, syncope, or neurologic sequela. The patient is tolerating medications without difficulties and is otherwise without complaint today.   Past Medical History:  Diagnosis Date   Allergic rhinitis    Allergy    Atrial fibrillation with rapid ventricular response (HCC)    Benign neoplasm of right choroid    BPH (benign prostatic hyperplasia)    Brain tumor (benign) (HCC)    Cataract    bilateral,removed   Chest pain    Chronic headaches    Constipation    Diverticulosis of colon    Dysplastic nevus    Dysrhythmia 2019   Afib   Fatigue    Gastroesophageal reflux disease without esophagitis    GERD (gastroesophageal reflux disease)    prn med. 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   Hypertension    Hypothyroidism    Insomnia    Internal hemorrhoids  Left leg weakness    Memory loss    Nocturia    OSA on CPAP    CPAP nightly   Paresthesia of both feet    Retinal pigmentation    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HCC)    Right thyroid nodule    Sinus bradycardia    Sleep apnea    c pap   Urinary retention 12/13/2016   Past Surgical History:  Procedure Laterality Date   APPLICATION OF CRANIAL NAVIGATION N/A 04/06/2020   Procedure: APPLICATION OF CRANIAL NAVIGATION;  Surgeon: Judith Part, MD;  Location: [place];  Service: Neurosurgery;  Laterality: N/A;   BRAIN SURGERY     CARDIOVERSION N/A 12/23/2018   Procedure: CARDIOVERSION;  Surgeon: Buford Dresser,  MD;  Location: [facility] ENDOSCOPY;  Service: Cardiovascular;  Laterality: N/A;   CARDIOVERSION N/A 02/08/2021   Procedure: CARDIOVERSION;  Surgeon: Fay Records, MD;  Location: [place];  Service: Cardiovascular;  Laterality: N/A;   COLONOSCOPY  last one 08-07-2016   EYE SURGERY     FRAMELESS  BIOPSY WITH BRAINLAB Left 04/06/2020   Procedure: Left Stereotactic brain biopsy with brainlab;  Surgeon: Judith Part, MD;  Location: [place];  Service: Neurosurgery;  Laterality: Left;   HEMORRHOID BANDING  10-20-2016;  09-11-2016   HERNIA REPAIR     KNEE ARTHROSCOPY Bilateral    10 + yrs ago   LASIK Bilateral    PILONIDAL CYST EXCISION     POLYPECTOMY     THYROIDECTOMY Left 2013   TRANSURETHRAL RESECTION OF PROSTATE N/A 01/04/2017   Procedure: TRANSURETHRAL RESECTION OF THE PROSTATE (TURP);  Surgeon: Franchot Gallo, MD;  Location: [facility];  Service: Urology;  Laterality: N/A;   UPPER GASTROINTESTINAL ENDOSCOPY  10/30/2008   WISDOM TOOTH EXTRACTION      Current Outpatient Medications  Medication Sig Dispense Refill   alendronate (FOSAMAX) 10 MG tablet Take by mouth.     azaTHIOprine (IMURAN) 50 MG tablet Take 100 mg by mouth daily.     Carboxymethylcellul-Glycerin (LUBRICATING EYE DROPS OP) Place 1 drop into both eyes daily as needed (dry eyes).     diltiazem (CARDIZEM CD) 120 MG 24 hr capsule Take 1 capsule (120 mg total) by mouth 2 (two) times daily. 180 capsule 1   EPINEPHrine 0.3 mg/0.3 mL IJ SOAJ injection Inject 0.3 mg into the muscle as needed for anaphylaxis.     eszopiclone (LUNESTA) 2 MG TABS tablet Take 2 mg by mouth at bedtime.     famotidine-calcium carbonate-magnesium hydroxide (PEPCID COMPLETE) 10-800-165 MG chewable tablet Chew 1 tablet by mouth at bedtime as needed (acid reflux).     fluticasone (FLONASE) 50 MCG/ACT nasal spray Place 1 spray into both nostrils daily as needed for allergies or rhinitis. OTC Flonase     mycophenolate (CELLCEPT) 500 MG  tablet Take 1,500 mg by mouth 3 (three) times daily.     predniSONE (DELTASONE) 10 MG tablet Take 20 mg by mouth daily.     rivaroxaban (XARELTO) 20 MG TABS tablet TAKE 1 TABLET(20 MG) BY MOUTH DAILY WITH SUPPER 90 tablet 1   rosuvastatin (CRESTOR) 5 MG tablet TAKE 1/2 TABLET(2.5 MG) BY MOUTH DAILY 45 tablet 3   Vibegron (GEMTESA) 75 MG TABS Take by mouth.     No current facility-administered medications for this encounter.    Allergies  Allergen Reactions   Other     Mints - makes him sneeze   Sulfa Antibiotics Hives and Rash   Sulfasalazine Hives and Other (See  Comments)   Ambrosia Artemisiifolia (Ragweed) Skin Test Other (See Comments)    hayfever   Flagyl [Metronidazole] Other (See Comments)    Pt reported he was light headed for 2 weeks.      Social History   Socioeconomic History   Marital status: Married    Spouse name: Not on file   Number of children: 0   Years of education: 16+   Highest education level: Bachelor's degree (e.g., BA, AB, BS)  Occupational History   Occupation: Retired  Tobacco Use   Smoking status: Former   Smokeless tobacco: Never   Tobacco comments:    Former smoker 10/24/21  Vaping Use   Vaping Use: Never used  Substance and Sexual Activity   Alcohol use: Yes    Comment: social    Drug use: No   Sexual activity: Not on file  Other Topics Concern   Not on file  Social History Narrative   Left-handed.   1.5 cups caffeine per day.   Lives at home with his wife.   Social Determinants of Health   Financial Resource Strain: Not on file  Food Insecurity: Not on file  Transportation Needs: Not on file  Physical Activity: Not on file  Stress: Not on file  Social Connections: Not on file  Intimate Partner Violence: Not on file    Family History  Problem Relation Age of Onset   Heart attack Mother        46's   Other Father        atherosclerosis - 31's   Other Sister        brain tumor - unsure if it was cancer - 25's    Prostate cancer Maternal Grandfather    Colon cancer Neg Hx    Esophageal cancer Neg Hx    Pancreatic cancer Neg Hx    Rectal cancer Neg Hx    Stomach cancer Neg Hx    Colon polyps Neg Hx    Crohn's disease Neg Hx     ROS- All systems are reviewed and negative except as per the HPI above  Physical Exam: Vitals:   05/25/22 1516  BP: (!) 140/88  Pulse: 99  Weight: 105.7 kg  Height: 5' 10.5" (1.791 m)     Wt Readings from Last 3 Encounters:  05/25/22 105.7 kg  02/01/22 101.9 kg  12/01/21 102.5 kg    Labs: Lab Results  Component Value Date   NA 140 02/01/2022   K 4.4 02/01/2022   CL 109 02/01/2022   CO2 24 02/01/2022   GLUCOSE 102 (H) 02/01/2022   BUN 12 02/01/2022   CREATININE 1.04 02/01/2022   CALCIUM 9.4 02/01/2022   Lab Results  Component Value Date   INR 1.8 (H) 05/24/2021   Lab Results  Component Value Date   CHOL 106 08/22/2019   HDL 38 (L) 08/22/2019   LDLCALC 47 08/22/2019   TRIG 118 08/22/2019     GEN- The patient is a well appearing male, alert and oriented x 3 today.   HEENT-head normocephalic, atraumatic, sclera clear, conjunctiva pink, hearing intact, trachea midline. Lungs- Clear to ausculation bilaterally, normal work of breathing Heart- irregular rate and rhythm, no murmurs, rubs or gallops  GI- soft, NT, ND, + BS Extremities- no clubbing, cyanosis, or edema MS- no significant deformity or atrophy Skin- no rash or lesion Psych- euthymic mood, full affect Neuro- strength and sensation are intact   EKG- Afib, RBBB Vent. rate 99 BPM PR interval * ms  QRS duration 124 ms QT/QTcB 358/459 ms   CHA2DS2-VASc Score = 3  The patient's score is based upon: CHF History: 0 HTN History: 1 Diabetes History: 0 Stroke History: 0 Vascular Disease History: 1 Age Score: 1 Gender Score: 0       ASSESSMENT AND PLAN: 1. Persistent Atrial Fibrillation (ICD10:  I48.19) The patient's CHA2DS2-VASc score is 3, indicating a 3.2% annual risk of  stroke.   Patient back in persistent afib. ? Alcohol trigger Will arrange for DCCV. Patient is NOT interested in afib ablation.  Continue  diltiazem 120 mg BID  Continue Xarelto 20 mg daily, patient denies any missed doses in the past 3 weeks.  Apple Watch for home monitoring.   2. Secondary Hypercoagulable State (ICD10:  D68.69) The patient is at significant risk for stroke/thromboembolism based upon his CHA2DS2-VASc Score of 3.  Continue Rivaroxaban (Xarelto).   3. HTN Stable, no changes today.  4. CAD CAC score 186 No anginal symptoms.   Follow up in the AF clinic 1-2 weeks post DCCV.    [facility] [address] [place], [place] [address] [phone]

## 2022-06-01 ENCOUNTER — Encounter (HOSPITAL_COMMUNITY): Payer: Self-pay | Admitting: Cardiology

## 2022-06-01 ENCOUNTER — Ambulatory Visit (HOSPITAL_COMMUNITY): Payer: Medicare Other | Admitting: Anesthesiology

## 2022-06-01 ENCOUNTER — Ambulatory Visit (HOSPITAL_COMMUNITY)
Admission: RE | Admit: 2022-06-01 | Discharge: 2022-06-01 | Disposition: A | Discharge status: Home / Self Care | Payer: Medicare Other | Source: Ambulatory Visit | Attending: Cardiology | Admitting: Cardiology

## 2022-06-01 ENCOUNTER — Ambulatory Visit (HOSPITAL_BASED_OUTPATIENT_CLINIC_OR_DEPARTMENT_OTHER): Payer: Medicare Other | Admitting: Anesthesiology

## 2022-06-01 ENCOUNTER — Encounter (HOSPITAL_COMMUNITY)
Admission: RE | Disposition: A | Discharge status: Home / Self Care | Payer: Self-pay | Source: Ambulatory Visit | Attending: Cardiology

## 2022-06-01 DIAGNOSIS — Z87891 Personal history of nicotine dependence: Secondary | ICD-10-CM | POA: Insufficient documentation

## 2022-06-01 DIAGNOSIS — I4891 Unspecified atrial fibrillation: Secondary | ICD-10-CM

## 2022-06-01 DIAGNOSIS — E039 Hypothyroidism, unspecified: Secondary | ICD-10-CM | POA: Diagnosis not present

## 2022-06-01 DIAGNOSIS — K219 Gastro-esophageal reflux disease without esophagitis: Secondary | ICD-10-CM | POA: Insufficient documentation

## 2022-06-01 DIAGNOSIS — Z6833 Body mass index (BMI) 33.0-33.9, adult: Secondary | ICD-10-CM | POA: Diagnosis not present

## 2022-06-01 DIAGNOSIS — I2510 Atherosclerotic heart disease of native coronary artery without angina pectoris: Secondary | ICD-10-CM | POA: Insufficient documentation

## 2022-06-01 DIAGNOSIS — G4733 Obstructive sleep apnea (adult) (pediatric): Secondary | ICD-10-CM | POA: Diagnosis not present

## 2022-06-01 DIAGNOSIS — D6869 Other thrombophilia: Secondary | ICD-10-CM | POA: Insufficient documentation

## 2022-06-01 DIAGNOSIS — I10 Essential (primary) hypertension: Secondary | ICD-10-CM | POA: Insufficient documentation

## 2022-06-01 DIAGNOSIS — Z86011 Personal history of benign neoplasm of the brain: Secondary | ICD-10-CM | POA: Diagnosis not present

## 2022-06-01 DIAGNOSIS — E669 Obesity, unspecified: Secondary | ICD-10-CM | POA: Diagnosis not present

## 2022-06-01 DIAGNOSIS — I4819 Other persistent atrial fibrillation: Secondary | ICD-10-CM | POA: Diagnosis not present

## 2022-06-01 HISTORY — PX: CARDIOVERSION: SHX1299

## 2022-06-01 SURGERY — CARDIOVERSION
Anesthesia: General

## 2022-06-01 MED ORDER — SODIUM CHLORIDE 0.9 % IV SOLN: INTRAVENOUS | Status: DC

## 2022-06-01 MED ORDER — LIDOCAINE 2% (20 MG/ML) 5 ML SYRINGE
INTRAMUSCULAR | Status: DC | PRN
  Administered 2022-06-01: 60 mg via INTRAVENOUS

## 2022-06-01 MED ORDER — PROPOFOL 10 MG/ML IV BOLUS
INTRAVENOUS | Status: DC | PRN
  Administered 2022-06-01: 80 mg via INTRAVENOUS

## 2022-06-01 NOTE — Anesthesia Preprocedure Evaluation (Addendum)
Anesthesia Evaluation  Patient identified by MRN, date of birth, ID band Patient awake    Reviewed: Allergy & Precautions, NPO status , Patient's Chart, lab work & pertinent test results  History of Anesthesia Complications Negative for: history of anesthetic complications  Airway Mallampati: III  TM Distance: >3 FB Neck ROM: Full    Dental  (+) Dental Advisory Given   Pulmonary sleep apnea and Continuous Positive Airway Pressure Ventilation , former smoker   Pulmonary exam normal        Cardiovascular hypertension, + CAD  + dysrhythmias Atrial Fibrillation  Rhythm:Irregular Rate:Normal     Neuro/Psych  Headaches  Brain tumor   negative psych ROS   GI/Hepatic Neg liver ROS,GERD  Medicated and Controlled,,  Endo/Other  Hypothyroidism   Obesity   Renal/GU negative Renal ROS     Musculoskeletal negative musculoskeletal ROS (+)    Abdominal   Peds  Hematology  On xarelto    Anesthesia Other Findings   Reproductive/Obstetrics                             Anesthesia Physical Anesthesia Plan  ASA: 3  Anesthesia Plan: General   Post-op Pain Management: Minimal or no pain anticipated   Induction: Intravenous  PONV Risk Score and Plan: 2 and Treatment may vary due to age or medical condition and Propofol infusion  Airway Management Planned: Natural Airway and Mask  Additional Equipment: None  Intra-op Plan:   Post-operative Plan:   Informed Consent: I have reviewed the patients History and Physical, chart, labs and discussed the procedure including the risks, benefits and alternatives for the proposed anesthesia with the patient or authorized representative who has indicated his/her understanding and acceptance.       Plan Discussed with: CRNA and Anesthesiologist  Anesthesia Plan Comments:        Anesthesia Quick Evaluation

## 2022-06-01 NOTE — CV Procedure (Signed)
Procedure:   DCCV  Indication:  Symptomatic atrial fibrillation  Procedure Note:  The patient signed informed consent.  They have had had therapeutic anticoagulation with Xarelto greater than 3 weeks.  Anesthesia was administered by Dr. Fransisco Beau and Rejeana Brock, CRNA.  Adequate airway was maintained throughout and vital followed per protocol.  They were cardioverted x 1 with 200J of biphasic synchronized energy.  They converted to NSR with rate 60s.  There were no apparent complications.  The patient had normal neuro status and respiratory status post procedure with vitals stable as recorded elsewhere.    Follow up:  They will continue on current medical therapy and follow up with cardiology as scheduled.  Oswaldo Milian, MD 06/01/2022 10:48 AM

## 2022-06-01 NOTE — Discharge Instructions (Signed)

## 2022-06-01 NOTE — Interval H&P Note (Signed)
History and Physical Interval Note:  06/01/2022 10:19 AM  Cory Mathis  has presented today for surgery, with the diagnosis of ATRIAL FIBRILLATION.  The various methods of treatment have been discussed with the patient and family. After consideration of risks, benefits and other options for treatment, the patient has consented to  Procedure(s): CARDIOVERSION (N/A) as a surgical intervention.  The patient's history has been reviewed, patient examined, no change in status, stable for surgery.  I have reviewed the patient's chart and labs.  Questions were answered to the patient's satisfaction.     [name]

## 2022-06-01 NOTE — Transfer of Care (Signed)
Immediate Anesthesia Transfer of Care Note  Patient: Cory Mathis  Procedure(s) Performed: CARDIOVERSION  Patient Location: Endoscopy Unit  Anesthesia Type:General  Level of Consciousness: awake  Airway & Oxygen Therapy: Patient Spontanous Breathing  Post-op Assessment: Report given to RN and Post -op Vital signs reviewed and stable  Post vital signs: Reviewed and stable  Last Vitals:  Vitals Value Taken Time  BP    Temp    Pulse    Resp    SpO2      Last Pain:  Vitals:   06/01/22 1023  TempSrc: Tympanic  PainSc: 0-No pain         Complications: No notable events documented.

## 2022-06-01 NOTE — Anesthesia Postprocedure Evaluation (Signed)
Anesthesia Post Note  Patient: Cory Mathis  Procedure(s) Performed: CARDIOVERSION     Patient location during evaluation: PACU Anesthesia Type: General Level of consciousness: awake and alert Pain management: pain level controlled Vital Signs Assessment: post-procedure vital signs reviewed and stable Respiratory status: spontaneous breathing, nonlabored ventilation and respiratory function stable Cardiovascular status: stable and blood pressure returned to baseline Anesthetic complications: no   No notable events documented.  Last Vitals:  Vitals:   06/01/22 1103 06/01/22 1113  BP: 119/65 120/71  Pulse: 64 63  Resp: 14 10  Temp:    SpO2: 93% 92%    Last Pain:  Vitals:   06/01/22 1113  TempSrc:   PainSc: 0-No pain                 Audry Pili

## 2022-06-05 ENCOUNTER — Encounter (HOSPITAL_COMMUNITY): Payer: Self-pay | Admitting: Cardiology

## 2022-06-07 DIAGNOSIS — J301 Allergic rhinitis due to pollen: Secondary | ICD-10-CM | POA: Diagnosis not present

## 2022-06-07 DIAGNOSIS — J3081 Allergic rhinitis due to animal (cat) (dog) hair and dander: Secondary | ICD-10-CM | POA: Diagnosis not present

## 2022-06-07 DIAGNOSIS — J3089 Other allergic rhinitis: Secondary | ICD-10-CM | POA: Diagnosis not present

## 2022-06-13 ENCOUNTER — Other Ambulatory Visit: Payer: Self-pay | Admitting: Nephrology

## 2022-06-13 DIAGNOSIS — Z7960 Long term (current) use of unspecified immunomodulators and immunosuppressants: Secondary | ICD-10-CM | POA: Diagnosis not present

## 2022-06-13 DIAGNOSIS — R3129 Other microscopic hematuria: Secondary | ICD-10-CM

## 2022-06-15 ENCOUNTER — Encounter (HOSPITAL_COMMUNITY): Payer: Self-pay | Admitting: Physician Assistant

## 2022-06-15 ENCOUNTER — Ambulatory Visit (HOSPITAL_COMMUNITY)
Admission: RE | Admit: 2022-06-15 | Discharge: 2022-06-15 | Disposition: A | Discharge status: Home / Self Care | Payer: Medicare Other | Source: Ambulatory Visit | Attending: Physician Assistant | Admitting: Physician Assistant

## 2022-06-15 VITALS — BP 142/70 | HR 52 | Ht 70.0 in | Wt 234.0 lb

## 2022-06-15 DIAGNOSIS — I2510 Atherosclerotic heart disease of native coronary artery without angina pectoris: Secondary | ICD-10-CM | POA: Diagnosis not present

## 2022-06-15 DIAGNOSIS — R001 Bradycardia, unspecified: Secondary | ICD-10-CM | POA: Insufficient documentation

## 2022-06-15 DIAGNOSIS — G4733 Obstructive sleep apnea (adult) (pediatric): Secondary | ICD-10-CM | POA: Diagnosis not present

## 2022-06-15 DIAGNOSIS — Z7901 Long term (current) use of anticoagulants: Secondary | ICD-10-CM | POA: Insufficient documentation

## 2022-06-15 DIAGNOSIS — D6869 Other thrombophilia: Secondary | ICD-10-CM

## 2022-06-15 DIAGNOSIS — I4819 Other persistent atrial fibrillation: Secondary | ICD-10-CM | POA: Diagnosis not present

## 2022-06-15 DIAGNOSIS — I10 Essential (primary) hypertension: Secondary | ICD-10-CM | POA: Insufficient documentation

## 2022-06-15 NOTE — Progress Notes (Signed)
Primary Care Physician: Vernie Shanks, MD (Inactive) Referring Physician: Oda Kilts, PA Cardiologist: Dr. Harrington Challenger  Primary EP: Dr Lorella Nimrod is a 72 y.o. male with a h/o persistent afib, bradycardia, HTN, OSA, treated with cpap, that is in the afib clinic s/p successful  cardioversion 02/08/21, scheduled by Oda Kilts.   EKG shows sinus brady at 54 bpm, not symptomatic with this.  Per [name]'s note, he preferred not to be on AAD's and would prefer ablation in the future if needed. He was not aware of the afib, just noted higher HR's on his apple watch and requested appointment with  Dr. Harrington Challenger. He is already scheduled with Dr. Quentin Ore 10/27. He had a basically normal echo in 2020 and is scheduled for a updated echo. He denies alcohol use, no tobacco, some caffeine, not excessive.    Follow up in the AF clinic 10/24/21. Patient reports that about 5 days ago he noted elevated heart rates on his Apple Watch and symptoms of "sluggishness". He is in afib today with elevated heart rates. He has recently been treated for a respiratory infection with abx and increased prednisone.   F/u in afib clinic, 02/01/22,  as pt has developed persistent afib for the last 2 days associated with fatigue and lightheadedness. He had been tapering off prednisone for his vasculitis for several weeks now, starting at 15 mg and reducing by 2.5 mg weekly. He is now down 7.5 mg daily. No known triggers for afib. He saw Dr. Quentin Ore in March and due to low afib burden, it was a wait and watch approach. He had afib in May, set up for cardioversion but spontaneously converted and now again in August. No missed xarelto.   Follow up in the AF clinic 05/25/22. Patient reports that one week ago he started having symptoms of fatigue and SOB. His smart watch has shown persistent afib since then. He did have an alcoholic drink just prior to the onset of his symptoms. No bleeding issues on anticoagulation.   Follow up in the AF  clinic 06/15/22. Patient is s/p DCCV 06/01/22. He is back in SR with resolution of his symptoms.   Today, he denies symptoms of palpitations, chest pain, orthopnea, PND, lower extremity edema, dizziness, presyncope, syncope, or neurologic sequela. The patient is tolerating medications without difficulties and is otherwise without complaint today.   Past Medical History:  Diagnosis Date   Allergic rhinitis    Allergy    Atrial fibrillation with rapid ventricular response (HCC)    Benign neoplasm of right choroid    BPH (benign prostatic hyperplasia)    Brain tumor (benign) (HCC)    Cataract    bilateral,removed   Chest pain    Chronic headaches    Constipation    Diverticulosis of colon    Dysplastic nevus    Dysrhythmia 2019   Afib   Fatigue    Gastroesophageal reflux disease without esophagitis    GERD (gastroesophageal reflux disease)    prn med. control   H/O thyroidectomy    Headache    History of adenomatous polyp of colon    03-29-2007  tubular adenoma/  08-07-2016  hyperplastic and tubular adenoma's   History of alcoholic gastritis    [id number]  gastritis, esophagitis, peptic duodenitis   History of colonic diverticulitis    01-31-2016  resolved w/ medications   History of Helicobacter pylori infection    10-30-2008   History of thyroid nodule    thyroid multinodule  goiter left side s/p  left thyroidectomy   Hypertension    Hypothyroidism    Insomnia    Internal hemorrhoids    Left leg weakness    Memory loss    Nocturia    OSA on CPAP    CPAP nightly   Paresthesia of both feet    Retinal pigmentation    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HCC)    Right thyroid nodule    Sinus bradycardia    Sleep apnea    c pap   Urinary retention 12/13/2016   Past Surgical History:  Procedure Laterality Date   APPLICATION OF CRANIAL NAVIGATION N/A 04/06/2020   Procedure: APPLICATION OF CRANIAL NAVIGATION;  Surgeon: Judith Part, MD;  Location: [place];  Service: Neurosurgery;   Laterality: N/A;   BRAIN SURGERY     CARDIOVERSION N/A 12/23/2018   Procedure: CARDIOVERSION;  Surgeon: Buford Dresser, MD;  Location: [facility] ENDOSCOPY;  Service: Cardiovascular;  Laterality: N/A;   CARDIOVERSION N/A 02/08/2021   Procedure: CARDIOVERSION;  Surgeon: Fay Records, MD;  Location: [facility] ENDOSCOPY;  Service: Cardiovascular;  Laterality: N/A;   CARDIOVERSION N/A 06/01/2022   Procedure: CARDIOVERSION;  Surgeon: [name], MD;  Location: [place];  Service: Cardiovascular;  Laterality: N/A;   COLONOSCOPY  last one 08-07-2016   EYE SURGERY     FRAMELESS  BIOPSY WITH BRAINLAB Left 04/06/2020   Procedure: Left Stereotactic brain biopsy with brainlab;  Surgeon: Judith Part, MD;  Location: [place];  Service: Neurosurgery;  Laterality: Left;   HEMORRHOID BANDING  10-20-2016;  09-11-2016   HERNIA REPAIR     KNEE ARTHROSCOPY Bilateral    10 + yrs ago   LASIK Bilateral    PILONIDAL CYST EXCISION     POLYPECTOMY     THYROIDECTOMY Left 2013   TRANSURETHRAL RESECTION OF PROSTATE N/A 01/04/2017   Procedure: TRANSURETHRAL RESECTION OF THE PROSTATE (TURP);  Surgeon: Franchot Gallo, MD;  Location: [facility];  Service: Urology;  Laterality: N/A;   UPPER GASTROINTESTINAL ENDOSCOPY  10/30/2008   WISDOM TOOTH EXTRACTION      Current Outpatient Medications  Medication Sig Dispense Refill   alendronate (FOSAMAX) 10 MG tablet Take 10 mg by mouth every evening.     azaTHIOprine (IMURAN) 50 MG tablet Take 100 mg by mouth daily.     Carboxymethylcellul-Glycerin (LUBRICATING EYE DROPS OP) Place 1 drop into both eyes daily as needed (dry eyes).     diltiazem (CARDIZEM CD) 120 MG 24 hr capsule Take 1 capsule (120 mg total) by mouth 2 (two) times daily. 180 capsule 1   EPINEPHrine 0.3 mg/0.3 mL IJ SOAJ injection Inject 0.3 mg into the muscle as needed for anaphylaxis.     eszopiclone (LUNESTA) 2 MG TABS tablet Take 2 mg by mouth at bedtime.      famotidine-calcium carbonate-magnesium hydroxide (PEPCID COMPLETE) 10-800-165 MG chewable tablet Chew 1 tablet by mouth at bedtime as needed (acid reflux).     fluticasone (FLONASE) 50 MCG/ACT nasal spray Place 1 spray into both nostrils daily. OTC Flonase     predniSONE (DELTASONE) 10 MG tablet Take 20 mg by mouth daily.     rivaroxaban (XARELTO) 20 MG TABS tablet TAKE 1 TABLET(20 MG) BY MOUTH DAILY WITH SUPPER 90 tablet 1   rosuvastatin (CRESTOR) 5 MG tablet TAKE 1/2 TABLET(2.5 MG) BY MOUTH DAILY 45 tablet 3   Vibegron (GEMTESA) 75 MG TABS Take 75 mg by mouth at bedtime.     No current facility-administered medications for  this encounter.    Allergies  Allergen Reactions   Other     Mints - makes him sneeze   Sulfa Antibiotics Hives and Rash   Ambrosia Artemisiifolia (Ragweed) Skin Test Other (See Comments)    hayfever   Flagyl [Metronidazole] Other (See Comments)    Pt reported he was light headed for 2 weeks.      Social History   Socioeconomic History   Marital status: Married    Spouse name: Not on file   Number of children: 0   Years of education: 16+   Highest education level: Bachelor's degree (e.g., BA, AB, BS)  Occupational History   Occupation: Retired  Tobacco Use   Smoking status: Former   Smokeless tobacco: Never   Tobacco comments:    Former smoker 10/24/21  Vaping Use   Vaping Use: Never used  Substance and Sexual Activity   Alcohol use: Yes    Comment: social    Drug use: No   Sexual activity: Not on file  Other Topics Concern   Not on file  Social History Narrative   Left-handed.   1.5 cups caffeine per day.   Lives at home with his wife.   Social Determinants of Health   Financial Resource Strain: Not on file  Food Insecurity: Not on file  Transportation Needs: Not on file  Physical Activity: Not on file  Stress: Not on file  Social Connections: Not on file  Intimate Partner Violence: Not on file    Family History  Problem Relation  Age of Onset   Heart attack Mother        20's   Other Father        atherosclerosis - 59's   Other Sister        brain tumor - unsure if it was cancer - 78's   Prostate cancer Maternal Grandfather    Colon cancer Neg Hx    Esophageal cancer Neg Hx    Pancreatic cancer Neg Hx    Rectal cancer Neg Hx    Stomach cancer Neg Hx    Colon polyps Neg Hx    Crohn's disease Neg Hx     ROS- All systems are reviewed and negative except as per the HPI above  Physical Exam: Vitals:   06/15/22 1404  Height: '5\' 10"'$  (1.778 m)    Wt Readings from Last 3 Encounters:  06/01/22 104.3 kg  05/25/22 105.7 kg  02/01/22 101.9 kg    Labs: Lab Results  Component Value Date   NA 141 05/25/2022   K 4.5 05/25/2022   CL 108 05/25/2022   CO2 25 05/25/2022   GLUCOSE 119 (H) 05/25/2022   BUN 17 05/25/2022   CREATININE 1.00 05/25/2022   CALCIUM 9.6 05/25/2022   Lab Results  Component Value Date   INR 1.8 (H) 05/24/2021   Lab Results  Component Value Date   CHOL 106 08/22/2019   HDL 38 (L) 08/22/2019   LDLCALC 47 08/22/2019   TRIG 118 08/22/2019    GEN- The patient is a well appearing male, alert and oriented x 3 today.   HEENT-head normocephalic, atraumatic, sclera clear, conjunctiva pink, hearing intact, trachea midline. Lungs- Clear to ausculation bilaterally, normal work of breathing Heart- Regular rate and rhythm, no murmurs, rubs or gallops  GI- soft, NT, ND, + BS Extremities- no clubbing, cyanosis, or edema MS- no significant deformity or atrophy Skin- no rash or lesion Psych- euthymic mood, full affect Neuro- strength and sensation are  intact   EKG- SB, RBBB Vent. rate 52 BPM PR interval 184 ms QRS duration 128 ms QT/QTcB 460/427 ms   CHA2DS2-VASc Score = 3  The patient's score is based upon: CHF History: 0 HTN History: 1 Diabetes History: 0 Stroke History: 0 Vascular Disease History: 1 Age Score: 1 Gender Score: 0       ASSESSMENT AND PLAN: 1. Persistent  Atrial Fibrillation (ICD10:  I48.19) The patient's CHA2DS2-VASc score is 3, indicating a 3.2% annual risk of stroke.   S/p DCCV 06/01/22 Patient appears to be maintaining SR. Patient is not interested in afib ablation at this time. We discussed AAD if he has quick return of his afib.  Continue  diltiazem 120 mg BID  Continue Xarelto 20 mg daily Apple Watch for home monitoring.   2. Secondary Hypercoagulable State (ICD10:  D68.69) The patient is at significant risk for stroke/thromboembolism based upon his CHA2DS2-VASc Score of 3.  Continue Rivaroxaban (Xarelto).   3. HTN Stable, no changes today.  4. CAD CAC score 186 No anginal symptoms.   Follow up with [name] in 6 months. AF clinic in one year.     [facility] [address] [place],  [address] [phone]

## 2022-06-16 ENCOUNTER — Other Ambulatory Visit: Payer: Self-pay

## 2022-06-16 MED ORDER — DILTIAZEM HCL ER COATED BEADS 120 MG PO CP24: 120.0000 mg | ORAL_CAPSULE | Freq: Two times a day (BID) | ORAL | 1 refills | Status: DC

## 2022-06-19 DIAGNOSIS — J301 Allergic rhinitis due to pollen: Secondary | ICD-10-CM | POA: Diagnosis not present

## 2022-06-19 DIAGNOSIS — J3089 Other allergic rhinitis: Secondary | ICD-10-CM | POA: Diagnosis not present

## 2022-06-19 DIAGNOSIS — J3081 Allergic rhinitis due to animal (cat) (dog) hair and dander: Secondary | ICD-10-CM | POA: Diagnosis not present

## 2022-06-27 ENCOUNTER — Ambulatory Visit: Payer: Medicare Other | Admitting: Internal Medicine

## 2022-06-27 DIAGNOSIS — J301 Allergic rhinitis due to pollen: Secondary | ICD-10-CM | POA: Diagnosis not present

## 2022-06-27 DIAGNOSIS — J3081 Allergic rhinitis due to animal (cat) (dog) hair and dander: Secondary | ICD-10-CM | POA: Diagnosis not present

## 2022-06-27 DIAGNOSIS — J3089 Other allergic rhinitis: Secondary | ICD-10-CM | POA: Diagnosis not present

## 2022-06-28 ENCOUNTER — Ambulatory Visit
Admission: RE | Admit: 2022-06-28 | Discharge: 2022-06-28 | Disposition: A | Discharge status: Home / Self Care | Payer: Medicare Other | Source: Ambulatory Visit | Attending: Nephrology | Admitting: Nephrology

## 2022-06-28 DIAGNOSIS — N289 Disorder of kidney and ureter, unspecified: Secondary | ICD-10-CM | POA: Diagnosis not present

## 2022-06-28 DIAGNOSIS — N200 Calculus of kidney: Secondary | ICD-10-CM | POA: Diagnosis not present

## 2022-06-28 DIAGNOSIS — R3129 Other microscopic hematuria: Secondary | ICD-10-CM

## 2022-06-29 ENCOUNTER — Ambulatory Visit: Payer: Medicare Other | Admitting: Internal Medicine

## 2022-07-03 DIAGNOSIS — J3081 Allergic rhinitis due to animal (cat) (dog) hair and dander: Secondary | ICD-10-CM | POA: Diagnosis not present

## 2022-07-03 DIAGNOSIS — J301 Allergic rhinitis due to pollen: Secondary | ICD-10-CM | POA: Diagnosis not present

## 2022-07-03 DIAGNOSIS — J3089 Other allergic rhinitis: Secondary | ICD-10-CM | POA: Diagnosis not present

## 2022-07-04 ENCOUNTER — Ambulatory Visit: Payer: Medicare Other | Admitting: Internal Medicine

## 2022-07-04 ENCOUNTER — Encounter: Payer: Self-pay | Admitting: Internal Medicine

## 2022-07-04 VITALS — BP 126/70 | HR 57 | Temp 97.7°F | Resp 18 | Ht 70.0 in | Wt 233.4 lb

## 2022-07-04 DIAGNOSIS — R051 Acute cough: Secondary | ICD-10-CM | POA: Diagnosis not present

## 2022-07-04 NOTE — Progress Notes (Signed)
   Acute Office Visit  Subjective:     Patient ID: Cory Mathis, male    DOB: 06-29-1950, 72 y.o.   MRN: [id number]  Chief Complaint  Patient presents with   Cough    Cough   Patient is in today for cough for 7-8 days. He says that he has something in his throat and he cough day and night and does not bring any flame out. Today he felt better but he just wanted to be checked. His COVID test is negative. He has nasal spray and that is helping. He takes allergy shots and are better.   Review of Systems  Respiratory:  Positive for cough.   Cardiovascular: Negative.         Objective:    Pulse (!) 57   Temp 97.7 F (36.5 C)   Resp 18   Ht '5\' 7"'$  (1.702 m)   SpO2 97%   BMI 36.65 kg/m    Physical Exam Constitutional:      Appearance: He is obese.  HENT:     Head: Normocephalic and atraumatic.     Mouth/Throat:     Mouth: Mucous membranes are moist.     Pharynx: Oropharynx is clear.  Pulmonary:     Effort: Pulmonary effort is normal.     Breath sounds: Normal breath sounds.     No results found for any visits on 07/04/22.      Assessment & Plan:   Problem List Items Addressed This Visit   None   No orders of the defined types were placed in this encounter.   No follow-ups on file.  Garwin Brothers, MD

## 2022-07-04 NOTE — Assessment & Plan Note (Signed)
As his cough started getting better so will monitor, if cough got worse then he will call.

## 2022-07-06 ENCOUNTER — Other Ambulatory Visit: Payer: Self-pay

## 2022-07-06 DIAGNOSIS — I4819 Other persistent atrial fibrillation: Secondary | ICD-10-CM

## 2022-07-06 MED ORDER — RIVAROXABAN 20 MG PO TABS: ORAL_TABLET | ORAL | 1 refills | Status: DC

## 2022-07-06 NOTE — Telephone Encounter (Signed)
Prescription refill request for Xarelto received.  Indication: Afib  Last office visit:06/15/22 Cory Mathis)  Weight:105.9kg Age: 72 Scr: 1.00 (05/25/22)  CrCl: 100.51m/min  Appropriate dose and refill sent to requested pharmacy.

## 2022-07-10 DIAGNOSIS — N400 Enlarged prostate without lower urinary tract symptoms: Secondary | ICD-10-CM | POA: Diagnosis not present

## 2022-07-10 DIAGNOSIS — I10 Essential (primary) hypertension: Secondary | ICD-10-CM | POA: Diagnosis not present

## 2022-07-10 DIAGNOSIS — G4700 Insomnia, unspecified: Secondary | ICD-10-CM | POA: Diagnosis not present

## 2022-07-10 DIAGNOSIS — E7800 Pure hypercholesterolemia, unspecified: Secondary | ICD-10-CM | POA: Diagnosis not present

## 2022-07-10 DIAGNOSIS — J3089 Other allergic rhinitis: Secondary | ICD-10-CM | POA: Diagnosis not present

## 2022-07-10 DIAGNOSIS — I480 Paroxysmal atrial fibrillation: Secondary | ICD-10-CM | POA: Diagnosis not present

## 2022-07-10 DIAGNOSIS — E039 Hypothyroidism, unspecified: Secondary | ICD-10-CM | POA: Diagnosis not present

## 2022-07-10 DIAGNOSIS — K219 Gastro-esophageal reflux disease without esophagitis: Secondary | ICD-10-CM | POA: Diagnosis not present

## 2022-07-10 DIAGNOSIS — J301 Allergic rhinitis due to pollen: Secondary | ICD-10-CM | POA: Diagnosis not present

## 2022-07-10 DIAGNOSIS — J3081 Allergic rhinitis due to animal (cat) (dog) hair and dander: Secondary | ICD-10-CM | POA: Diagnosis not present

## 2022-07-14 DIAGNOSIS — G939 Disorder of brain, unspecified: Secondary | ICD-10-CM | POA: Diagnosis not present

## 2022-07-14 DIAGNOSIS — Z7960 Long term (current) use of unspecified immunomodulators and immunosuppressants: Secondary | ICD-10-CM | POA: Diagnosis not present

## 2022-07-17 DIAGNOSIS — J301 Allergic rhinitis due to pollen: Secondary | ICD-10-CM | POA: Diagnosis not present

## 2022-07-17 DIAGNOSIS — J3089 Other allergic rhinitis: Secondary | ICD-10-CM | POA: Diagnosis not present

## 2022-07-17 DIAGNOSIS — J3081 Allergic rhinitis due to animal (cat) (dog) hair and dander: Secondary | ICD-10-CM | POA: Diagnosis not present

## 2022-07-20 ENCOUNTER — Encounter (HOSPITAL_COMMUNITY): Payer: Self-pay | Admitting: *Deleted

## 2022-07-20 DIAGNOSIS — I10 Essential (primary) hypertension: Secondary | ICD-10-CM | POA: Diagnosis not present

## 2022-07-20 DIAGNOSIS — R350 Frequency of micturition: Secondary | ICD-10-CM | POA: Diagnosis not present

## 2022-07-20 DIAGNOSIS — R3589 Other polyuria: Secondary | ICD-10-CM | POA: Diagnosis not present

## 2022-07-24 DIAGNOSIS — J301 Allergic rhinitis due to pollen: Secondary | ICD-10-CM | POA: Diagnosis not present

## 2022-07-24 DIAGNOSIS — J3089 Other allergic rhinitis: Secondary | ICD-10-CM | POA: Diagnosis not present

## 2022-07-24 DIAGNOSIS — J3081 Allergic rhinitis due to animal (cat) (dog) hair and dander: Secondary | ICD-10-CM | POA: Diagnosis not present

## 2022-07-31 ENCOUNTER — Encounter: Payer: Self-pay | Admitting: Podiatry

## 2022-07-31 ENCOUNTER — Ambulatory Visit (INDEPENDENT_AMBULATORY_CARE_PROVIDER_SITE_OTHER): Payer: Medicare Other

## 2022-07-31 ENCOUNTER — Ambulatory Visit (INDEPENDENT_AMBULATORY_CARE_PROVIDER_SITE_OTHER): Payer: Medicare Other | Admitting: Podiatry

## 2022-07-31 DIAGNOSIS — M7661 Achilles tendinitis, right leg: Secondary | ICD-10-CM

## 2022-07-31 NOTE — Progress Notes (Signed)
  Subjective:  Patient ID: Cory Mathis, male    DOB: Feb 22, 1951,   MRN: [id number]  Chief Complaint  Patient presents with   Foot Pain    Possible AchillesTendonitis  pain     72 y.o. male presents for concern of right heel pain that has been going on for w while. Relates feeling a tweek of pain initially and has progressively been more painful especially when walking on it. Denies any current treatments.    . Denies any other pedal complaints. Denies n/v/f/c.   Past Medical History:  Diagnosis Date   Allergic rhinitis    Allergy    Atrial fibrillation with rapid ventricular response (HCC)    Benign neoplasm of right choroid    BPH (benign prostatic hyperplasia)    Brain tumor (benign) (HCC)    Cataract    bilateral,removed   Chest pain    Chronic headaches    Constipation    Diverticulosis of colon    Dysplastic nevus    Dysrhythmia 2019   Afib   Fatigue    Gastroesophageal reflux disease without esophagitis    GERD (gastroesophageal reflux disease)    prn med. 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   Hypertension    Hypothyroidism    Insomnia    Internal hemorrhoids    Left leg weakness    Memory loss    Nocturia    OSA on CPAP    CPAP nightly   Paresthesia of both feet    Retinal pigmentation    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[place])    Right thyroid nodule    Sinus bradycardia    Sleep apnea    c pap   Urinary retention 12/13/2016    Objective:  Physical Exam: Vascular: DP/PT pulses 2/4 bilateral. CFT <3 seconds. Normal hair growth on  digits. No edema.  Skin. No lacerations or abrasions bilateral feet.  Musculoskeletal: MMT 5/5 bilateral lower extremities in DF, PF, Inversion and Eversion. Deceased ROM in DF of ankle joint.  Tender to watershed area of achilles tendon. No pain near insertion. No pain with calf squeeze. No pain with DF or PF of the ankle. No pain plantarly or around PT tendon.  Neurological: Sensation intact to light touch.   Assessment:   1. Achilles tendinitis, right leg      Plan:  Patient was evaluated and treated and all questions answered. -Xrays reviewed. No acute fractures or dislocations. Mild haglunds deformity noted. No spurring -Discussed Achilles insertional tendonitis and treatment options with patient.  -Discussed stretching exercises. -Will take tyelnol 500 mg twice a day for next two weeks.  -Heel lifts provided and discussed proper shoewear.  -Discussed if no improvement will consider MRI/PT/EPAT/PRP injections.  -Patient to return to office in 6 weeks for recheck.    Lorenda Peck, DPM

## 2022-07-31 NOTE — Patient Instructions (Signed)

## 2022-08-01 DIAGNOSIS — E039 Hypothyroidism, unspecified: Secondary | ICD-10-CM | POA: Diagnosis not present

## 2022-08-01 DIAGNOSIS — N400 Enlarged prostate without lower urinary tract symptoms: Secondary | ICD-10-CM | POA: Diagnosis not present

## 2022-08-01 DIAGNOSIS — I480 Paroxysmal atrial fibrillation: Secondary | ICD-10-CM | POA: Diagnosis not present

## 2022-08-01 DIAGNOSIS — I10 Essential (primary) hypertension: Secondary | ICD-10-CM | POA: Diagnosis not present

## 2022-08-01 DIAGNOSIS — E7800 Pure hypercholesterolemia, unspecified: Secondary | ICD-10-CM | POA: Diagnosis not present

## 2022-08-01 DIAGNOSIS — G4700 Insomnia, unspecified: Secondary | ICD-10-CM | POA: Diagnosis not present

## 2022-08-01 DIAGNOSIS — K219 Gastro-esophageal reflux disease without esophagitis: Secondary | ICD-10-CM | POA: Diagnosis not present

## 2022-08-03 DIAGNOSIS — Z79899 Other long term (current) drug therapy: Secondary | ICD-10-CM | POA: Diagnosis not present

## 2022-08-03 DIAGNOSIS — I776 Arteritis, unspecified: Secondary | ICD-10-CM | POA: Diagnosis not present

## 2022-08-07 ENCOUNTER — Encounter: Payer: Self-pay | Admitting: Nephrology

## 2022-08-07 DIAGNOSIS — G939 Disorder of brain, unspecified: Secondary | ICD-10-CM | POA: Diagnosis not present

## 2022-08-07 DIAGNOSIS — Z7952 Long term (current) use of systemic steroids: Secondary | ICD-10-CM | POA: Diagnosis not present

## 2022-08-07 DIAGNOSIS — J301 Allergic rhinitis due to pollen: Secondary | ICD-10-CM | POA: Diagnosis not present

## 2022-08-07 DIAGNOSIS — J3089 Other allergic rhinitis: Secondary | ICD-10-CM | POA: Diagnosis not present

## 2022-08-07 DIAGNOSIS — I776 Arteritis, unspecified: Secondary | ICD-10-CM | POA: Diagnosis not present

## 2022-08-07 DIAGNOSIS — J3081 Allergic rhinitis due to animal (cat) (dog) hair and dander: Secondary | ICD-10-CM | POA: Diagnosis not present

## 2022-08-07 DIAGNOSIS — D6869 Other thrombophilia: Secondary | ICD-10-CM | POA: Diagnosis not present

## 2022-08-07 DIAGNOSIS — I4891 Unspecified atrial fibrillation: Secondary | ICD-10-CM | POA: Diagnosis not present

## 2022-08-07 DIAGNOSIS — Z6834 Body mass index (BMI) 34.0-34.9, adult: Secondary | ICD-10-CM | POA: Diagnosis not present

## 2022-08-07 DIAGNOSIS — I69351 Hemiplegia and hemiparesis following cerebral infarction affecting right dominant side: Secondary | ICD-10-CM | POA: Diagnosis not present

## 2022-08-07 DIAGNOSIS — M818 Other osteoporosis without current pathological fracture: Secondary | ICD-10-CM | POA: Diagnosis not present

## 2022-08-10 DIAGNOSIS — M7661 Achilles tendinitis, right leg: Secondary | ICD-10-CM | POA: Diagnosis not present

## 2022-08-17 ENCOUNTER — Telehealth: Payer: Self-pay | Admitting: Pharmacy Technician

## 2022-08-17 ENCOUNTER — Other Ambulatory Visit: Payer: Self-pay

## 2022-08-17 DIAGNOSIS — M7661 Achilles tendinitis, right leg: Secondary | ICD-10-CM | POA: Diagnosis not present

## 2022-08-17 DIAGNOSIS — M6281 Muscle weakness (generalized): Secondary | ICD-10-CM | POA: Diagnosis not present

## 2022-08-17 DIAGNOSIS — M810 Age-related osteoporosis without current pathological fracture: Secondary | ICD-10-CM | POA: Insufficient documentation

## 2022-08-17 DIAGNOSIS — I776 Arteritis, unspecified: Secondary | ICD-10-CM | POA: Diagnosis not present

## 2022-08-17 NOTE — Telephone Encounter (Signed)
Auth Submission: NO AUTH NEEDED Payer: MEDICARE Medication & CPT/J Code(s) submitted: Reclast (Zolendronic acid) [id number] Route of submission (phone, fax, portal):  Phone # Fax # Auth type: Buy/Bill Units/visits requested: X1 Reference number:  Approval from: 08/15/22 to 08/15/23

## 2022-08-18 DIAGNOSIS — H6123 Impacted cerumen, bilateral: Secondary | ICD-10-CM | POA: Diagnosis not present

## 2022-08-18 DIAGNOSIS — M6281 Muscle weakness (generalized): Secondary | ICD-10-CM | POA: Diagnosis not present

## 2022-08-18 DIAGNOSIS — E042 Nontoxic multinodular goiter: Secondary | ICD-10-CM | POA: Diagnosis not present

## 2022-08-18 DIAGNOSIS — M7661 Achilles tendinitis, right leg: Secondary | ICD-10-CM | POA: Diagnosis not present

## 2022-08-22 DIAGNOSIS — M6281 Muscle weakness (generalized): Secondary | ICD-10-CM | POA: Diagnosis not present

## 2022-08-22 DIAGNOSIS — M7661 Achilles tendinitis, right leg: Secondary | ICD-10-CM | POA: Diagnosis not present

## 2022-08-24 ENCOUNTER — Ambulatory Visit (INDEPENDENT_AMBULATORY_CARE_PROVIDER_SITE_OTHER): Payer: Medicare Other

## 2022-08-24 VITALS — BP 137/72 | HR 54 | Temp 98.7°F | Resp 20 | Ht 70.0 in | Wt 236.6 lb

## 2022-08-24 DIAGNOSIS — M810 Age-related osteoporosis without current pathological fracture: Secondary | ICD-10-CM | POA: Diagnosis not present

## 2022-08-24 MED ORDER — DIPHENHYDRAMINE HCL 25 MG PO CAPS
25.0000 mg | ORAL_CAPSULE | Freq: Once | ORAL | Status: AC
  Administered 2022-08-24: 25 mg via ORAL
  Filled 2022-08-24: qty 1

## 2022-08-24 MED ORDER — ZOLEDRONIC ACID 5 MG/100ML IV SOLN
5.0000 mg | Freq: Once | INTRAVENOUS | Status: AC
  Administered 2022-08-24: 5 mg via INTRAVENOUS
  Filled 2022-08-24: qty 100

## 2022-08-24 MED ORDER — ACETAMINOPHEN 325 MG PO TABS
650.0000 mg | ORAL_TABLET | Freq: Once | ORAL | Status: AC
  Administered 2022-08-24: 650 mg via ORAL
  Filled 2022-08-24: qty 2

## 2022-08-24 MED ORDER — SODIUM CHLORIDE 0.9 % IV SOLN: INTRAVENOUS | Status: DC

## 2022-08-24 NOTE — Progress Notes (Signed)
Diagnosis: Osteoporosis  Provider:  Marshell Garfinkel MD  Procedure: Infusion  IV Type: Peripheral, IV Location: L Forearm  Reclast (Zolendronic Acid), Dose: 5 mg  Infusion Start Time: [id number]  Infusion Stop Time: 1518  Post Infusion IV Care: Peripheral IV Discontinued  Discharge: Condition: Good, Destination: Home . AVS Provided  Performed by:  Cleophus Molt, RN

## 2022-08-24 NOTE — Patient Instructions (Signed)

## 2022-08-25 ENCOUNTER — Emergency Department (HOSPITAL_BASED_OUTPATIENT_CLINIC_OR_DEPARTMENT_OTHER)
Admission: EM | Admit: 2022-08-25 | Discharge: 2022-08-25 | Disposition: A | Discharge status: Home / Self Care | Payer: Medicare Other | Attending: Emergency Medicine | Admitting: Emergency Medicine

## 2022-08-25 ENCOUNTER — Emergency Department (HOSPITAL_BASED_OUTPATIENT_CLINIC_OR_DEPARTMENT_OTHER): Payer: Medicare Other

## 2022-08-25 ENCOUNTER — Telehealth: Payer: Self-pay

## 2022-08-25 ENCOUNTER — Telehealth: Payer: Self-pay | Admitting: *Deleted

## 2022-08-25 ENCOUNTER — Other Ambulatory Visit (HOSPITAL_BASED_OUTPATIENT_CLINIC_OR_DEPARTMENT_OTHER): Payer: Self-pay

## 2022-08-25 ENCOUNTER — Encounter: Payer: Self-pay | Admitting: Pulmonary Disease

## 2022-08-25 ENCOUNTER — Other Ambulatory Visit: Payer: Self-pay

## 2022-08-25 DIAGNOSIS — Z7901 Long term (current) use of anticoagulants: Secondary | ICD-10-CM | POA: Diagnosis not present

## 2022-08-25 DIAGNOSIS — Z20822 Contact with and (suspected) exposure to covid-19: Secondary | ICD-10-CM | POA: Insufficient documentation

## 2022-08-25 DIAGNOSIS — R509 Fever, unspecified: Secondary | ICD-10-CM | POA: Diagnosis not present

## 2022-08-25 DIAGNOSIS — R531 Weakness: Secondary | ICD-10-CM | POA: Diagnosis not present

## 2022-08-25 LAB — CBC WITH DIFFERENTIAL/PLATELET
Abs Immature Granulocytes: 0.12 10*3/uL — ABNORMAL HIGH (ref 0.00–0.07)
Basophils Absolute: 0 10*3/uL (ref 0.0–0.1)
Basophils Relative: 0 %
Eosinophils Absolute: 0 10*3/uL (ref 0.0–0.5)
Eosinophils Relative: 0 %
HCT: 40.5 % (ref 39.0–52.0)
Hemoglobin: 14.3 g/dL (ref 13.0–17.0)
Immature Granulocytes: 1 %
Lymphocytes Relative: 2 %
Lymphs Abs: 0.3 10*3/uL — ABNORMAL LOW (ref 0.7–4.0)
MCH: 35 pg — ABNORMAL HIGH (ref 26.0–34.0)
MCHC: 35.3 g/dL (ref 30.0–36.0)
MCV: 99.3 fL (ref 80.0–100.0)
Monocytes Absolute: 0.5 10*3/uL (ref 0.1–1.0)
Monocytes Relative: 4 %
Neutro Abs: 12.8 10*3/uL — ABNORMAL HIGH (ref 1.7–7.7)
Neutrophils Relative %: 93 %
Platelets: 265 10*3/uL (ref 150–400)
RBC: 4.08 MIL/uL — ABNORMAL LOW (ref 4.22–5.81)
RDW: 13.9 % (ref 11.5–15.5)
WBC: 13.7 10*3/uL — ABNORMAL HIGH (ref 4.0–10.5)
nRBC: 0 % (ref 0.0–0.2)

## 2022-08-25 LAB — URINALYSIS, ROUTINE W REFLEX MICROSCOPIC
Bacteria, UA: NONE SEEN
Bilirubin Urine: NEGATIVE
Glucose, UA: NEGATIVE mg/dL
Ketones, ur: 15 mg/dL — AB
Leukocytes,Ua: NEGATIVE
Nitrite: NEGATIVE
Protein, ur: NEGATIVE mg/dL
Specific Gravity, Urine: 1.021 (ref 1.005–1.030)
pH: 5.5 (ref 5.0–8.0)

## 2022-08-25 LAB — RESP PANEL BY RT-PCR (RSV, FLU A&B, COVID)  RVPGX2
Influenza A by PCR: NEGATIVE
Influenza B by PCR: NEGATIVE
Resp Syncytial Virus by PCR: NEGATIVE
SARS Coronavirus 2 by RT PCR: NEGATIVE

## 2022-08-25 LAB — COMPREHENSIVE METABOLIC PANEL
ALT: 86 U/L — ABNORMAL HIGH (ref 0–44)
AST: 41 U/L (ref 15–41)
Albumin: 4 g/dL (ref 3.5–5.0)
Alkaline Phosphatase: 22 U/L — ABNORMAL LOW (ref 38–126)
Anion gap: 9 (ref 5–15)
BUN: 17 mg/dL (ref 8–23)
CO2: 23 mmol/L (ref 22–32)
Calcium: 9.2 mg/dL (ref 8.9–10.3)
Chloride: 104 mmol/L (ref 98–111)
Creatinine, Ser: 0.96 mg/dL (ref 0.61–1.24)
GFR, Estimated: >60 mL/min (ref >60)
Glucose, Bld: 110 mg/dL — ABNORMAL HIGH (ref 70–99)
Potassium: 3.8 mmol/L (ref 3.5–5.1)
Sodium: 136 mmol/L (ref 135–145)
Total Bilirubin: 1.9 mg/dL — ABNORMAL HIGH (ref 0.3–1.2)
Total Protein: 6.3 g/dL — ABNORMAL LOW (ref 6.5–8.1)

## 2022-08-25 LAB — LACTIC ACID, PLASMA: Lactic Acid, Venous: 0.9 mmol/L (ref 0.5–1.9)

## 2022-08-25 MED ORDER — SODIUM CHLORIDE 0.9 % IV BOLUS
1000.0000 mL | Freq: Once | INTRAVENOUS | Status: AC
  Administered 2022-08-25: 1000 mL via INTRAVENOUS

## 2022-08-25 MED ORDER — ACETAMINOPHEN 500 MG PO TABS
1000.0000 mg | ORAL_TABLET | Freq: Once | ORAL | Status: AC
  Administered 2022-08-25: 1000 mg via ORAL
  Filled 2022-08-25: qty 2

## 2022-08-25 NOTE — ED Provider Notes (Signed)
[facility] Provider Note   CSN: [id number] Arrival date & time: 08/25/22  1048     History  Chief Complaint  Patient presents with   Fever    Cory Mathis is a 72 y.o. male.  72 yo M with a chief complaint of a fever.  This was noticed this morning.  Was 102.  Yesterday was fine.  He was also very weak this morning when this occurred.  He has no obvious infectious symptoms.  He denies cough congestion or fever denies abdominal pain denies rash.  He did have a infusion yesterday of Reclast.  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   Fever      Home Medications Prior to Admission medications   Medication Sig Start Date End Date Taking? Authorizing Provider  alendronate (FOSAMAX) 10 MG tablet Take 10 mg by mouth every evening. 03/16/22   [provider]  azaTHIOprine (IMURAN) 50 MG tablet Take 100 mg by mouth daily. 05/22/22 05/22/23  [provider]  Carboxymethylcellul-Glycerin (LUBRICATING EYE DROPS OP) Place 1 drop into both eyes daily as needed (dry eyes).    [provider]  diltiazem (CARDIZEM CD) 120 MG 24 hr capsule Take 1 capsule (120 mg total) by mouth 2 (two) times daily. 06/16/22   Fay Records, MD  EPINEPHrine 0.3 mg/0.3 mL IJ SOAJ injection Inject 0.3 mg into the muscle as needed for anaphylaxis. 08/11/20   [provider]  eszopiclone (LUNESTA) 2 MG TABS tablet Take 2 mg by mouth at bedtime. 05/26/20   [provider]  famotidine-calcium carbonate-magnesium hydroxide (PEPCID COMPLETE) 10-800-165 MG chewable tablet Chew 1 tablet by mouth at bedtime as needed (acid reflux).    [provider]  fluticasone (FLONASE) 50 MCG/ACT nasal spray Place 1 spray into both nostrils daily. OTC Flonase    [provider]  predniSONE (DELTASONE) 10 MG tablet Take 20 mg by mouth daily.    [provider]  rivaroxaban (XARELTO) 20 MG TABS tablet TAKE 1  TABLET(20 MG) BY MOUTH DAILY WITH SUPPER 07/06/22   Fay Records, MD  rosuvastatin (CRESTOR) 5 MG tablet TAKE 1/2 TABLET(2.5 MG) BY MOUTH DAILY 03/31/22   Fay Records, MD  Vibegron (GEMTESA) 75 MG TABS Take 75 mg by mouth at bedtime. 03/02/22   [provider]      Allergies    Other, Sulfa antibiotics, Ambrosia artemisiifolia (ragweed) skin test, and Flagyl [metronidazole]    Review of Systems   Review of Systems  Constitutional:  Positive for fever.    Physical Exam Updated Vital Signs BP 129/60   Pulse 66   Temp (!) 100.9 F (38.3 C) (Axillary)   Resp (!) 22   Ht 5\' 10"  (1.778 m)   Wt 106.6 kg   SpO2 93%   BMI 33.72 kg/m  Physical Exam Vitals and nursing note reviewed.  Constitutional:      Appearance: He is well-developed.  HENT:     Head: Normocephalic and atraumatic.  Eyes:     Pupils: Pupils are equal, round, and reactive to light.  Neck:     Vascular: No JVD.  Cardiovascular:     Rate and Rhythm: Normal rate and regular rhythm.     Heart sounds: No murmur heard.    No friction rub. No gallop.  Pulmonary:     Effort: No respiratory distress.     Breath sounds: No wheezing.  Abdominal:     General: There  is no distension.     Tenderness: There is no abdominal tenderness. There is no guarding or rebound.  Musculoskeletal:        General: Normal range of motion.     Cervical back: Normal range of motion and neck supple.  Skin:    Coloration: Skin is not pale.     Findings: No rash.  Neurological:     Mental Status: He is alert and oriented to person, place, and time.  Psychiatric:        Behavior: Behavior normal.     ED Results / Procedures / Treatments   Labs (all labs ordered are listed, but only abnormal results are displayed) Labs Reviewed  COMPREHENSIVE METABOLIC PANEL - Abnormal; Notable for the following components:      Result Value   Glucose, Bld 110 (*)    Total Protein 6.3 (*)    ALT 86 (*)    Alkaline Phosphatase 22 (*)     Total Bilirubin 1.9 (*)    All other components within normal limits  CBC WITH DIFFERENTIAL/PLATELET - Abnormal; Notable for the following components:   WBC 13.7 (*)    RBC 4.08 (*)    MCH 35.0 (*)    Neutro Abs 12.8 (*)    Lymphs Abs 0.3 (*)    Abs Immature Granulocytes 0.12 (*)    All other components within normal limits  URINALYSIS, ROUTINE W REFLEX MICROSCOPIC - Abnormal; Notable for the following components:   Hgb urine dipstick MODERATE (*)    Ketones, ur 15 (*)    All other components within normal limits  RESP PANEL BY RT-PCR (RSV, FLU A&B, COVID)  [id number]  CULTURE, BLOOD (ROUTINE X 2)  CULTURE, BLOOD (ROUTINE X 2)  LACTIC ACID, PLASMA  LACTIC ACID, PLASMA    EKG EKG Interpretation  Date/Time:  Friday August 25 2022 11:02:17 EDT Ventricular Rate:  70 PR Interval:  167 QRS Duration: 132 QT Interval:  395 QTC Calculation: 427 R Axis:   79 Text Interpretation: Sinus rhythm Right bundle branch block Baseline wander in lead(s) V1 No significant change since last tracing Confirmed by Deno Etienne [phone]) on 08/25/2022 11:17:16 AM  Radiology DG Chest Port 1 View  Result Date: 08/25/2022 CLINICAL DATA:  Weakness and fever. EXAM: PORTABLE CHEST 1 VIEW COMPARISON:  06/01/2021 FINDINGS: Heart size and mediastinal contours are unremarkable. No pleural effusion or edema. No airspace opacities. Visualized osseous structures are unremarkable. IMPRESSION: No active disease. Electronically Signed   By: Kerby Moors M.D.   On: 08/25/2022 11:38    Procedures Procedures    Medications Ordered in ED Medications  sodium chloride 0.9 % bolus 1,000 mL (1,000 mLs Intravenous New Bag/Given 08/25/22 1135)  acetaminophen (TYLENOL) tablet 1,000 mg (1,000 mg Oral Given 08/25/22 1144)    ED Course/ Medical Decision Making/ A&P                             Medical Decision Making Amount and/or Complexity of Data Reviewed Labs: ordered. Radiology: ordered. ECG/medicine tests:  ordered.  Risk OTC drugs.   72 yo M with a chief complaint of a fever.  This started acutely this morning.  Was asymptomatic yesterday.  Just had a Reclast infusion yesterday.  Had a rapid COVID test reportedly at his nursing facility that was negative.  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  He had  been through chemotherapy and now is on CellCept and MMF.  Has a history of osteoporosis is why he had his infusion yesterday.  On a drug database search request does appear to have a not insignificant chance of inducing a fever.  Perhaps the cause of his symptoms.  He is well-appearing nontoxic.  Will give a bolus of IV fluids.  As he is immunosuppressed I will screen for possible infection.  Chest x-ray UA.  Blood work.  Reassess.  Chest x-ray independently interpreted by me without focal infiltrate or pneumothorax.  UA independently interpreted by me without infection.  Lab work without acute anemia, no significant electrolyte abnormality.  I discussed results with patient and family.  Patient is feeling better after IV fluids.  Will discharge home.  PCP follow-up.  1:13 PM:  I have discussed the diagnosis/risks/treatment options with the patient and family.  Evaluation and diagnostic testing in the emergency department does not suggest an emergent condition requiring admission or immediate intervention beyond what has been performed at this time.  They will follow up with PCP. We also discussed returning to the ED immediately if new or worsening sx occur. We discussed the sx which are most concerning (e.g., sudden worsening pain, fever, inability to tolerate by mouth, cough, abd pain, urinary symptoms) that necessitate immediate return. Medications administered to the patient during their visit and any new prescriptions provided to the patient are listed below.  Medications given during this visit Medications  sodium chloride 0.9 %  bolus 1,000 mL (1,000 mLs Intravenous New Bag/Given 08/25/22 1135)  acetaminophen (TYLENOL) tablet 1,000 mg (1,000 mg Oral Given 08/25/22 1144)     The patient appears reasonably screen and/or stabilized for discharge and I doubt any other medical condition or other [facility] requiring further screening, evaluation, or treatment in the ED at this time prior to discharge.          Final Clinical Impression(s) / ED Diagnoses Final diagnoses:  Fever in adult    Rx / DC Orders ED Discharge Orders     None         Deno Etienne, DO 08/25/22 1313

## 2022-08-25 NOTE — ED Notes (Signed)
Discharge instructions, medications and follow up care reviewed and explained. Pt verbalized understanding and had no further questions.   

## 2022-08-25 NOTE — ED Triage Notes (Signed)
Pt arrived caox4, POV with family from [facility] facility. Pt c/o weakness and fever that started this morning. Pt's wife further reports pt had reclast infusion done for the first time yesterday and expressed concern that fever may be d/t infusion. Pt denies SOB, N/V/D, pain.

## 2022-08-25 NOTE — Discharge Instructions (Signed)
There was no obvious laboratory or imaging cause of your fever.  It could be related to your infusion that you got yesterday.  Please keep an eye on your symptoms.  If you develop chest pain difficulty breathing cough abdominal pain pain when you pee then let someone know so he can be reevaluated.

## 2022-08-28 ENCOUNTER — Other Ambulatory Visit: Payer: Self-pay | Admitting: Otolaryngology

## 2022-08-28 DIAGNOSIS — E042 Nontoxic multinodular goiter: Secondary | ICD-10-CM

## 2022-08-28 DIAGNOSIS — J301 Allergic rhinitis due to pollen: Secondary | ICD-10-CM | POA: Diagnosis not present

## 2022-08-28 DIAGNOSIS — J3081 Allergic rhinitis due to animal (cat) (dog) hair and dander: Secondary | ICD-10-CM | POA: Diagnosis not present

## 2022-08-28 DIAGNOSIS — J3089 Other allergic rhinitis: Secondary | ICD-10-CM | POA: Diagnosis not present

## 2022-08-29 DIAGNOSIS — M7661 Achilles tendinitis, right leg: Secondary | ICD-10-CM | POA: Diagnosis not present

## 2022-08-29 DIAGNOSIS — M6281 Muscle weakness (generalized): Secondary | ICD-10-CM | POA: Diagnosis not present

## 2022-08-30 ENCOUNTER — Encounter: Payer: Self-pay | Admitting: Pulmonary Disease

## 2022-08-30 LAB — CULTURE, BLOOD (ROUTINE X 2)
Culture: NO GROWTH
Culture: NO GROWTH
Special Requests: ADEQUATE
Special Requests: ADEQUATE

## 2022-08-30 NOTE — Telephone Encounter (Signed)
completed

## 2022-08-30 NOTE — Telephone Encounter (Signed)
Advise only

## 2022-09-08 DIAGNOSIS — M7661 Achilles tendinitis, right leg: Secondary | ICD-10-CM | POA: Diagnosis not present

## 2022-09-08 DIAGNOSIS — Z7960 Long term (current) use of unspecified immunomodulators and immunosuppressants: Secondary | ICD-10-CM | POA: Diagnosis not present

## 2022-09-08 DIAGNOSIS — M6281 Muscle weakness (generalized): Secondary | ICD-10-CM | POA: Diagnosis not present

## 2022-09-11 ENCOUNTER — Ambulatory Visit (INDEPENDENT_AMBULATORY_CARE_PROVIDER_SITE_OTHER): Payer: Medicare Other | Admitting: Podiatry

## 2022-09-11 DIAGNOSIS — M6281 Muscle weakness (generalized): Secondary | ICD-10-CM | POA: Diagnosis not present

## 2022-09-11 DIAGNOSIS — M7661 Achilles tendinitis, right leg: Secondary | ICD-10-CM

## 2022-09-12 ENCOUNTER — Encounter: Payer: Self-pay | Admitting: Podiatry

## 2022-09-12 NOTE — Progress Notes (Signed)
  Subjective:  Patient ID: Cory Mathis, male    DOB: 1950-07-11,   MRN: [id number]  Chief Complaint  Patient presents with   Tendonitis    6 wk follow up on right achilles tendonitis    72 y.o. male presents for follow-up of right achilles tendonitsi. Relates he did get better a few days ago but then the pain returned and relates he is not near 50% better. States he did start PT two weeks ago. Has been wearing a heel lift which does help. Denies any current treatments.    . Denies any other pedal complaints. Denies n/v/f/c.   Past Medical History:  Diagnosis Date   Allergic rhinitis    Allergy    Atrial fibrillation with rapid ventricular response    Benign neoplasm of right choroid    BPH (benign prostatic hyperplasia)    Brain tumor (benign)    Cataract    bilateral,removed   Chest pain    Chronic headaches    Constipation    Diverticulosis of colon    Dysplastic nevus    Dysrhythmia 2019   Afib   Fatigue    Gastroesophageal reflux disease without esophagitis    GERD (gastroesophageal reflux disease)    prn med. 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 CFT <3 seconds. Normal hair growth on digits. No edema.  Skin. No lacerations or abrasions bilateral feet.  Musculoskeletal: MMT 5/5 bilateral lower extremities in DF, PF, Inversion and Eversion. Deceased ROM in DF of ankle joint.  Tender to watershed area of achilles tendon. No pain near insertion. No pain with calf squeeze. No pain with DF or PF of the ankle. No pain plantarly or around PT tendon.  Neurological: Sensation intact to light touch.   Assessment:   1. Achilles tendinitis, right leg       Plan:  Patient was evaluated and treated and all questions answered. -Xrays reviewed. No acute fractures or dislocations. Mild haglunds deformity noted. No spurring -Discussed Achilles insertional tendonitis and treatment options with patient.  -Cotninue heel lifts and anti-inflammatories.  -Continue PT and will evaluate after to see if still struggling.  -Discussed if no improvement will consider MRI/PT/EPAT/PRP injections.  -Patient to return to officeas needed after PT.    Lorenda Peck, DPM

## 2022-09-14 ENCOUNTER — Other Ambulatory Visit: Payer: Self-pay

## 2022-09-14 DIAGNOSIS — J3089 Other allergic rhinitis: Secondary | ICD-10-CM | POA: Diagnosis not present

## 2022-09-14 DIAGNOSIS — J3081 Allergic rhinitis due to animal (cat) (dog) hair and dander: Secondary | ICD-10-CM | POA: Diagnosis not present

## 2022-09-14 DIAGNOSIS — J301 Allergic rhinitis due to pollen: Secondary | ICD-10-CM | POA: Diagnosis not present

## 2022-09-14 MED ORDER — ROSUVASTATIN CALCIUM 5 MG PO TABS: ORAL_TABLET | ORAL | 3 refills | Status: DC

## 2022-09-15 DIAGNOSIS — M7661 Achilles tendinitis, right leg: Secondary | ICD-10-CM | POA: Diagnosis not present

## 2022-09-15 DIAGNOSIS — M6281 Muscle weakness (generalized): Secondary | ICD-10-CM | POA: Diagnosis not present

## 2022-09-19 ENCOUNTER — Ambulatory Visit
Admission: RE | Admit: 2022-09-19 | Discharge: 2022-09-19 | Disposition: A | Discharge status: Home / Self Care | Payer: Medicare Other | Source: Ambulatory Visit | Attending: Otolaryngology | Admitting: Otolaryngology

## 2022-09-19 DIAGNOSIS — E890 Postprocedural hypothyroidism: Secondary | ICD-10-CM | POA: Diagnosis not present

## 2022-09-19 DIAGNOSIS — E042 Nontoxic multinodular goiter: Secondary | ICD-10-CM

## 2022-09-20 ENCOUNTER — Other Ambulatory Visit: Payer: Medicare Other

## 2022-09-22 DIAGNOSIS — M7661 Achilles tendinitis, right leg: Secondary | ICD-10-CM | POA: Diagnosis not present

## 2022-09-22 DIAGNOSIS — M6281 Muscle weakness (generalized): Secondary | ICD-10-CM | POA: Diagnosis not present

## 2022-09-25 DIAGNOSIS — M6281 Muscle weakness (generalized): Secondary | ICD-10-CM | POA: Diagnosis not present

## 2022-09-25 DIAGNOSIS — M7661 Achilles tendinitis, right leg: Secondary | ICD-10-CM | POA: Diagnosis not present

## 2022-09-26 DIAGNOSIS — L820 Inflamed seborrheic keratosis: Secondary | ICD-10-CM | POA: Diagnosis not present

## 2022-09-26 DIAGNOSIS — L570 Actinic keratosis: Secondary | ICD-10-CM | POA: Diagnosis not present

## 2022-09-27 DIAGNOSIS — M6281 Muscle weakness (generalized): Secondary | ICD-10-CM | POA: Diagnosis not present

## 2022-09-27 DIAGNOSIS — M7661 Achilles tendinitis, right leg: Secondary | ICD-10-CM | POA: Diagnosis not present

## 2022-09-28 DIAGNOSIS — J3081 Allergic rhinitis due to animal (cat) (dog) hair and dander: Secondary | ICD-10-CM | POA: Diagnosis not present

## 2022-09-28 DIAGNOSIS — J301 Allergic rhinitis due to pollen: Secondary | ICD-10-CM | POA: Diagnosis not present

## 2022-09-28 DIAGNOSIS — J3089 Other allergic rhinitis: Secondary | ICD-10-CM | POA: Diagnosis not present

## 2022-09-29 DIAGNOSIS — M6281 Muscle weakness (generalized): Secondary | ICD-10-CM | POA: Diagnosis not present

## 2022-09-29 DIAGNOSIS — M7661 Achilles tendinitis, right leg: Secondary | ICD-10-CM | POA: Diagnosis not present

## 2022-10-01 ENCOUNTER — Other Ambulatory Visit: Payer: Self-pay | Admitting: Internal Medicine

## 2022-10-01 DIAGNOSIS — I4819 Other persistent atrial fibrillation: Secondary | ICD-10-CM

## 2022-10-02 DIAGNOSIS — M7661 Achilles tendinitis, right leg: Secondary | ICD-10-CM | POA: Diagnosis not present

## 2022-10-02 DIAGNOSIS — M6281 Muscle weakness (generalized): Secondary | ICD-10-CM | POA: Diagnosis not present

## 2022-10-02 NOTE — Telephone Encounter (Signed)
Prescription refill request for Xarelto received.  Indication: a fib Last office visit: 06/15/22 Weight: 236# Age: 72 Scr: 0.96 epic 08/25/22 CrCl: 104 ml/min

## 2022-10-04 DIAGNOSIS — G4700 Insomnia, unspecified: Secondary | ICD-10-CM | POA: Diagnosis not present

## 2022-10-04 DIAGNOSIS — G4733 Obstructive sleep apnea (adult) (pediatric): Secondary | ICD-10-CM | POA: Diagnosis not present

## 2022-10-06 DIAGNOSIS — M7661 Achilles tendinitis, right leg: Secondary | ICD-10-CM | POA: Diagnosis not present

## 2022-10-06 DIAGNOSIS — M6281 Muscle weakness (generalized): Secondary | ICD-10-CM | POA: Diagnosis not present

## 2022-10-09 ENCOUNTER — Ambulatory Visit (INDEPENDENT_AMBULATORY_CARE_PROVIDER_SITE_OTHER): Payer: Medicare Other | Admitting: Podiatry

## 2022-10-09 DIAGNOSIS — M7661 Achilles tendinitis, right leg: Secondary | ICD-10-CM | POA: Diagnosis not present

## 2022-10-09 DIAGNOSIS — M6281 Muscle weakness (generalized): Secondary | ICD-10-CM | POA: Diagnosis not present

## 2022-10-09 NOTE — Progress Notes (Signed)
  Subjective:  Patient ID: Cory Mathis, male    DOB: 03-May-1951,   MRN: [id number]  Chief Complaint  Patient presents with   Foot Pain    Right foot achilles tendonitis flaring up. Patient states that sometimes its an 8 when he walks on it and sometimes its like a 4 when he walks. He hasn't really had too much release of pain from his initial visit on September 11, 2022. Patient states the pain is still right above his ankle.     72 y.o. male presents for follow-up of right achilles tendonitis. Relates still struggling and PT not helping as above  . Denies any other pedal complaints. Denies n/v/f/c.   Past Medical History:  Diagnosis Date   Allergic rhinitis    Allergy    Atrial fibrillation with rapid ventricular response (HCC)    Benign neoplasm of right choroid    BPH (benign prostatic hyperplasia)    Brain tumor (benign) (HCC)    Cataract    bilateral,removed   Chest pain    Chronic headaches    Constipation    Diverticulosis of colon    Dysplastic nevus    Dysrhythmia 2019   Afib   Fatigue    Gastroesophageal reflux disease without esophagitis    GERD (gastroesophageal reflux disease)    prn med. 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 CFT <3 seconds. Normal hair growth on digits. No edema.  Skin. No lacerations or abrasions bilateral feet.  Musculoskeletal: MMT 5/5 bilateral lower extremities in DF, PF, Inversion and Eversion. Deceased ROM in DF of ankle joint.  Tender to watershed area of achilles tendon. No pain near insertion. No pain with calf squeeze. No pain with DF or PF of the ankle. No pain plantarly or around PT tendon.  Neurological: Sensation intact to light touch.   Assessment:   1. Achilles tendinitis, right leg        Plan:  Patient was evaluated and treated and all questions answered. -Xrays reviewed. No acute fractures or dislocations. Mild haglunds deformity noted. No spurring -Discussed Achilles insertional tendonitis and treatment options with patient.  -Cotninue heel lifts and anti-inflammatories.  -Continue PT.  -Will plan for PRP injection. Will get scheduled.  -Discussed if no improvement will consider MRI//EPAT -Patient to return to office for PRP    Louann Sjogren, DPM

## 2022-10-11 ENCOUNTER — Ambulatory Visit (INDEPENDENT_AMBULATORY_CARE_PROVIDER_SITE_OTHER): Payer: Medicare Other | Admitting: Podiatry

## 2022-10-11 ENCOUNTER — Encounter: Payer: Self-pay | Admitting: Podiatry

## 2022-10-11 DIAGNOSIS — M7661 Achilles tendinitis, right leg: Secondary | ICD-10-CM

## 2022-10-11 NOTE — Progress Notes (Signed)
  Subjective:  Patient ID: Cory Mathis, male    DOB: 28-Nov-1950,   MRN: [id number]  Chief Complaint  Patient presents with   Foot Pain    Right achilles tendonitis prp injection needed. Blood drawn from right [facility] with 15cc of blood obtained. 6cc of PRP was obtained today.    72 y.o. male presents for follow-up of right achilles tendonitis. Here for PRP injection.  Denies any other pedal complaints. Denies n/v/f/c.   Past Medical History:  Diagnosis Date   Allergic rhinitis    Allergy    Atrial fibrillation with rapid ventricular response (HCC)    Benign neoplasm of right choroid    BPH (benign prostatic hyperplasia)    Brain tumor (benign) (HCC)    Cataract    bilateral,removed   Chest pain    Chronic headaches    Constipation    Diverticulosis of colon    Dysplastic nevus    Dysrhythmia 2019   Afib   Fatigue    Gastroesophageal reflux disease without esophagitis    GERD (gastroesophageal reflux disease)    prn med. 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 CFT <3 seconds. Normal hair growth on digits. No edema.  Skin. No lacerations or  abrasions bilateral feet.  Musculoskeletal: MMT 5/5 bilateral lower extremities in DF, PF, Inversion and Eversion. Deceased ROM in DF of ankle joint.  Tender to watershed area of achilles tendon. No pain near insertion. No pain with calf squeeze. No pain with DF or PF of the ankle. No pain plantarly or around PT tendon.  Neurological: Sensation intact to light touch.   Assessment:   1. Achilles tendinitis, right leg         Plan:  Patient was evaluated and treated and all questions answered. -Xrays reviewed. No acute fractures or dislocations. Mild haglunds deformity noted. No spurring -Discussed Achilles insertional tendonitis and treatment options with patient.  -Cotninue heel lifts and anti-inflammatories.  -Continue PT.  -PRP injection today. Procedure below.  Return in 4 weeks for recheck.   Procedure: Injection Tendon/Ligament Discussed alternatives, risks, complications and verbal consent was obtained.  Location: Right achilles tendon. Skin Prep: Alcohol. Injectate: 3 cc 2% lidocaine plain followed by PRP Injectate harvested from patient  Disposition: Patient tolerated procedure well. Injection site dressed with a band-aid.  Post-injection care was discussed and return precautions discussed.     Louann Sjogren, DPM

## 2022-10-12 DIAGNOSIS — J301 Allergic rhinitis due to pollen: Secondary | ICD-10-CM | POA: Diagnosis not present

## 2022-10-12 DIAGNOSIS — J3089 Other allergic rhinitis: Secondary | ICD-10-CM | POA: Diagnosis not present

## 2022-10-12 DIAGNOSIS — J3081 Allergic rhinitis due to animal (cat) (dog) hair and dander: Secondary | ICD-10-CM | POA: Diagnosis not present

## 2022-10-16 DIAGNOSIS — M6281 Muscle weakness (generalized): Secondary | ICD-10-CM | POA: Diagnosis not present

## 2022-10-16 DIAGNOSIS — M7661 Achilles tendinitis, right leg: Secondary | ICD-10-CM | POA: Diagnosis not present

## 2022-10-18 DIAGNOSIS — M7661 Achilles tendinitis, right leg: Secondary | ICD-10-CM | POA: Diagnosis not present

## 2022-10-18 DIAGNOSIS — M6281 Muscle weakness (generalized): Secondary | ICD-10-CM | POA: Diagnosis not present

## 2022-10-20 DIAGNOSIS — M7661 Achilles tendinitis, right leg: Secondary | ICD-10-CM | POA: Diagnosis not present

## 2022-10-20 DIAGNOSIS — N281 Cyst of kidney, acquired: Secondary | ICD-10-CM | POA: Diagnosis not present

## 2022-10-20 DIAGNOSIS — M6281 Muscle weakness (generalized): Secondary | ICD-10-CM | POA: Diagnosis not present

## 2022-10-23 DIAGNOSIS — M7661 Achilles tendinitis, right leg: Secondary | ICD-10-CM | POA: Diagnosis not present

## 2022-10-23 DIAGNOSIS — M6281 Muscle weakness (generalized): Secondary | ICD-10-CM | POA: Diagnosis not present

## 2022-10-26 DIAGNOSIS — J3081 Allergic rhinitis due to animal (cat) (dog) hair and dander: Secondary | ICD-10-CM | POA: Diagnosis not present

## 2022-10-26 DIAGNOSIS — J301 Allergic rhinitis due to pollen: Secondary | ICD-10-CM | POA: Diagnosis not present

## 2022-10-26 DIAGNOSIS — Z79899 Other long term (current) drug therapy: Secondary | ICD-10-CM | POA: Diagnosis not present

## 2022-10-26 DIAGNOSIS — J3089 Other allergic rhinitis: Secondary | ICD-10-CM | POA: Diagnosis not present

## 2022-10-27 ENCOUNTER — Telehealth: Payer: Self-pay

## 2022-10-27 NOTE — Telephone Encounter (Signed)
Per Dr Tenny Craw:   I saw pt in Aug he was seen in afib clinic in Jan I should probably see end of May/june   Appt made for 11/10/22.

## 2022-11-02 DIAGNOSIS — J069 Acute upper respiratory infection, unspecified: Secondary | ICD-10-CM | POA: Diagnosis not present

## 2022-11-02 DIAGNOSIS — Z0000 Encounter for general adult medical examination without abnormal findings: Secondary | ICD-10-CM | POA: Diagnosis not present

## 2022-11-02 DIAGNOSIS — D6869 Other thrombophilia: Secondary | ICD-10-CM | POA: Diagnosis not present

## 2022-11-02 DIAGNOSIS — K862 Cyst of pancreas: Secondary | ICD-10-CM | POA: Diagnosis not present

## 2022-11-02 DIAGNOSIS — E7800 Pure hypercholesterolemia, unspecified: Secondary | ICD-10-CM | POA: Diagnosis not present

## 2022-11-02 DIAGNOSIS — Z23 Encounter for immunization: Secondary | ICD-10-CM | POA: Diagnosis not present

## 2022-11-02 DIAGNOSIS — I480 Paroxysmal atrial fibrillation: Secondary | ICD-10-CM | POA: Diagnosis not present

## 2022-11-02 DIAGNOSIS — Z6834 Body mass index (BMI) 34.0-34.9, adult: Secondary | ICD-10-CM | POA: Diagnosis not present

## 2022-11-04 ENCOUNTER — Other Ambulatory Visit: Payer: Self-pay

## 2022-11-04 ENCOUNTER — Encounter (HOSPITAL_COMMUNITY): Payer: Self-pay | Admitting: *Deleted

## 2022-11-04 ENCOUNTER — Emergency Department (HOSPITAL_COMMUNITY)
Admission: EM | Admit: 2022-11-04 | Discharge: 2022-11-04 | Disposition: A | Discharge status: Home / Self Care | Payer: Medicare Other | Attending: Emergency Medicine | Admitting: Emergency Medicine

## 2022-11-04 DIAGNOSIS — E039 Hypothyroidism, unspecified: Secondary | ICD-10-CM | POA: Diagnosis not present

## 2022-11-04 DIAGNOSIS — I10 Essential (primary) hypertension: Secondary | ICD-10-CM | POA: Insufficient documentation

## 2022-11-04 DIAGNOSIS — R338 Other retention of urine: Secondary | ICD-10-CM | POA: Diagnosis not present

## 2022-11-04 DIAGNOSIS — R339 Retention of urine, unspecified: Secondary | ICD-10-CM | POA: Diagnosis not present

## 2022-11-04 LAB — URINALYSIS, ROUTINE W REFLEX MICROSCOPIC
Bacteria, UA: NONE SEEN
Bilirubin Urine: NEGATIVE
Glucose, UA: NEGATIVE mg/dL
Ketones, ur: NEGATIVE mg/dL
Leukocytes,Ua: NEGATIVE
Nitrite: NEGATIVE
Protein, ur: NEGATIVE mg/dL
Specific Gravity, Urine: 1.009 (ref 1.005–1.030)
pH: 6 (ref 5.0–8.0)

## 2022-11-04 NOTE — ED Provider Notes (Signed)
[place] EMERGENCY DEPARTMENT AT [facility] Provider Note   CSN: [id number] Arrival date & time: 11/04/22  4696     History  Chief Complaint  Patient presents with   Urinary Retention    Cory Mathis is a 72 y.o. male.  With a significant history including but not limited to BPH with TURP in 2019, hypertension, hypothyroidism, GERD who presents to the ED for evaluation of urinary retention.  He states he has been unable to urinate today.  He last urinated last night.  He takes [place] which reduces the urge to urinate so he typically does not have an urge.  No recent change to this.  Typically urinates approximately 6 times per day.  Reports a history of similar approximately 5 years ago which required TURP.  He has not had any issues since the TURP procedure.  Follows with [facility].  He denies abdominal pain, pelvic pain or pressure, back pain, fevers, chills.  HPI     Home Medications Prior to Admission medications   Medication Sig Start Date End Date Taking? Authorizing Provider  alendronate (FOSAMAX) 10 MG tablet Take 10 mg by mouth every evening. 03/16/22   [provider]  azaTHIOprine (IMURAN) 50 MG tablet Take 100 mg by mouth daily. 05/22/22 05/22/23  [provider]  Carboxymethylcellul-Glycerin (LUBRICATING EYE DROPS OP) Place 1 drop into both eyes daily as needed (dry eyes).    [provider]  diltiazem (CARDIZEM CD) 120 MG 24 hr capsule Take 1 capsule (120 mg total) by mouth 2 (two) times daily. 06/16/22   Pricilla Riffle, MD  EPINEPHrine 0.3 mg/0.3 mL IJ SOAJ injection Inject 0.3 mg into the muscle as needed for anaphylaxis. 08/11/20   [provider]  eszopiclone (LUNESTA) 2 MG TABS tablet Take 2 mg by mouth at bedtime. 05/26/20   [provider]  famotidine-calcium carbonate-magnesium hydroxide (PEPCID COMPLETE) 10-800-165 MG chewable tablet Chew 1 tablet by mouth at bedtime as needed (acid reflux).    [provider]  fluticasone (FLONASE) 50 MCG/ACT nasal spray Place 1 spray into both nostrils daily. OTC Flonase    [provider]  predniSONE (DELTASONE) 10 MG tablet Take 20 mg by mouth daily.    [provider]  rosuvastatin (CRESTOR) 5 MG tablet TAKE 1/2 TABLET(2.5 MG) BY MOUTH DAILY 09/14/22   Pricilla Riffle, MD  Vibegron (GEMTESA) 75 MG TABS Take 75 mg by mouth at bedtime. 03/02/22   [provider]  XARELTO 20 MG TABS tablet TAKE 1 TABLET(20 MG) BY MOUTH DAILY WITH SUPPER 10/02/22   Pricilla Riffle, MD      Allergies    Other, Sulfa antibiotics, Ambrosia artemisiifolia (ragweed) skin test, and Flagyl [metronidazole]    Review of Systems   Review of Systems  Genitourinary:  Positive for decreased urine volume.  All other systems reviewed and are negative.   Physical Exam Updated Vital Signs BP (!) 179/73   Pulse 68   Temp 98.2 F (36.8 C) (Oral)   Resp 18   Ht 5' 9.5" (1.765 m)   Wt 106.6 kg   SpO2 97%   BMI 34.21 kg/m  Physical Exam Vitals and nursing note reviewed.  Constitutional:      General: He is not in acute distress.    Appearance: Normal appearance. He is normal weight. He is not ill-appearing.     Comments: Resting comfortably in bed  HENT:     Head: Normocephalic and atraumatic.  Pulmonary:  Effort: Pulmonary effort is normal. No respiratory distress.  Abdominal:     General: Abdomen is flat.     Palpations: Abdomen is soft.     Tenderness: There is no abdominal tenderness. There is no guarding.  Musculoskeletal:        General: Normal range of motion.     Cervical back: Neck supple.  Skin:    General: Skin is warm and dry.  Neurological:     Mental Status: He is alert and oriented to person, place, and time.  Psychiatric:        Mood and Affect: Mood normal.        Behavior: Behavior normal.     ED Results / Procedures / Treatments   Labs (all labs ordered are listed, but only abnormal results are displayed) Labs  Reviewed  URINALYSIS, ROUTINE W REFLEX MICROSCOPIC - Abnormal; Notable for the following components:      Result Value   Hgb urine dipstick SMALL (*)    All other components within normal limits    EKG None  Radiology No results found.  Procedures Procedures    Medications Ordered in ED Medications - No data to display  ED Course/ Medical Decision Making/ A&P                             Medical Decision Making Amount and/or Complexity of Data Reviewed Labs: ordered.  This patient presents to the ED for concern of urinary retention, this involves an extensive number of treatment options, and is a complaint that carries with it a high risk of complications and morbidity.  The differential diagnosis includes BPH or other prostate abnormality, urethral stricture, foreign body, other bladder outlet obstruction, acute renal failure or renal obstruction, dehydration  Co morbidities that complicate the patient evaluation  BPH with TURP in 2019, hypertension, hypothyroidism, GERD  My initial workup includes urinalysis, pre and post void bladder scan  Additional history obtained from: Nursing notes from this visit.  I ordered, reviewed and interpreted labs which include: urinalysis.  Small hemoglobin, otherwise normal  Afebrile, hypertensive but otherwise hemodynamically stable.  72 year old male presenting to the ED for evaluation of urinary retention.  This is asymptomatic.  Specifically denies urge, lower abdominal fullness, straining.  Initial bladder scan revealed approximately 130 cc of urine in the bladder.  Patient then voided spontaneously while in the ED.  Postvoid scan revealed 0 cc residual volume.  Urinalysis negative for signs of infection.  Suspect mild dehydration as the cause of his symptoms.  He is no longer retaining urine.  He states he is able to follow-up with [facility].  He was encouraged to do so if his symptoms do not improve.  He is encouraged to  increase his water intake as well. He was given return precautions. Stable at discharge.   At this time there does not appear to be any evidence of an acute emergency medical condition and the patient appears stable for discharge with appropriate outpatient follow up. Diagnosis was discussed with patient who verbalizes understanding of care plan and is agreeable to discharge. I have discussed return precautions with patient who verbalizes understanding. Patient encouraged to follow-up with their primary urologist. All questions answered.  Patient's case discussed with Dr. Wilkie Aye who agrees with plan to discharge with follow-up.   Note: Portions of this report may have been transcribed using voice recognition software. Every effort was made to ensure accuracy; however, inadvertent  computerized transcription errors may still be present.        Final Clinical Impression(s) / ED Diagnoses Final diagnoses:  Acute urinary retention    Rx / DC Orders ED Discharge Orders     None         Michelle Piper, PA-C 11/04/22 1110    Horton, Clabe Seal, DO 11/04/22 1303

## 2022-11-04 NOTE — ED Triage Notes (Signed)
Pt presents with urinary retention since yesterday, states, had this before, TURP procedure with no problems till yesterday.

## 2022-11-04 NOTE — Discharge Instructions (Signed)
You have been seen today for your complaint of urinary retention. Your lab work was negative for any signs of urinary infection. Home care instructions are as follows:  Increase your water intake Follow up with: Urology.  Call to schedule an appointment for an ED follow-up visit. Please seek immediate medical care if you develop any of the following symptoms: You have chills or a fever. You have blood in your urine. You have a catheter and the following happens: Your catheter stops draining urine. Your catheter falls out. At this time there does not appear to be the presence of an emergent medical condition, however there is always the potential for conditions to change. Please read and follow the below instructions.  Do not take your medicine if  develop an itchy rash, swelling in your mouth or lips, or difficulty breathing; call 911 and seek immediate emergency medical attention if this occurs.  You may review your lab tests and imaging results in their entirety on your MyChart account.  Please discuss all results of fully with your primary care provider and other specialist at your follow-up visit.  Note: Portions of this text may have been transcribed using voice recognition software. Every effort was made to ensure accuracy; however, inadvertent computerized transcription errors may still be present.

## 2022-11-07 ENCOUNTER — Ambulatory Visit (INDEPENDENT_AMBULATORY_CARE_PROVIDER_SITE_OTHER): Payer: Medicare Other | Admitting: Podiatry

## 2022-11-07 DIAGNOSIS — M7661 Achilles tendinitis, right leg: Secondary | ICD-10-CM

## 2022-11-07 DIAGNOSIS — G629 Polyneuropathy, unspecified: Secondary | ICD-10-CM | POA: Diagnosis not present

## 2022-11-07 DIAGNOSIS — R200 Anesthesia of skin: Secondary | ICD-10-CM | POA: Diagnosis not present

## 2022-11-07 NOTE — Progress Notes (Signed)
  Subjective:  Patient ID: Cory Mathis, male    DOB: [birth year],   MRN: [id number]  Chief Complaint  Patient presents with   Follow-up    PRP f/u patient states PRP was painful after 2wks but since then foot pain has been much better    72 y.o. male presents for follow-up of right achilles tendonitis. Relates after two weeks and stopping PT the pain in his achilles improved about 80%   Denies any other pedal complaints. Denies n/v/f/c.   Past Medical History:  Diagnosis Date   Allergic rhinitis    Allergy    Atrial fibrillation with rapid ventricular response (HCC)    Benign neoplasm of right choroid    BPH (benign prostatic hyperplasia)    Brain tumor (benign) (HCC)    Cataract    bilateral,removed   Chest pain    Chronic headaches    Constipation    Diverticulosis of colon    Dysplastic nevus    Dysrhythmia 2019   Afib   Fatigue    Gastroesophageal reflux disease without esophagitis    GERD (gastroesophageal reflux disease)    prn med. 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 CFT <3 seconds. Normal hair growth on digits. No edema.  Skin.  No lacerations or abrasions bilateral feet.  Musculoskeletal: MMT 5/5 bilateral lower extremities in DF, PF, Inversion and Eversion. Deceased ROM in DF of ankle joint.  Tender to watershed area of achilles tendon. No pain near insertion. No pain with calf squeeze. No pain with DF or PF of the ankle. No pain plantarly or around PT tendon.  Neurological: Sensation intact to light touch.   Assessment:   1. Achilles tendinitis, right leg   2. Neuropathy   3. Numbness of feet          Plan:  Patient was evaluated and treated and all questions answered. -Xrays reviewed. No acute fractures or dislocations. Mild haglunds deformity noted. No spurring -Discussed Achilles insertional tendonitis and treatment options with patient.  -Cotninue heel lifts and anti-inflammatories.  -Discontinue PT.  Discharged from achilles standpoing  Discussed neuropathy and etiology as well as treatment with patient.  Radiographs reviewed and discussed with patient.  -Will order NCV/EMG to further evaluate.  -Patient to return after NCV.    Louann Sjogren, DPM

## 2022-11-08 NOTE — Progress Notes (Signed)
Cardiology Office Note   Date:  11/10/2022   ID:  Cory Mathis, DOB 1951-01-17, MRN [id number]  PCP:  Pcp, No  Cardiologist:   Dietrich Pates, MD    Pt presents for f/u of CAD and PAF   History of Present Illness: Cory Mathis is a 72 y.o. 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   In  June 2020 he was placed on cardiazem and Xarelto   The pt is followed at [facility] for neuro and rheum     The pt was seen in afib clinic by A Tillery in Aug 2022.  Underwnt cardioversion after.  He was seen by C lambert in March 2023   Afib burden low  May 2023    Felt sluggish  Found to be back in  afib  Underwent DCCV    I saw the pt in clinic in June 2023   He underewent DCCV in Dec 2023  Felt better   Since then he has bee seen in afib clinic, last in Jan 2024   He was in SR at that times   Not interested in afib ablation  Possible AAD if recurrent afib     Since seen he denies CP   Breathing is OK   No dizziness  No palpitations  Activity lmited now by Achilles tendinitis      Current Meds  Medication Sig   azaTHIOprine (IMURAN) 50 MG tablet Take 100 mg by mouth daily.   Carboxymethylcellul-Glycerin (LUBRICATING EYE DROPS OP) Place 1 drop into both eyes daily as needed (dry eyes).   diltiazem (CARDIZEM CD) 120 MG 24 hr capsule Take 1 capsule (120 mg total) by mouth 2 (two) times daily.   EPINEPHrine 0.3 mg/0.3 mL IJ SOAJ injection Inject 0.3 mg into the muscle as needed for anaphylaxis.   eszopiclone (LUNESTA) 2 MG TABS tablet Take 2 mg by mouth at bedtime.   famotidine-calcium carbonate-magnesium hydroxide (PEPCID COMPLETE) 10-800-165 MG chewable tablet Chew 1 tablet by mouth at bedtime as needed (acid reflux).   fluticasone (FLONASE) 50 MCG/ACT nasal spray Place 1 spray into both nostrils daily. OTC Flonase   predniSONE (DELTASONE) 10 MG tablet Take 20 mg by  mouth daily.   rosuvastatin (CRESTOR) 5 MG tablet TAKE 1/2 TABLET(2.5 MG) BY MOUTH DAILY   Vibegron (GEMTESA) 75 MG TABS Take 75 mg by mouth at bedtime.   VITAMIN D PO Take 1 tablet by mouth daily.   XARELTO 20 MG TABS tablet TAKE 1 TABLET(20 MG) BY MOUTH DAILY WITH SUPPER     Allergies:   Other, Sulfa antibiotics, Ambrosia artemisiifolia (ragweed) skin test, and Flagyl [metronidazole]   Past Medical History:  Diagnosis Date   Allergic rhinitis    Allergy    Atrial fibrillation with rapid ventricular response (HCC)    Benign neoplasm of right choroid    BPH (benign prostatic hyperplasia)    Brain tumor (benign) (HCC)    Cataract    bilateral,removed   Chest pain    Chronic headaches    Constipation    Diverticulosis of colon    Dysplastic nevus    Dysrhythmia 2019   Afib   Fatigue    Gastroesophageal reflux disease without esophagitis    GERD (gastroesophageal reflux disease)    prn med. control   H/O thyroidectomy    Headache    History of  adenomatous polyp of colon    03-29-2007  tubular adenoma/  08-07-2016  hyperplastic and tubular adenoma's   History of alcoholic gastritis    10-30-2008  gastritis, esophagitis, peptic duodenitis   History of colonic diverticulitis    01-31-2016  resolved w/ medications   History of Helicobacter pylori infection    10-30-2008   History of thyroid nodule    thyroid multinodule goiter left side s/p  left thyroidectomy   Hypertension    Hypothyroidism    Insomnia    Internal hemorrhoids    Left leg weakness    Memory loss    Nocturia    OSA on CPAP    CPAP nightly   Paresthesia of both feet    Retinal pigmentation    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HCC)    Right thyroid nodule    Sinus bradycardia    Sleep apnea    c pap   Urinary retention 12/13/2016    Past Surgical History:  Procedure Laterality Date   APPLICATION OF CRANIAL NAVIGATION N/A 04/06/2020   Procedure: APPLICATION OF CRANIAL NAVIGATION;  Surgeon: Jadene Pierini,  MD;  Location: [facility] OR;  Service: Neurosurgery;  Laterality: N/A;   BRAIN SURGERY     CARDIOVERSION N/A 12/23/2018   Procedure: CARDIOVERSION;  Surgeon: Jodelle Red, MD;  Location: [facility] ENDOSCOPY;  Service: Cardiovascular;  Laterality: N/A;   CARDIOVERSION N/A 02/08/2021   Procedure: CARDIOVERSION;  Surgeon: Pricilla Riffle, MD;  Location: [facility] ENDOSCOPY;  Service: Cardiovascular;  Laterality: N/A;   CARDIOVERSION N/A 06/01/2022   Procedure: CARDIOVERSION;  Surgeon: Little Ishikawa, MD;  Location: [facility] ENDOSCOPY;  Service: Cardiovascular;  Laterality: N/A;   COLONOSCOPY  last one 08-07-2016   EYE SURGERY     FRAMELESS  BIOPSY WITH BRAINLAB Left 04/06/2020   Procedure: Left Stereotactic brain biopsy with brainlab;  Surgeon: Jadene Pierini, MD;  Location: [facility] OR;  Service: Neurosurgery;  Laterality: Left;   HEMORRHOID BANDING  10-20-2016;  09-11-2016   HERNIA REPAIR     KNEE ARTHROSCOPY Bilateral    10 + yrs ago   LASIK Bilateral    PILONIDAL CYST EXCISION     POLYPECTOMY     THYROIDECTOMY Left 2013   TRANSURETHRAL RESECTION OF PROSTATE N/A 01/04/2017   Procedure: TRANSURETHRAL RESECTION OF THE PROSTATE (TURP);  Surgeon: Marcine Matar, MD;  Location: [facility];  Service: Urology;  Laterality: N/A;   UPPER GASTROINTESTINAL ENDOSCOPY  10/30/2008   WISDOM TOOTH EXTRACTION       Social History:  The patient  reports that he has quit smoking. He has never used smokeless tobacco. He reports current alcohol use. He reports that he does not use drugs.   Family History:  The patient's family history includes Heart attack in his mother; Other in his father and sister; Prostate cancer in his maternal grandfather.    ROS:  Please see the history of present illness. All other systems are reviewed and  Negative to the above problem except as noted.    PHYSICAL EXAM: VS:  BP 138/70   Pulse 71   Ht 5\' 10"  (1.778 m)   Wt 234 lb 6.4 oz (106.3 kg)   SpO2 96%    BMI 33.63 kg/m    GEN:  Pt is in no acute distress  HEENT: normal  Neck: no JVD, no  bruits Cardiac: RRR; no murmurs  No LE edema  Good PT pulses  Respiratory:  clear to auscultation GI: soft, nontender   No hepatomegaly  EKG:  EKG is ordered today  SR 66 RBBB   Lipid Panel    Component Value Date/Time   CHOL 106 08/22/2019 1432   TRIG 118 08/22/2019 1432   HDL 38 (L) 08/22/2019 1432   CHOLHDL 2.8 08/22/2019 1432   LDLCALC 47 08/22/2019 1432      Wt Readings from Last 3 Encounters:  11/10/22 234 lb 6.4 oz (106.3 kg)  11/04/22 235 lb (106.6 kg)  08/25/22 235 lb (106.6 kg)      ASSESSMENT AND PLAN:  1  PAF  Pt remains in SR    Will continue on current regimen   Follow up with afib clinic in Winter  I will follow up after     2  Hx of CAD.  On CT scan  pt without symtoms  His activity is limited some but I do no get a hx suspicous for angina  3  HTN  BP is not optimal  At home he says in 130s to 150s   WIll add low dose losartan   Follow BP    4   HL LDL in May 2024 was 59  HDL 39  Trig 69  Great control  Continue   5  Neuro   Continues to follow at [facility] Seen earlier this year     6  CV dz  Atherosclerosis on head CT    Rx lipids and BP    Current medicines are reviewed at length with the patient today.  The patient does not have concerns regarding medicines.  Signed, Dietrich Pates, MD  11/10/2022 2:05 PM    [facility] [address] [place], [place], [place]  [address] Phone: [phone]; Fax: [phone]

## 2022-11-10 ENCOUNTER — Telehealth: Payer: Self-pay | Admitting: Internal Medicine

## 2022-11-10 ENCOUNTER — Encounter: Payer: Self-pay | Admitting: Internal Medicine

## 2022-11-10 ENCOUNTER — Ambulatory Visit: Payer: Medicare Other | Attending: Internal Medicine | Admitting: Internal Medicine

## 2022-11-10 VITALS — BP 138/70 | HR 71 | Ht 70.0 in | Wt 234.4 lb

## 2022-11-10 DIAGNOSIS — I4819 Other persistent atrial fibrillation: Secondary | ICD-10-CM

## 2022-11-10 NOTE — Telephone Encounter (Signed)
After review with pharmD I would recomm starting losartan 25 mg  FOllow BP

## 2022-11-10 NOTE — Patient Instructions (Signed)
Medication Instructions:   *If you need a refill on your cardiac medications before your next appointment, please call your pharmacy*   Lab Work:  If you have labs (blood work) drawn today and your tests are completely normal, you will receive your results only by: MyChart Message (if you have MyChart) OR A paper copy in the mail If you have any lab test that is abnormal or we need to change your treatment, we will call you to review the results.   Testing/Procedures:    Follow-Up: At [facility], you and your health needs are our priority.  As part of our continuing mission to provide you with exceptional heart care, we have created designated Provider Care Teams.  These Care Teams include your primary Cardiologist (physician) and Advanced Practice Providers (APPs -  Physician Assistants and Nurse Practitioners) who all work together to provide you with the care you need, when you need it.  We recommend signing up for the patient portal called "MyChart".  Sign up information is provided on this After Visit Summary.  MyChart is used to connect with patients for Virtual Visits (Telemedicine).  Patients are able to view lab/test results, encounter notes, upcoming appointments, etc.  Non-urgent messages can be sent to your provider as well.   To learn more about what you can do with MyChart, go to [email or web address].    

## 2022-11-13 MED ORDER — LOSARTAN POTASSIUM 25 MG PO TABS: 25.0000 mg | ORAL_TABLET | Freq: Every day | ORAL | 3 refills | Status: DC

## 2022-11-13 NOTE — Telephone Encounter (Signed)
Pt advised and will keep a log of his BP and let us know how he is doing.

## 2022-11-13 NOTE — Addendum Note (Signed)
Addended by: Bertram Millard on: 11/13/2022 10:20 AM   Modules accepted: Orders

## 2022-11-14 ENCOUNTER — Ambulatory Visit: Payer: Medicare Other | Admitting: Internal Medicine

## 2022-11-14 VITALS — BP 148/82 | HR 79 | Temp 98.7°F | Resp 18 | Wt 237.0 lb

## 2022-11-14 DIAGNOSIS — J019 Acute sinusitis, unspecified: Secondary | ICD-10-CM | POA: Diagnosis not present

## 2022-11-14 MED ORDER — FEXOFENADINE HCL 60 MG PO TABS: 60.0000 mg | ORAL_TABLET | Freq: Two times a day (BID) | ORAL | 0 refills | Status: DC

## 2022-11-14 MED ORDER — SALINE NASAL SPRAY 0.65 % NA SOLN: 1.0000 | NASAL | 12 refills | Status: DC | PRN

## 2022-11-14 NOTE — Progress Notes (Signed)
   Acute Office Visit  Subjective:     Patient ID: Cory Mathis, male    DOB: [birth year], 72 y.o.   MRN: [id number]  Chief Complaint  Patient presents with   OFFICE VISIT    Runny nose for 4 days covid test was done yesterday it was negative     HPI Patient is in today for running nose and sneezing for 4 days.  He has a low-grade fever.  No wheezing, no shortness of breath.  He did not take any medication.  He thought that next day it will get better.  He did COVID test yesterday that was negative.  He is on tapering dose of prednisone.  No facial pressure and no sore throat.  He has obstructive sleep apnea and when he takes CPAP he sleep good at nighttime.  Review of Systems  Constitutional: Negative.   HENT:  Positive for congestion.   Respiratory:  Positive for cough. Negative for sputum production, shortness of breath and wheezing.   Cardiovascular: Negative.         Objective:    BP (!) 148/82 (BP Location: Left Arm, Patient Position: Sitting, Cuff Size: Normal)   Pulse 79   Temp 98.7 F (37.1 C)   Resp 18   Wt 237 lb (107.5 kg)   SpO2 97%   BMI 34.01 kg/m    Physical Exam Constitutional:      Appearance: Normal appearance. He is obese.  HENT:     Head: Normocephalic and atraumatic.  Cardiovascular:     Rate and Rhythm: Regular rhythm.     Heart sounds: Normal heart sounds.  Pulmonary:     Effort: Pulmonary effort is normal.     Breath sounds: Normal breath sounds.  Neurological:     Mental Status: He is alert.     No results found for any visits on 11/14/22.      Assessment & Plan:   Problem List Items Addressed This Visit       Respiratory   Acute rhinosinusitis - Primary    He will drink plenty of water.  He will take fexofenadine twice a day and saline mist.  If he is not better he will call us.       No orders of the defined types were placed in this encounter.   No follow-ups on file.  Eloisa Northern, MD

## 2022-11-14 NOTE — Assessment & Plan Note (Signed)
He will drink plenty of water.  He will take fexofenadine twice a day and saline mist.  If he is not better he will call us.

## 2022-11-16 ENCOUNTER — Encounter: Payer: Self-pay | Admitting: Internal Medicine

## 2022-11-16 DIAGNOSIS — Z79899 Other long term (current) drug therapy: Secondary | ICD-10-CM

## 2022-11-17 DIAGNOSIS — J3089 Other allergic rhinitis: Secondary | ICD-10-CM | POA: Diagnosis not present

## 2022-11-17 DIAGNOSIS — J301 Allergic rhinitis due to pollen: Secondary | ICD-10-CM | POA: Diagnosis not present

## 2022-11-17 DIAGNOSIS — J3081 Allergic rhinitis due to animal (cat) (dog) hair and dander: Secondary | ICD-10-CM | POA: Diagnosis not present

## 2022-11-17 NOTE — Telephone Encounter (Signed)
Glad to hear his BP is running better      25 mg losartan is  a very low dose   Can advance as needed  Goal for BP 110s /120s/ very low 130s  I a few weeks I would just check a BMET to make sure K is OK   At his convenience

## 2022-11-27 DIAGNOSIS — J40 Bronchitis, not specified as acute or chronic: Secondary | ICD-10-CM | POA: Diagnosis not present

## 2022-11-27 DIAGNOSIS — J329 Chronic sinusitis, unspecified: Secondary | ICD-10-CM | POA: Diagnosis not present

## 2022-11-27 DIAGNOSIS — R053 Chronic cough: Secondary | ICD-10-CM | POA: Diagnosis not present

## 2022-11-27 DIAGNOSIS — R5383 Other fatigue: Secondary | ICD-10-CM | POA: Diagnosis not present

## 2022-11-28 ENCOUNTER — Encounter: Payer: Self-pay | Admitting: Podiatry

## 2022-11-28 ENCOUNTER — Ambulatory Visit (INDEPENDENT_AMBULATORY_CARE_PROVIDER_SITE_OTHER): Payer: Medicare Other | Admitting: Podiatry

## 2022-11-28 ENCOUNTER — Telehealth: Payer: Self-pay | Admitting: *Deleted

## 2022-11-28 ENCOUNTER — Ambulatory Visit: Payer: Medicare Other | Admitting: Podiatry

## 2022-11-28 DIAGNOSIS — G629 Polyneuropathy, unspecified: Secondary | ICD-10-CM

## 2022-11-28 DIAGNOSIS — M7661 Achilles tendinitis, right leg: Secondary | ICD-10-CM | POA: Diagnosis not present

## 2022-11-28 NOTE — Progress Notes (Signed)
Subjective:  Patient ID: Cory Mathis, male    DOB: 07-04-1950,   MRN: [id number]  Chief Complaint  Patient presents with   Foot Pain    Right foot pain patient states pain has varied , its been on and off.     72 y.o. male presents for follow-up of right achilles tendonitis. Relates after two weeks had no pain for about three weeks but now the pain is back and unbale to walk. Relates he has not been called for NCV.   Denies any other pedal complaints. Denies n/v/f/c.   Past Medical History:  Diagnosis Date   Allergic rhinitis    Allergy    Atrial fibrillation with rapid ventricular response (HCC)    Benign neoplasm of right choroid    BPH (benign prostatic hyperplasia)    Brain tumor (benign) (HCC)    Cataract    bilateral,removed   Chest pain    Chronic headaches    Constipation    Diverticulosis of colon    Dysplastic nevus    Dysrhythmia 2019   Afib   Fatigue    Gastroesophageal reflux disease without esophagitis    GERD (gastroesophageal reflux disease)    prn med. 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 CFT <3 seconds. Normal hair  growth on digits. No edema.  Skin. No lacerations or abrasions bilateral feet.  Musculoskeletal: MMT 5/5 bilateral lower extremities in DF, PF, Inversion and Eversion. Deceased ROM in DF of ankle joint.  Tender to watershed area of achilles tendon. No pain near insertion. No pain with calf squeeze. No pain with DF or PF of the ankle. No pain plantarly or around PT tendon.  Neurological: Sensation intact to light touch.   Assessment:   1. Achilles tendinitis, right leg   2. Neuropathy           Plan:  Patient was evaluated and treated and all questions answered. -Xrays reviewed. No acute fractures or dislocations. Mild haglunds deformity noted. No spurring -Discussed Achilles insertional tendonitis and treatment options with patient.  -Cotninue heel lifts and anti-inflammatories.  -Slowly continue stretching.  Will try offloading with a CAM boot dispensed today to see if this will calm things down.  Consider MRI next time as well.  Discussed neuropathy and etiology as well as treatment with patient. Discussed this could potentially be plaing a role.  Radiographs reviewed and discussed with patient.  -Will order NCV/EMG to further evaluate.  -Patient  to return after NCV.    Louann Sjogren, DPM

## 2022-11-28 NOTE — Telephone Encounter (Signed)
Called [facility], left message to call back for the status of the NCV

## 2022-11-28 NOTE — Telephone Encounter (Signed)
-----   Message from Louann Sjogren, DPM sent at 11/28/2022 10:47 AM EDT ----- I had ordered a NCV for this patient and has not heard anything. Would you be able to look into this for me? Thanks

## 2022-11-28 NOTE — Telephone Encounter (Signed)
This is Cory Mathis with [facility] calling Ammie back regarding a referral for [facility]. We have not received a referral for this patient but looking into his chart, he's established with another neurologist. It looks like he's been to [facility] and [facility]. So if you have any questions, you can give [place] a call back at [phone].

## 2022-11-29 ENCOUNTER — Other Ambulatory Visit: Payer: Self-pay | Admitting: Podiatry

## 2022-11-29 DIAGNOSIS — R200 Anesthesia of skin: Secondary | ICD-10-CM

## 2022-11-29 DIAGNOSIS — M7661 Achilles tendinitis, right leg: Secondary | ICD-10-CM

## 2022-11-29 DIAGNOSIS — G629 Polyneuropathy, unspecified: Secondary | ICD-10-CM

## 2022-12-01 ENCOUNTER — Other Ambulatory Visit: Payer: Self-pay | Admitting: Internal Medicine

## 2022-12-01 ENCOUNTER — Ambulatory Visit: Payer: Medicare Other | Attending: Internal Medicine

## 2022-12-01 DIAGNOSIS — J3089 Other allergic rhinitis: Secondary | ICD-10-CM | POA: Diagnosis not present

## 2022-12-01 DIAGNOSIS — J3081 Allergic rhinitis due to animal (cat) (dog) hair and dander: Secondary | ICD-10-CM | POA: Diagnosis not present

## 2022-12-01 DIAGNOSIS — J301 Allergic rhinitis due to pollen: Secondary | ICD-10-CM | POA: Diagnosis not present

## 2022-12-01 DIAGNOSIS — Z79899 Other long term (current) drug therapy: Secondary | ICD-10-CM

## 2022-12-01 LAB — BASIC METABOLIC PANEL
BUN/Creatinine Ratio: 12 (ref 10–24)
BUN: 10 mg/dL (ref 8–27)
CO2: 26 mmol/L (ref 20–29)
Calcium: 9.7 mg/dL (ref 8.6–10.2)
Chloride: 101 mmol/L (ref 96–106)
Creatinine, Ser: 0.86 mg/dL (ref 0.76–1.27)
Glucose: 94 mg/dL (ref 70–99)
Potassium: 4.2 mmol/L (ref 3.5–5.2)
Sodium: 140 mmol/L (ref 134–144)
eGFR: 91 mL/min/{1.73_m2} (ref >59)

## 2022-12-07 ENCOUNTER — Telehealth: Payer: Self-pay | Admitting: Internal Medicine

## 2022-12-07 DIAGNOSIS — I776 Arteritis, unspecified: Secondary | ICD-10-CM | POA: Diagnosis not present

## 2022-12-07 NOTE — Telephone Encounter (Signed)
Pt c/o medication issue:  1. Name of Medication:  losartan (COZAAR) 25 MG tablet   2. How are you currently taking this medication (dosage and times per day)?   Take 1 tablet (25 mg total) by mouth daily.     3. Are you having a reaction (difficulty breathing--STAT)? No   4. What is your medication issue? Pt states that the above medication is causing his BP to be lower than it should be. Pt's PCP told pt that they needed to follow up with the cardiologist because the numbers were 117/45 and 120/44. Please advise.

## 2022-12-07 NOTE — Telephone Encounter (Signed)
Spoke with pt who states he was seen today by rheumatology and recommended pt contact Dr Tenny Craw re: low BP's of 117/45 and 120/44.   Pt states he checks BP at home every other day with readings in the 120's/upper 50's and HR's upper 50's-60's.  Pt is taking medications as prescribed.  He denies current CP, SOB, dizziness.  Currently not in Afib and states he could probably be better hydrated.  Does report increased fatigue over the past month.   Pt advised will forward information to Dr Tenny Craw for review and further recommendations.  Pt verbalizes understanding and agrees with current plan.

## 2022-12-08 NOTE — Telephone Encounter (Signed)
Patient sent in my chart message requesting phone call.  I spoke with patient.  He is calling regarding BP readings at Rheumatology office.  His BP at home today is 144/73.  He did not take losartan today.  His home readings have all been in the 120's/60's while taking losartan.  No diastolic readings in the 40's. No complaints except has been feeling tired.  I told him since BP has been running OK at home he could resume losartan but let us know if any low readings.  Patient aware message will be sent to Dr Tenny Craw.  He requests call back with her recommendations.

## 2022-12-08 NOTE — Telephone Encounter (Signed)
I spoke with patient.  He is writing regarding BP at rheumatology office .  See phone note

## 2022-12-10 DIAGNOSIS — M7661 Achilles tendinitis, right leg: Secondary | ICD-10-CM | POA: Diagnosis not present

## 2022-12-10 DIAGNOSIS — M6281 Muscle weakness (generalized): Secondary | ICD-10-CM | POA: Diagnosis not present

## 2022-12-11 NOTE — Telephone Encounter (Signed)
Spoke with pt.  BP at home 120s/60s    He is feeling OK   Continue meds   Continue monitoring

## 2022-12-19 DIAGNOSIS — I776 Arteritis, unspecified: Secondary | ICD-10-CM | POA: Diagnosis not present

## 2022-12-20 ENCOUNTER — Encounter: Payer: Self-pay | Admitting: Pulmonary Disease

## 2022-12-22 DIAGNOSIS — J3089 Other allergic rhinitis: Secondary | ICD-10-CM | POA: Diagnosis not present

## 2022-12-22 DIAGNOSIS — J3081 Allergic rhinitis due to animal (cat) (dog) hair and dander: Secondary | ICD-10-CM | POA: Diagnosis not present

## 2022-12-22 DIAGNOSIS — J301 Allergic rhinitis due to pollen: Secondary | ICD-10-CM | POA: Diagnosis not present

## 2023-01-03 DIAGNOSIS — H1789 Other corneal scars and opacities: Secondary | ICD-10-CM | POA: Diagnosis not present

## 2023-01-03 DIAGNOSIS — H26493 Other secondary cataract, bilateral: Secondary | ICD-10-CM | POA: Diagnosis not present

## 2023-01-03 DIAGNOSIS — H5213 Myopia, bilateral: Secondary | ICD-10-CM | POA: Diagnosis not present

## 2023-01-03 DIAGNOSIS — H35371 Puckering of macula, right eye: Secondary | ICD-10-CM | POA: Diagnosis not present

## 2023-01-03 DIAGNOSIS — H3581 Retinal edema: Secondary | ICD-10-CM | POA: Diagnosis not present

## 2023-01-03 DIAGNOSIS — D3131 Benign neoplasm of right choroid: Secondary | ICD-10-CM | POA: Diagnosis not present

## 2023-01-03 DIAGNOSIS — H04123 Dry eye syndrome of bilateral lacrimal glands: Secondary | ICD-10-CM | POA: Diagnosis not present

## 2023-01-05 DIAGNOSIS — I776 Arteritis, unspecified: Secondary | ICD-10-CM | POA: Diagnosis not present

## 2023-01-05 DIAGNOSIS — I6602 Occlusion and stenosis of left middle cerebral artery: Secondary | ICD-10-CM | POA: Diagnosis not present

## 2023-01-05 DIAGNOSIS — G9389 Other specified disorders of brain: Secondary | ICD-10-CM | POA: Diagnosis not present

## 2023-01-08 ENCOUNTER — Encounter: Payer: Self-pay | Admitting: Podiatry

## 2023-01-08 ENCOUNTER — Ambulatory Visit: Payer: Medicare Other | Admitting: Podiatry

## 2023-01-08 DIAGNOSIS — J3089 Other allergic rhinitis: Secondary | ICD-10-CM | POA: Diagnosis not present

## 2023-01-08 DIAGNOSIS — M7661 Achilles tendinitis, right leg: Secondary | ICD-10-CM

## 2023-01-08 DIAGNOSIS — J301 Allergic rhinitis due to pollen: Secondary | ICD-10-CM | POA: Diagnosis not present

## 2023-01-08 DIAGNOSIS — J3081 Allergic rhinitis due to animal (cat) (dog) hair and dander: Secondary | ICD-10-CM | POA: Diagnosis not present

## 2023-01-08 NOTE — Progress Notes (Signed)
  Subjective:  Patient ID: Cory Mathis, male    DOB: Feb 15, 1951,   MRN: [id number]  Chief Complaint  Patient presents with   Foot Pain    Pt stated that he still gets  pain when he walks.    72 y.o. male presents for follow-up of right achilles tendonitis. Relates boot maybe helped a little but still having a good amount of pain. Has appointment with neurology this week.   Denies any other pedal complaints. Denies n/v/f/c.   Past Medical History:  Diagnosis Date   Allergic rhinitis    Allergy    Atrial fibrillation with rapid ventricular response (HCC)    Benign neoplasm of right choroid    BPH (benign prostatic hyperplasia)    Brain tumor (benign) (HCC)    Cataract    bilateral,removed   Chest pain    Chronic headaches    Constipation    Diverticulosis of colon    Dysplastic nevus    Dysrhythmia 2019   Afib   Fatigue    Gastroesophageal reflux disease without esophagitis    GERD (gastroesophageal reflux disease)    prn med. 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 CFT <3 seconds. Normal hair growth on digits. No edema.  Skin. No  lacerations or abrasions bilateral feet.  Musculoskeletal: MMT 5/5 bilateral lower extremities in DF, PF, Inversion and Eversion. Deceased ROM in DF of ankle joint.  Tender to watershed area of achilles tendon. No pain near insertion. No pain with calf squeeze. No pain with DF or PF of the ankle. No pain plantarly or around PT tendon.  Neurological: Sensation intact to light touch.   Assessment:   1. Achilles tendinitis, right leg            Plan:  Patient was evaluated and treated and all questions answered. -Xrays reviewed. No acute fractures or dislocations. Mild haglunds deformity noted. No spurring -Discussed Achilles insertional tendonitis and treatment options with patient.  -Cotninue heel lifts and anti-inflammatories.  -Slowly continue stretching.  Continue boot as needed.  MRI ordered for further evalutation and possible surgical planning.  Discussed neuropathy and etiology as well as treatment with patient. Discussed this could potentially be plaing a role.  Radiographs reviewed and discussed with patient.  -appointment with neurolgoy Follow-up after MRI    Louann Sjogren, DPM

## 2023-01-13 DIAGNOSIS — I6602 Occlusion and stenosis of left middle cerebral artery: Secondary | ICD-10-CM | POA: Diagnosis not present

## 2023-01-13 DIAGNOSIS — I6613 Occlusion and stenosis of bilateral anterior cerebral arteries: Secondary | ICD-10-CM | POA: Diagnosis not present

## 2023-01-13 DIAGNOSIS — R930 Abnormal findings on diagnostic imaging of skull and head, not elsewhere classified: Secondary | ICD-10-CM | POA: Diagnosis not present

## 2023-01-13 DIAGNOSIS — I776 Arteritis, unspecified: Secondary | ICD-10-CM | POA: Diagnosis not present

## 2023-01-17 DIAGNOSIS — H35433 Paving stone degeneration of retina, bilateral: Secondary | ICD-10-CM | POA: Diagnosis not present

## 2023-01-17 DIAGNOSIS — D3132 Benign neoplasm of left choroid: Secondary | ICD-10-CM | POA: Diagnosis not present

## 2023-01-17 DIAGNOSIS — D3131 Benign neoplasm of right choroid: Secondary | ICD-10-CM | POA: Diagnosis not present

## 2023-01-21 ENCOUNTER — Ambulatory Visit: Admission: RE | Admit: 2023-01-21 | Payer: Medicare Other | Source: Ambulatory Visit

## 2023-01-21 DIAGNOSIS — M7661 Achilles tendinitis, right leg: Secondary | ICD-10-CM

## 2023-01-21 DIAGNOSIS — Z01818 Encounter for other preprocedural examination: Secondary | ICD-10-CM | POA: Diagnosis not present

## 2023-01-21 DIAGNOSIS — M67873 Other specified disorders of tendon, right ankle and foot: Secondary | ICD-10-CM | POA: Diagnosis not present

## 2023-01-25 DIAGNOSIS — Z9225 Personal history of immunosupression therapy: Secondary | ICD-10-CM | POA: Diagnosis not present

## 2023-01-25 DIAGNOSIS — I776 Arteritis, unspecified: Secondary | ICD-10-CM | POA: Diagnosis not present

## 2023-01-25 DIAGNOSIS — Z79899 Other long term (current) drug therapy: Secondary | ICD-10-CM | POA: Diagnosis not present

## 2023-01-29 ENCOUNTER — Ambulatory Visit (INDEPENDENT_AMBULATORY_CARE_PROVIDER_SITE_OTHER): Payer: Medicare Other | Admitting: Podiatry

## 2023-01-29 ENCOUNTER — Encounter: Payer: Self-pay | Admitting: Podiatry

## 2023-01-29 DIAGNOSIS — M7661 Achilles tendinitis, right leg: Secondary | ICD-10-CM

## 2023-01-29 DIAGNOSIS — J3081 Allergic rhinitis due to animal (cat) (dog) hair and dander: Secondary | ICD-10-CM | POA: Diagnosis not present

## 2023-01-29 DIAGNOSIS — J3089 Other allergic rhinitis: Secondary | ICD-10-CM | POA: Diagnosis not present

## 2023-01-29 DIAGNOSIS — G629 Polyneuropathy, unspecified: Secondary | ICD-10-CM | POA: Diagnosis not present

## 2023-01-29 DIAGNOSIS — J301 Allergic rhinitis due to pollen: Secondary | ICD-10-CM | POA: Diagnosis not present

## 2023-01-29 NOTE — Progress Notes (Signed)
Subjective:  Patient ID: Cory Mathis, male    DOB: 1951/04/30,   MRN: [id number]  Chief Complaint  Patient presents with   Foot Pain    Pt still gets pain when he walks, the swelling went down but still sore.    72 y.o. male presents for follow-up of right achilles tendonitis. Relates boot maybe helped a little but still having a good amount of pain. Relates he has been on prednisone for another issues and mildly helping. Here to review MRI. Has appointment with neurology this week.   Denies any other pedal complaints. Denies n/v/f/c.   Past Medical History:  Diagnosis Date   Allergic rhinitis    Allergy    Atrial fibrillation with rapid ventricular response (HCC)    Benign neoplasm of right choroid    BPH (benign prostatic hyperplasia)    Brain tumor (benign) (HCC)    Cataract    bilateral,removed   Chest pain    Chronic headaches    Constipation    Diverticulosis of colon    Dysplastic nevus    Dysrhythmia 2019   Afib   Fatigue    Gastroesophageal reflux disease without esophagitis    GERD (gastroesophageal reflux disease)    prn med. control   H/O thyroidectomy    Headache    History of adenomatous polyp of colon    03-29-2007  tubular adenoma/  08-07-2016  hyperplastic and tubular adenoma's   History of alcoholic gastritis    10-30-2008  gastritis, esophagitis, peptic duodenitis   History of colonic diverticulitis    01-31-2016  resolved w/ medications   History of Helicobacter pylori infection    10-30-2008   History of thyroid nodule    thyroid multinodule goiter left side s/p  left thyroidectomy   Hypertension    Hypothyroidism    Insomnia    Internal hemorrhoids    Left leg weakness    Memory loss    Nocturia    OSA on CPAP    CPAP nightly   Paresthesia of both feet    Retinal pigmentation    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HCC)    Right thyroid nodule    Sinus bradycardia    Sleep apnea    c pap   Urinary retention 12/13/2016    Objective:  Physical  Exam: Vascular: DP/PT pulses 2/4 bilateral. CFT <3 seconds. Normal hair growth on digits. No edema.  Skin. No lacerations or abrasions bilateral feet.  Musculoskeletal: MMT 5/5 bilateral lower extremities in DF, PF, Inversion and Eversion. Deceased ROM in DF of ankle joint.  Tender to watershed area of achilles tendon. No pain near insertion. No pain with calf squeeze. No pain with DF or PF of the ankle. No pain plantarly or around PT tendon.  Neurological: Sensation intact to light touch.   Pending MRI official read  Significant degeneration and calcification noted on tendon proximal to the insertion site. No major changes noted at the distal insertion site.   Assessment:   1. Achilles tendinitis, right leg   2. Neuropathy             Plan:  Patient was evaluated and treated and all questions answered. -Xrays reviewed. No acute fractures or dislocations. Mild haglunds deformity noted. No spurring -Discussed Achilles insertional tendonitis and treatment options with patient.  -Cotninue heel lifts and steroids until completion.   -Slowly continue stretching.  Continue boot as needed. Patient previous boot worn and no longer functioning. Replacement boot provided.   MRI reviewed with  patient and discussed treatment options.  Discussed at this point options are sugical debridement and possible tendon transfer vs EPAT.  Discussed neuropathy and etiology as well as treatment with patient. Discussed this could potentially be plaing a role.  Radiographs reviewed and discussed with patient.  -appointment with neurology tomorrow .   Return when makes decision on surgery or not. Patient hopefully more prednisone and stretching will help.    Louann Sjogren, DPM

## 2023-01-30 ENCOUNTER — Encounter: Payer: Self-pay | Admitting: Pulmonary Disease

## 2023-01-30 ENCOUNTER — Encounter: Payer: Self-pay | Admitting: Neurology

## 2023-01-30 ENCOUNTER — Ambulatory Visit (INDEPENDENT_AMBULATORY_CARE_PROVIDER_SITE_OTHER): Payer: Medicare Other | Admitting: Neurology

## 2023-01-30 VITALS — BP 151/74 | HR 54 | Ht 69.5 in | Wt 229.5 lb

## 2023-01-30 DIAGNOSIS — R269 Unspecified abnormalities of gait and mobility: Secondary | ICD-10-CM | POA: Diagnosis not present

## 2023-01-30 DIAGNOSIS — R202 Paresthesia of skin: Secondary | ICD-10-CM

## 2023-01-30 NOTE — Progress Notes (Addendum)
Chief Complaint  Patient presents with   New Patient (Initial Visit)    Rm14, alone  Neuropathy vs potential neuritis:bilateral foot numbness whole bottom ongoing for 5 years. He complains of difficulty walking from being numb. Pt stated that he plans to start rituximab infusions       ASSESSMENT AND PLAN  Thoryn Davidoff is a 72 y.o. 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 Liz Malady,  Brain biopsy showed hypercellular brain tissue, with no inflammatory infiltration, the inflammatory cells including CD3 positive T cells, CD20 positive B cells, and CD68 positive macrophages, there is no evidence of vasculitis of granulomatous inflammation, organisms are not identified, LDH high, R132H, toxoplasmosis, SV40 and HSV immunohistochemical stains are negative, the histology does not suggest a glial neuroblastoma,  Patient has temporary improvement clinical and imaging wise following steroid treatment,   He was seen by [facility] neurologist Dr. 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  The biopsy shows hypercellular brain tissue with a brisk perivascular  and parenchymal inflammatory infiltrate.  The inflammatory cells include  CD3 positive T cells, CD20 positive B cells and CD68 positive  macrophages.  There is no evidence of vasculitis or granulomatous  inflammation, and organisms are not identified.  IDH1 R132H,  toxoplasmosis, SV40 and HSV  immunohistochemical stains are negative.  The differential diagnosis may include inflammatory or infectious  etiologies, demyelinating disease and others.  The histology does not  suggest a glial neoplasm, although due to clinical concern for  glioblastoma we are performing EGFR and TERT studies to further  evaluate.  If the lesion progresses or if there is concern for  potentially undersampled lesion consider repeat biopsy if clinically    INTERPRETATION SUMMARY:  EGFR does not exhibit amplification. The number of cells with a gain is less than 15%.   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 No interval evolution or specific finding to clarify the diagnosis.  HISTORICAL Nyle Olesh is a 72 year old male, seen in refer by primary care doctor Juluis Rainier, for evaluation short-term memory loss, initial evaluation was on July 25, 2017.   I reviewed and summarized the referring note, he has past history of anxiety, hearing loss, goiter status post resection, hyperlipidemia, hypertension, he is a retired Microbiologist from [employer], moved to [place] in April 2015.   He also reported a history of fever of unknown etiology, had 2 episodes 20 years apart, most recent episode was in 2014, fever up to 103, lasting for couple weeks, without clear etiology found, he was seen by different specialist, including rheumatologist, ID specialist, symptoms eventually improved by steroid tapering dose, treatment lasted for a few months.   Since 2015, he noticed mild memory loss, he can still keep very complicated spreadsheets without difficulty driving without loss, but in his daily activities, he let the water running for hours, sometimes forget why he goes to upstairs,   He also complains  of chronic insomnia, gradually getting worse, was seen by sleep specialist Dr.  Allie Dimmer, was diagnosis of obstructive sleep apnea, using CPAP machine, also taking titrating dose of Ambien 12.5 mg every night, but sometimes it does not work.   UPDATE September 17 2017: He got fever again at the end of February, had extensive laboratory evaluation by his primary care doctor Via, was also seen by infectious disease Dr. Orvan Falconer, no etiology was found, he was treated with prednisone, whenever he taper off the prednisone, his fever come back at 4 PM, now is on 25 mg daily, continue complains of difficulty sleeping at nighttime, taking Ambien 12.5 mg daily We have personally reviewed MRI of the brain without contrast in February 2019 that was normal   Laboratory evaluation showed normal CBC CMP, TSH, fasting lipid profile, LDL was 86  Update September 28, 2020 He lost to follow-up since April 2019, on October 2021, he complains of increased headache, mental confusion, right arm weakness, CT head and later MRI of the brain with without contrast March 30, 2020 showed 4.5 cm infiltrating mass centered at the base of the brain on the left with involvement of the basal ganglion, nonradiating white matter tracts, mass-effect upon left lateral ventricle, no midline shift, which 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 Autumn Patty on April 06, 2020, pathology reported hypercellular brain tissue with inflammatory infiltration,   The biopsy shows hypercellular brain tissue with a brisk perivascular  and parenchymal inflammatory infiltrate.  The inflammatory cells include  CD3 positive T cells, CD20 positive B cells and CD68 positive  macrophages.  There is no evidence of vasculitis or granulomatous  inflammation, and organisms are not identified.  IDH1 R132H,  toxoplasmosis, SV40 and HSV immunohistochemical stains are negative.  The differential diagnosis may include inflammatory or infectious  etiologies, demyelinating disease  and others.  The histology does not  suggest a glial neoplasm, although due to clinical concern for  glioblastoma we are performing EGFR and TERT studies to further  evaluate.  If the lesion progresses or if there is concern for  potentially undersampled lesion consider repeat biopsy if clinically  indicated EGFR does not exhibit amplification, the number of cells with again is less than 15%,  He was managed by Dr. 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  Patient complete Decadron 2 mg from November 18 to May 09, 2020, steroid free for few days, then developed a rash on his face, and neck, did not improve with hydrocortisone cream, on May 17, 2020, he was given hydrocortisone 20 mg in the morning, 30 tablets,  He presented to emergency room on June 19, 2020 for balance issues, lightheadedness, fall, worsening headache, he did not get second brain biopsy because significant improvement with steroid  treatment continue MRI surveillance  Since February 2022, he also began to experience episode of sudden onset loss control of his leg, fell to the ground, less than 1 minute, Dr. Barbaraann Cao started  Keppra 500 mg twice daily, patient only take it for 3 days, complains of dizziness, increased leg weakness, Keppra was stopped, he was given another trial of dexamethasone on July 19, 2020 4 mg daily for 1 week, reduce to 2 mg daily on July 27, 2020  At most recent follow-up with Dr. Barbaraann Cao August 09, 2020, patient and his wife describe ongoing deficit including short-term memory loss, occasional loss of balance, dropped things from his right hand, improved  headache control  MRI of brain with without contrast on June 19, 2020, slight interval increase in T2/flair signal abnormality involving left basal ganglia compared to previous MRI on April 30, 2020,  MRI of the brain with without contrast August 06, 2020, this is following her second round of Decadron treatment, again substantial decrease of T2 hyperdensity in the left basal ganglion and white matter, that correlate with steroid treatment  Extensive laboratory evaluation showed normal or negative  CMP, UDS, elevated WBC 11.7, normal hemoglobin 14.2, negative Lyme titer, C-reactive protein, ANA, TSH, LDL 47  UPDATE Oct 13 2020: Patient and his wife return to clinic to review repeat MRI on October 08, 2020: Which showed recurrent disease in and about the left basal ganglion, although not as extensive as symptom presentation, but certainly showed clear appearing recurrent disease compared to his most recent MRI scan in February,  following his second dose of Decadron treatment  Patient also reported clinical worsening, he has increased episode of intermittent right arm and leg weakness, his right leg would give out on him, each episode lasted about few seconds, no loss of consciousness, but wife reported that during the spell he looks spaced out,  He complains of mild irritation with Keppra 500 mg twice daily, also reported increased episode of transient right-sided weakness, he contributed to the side effect of Keppra, has stopped  taking medicine,   He was contacted by [facility] Dr. Neale Burly, who has reviewed his recent MRI of the brain, suggested having not to follow-up with [facility],  After extensive discussion with patient and his wife, he wants to proceed with further evaluation with his ongoing worsening clinical and MRI findings, I have suggested spinal fluid testing, he wants to hold off at this point, would rather go to [facility] Dr. Melrose Nakayama for second opinion  UPDATE November 10 2020: Patient and his wife came urgently to the office without appointment, he was given prescription of lamotrigine 25 mg, supposed to titrating up lamotrigine to target dose of 100 mg twice a day, instead he never filled his lamotrigine 25 mg prescription, begin taking lamotrigine 100 mg daily for 1 week, then twice daily for 1 week, over the past few days, he has been taking 100 mg 3 times daily  His wife reported that over the past few weeks he has slow worsening functional status, more episode of confusion, right-sided weakness, unsteadiness, he has stopped lamotrigine yesterday after talking with our office, lamotrigine level is pending today  Wife is very anxious, so is patient frustrated about his worsening functional status, repeat MRI of the brain in April 2022 clearly showed recurrent left basal ganglia lesion in comparison to February 2022, where he had improvement following steroid treatment, we again talked about potential further evaluation with lumbar puncture, he and his wife  prefer to have second opinion at [facility] neurology  UPDATE December 23 2020: Virtual visit with Dr .Barbaraann Cao on November 11 2020, start prednisone 40mg  daily, arranged to [facility] in Sept 2022,   repeating MRI brain w/wo planned for January 07 2021,  He had prednisone 40mg  x 10 day 20mg  x3 wees,  When dose was tapered down to 10mg  feel terrible, began to have more recurrent episode of transient right-sided weakness, now back up to 20  mg daily, still has couple times each week, he described when he stands up from sitting position, he felt dizziness, unsteady, lose control of right leg, as if he is going to fall, he would stand there for few seconds, then he can continue  UPDATE January 30 2023: He was seen by [facility] Dr. Corwin Levins in Sep 2022, diagnosed with CNS vasculitis, based abnormality of the proximal blood vessel of the brain by angiogram plus ischemia on CT perfusion study, there was thought to have enhancement around the middle cerebral artery on MRI, brain biopsy showed perivascular inflammation, he was treated aggressively with prednisone, cyclophosphamide but he still have spells of weakness in his legs, sometimes involving his hand,  Spinal fluid and serology test was not helpful  He was also treated with CellCept 500 mg 3 tablets twice a day for short while in August 2023  Now began to receiving rituximab, he will continue his CNS vasculitis care at [facility], return to clinic today complains of numbness tingling at bilateral feet since 2020, this was couple years prior to his diagnosis of CNS vasculitis, there was no significant change over the past 4 years even with the immunosuppressive treatment, he recently suffered right foot Achilles tendinitis, wearing right boot for 4 months, mild gait abnormality because of that  REVIEW OF SYSTEMS:  Full 14 system review of systems performed and notable only for as above All other review of systems were negative.  PHYSICAL EXAM:   Vitals:   01/30/23 0805  BP: (!) 151/74  Pulse: (!) 54  SpO2: 96%  Weight: 229 lb 8 oz (104.1 kg)  Height: 5' 9.5" (1.765 m)   Not recorded     Body mass index is 33.41 kg/m.  PHYSICAL EXAMNIATION:  Gen: NAD, conversant, well nourised, well groomed                     Cardiovascular: Regular rate rhythm, no peripheral edema, warm, nontender. Eyes: Conjunctivae clear without exudates or hemorrhage Neck: Supple, no carotid  bruits. Pulmonary: Clear to auscultation bilaterally   NEUROLOGICAL EXAM:  MENTAL STATUS: Speech: Moon face, alert oriented to history taking and care conversation      09/28/2020   11:52 AM  Montreal Cognitive Assessment   Visuospatial/ Executive (0/5) 5  Naming (0/3) 3  Attention: Read list of digits (0/2) 2  Attention: Read list of letters (0/1) 1  Attention: Serial 7 subtraction starting at 100 (0/3) 3  Language: Repeat phrase (0/2) 2  Language : Fluency (0/1) 0  Abstraction (0/2) 2  Delayed Recall (0/5) 3  Orientation (0/6) 6  Total 27  Adjusted Score (based on education) 27     CRANIAL NERVES: CN II: Visual fields are full to confrontation. Pupils are round equal and briskly reactive to light. CN III, IV, VI: extraocular movement are normal. No ptosis. CN V: Facial sensation is intact to light touch CN VII: Face is symmetric with normal eye closure  CN VIII: Hearing is normal to causal conversation. CN  IX, X: Phonation is normal. CN XI: Head turning and shoulder shrug are intact  MOTOR: No significant bilateral upper or lower extremity proximal and distal muscle weakness, swelling of right Achilles tendon,  REFLEXES: Brisk reflex of bilateral upper and lower extremity  SENSORY: Absent vibratory sensation at toes, preserved to pinprick,  COORDINATION: There is no trunk or limb dysmetria noted.  GAIT/STANCE: Push-up to get up from seated position, mildly antalgic due to right Achilles tendon pain  ALLERGIES: Allergies  Allergen Reactions   Other     Mints - makes him sneeze   Sulfa Antibiotics Hives and Rash   Ambrosia Artemisiifolia (Ragweed) Skin Test Other (See Comments)    hayfever   Flagyl [Metronidazole] Other (See Comments)    Pt reported he was light headed for 2 weeks.      HOME MEDICATIONS: Current Outpatient Medications  Medication Sig Dispense Refill   azaTHIOprine (IMURAN) 50 MG tablet Take 100 mg by mouth daily.      Carboxymethylcellul-Glycerin (LUBRICATING EYE DROPS OP) Place 1 drop into both eyes daily as needed (dry eyes).     diltiazem (CARDIZEM CD) 120 MG 24 hr capsule TAKE 1 CAPSULE(120 MG) BY MOUTH TWICE DAILY 180 capsule 3   EPINEPHrine 0.3 mg/0.3 mL IJ SOAJ injection Inject 0.3 mg into the muscle as needed for anaphylaxis.     eszopiclone (LUNESTA) 2 MG TABS tablet Take 2 mg by mouth at bedtime.     famotidine-calcium carbonate-magnesium hydroxide (PEPCID COMPLETE) 10-800-165 MG chewable tablet Chew 1 tablet by mouth at bedtime as needed (acid reflux).     losartan (COZAAR) 25 MG tablet Take 1 tablet (25 mg total) by mouth daily. 90 tablet 3   predniSONE (DELTASONE) 10 MG tablet Take 20 mg by mouth daily.     rosuvastatin (CRESTOR) 5 MG tablet TAKE 1/2 TABLET(2.5 MG) BY MOUTH DAILY 45 tablet 3   Vibegron (GEMTESA) 75 MG TABS Take 75 mg by mouth at bedtime.     VITAMIN D PO Take 1 tablet by mouth daily.     XARELTO 20 MG TABS tablet TAKE 1 TABLET(20 MG) BY MOUTH DAILY WITH SUPPER 90 tablet 1   No current facility-administered medications for this visit.    PAST MEDICAL HISTORY: Past Medical History:  Diagnosis Date   Allergic rhinitis    Allergy    Atrial fibrillation with rapid ventricular response (HCC)    Benign neoplasm of right choroid    BPH (benign prostatic hyperplasia)    Brain tumor (benign) (HCC)    Cataract    bilateral,removed   Chest pain    Chronic headaches    Constipation    Diverticulosis of colon    Dysplastic nevus    Dysrhythmia 2019   Afib   Fatigue    Gastroesophageal reflux disease without esophagitis    GERD (gastroesophageal reflux disease)    prn med. control   H/O thyroidectomy    Headache    History of adenomatous polyp of colon    03-29-2007  tubular adenoma/  08-07-2016  hyperplastic and tubular adenoma's   History of alcoholic gastritis    10-30-2008  gastritis, esophagitis, peptic duodenitis   History of colonic diverticulitis    01-31-2016   resolved w/ medications   History of Helicobacter pylori infection    10-30-2008   History of thyroid nodule    thyroid multinodule goiter left side s/p  left thyroidectomy   Hypertension    Hypothyroidism    Insomnia  Internal hemorrhoids    Left leg weakness    Memory loss    Nocturia    OSA on CPAP    CPAP nightly   Paresthesia of both feet    Retinal pigmentation    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HCC)    Right thyroid nodule    Sinus bradycardia    Sleep apnea    c pap   Urinary retention 12/13/2016    PAST SURGICAL HISTORY: Past Surgical History:  Procedure Laterality Date   APPLICATION OF CRANIAL NAVIGATION N/A 04/06/2020   Procedure: APPLICATION OF CRANIAL NAVIGATION;  Surgeon: Jadene Pierini, MD;  Location: [facility] OR;  Service: Neurosurgery;  Laterality: N/A;   BRAIN SURGERY     CARDIOVERSION N/A 12/23/2018   Procedure: CARDIOVERSION;  Surgeon: Jodelle Red, MD;  Location: [facility] ENDOSCOPY;  Service: Cardiovascular;  Laterality: N/A;   CARDIOVERSION N/A 02/08/2021   Procedure: CARDIOVERSION;  Surgeon: Pricilla Riffle, MD;  Location: [facility] ENDOSCOPY;  Service: Cardiovascular;  Laterality: N/A;   CARDIOVERSION N/A 06/01/2022   Procedure: CARDIOVERSION;  Surgeon: Little Ishikawa, MD;  Location: [facility] ENDOSCOPY;  Service: Cardiovascular;  Laterality: N/A;   COLONOSCOPY  last one 08-07-2016   EYE SURGERY     FRAMELESS  BIOPSY WITH BRAINLAB Left 04/06/2020   Procedure: Left Stereotactic brain biopsy with brainlab;  Surgeon: Jadene Pierini, MD;  Location: [facility] OR;  Service: Neurosurgery;  Laterality: Left;   HEMORRHOID BANDING  10-20-2016;  09-11-2016   HERNIA REPAIR     KNEE ARTHROSCOPY Bilateral    10 + yrs ago   LASIK Bilateral    PILONIDAL CYST EXCISION     POLYPECTOMY     THYROIDECTOMY Left 2013   TRANSURETHRAL RESECTION OF PROSTATE N/A 01/04/2017   Procedure: TRANSURETHRAL RESECTION OF THE PROSTATE (TURP);  Surgeon: Marcine Matar, MD;  Location: [facility];  Service: Urology;  Laterality: N/A;   UPPER GASTROINTESTINAL ENDOSCOPY  10/30/2008   WISDOM TOOTH EXTRACTION      FAMILY HISTORY: Family History  Problem Relation Age of Onset   Heart attack Mother        49's   Other Father        atherosclerosis - 7's   Other Sister        brain tumor - unsure if it was cancer - 73's   Prostate cancer Maternal Grandfather    Colon cancer Neg Hx    Esophageal cancer Neg Hx    Pancreatic cancer Neg Hx    Rectal cancer Neg Hx    Stomach cancer Neg Hx    Colon polyps Neg Hx    Crohn's disease Neg Hx     SOCIAL HISTORY: Social History   Socioeconomic History   Marital status: Married    Spouse name: Not on file   Number of children: 0   Years of education: 16+   Highest education level: Bachelor's degree (e.g., BA, AB, BS)  Occupational History   Occupation: Retired  Tobacco Use   Smoking status: Former   Smokeless tobacco: Never   Tobacco comments:    Former smoker 10/24/21  Vaping Use   Vaping status: Never Used  Substance and Sexual Activity   Alcohol use: Not Currently    Comment: social    Drug use: No   Sexual activity: Not on file  Other Topics Concern   Not on file  Social History Narrative   Left-handed.   1.5 cups caffeine per day.   Lives at home with  his wife.   Social Determinants of Health   Financial Resource Strain: Not on file  Food Insecurity: Low Risk  (08/18/2022)   [facility]   Food vital sign    Within the past 12 months, you worried that your food would run out before you got money to buy more: Never true    Within the past 12 months, the food you bought just didn't last and you didn't have money to get more. : Never true  Transportation Needs: No Transportation Needs (08/18/2022)   [facility]   Transportation    In the past 12 months, has lack of reliable transportation kept you from medical appointments, meetings,  work or from getting things needed for daily living? : No  Physical Activity: Not on file  Stress: Not on file  Social Connections: Not on file  Intimate Partner Violence: Not on file   Levert Feinstein, M.D. Ph.D.  [facility] [address], [address] [place], [place] [address] Ph: [phone] Fax: [phone]  CC:  Louann Sjogren, DPM [address] [place],  [place] [address]  Pcp, No

## 2023-02-02 DIAGNOSIS — Z23 Encounter for immunization: Secondary | ICD-10-CM | POA: Diagnosis not present

## 2023-02-07 DIAGNOSIS — I776 Arteritis, unspecified: Secondary | ICD-10-CM | POA: Diagnosis not present

## 2023-02-07 DIAGNOSIS — G0481 Other encephalitis and encephalomyelitis: Secondary | ICD-10-CM | POA: Diagnosis not present

## 2023-02-07 DIAGNOSIS — D8989 Other specified disorders involving the immune mechanism, not elsewhere classified: Secondary | ICD-10-CM | POA: Diagnosis not present

## 2023-02-08 DIAGNOSIS — M7661 Achilles tendinitis, right leg: Secondary | ICD-10-CM | POA: Diagnosis not present

## 2023-02-08 DIAGNOSIS — M6281 Muscle weakness (generalized): Secondary | ICD-10-CM | POA: Diagnosis not present

## 2023-02-09 ENCOUNTER — Ambulatory Visit (INDEPENDENT_AMBULATORY_CARE_PROVIDER_SITE_OTHER): Payer: Medicare Other | Admitting: Neurology

## 2023-02-09 ENCOUNTER — Ambulatory Visit (INDEPENDENT_AMBULATORY_CARE_PROVIDER_SITE_OTHER): Payer: Self-pay | Admitting: Neurology

## 2023-02-09 VITALS — BP 147/67 | HR 50 | Ht 69.5 in | Wt 229.0 lb

## 2023-02-09 DIAGNOSIS — R202 Paresthesia of skin: Secondary | ICD-10-CM

## 2023-02-09 DIAGNOSIS — Z0289 Encounter for other administrative examinations: Secondary | ICD-10-CM

## 2023-02-09 DIAGNOSIS — G6289 Other specified polyneuropathies: Secondary | ICD-10-CM

## 2023-02-09 DIAGNOSIS — R269 Unspecified abnormalities of gait and mobility: Secondary | ICD-10-CM

## 2023-02-09 DIAGNOSIS — G629 Polyneuropathy, unspecified: Secondary | ICD-10-CM | POA: Insufficient documentation

## 2023-02-09 DIAGNOSIS — M79605 Pain in left leg: Secondary | ICD-10-CM

## 2023-02-09 NOTE — Procedures (Signed)
Full Name: Cory Mathis Gender: Male MRN #: [id number] Date of Birth: 06-19-1950    Visit Date: 02/09/2023 07:31 Age: 72 Years Examining Physician: Dr. Levert Feinstein Referring Physician: Dr. Levert Feinstein Height: 5 feet 10 inch History: 72 year old male with history of CNS vasculitis, left basal ganglion lesion, presenting with more than 5 years history of bilateral feet numbness tingling, with no significant progression of his symptoms over the years, he denies significant neck pain, has urinary urgency frequency, in the setting of prostate hypertrophy.  Summary of the tests:  Nerve conduction study: Bilateral sural, left superficial peroneal sensory responses showed mildly prolonged peak latency with moderately decreased snap amplitude.  Left superficial peroneal sensory responses were absent.    There was technical difficulty due to bilateral lower extremity pitting edema, right worse than left, he just recovered from right Achilles tendinitis.  Left median, ulnar sensory responses also demonstrate mildly prolonged peak latency with mild to moderately decreased snap amplitude.  Left radial sensory responses were within normal limit.  Bilateral tibial motor responses showed no significant abnormality.  Right peroneal to EDB motor response was absent.  Left peroneal to EDB motor response showed moderately decreased CMAP amplitude.  Left median ulnar motor responses were within normal limits  Electromyography: Selected needle examinations of bilateral lower extremity muscles and bilateral lumbosacral paraspinal muscles were performed.  There was evidence of chronic neuropathic changes at bilateral L4-5 S1 myotomes.  There was no spontaneous activity at bilateral lumbosacral paraspinal muscles.  Conclusion: This is an abnormal yet technically difficult study due to bilateral lower extremity pitting edema. 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   In addition, there is evidence of chronic bilateral lumbosacral radiculopathy, involving bilateral L4-S1 myotomes.    ------------------------------- Levert Feinstein, M.D. Ph.D.  [facility] [address], [address] [place], [place] [address] Tel: [phone] Fax: [phone]  Verbal informed consent was obtained from the patient, patient was informed of potential risk of procedure, including bruising, bleeding, hematoma formation, infection, muscle weakness, muscle pain, numbness, among others.        MNC    Nerve / Sites Muscle Latency Ref. Amplitude Ref. Rel Amp Segments Distance Velocity Ref. Area    ms ms mV mV %  cm m/s m/s mVms  L Median - APB     Wrist APB 3.5 ?4.4 7.1 ?4.0 100 Wrist - APB 7   25.2     Upper arm APB 9.0  5.4  75.7 Upper arm - Wrist 28 51 ?49 20.0  L Ulnar - ADM     Wrist ADM 3.0 ?3.3 6.6 ?6.0 100 Wrist - ADM 7   26.4     B.Elbow ADM 6.1  7.1  108 B.Elbow - Wrist 14 46 ?49 27.1     A.Elbow ADM 10.1  6.2  86.6 A.Elbow - B.Elbow 17 42 ?49 24.0  L Peroneal - EDB     Ankle EDB 5.5 ?6.5 1.0 ?2.0 100 Ankle - EDB 9   2.4     Fib head EDB 12.5  0.7  75.7 Fib head - Ankle 28 40 ?44 2.9     Pop fossa EDB 16.5  1.8  245 Pop fossa - Fib head 11.6 29 ?44 7.2         Pop fossa - Ankle      R Peroneal - EDB     Ankle EDB NR ?6.5 NR ?2.0 NR Ankle - EDB 9  NR         Pop fossa - Ankle      L Tibial - AH     Ankle AH 5.0 ?5.8 3.6 ?4.0 100 Ankle - AH 9   13.9     Pop fossa AH 14.8  3.0  84.1 Pop fossa - Ankle 41 42 ?41 13.0  R Tibial - AH     Ankle AH 5.5 ?5.8 3.6 ?4.0 100 Ankle - AH 9   10.7     Pop fossa AH 16.9  2.1  58.8 Pop fossa - Ankle 42 37 ?41 8.6                 SNC    Nerve / Sites Rec. Site Peak Lat Ref.  Amp Ref. Segments Distance    ms ms V V  cm  L Radial - Anatomical snuff box (Forearm)     Forearm Wrist 2.8 ?2.9 14 ?15 Forearm - Wrist 10  L Sural - Ankle (Calf)     Calf Ankle 3.9 ?4.4 3 ?6 Calf - Ankle 14  R Sural - Ankle (Calf)      Calf Ankle 4.3 ?4.4 3 ?6 Calf - Ankle 14  L Superficial peroneal - Ankle     Lat leg Ankle 4.3 ?4.4 3 ?6 Lat leg - Ankle 14  R Superficial peroneal - Ankle     Lat leg Ankle NR ?4.4 NR ?6 Lat leg - Ankle 14  L Median - Orthodromic (Dig II, Mid palm)     Dig II Wrist 3.5 ?3.4 4 ?10 Dig II - Wrist 13  L Ulnar - Orthodromic, (Dig V, Mid palm)     Dig V Wrist 3.3 ?3.1 4 ?5 Dig V - Wrist 33                   F  Wave    Nerve F Lat Ref.   ms ms  L Tibial - AH 63.7 ?56.0  R Tibial - AH 64.1 ?56.0  L Ulnar - ADM 34.5 ?32.0           EMG Summary Table    Spontaneous MUAP Recruitment  Muscle IA Fib PSW Fasc Other Amp Dur. Poly Pattern  L. Tibialis anterior Normal None None None _______ Normal Normal Normal Normal  L. Tibialis posterior Normal None None None _______ Normal Normal Normal Normal  L. Peroneus longus Normal None None None _______ Normal Normal Normal Normal  L. Gastrocnemius (Medial head) Normal None None None _______ Normal Normal Normal Normal  L. Vastus lateralis Normal None None None _______ Normal Normal Normal Normal  R. Tibialis anterior Normal None None None _______ Normal Normal Normal Normal  R. Tibialis posterior Normal None None None _______ Normal Normal Normal Normal  R. Peroneus longus Normal None None None _______ Normal Normal Normal Normal  R. Vastus lateralis Normal None None None _______ Normal Normal Normal Normal  R. Gastrocnemius (Medial head) Normal None None None _______ Normal Normal Normal Normal  R. Lumbar paraspinals (low) Normal None None None _______ Normal Normal Normal Normal  R. Lumbar paraspinals (mid) Normal None None None _______ Normal Normal Normal Normal  L. Lumbar paraspinals (low) Normal None None None _______ Normal Normal Normal Normal  L. Lumbar paraspinals (mid) Normal None None None _______ Normal Normal Normal Normal

## 2023-02-13 ENCOUNTER — Telehealth: Payer: Self-pay | Admitting: Neurology

## 2023-02-13 ENCOUNTER — Encounter: Payer: Self-pay | Admitting: Gastroenterology

## 2023-02-13 DIAGNOSIS — R202 Paresthesia of skin: Secondary | ICD-10-CM

## 2023-02-13 DIAGNOSIS — G6289 Other specified polyneuropathies: Secondary | ICD-10-CM

## 2023-02-13 DIAGNOSIS — Z23 Encounter for immunization: Secondary | ICD-10-CM | POA: Diagnosis not present

## 2023-02-13 DIAGNOSIS — R269 Unspecified abnormalities of gait and mobility: Secondary | ICD-10-CM

## 2023-02-13 NOTE — Telephone Encounter (Signed)
Pt said having pain and discolor in leg. Started to experience mostly the day after the Nerve Conduction and EMG. Would like a call from the nurse.

## 2023-02-13 NOTE — Telephone Encounter (Signed)
I called patient, he complains of left shin pain, discoloration,   I asked him to come in on Sept 4th 10am for me to check on him

## 2023-02-13 NOTE — Telephone Encounter (Signed)
Called and spoke to patient about pain he is having in his legs following the procedure. He states it is very bruised and discolored and painful, I advised he go to urgent care or an ER, follow up with PCP if it is impeding is daily function. He reported that he was upset that he could not follow up here, I advised we do not have an urgent care and he was stating he would go elsewhere to seek help for this. I advised I would also let the provider know.

## 2023-02-14 ENCOUNTER — Encounter: Payer: Self-pay | Admitting: Neurology

## 2023-02-14 ENCOUNTER — Ambulatory Visit (INDEPENDENT_AMBULATORY_CARE_PROVIDER_SITE_OTHER): Payer: Self-pay | Admitting: Neurology

## 2023-02-14 VITALS — BP 158/57 | HR 60 | Resp 16 | Wt 230.5 lb

## 2023-02-14 DIAGNOSIS — R202 Paresthesia of skin: Secondary | ICD-10-CM

## 2023-02-14 NOTE — Progress Notes (Signed)
Patient had a EMG nerve conduction study on February 09, 2023, after study, he complains of left anterior shin pain, but there was no swelling at that time  Over the next couple days, he complains of increased hip pain, swelling of left anterior shin, also noticed discoloration of left lateral ankle  Today's examination showed significant tenderness at mid shin, mild dark erythematous swelling spreading around it, to lateral knee level, also discoloration of lateral foot,  Most likely due to subcutaneous bleeding, he is taking Xarelto 20 mg daily, also on prednisone 30 mg daily, Imuran 100 mg daily  Just received rituximab from [place] on February 07, 2023

## 2023-02-15 DIAGNOSIS — J3089 Other allergic rhinitis: Secondary | ICD-10-CM | POA: Diagnosis not present

## 2023-02-15 DIAGNOSIS — J301 Allergic rhinitis due to pollen: Secondary | ICD-10-CM | POA: Diagnosis not present

## 2023-02-19 ENCOUNTER — Ambulatory Visit: Payer: Medicare Other | Admitting: Neurology

## 2023-02-21 ENCOUNTER — Telehealth: Payer: Self-pay | Admitting: Neurology

## 2023-02-21 ENCOUNTER — Ambulatory Visit: Payer: Medicare Other | Admitting: Neurology

## 2023-02-21 ENCOUNTER — Encounter: Payer: Self-pay | Admitting: Neurology

## 2023-02-21 DIAGNOSIS — D8989 Other specified disorders involving the immune mechanism, not elsewhere classified: Secondary | ICD-10-CM | POA: Diagnosis not present

## 2023-02-21 DIAGNOSIS — I776 Arteritis, unspecified: Secondary | ICD-10-CM | POA: Diagnosis not present

## 2023-02-21 DIAGNOSIS — R269 Unspecified abnormalities of gait and mobility: Secondary | ICD-10-CM

## 2023-02-21 DIAGNOSIS — J3089 Other allergic rhinitis: Secondary | ICD-10-CM | POA: Diagnosis not present

## 2023-02-21 DIAGNOSIS — J301 Allergic rhinitis due to pollen: Secondary | ICD-10-CM | POA: Diagnosis not present

## 2023-02-21 DIAGNOSIS — G0481 Other encephalitis and encephalomyelitis: Secondary | ICD-10-CM | POA: Diagnosis not present

## 2023-02-21 DIAGNOSIS — R202 Paresthesia of skin: Secondary | ICD-10-CM

## 2023-02-21 NOTE — Telephone Encounter (Signed)
Pt is asking to be called regarding him being able to come in this week for his leg, please call pt fo assistance on a slot being made available for him to be seen.

## 2023-02-21 NOTE — Addendum Note (Signed)
Addended by: Levert Feinstein on: 02/21/2023 11:25 AM   Modules accepted: Orders

## 2023-02-21 NOTE — Telephone Encounter (Signed)
I called patient, he is on his way to [facility] for infusion, finish around 6 pm,  could not come to clinic today.  Complains of stiffness of left leg, still painful " seem to be worse", discoloration.  I have ordered stat bilateral lower extremity arterial doppler study, [place] of right leg soft tissues  Stat refer to orthopedic clinic  I will see him at clinic Sept 12th 2024  Also discussed with Sports medicine Dr. [name], may be evaluated by him urgently after I saw him on Sept 12th.

## 2023-02-21 NOTE — Telephone Encounter (Signed)
I called patient, he is on his way to [facility] for infusion, finish around 6 pm,  could not come to clinic today.  Complains of stiffness of left leg, still painful " seem to be worse", discoloration.  I have ordered stat bilateral lower extremity arterial doppler study, [place] of right leg soft tissues  Stat refer to orthopedic clinic  I will see him at clinic Sept 12th 2024

## 2023-02-21 NOTE — Telephone Encounter (Signed)
Dr. Terrace Arabia,  Do you wish to double book him anywhere? Thanls,  Nagi Furio

## 2023-02-21 NOTE — Telephone Encounter (Signed)
Called and spoke to pt placed on schedule for tomorrow but was told by [facility] ok to come anytime

## 2023-02-22 ENCOUNTER — Encounter: Payer: Self-pay | Admitting: Neurology

## 2023-02-22 ENCOUNTER — Ambulatory Visit (INDEPENDENT_AMBULATORY_CARE_PROVIDER_SITE_OTHER): Payer: Medicare Other | Admitting: Neurology

## 2023-02-22 VITALS — BP 151/73 | HR 75 | Resp 15 | Wt 230.6 lb

## 2023-02-22 DIAGNOSIS — R202 Paresthesia of skin: Secondary | ICD-10-CM | POA: Diagnosis not present

## 2023-02-22 DIAGNOSIS — M79605 Pain in left leg: Secondary | ICD-10-CM | POA: Diagnosis not present

## 2023-02-22 NOTE — Progress Notes (Signed)
Chief Complaint  Patient presents with   Follow-up    Emg rm 4 alone, Leg problem from ZOX:WRUE leg and goot significant bruising but denies pain      ASSESSMENT AND PLAN  Cory Mathis is a 72 y.o. male    Bilateral feet paresthesia since 2019  EMG nerve conduction study February 09, 2023 demonstrated mild axonal sensorimotor polyneuropathy Left anterior shin swelling, discoloration  Following her EMG nerve conduction study needle examination on February 09, 2023, suggestive of subcutaneous bleeding at the left lateral leg, swelling has decreased compared to the initial examination on September 4, but more discoloration, I think he is on his way to recovery, just take time will refer him to sports medicine Dr. [name] for evaluation,  He was on Xarelto 20 mg, Imuran 100mg  daily, prednisone 30mg  daily, Rituximab.    DIAGNOSTIC DATA (LABS, IMAGING, TESTING) - I reviewed patient records, labs, notes, testing and imaging myself where available.  The biopsy shows hypercellular brain tissue with a brisk perivascular  and parenchymal inflammatory infiltrate.  The inflammatory cells include  CD3 positive T cells, CD20 positive B cells and CD68 positive  macrophages.  There is no evidence of vasculitis or granulomatous  inflammation, and organisms are not identified.  IDH1 R132H,  toxoplasmosis, SV40 and HSV immunohistochemical stains are negative.  The differential diagnosis may include inflammatory or infectious  etiologies, demyelinating disease and others.  The histology does not  suggest a glial neoplasm, although due to clinical concern for  glioblastoma we are performing EGFR and TERT studies to further  evaluate.  If the lesion progresses or if there is concern for  potentially undersampled lesion consider repeat biopsy if clinically    INTERPRETATION SUMMARY:  EGFR does not exhibit amplification. The number of cells with a gain is less than 15%.   MRI of brain with without  contrast on October 08, 2020 Recurring disease in and about the left basal ganglia, although not as extensive as seen at presentation. No interval evolution or specific finding to clarify the diagnosis.  HISTORICAL Cory Mathis is a 72 year old male, seen in refer by primary care doctor Juluis Mathis, for evaluation short-term memory loss, initial evaluation was on July 25, 2017.   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   He also reported a history of fever of unknown etiology, had 2 episodes 20 years apart, most recent episode was in 2014, fever up to 103, lasting for couple weeks, without clear etiology found, he was seen by different specialist, including rheumatologist, ID specialist, symptoms eventually improved by steroid tapering dose, treatment lasted for a few months.   Since 2015, he noticed mild memory loss, he can still keep very complicated spreadsheets without difficulty driving without loss, but in his daily activities, he let the water running for hours, sometimes forget why he goes to upstairs,   He also complains of chronic insomnia, gradually getting worse, was seen by sleep specialist Dr. Allie Dimmer, was diagnosis of obstructive sleep apnea, using CPAP machine, also taking titrating dose of Ambien 12.5 mg every night, but sometimes it does not work.   UPDATE September 17 2017: He got fever again at the end of February, had extensive laboratory evaluation by his primary care doctor Via, was also seen by infectious disease Dr. Orvan Falconer, no etiology  was found, he was treated with prednisone, whenever he taper off the prednisone, his fever come back at 4 PM, now is on 25 mg daily, continue complains of difficulty sleeping at nighttime, taking Ambien 12.5 mg daily We have personally  reviewed MRI of the brain without contrast in February 2019 that was normal   Laboratory evaluation showed normal CBC CMP, TSH, fasting lipid profile, LDL was 86  Update September 28, 2020 He lost to follow-up since April 2019, on October 2021, he complains of increased headache, mental confusion, right arm weakness, CT head and later MRI of the brain with without contrast March 30, 2020 showed 4.5 cm infiltrating mass centered at the base of the brain on the left with involvement of the basal ganglion, nonradiating white matter tracts, mass-effect upon left lateral ventricle, no midline shift, which 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 Autumn Patty on April 06, 2020, pathology reported hypercellular brain tissue with inflammatory infiltration,   The biopsy shows hypercellular brain tissue with a brisk perivascular  and parenchymal inflammatory infiltrate.  The inflammatory cells include  CD3 positive T cells, CD20 positive B cells and CD68 positive  macrophages.  There is no evidence of vasculitis or granulomatous  inflammation, and organisms are not identified.  IDH1 R132H,  toxoplasmosis, SV40 and HSV immunohistochemical stains are negative.  The differential diagnosis may include inflammatory or infectious  etiologies, demyelinating disease and others.  The histology does not  suggest a glial neoplasm, although due to clinical concern for  glioblastoma we are performing EGFR and TERT studies to further  evaluate.  If the lesion progresses or if there is concern for  potentially undersampled lesion consider repeat biopsy if clinically  indicated EGFR does not exhibit amplification, the number of cells with again is less than 15%,  He was managed by Dr. Elissa Hefty since Apr 16 2020, initiated extensive inflammatory laboratory work-up, also started Decadron 4 mg daily, 5 mg from November 5-18, then dosage was  decreased to 2 mg daily,  Case was discussed at brain/spinal tumor board meeting, recommended repeat tissue simply with second stereotactic biopsy, remain highly suspicious for intermediate/high grade glioma is potentially high,  Repeat MRI of the brain with without contrast following steroid treatment on April 30, 2020 showed substantial regression of the abnormal signal, mass-effect and enhancement in the left basal ganglion, no new lesion or abnormality, conclusion was the constellation of clinical and imaging findings favor all resolving subacute small vessel hemorrhage and all infarct, or resolving encephalitis, or other CNS inflammatory process is possible but less likely.  Patient complete Decadron 2 mg from November 18 to May 09, 2020, steroid free for few days, then developed a rash on his face, and neck, did not improve with hydrocortisone cream, on May 17, 2020, he was given hydrocortisone 20 mg in the morning, 30 tablets,  He presented to emergency room on June 19, 2020 for balance issues, lightheadedness, fall, worsening headache, he did not get second brain biopsy because significant improvement with steroid treatment continue MRI surveillance  Since February 2022, he also began to experience episode of sudden onset loss control of his leg, fell to the ground, less than 1 minute, Dr. Barbaraann Cao started Keppra 500 mg twice daily, patient only take it for 3 days, complains of dizziness, increased leg weakness, Keppra was stopped, he was given another trial of dexamethasone on July 19, 2020 4 mg daily for 1 week, reduce to  2 mg daily on July 27, 2020  At most recent follow-up with Dr. Barbaraann Cao August 09, 2020, patient and his wife describe ongoing deficit including short-term memory loss, occasional loss of balance, dropped things from his right hand, improved  headache control  MRI of brain with without contrast on June 19, 2020, slight interval increase in T2/flair  signal abnormality involving left basal ganglia compared to previous MRI on April 30, 2020,  MRI of the brain with without contrast August 06, 2020, this is following her second round of Decadron treatment, again substantial decrease of T2 hyperdensity in the left basal ganglion and white matter, that correlate with steroid treatment  Extensive laboratory evaluation showed normal or negative  CMP, UDS, elevated WBC 11.7, normal hemoglobin 14.2, negative Lyme titer, C-reactive protein, ANA, TSH, LDL 47  UPDATE Oct 13 2020: Patient and his wife return to clinic to review repeat MRI on October 08, 2020: Which showed recurrent disease in and about the left basal ganglion, although not as extensive as symptom presentation, but certainly showed clear appearing recurrent disease compared to his most recent MRI scan in February,  following his second dose of Decadron treatment  Patient also reported clinical worsening, he has increased episode of intermittent right arm and leg weakness, his right leg would give out on him, each episode lasted about few seconds, no loss of consciousness, but wife reported that during the spell he looks spaced out,  He complains of mild irritation with Keppra 500 mg twice daily, also reported increased episode of transient right-sided weakness, he contributed to the side effect of Keppra, has stopped taking medicine,   He was contacted by [facility] Dr. Neale Burly, who has reviewed his recent MRI of the brain, suggested having not to follow-up with [facility],  After extensive discussion with patient and his wife, he wants to proceed with further evaluation with his ongoing worsening clinical and MRI findings, I have suggested spinal fluid testing, he wants to hold off at this point, would rather go to [facility] Dr. Melrose Nakayama for second opinion  UPDATE November 10 2020: Patient and his wife came urgently to the office without appointment, he was given  prescription of lamotrigine 25 mg, supposed to titrating up lamotrigine to target dose of 100 mg twice a day, instead he never filled his lamotrigine 25 mg prescription, begin taking lamotrigine 100 mg daily for 1 week, then twice daily for 1 week, over the past few days, he has been taking 100 mg 3 times daily  His wife reported that over the past few weeks he has slow worsening functional status, more episode of confusion, right-sided weakness, unsteadiness, he has stopped lamotrigine yesterday after talking with our office, lamotrigine level is pending today  Wife is very anxious, so is patient frustrated about his worsening functional status, repeat MRI of the brain in April 2022 clearly showed recurrent left basal ganglia lesion in comparison to February 2022, where he had improvement following steroid treatment, we again talked about potential further evaluation with lumbar puncture, he and his wife prefer to have second opinion at [facility] neurology  UPDATE December 23 2020: Virtual visit with Dr .Barbaraann Cao on November 11 2020, start prednisone 40mg  daily, arranged to [facility] neuro-immunology in Sept 2022,   repeating MRI brain w/wo planned for January 07 2021,  He had prednisone 40mg  x 10 day 20mg  x3 wees,  When dose was tapered down to 10mg  feel terrible, began to have more recurrent episode of transient  right-sided weakness, now back up to 20 mg daily, still has couple times each week, he described when he stands up from sitting position, he felt dizziness, unsteady, lose control of right leg, as if he is going to fall, he would stand there for few seconds, then he can continue  UPDATE January 30 2023: He was seen by [facility] Dr. Corwin Levins in Sep 2022, diagnosed with CNS vasculitis, based abnormality of the proximal blood vessel of the brain by angiogram plus ischemia on CT perfusion study, there was thought to have enhancement around the middle cerebral artery on MRI, brain biopsy showed perivascular inflammation,  he was treated aggressively with prednisone, cyclophosphamide but he still have spells of weakness in his legs, sometimes involving his hand,  Spinal fluid and serology test was not helpful  He was also treated with CellCept 500 mg 3 tablets twice a day for short while in August 2023  Now began to receiving rituximab, he will continue his CNS vasculitis care at [facility], return to clinic today complains of numbness tingling at bilateral feet since 2020, this was couple years prior to his diagnosis of CNS vasculitis, there was no significant change over the past 4 years even with the immunosuppressive treatment, he recently suffered right foot Achilles tendinitis, wearing right boot for 4 months, mild gait abnormality because of that  UPDATE Sept 12 2024: He had a EMG nerve conduction study on February 09, 2023 for evaluation of bilateral lower extremity paresthesia, which only demonstrated mild axonal sensorimotor polyneuropathy  He did complains of left anterior shin pain after bilateral lower extremity needle examination, but there was no significant edema or active bleeding noted  He contacted office February 13, 2019 for complaints of left leg pain, discoloration, I saw him on September 4, which showed tenderness at left shin, erythematous swelling spreading to left lateral knee and to left lateral ankle, with noticeable dark blue discoloration at the left lateral foot,  Suggest subcutaneous bleeding, he is taking Xarelto 20 mg daily, also on prednisone 30 mg, Imuran 100 mg daily, received rituximab infusion at [facility] on February 07, 2023  Today he complains of worsening discoloration of left leg, but less painful, more spreading of subcutaneous blood to involving top of left toes, no gait abnormality, no muscle weakness, completed second dose of rituximab infusion at [facility] February 21, 2023  REVIEW OF SYSTEMS:  Full 14 system review of systems performed and notable only for as above All  other review of systems were negative.  PHYSICAL EXAM:   Vitals:   02/22/23 0810 02/22/23 0817  BP: (!) 152/67 (!) 151/73  Pulse: 75   Resp: 15   Weight: 230 lb 9.6 oz (104.6 kg)    Body mass index is 33.57 kg/m.  PHYSICAL EXAMNIATION:  MOTOR: Bilateral lower extremity proximal and distal muscle strength is normal, discoloration of left lateral leg, yellow-blue discoloration, indicating old blood product, further spreading of blue discoloration to top of left toes, seems to be less swelling than previous evaluation on February 14, 2023, milder tenderness upon deep palpation mid shin level  Deep tendon reflexes: Bilateral patellar was present, trace Achilles,  GAIT/STANCE: Push-up to get up from seated position, mildly antalgic    ALLERGIES: Allergies  Allergen Reactions   Other     Mints - makes him sneeze   Sulfa Antibiotics Hives and Rash   Ambrosia Artemisiifolia (Ragweed) Skin Test Other (See Comments)    hayfever   Flagyl [Metronidazole] Other (See Comments)  Pt reported he was light headed for 2 weeks.      HOME MEDICATIONS: Current Outpatient Medications  Medication Sig Dispense Refill   azaTHIOprine (IMURAN) 50 MG tablet Take 100 mg by mouth daily.     Carboxymethylcellul-Glycerin (LUBRICATING EYE DROPS OP) Place 1 drop into both eyes daily as needed (dry eyes).     diltiazem (CARDIZEM CD) 120 MG 24 hr capsule TAKE 1 CAPSULE(120 MG) BY MOUTH TWICE DAILY 180 capsule 3   EPINEPHrine 0.3 mg/0.3 mL IJ SOAJ injection Inject 0.3 mg into the muscle as needed for anaphylaxis.     eszopiclone (LUNESTA) 2 MG TABS tablet Take 2 mg by mouth at bedtime.     famotidine-calcium carbonate-magnesium hydroxide (PEPCID COMPLETE) 10-800-165 MG chewable tablet Chew 1 tablet by mouth at bedtime as needed (acid reflux).     losartan (COZAAR) 25 MG tablet Take 1 tablet (25 mg total) by mouth daily. 90 tablet 3   predniSONE (DELTASONE) 10 MG tablet Take 20 mg by mouth daily.      rosuvastatin (CRESTOR) 5 MG tablet TAKE 1/2 TABLET(2.5 MG) BY MOUTH DAILY 45 tablet 3   Vibegron (GEMTESA) 75 MG TABS Take 75 mg by mouth at bedtime.     VITAMIN D PO Take 1 tablet by mouth daily.     XARELTO 20 MG TABS tablet TAKE 1 TABLET(20 MG) BY MOUTH DAILY WITH SUPPER 90 tablet 1   No current facility-administered medications for this visit.    PAST MEDICAL HISTORY: Past Medical History:  Diagnosis Date   Allergic rhinitis    Allergy    Atrial fibrillation with rapid ventricular response (HCC)    Benign neoplasm of right choroid    BPH (benign prostatic hyperplasia)    Brain tumor (benign) (HCC)    Cataract    bilateral,removed   Chest pain    Chronic headaches    Constipation    Diverticulosis of colon    Dysplastic nevus    Dysrhythmia 2019   Afib   Fatigue    Gastroesophageal reflux disease without esophagitis    GERD (gastroesophageal reflux disease)    prn med. control   H/O thyroidectomy    Headache    History of adenomatous polyp of colon    03-29-2007  tubular adenoma/  08-07-2016  hyperplastic and tubular adenoma's   History of alcoholic gastritis    10-30-2008  gastritis, esophagitis, peptic duodenitis   History of colonic diverticulitis    01-31-2016  resolved w/ medications   History of Helicobacter pylori infection    10-30-2008   History of thyroid nodule    thyroid multinodule goiter left side s/p  left thyroidectomy   Hypertension    Hypothyroidism    Insomnia    Internal hemorrhoids    Left leg weakness    Memory loss    Nocturia    OSA on CPAP    CPAP nightly   Paresthesia of both feet    Retinal pigmentation    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HCC)    Right thyroid nodule    Sinus bradycardia    Sleep apnea    c pap   Urinary retention 12/13/2016    PAST SURGICAL HISTORY: Past Surgical History:  Procedure Laterality Date   APPLICATION OF CRANIAL NAVIGATION N/A 04/06/2020   Procedure: APPLICATION OF CRANIAL NAVIGATION;  Surgeon: Jadene Pierini, MD;  Location: [facility] OR;  Service: Neurosurgery;  Laterality: N/A;   BRAIN SURGERY     CARDIOVERSION N/A 12/23/2018   Procedure:  CARDIOVERSION;  Surgeon: Jodelle Red, MD;  Location: [facility] ENDOSCOPY;  Service: Cardiovascular;  Laterality: N/A;   CARDIOVERSION N/A 02/08/2021   Procedure: CARDIOVERSION;  Surgeon: Pricilla Riffle, MD;  Location: [facility] ENDOSCOPY;  Service: Cardiovascular;  Laterality: N/A;   CARDIOVERSION N/A 06/01/2022   Procedure: CARDIOVERSION;  Surgeon: Little Ishikawa, MD;  Location: [facility] ENDOSCOPY;  Service: Cardiovascular;  Laterality: N/A;   COLONOSCOPY  last one 08-07-2016   EYE SURGERY     FRAMELESS  BIOPSY WITH BRAINLAB Left 04/06/2020   Procedure: Left Stereotactic brain biopsy with brainlab;  Surgeon: Jadene Pierini, MD;  Location: [facility] OR;  Service: Neurosurgery;  Laterality: Left;   HEMORRHOID BANDING  10-20-2016;  09-11-2016   HERNIA REPAIR     KNEE ARTHROSCOPY Bilateral    10 + yrs ago   LASIK Bilateral    PILONIDAL CYST EXCISION     POLYPECTOMY     THYROIDECTOMY Left 2013   TRANSURETHRAL RESECTION OF PROSTATE N/A 01/04/2017   Procedure: TRANSURETHRAL RESECTION OF THE PROSTATE (TURP);  Surgeon: Marcine Matar, MD;  Location[facility];  Service: Urology;  Laterality: N/A;   UPPER GASTROINTESTINAL ENDOSCOPY  10/30/2008   WISDOM TOOTH EXTRACTION      FAMILY HISTORY: Family History  Problem Relation Age of Onset   Heart attack Mother        18's   Other Father        atherosclerosis - 66's   Other Sister        brain tumor - unsure if it was cancer - 38's   Prostate cancer Maternal Grandfather    Colon cancer Neg Hx    Esophageal cancer Neg Hx    Pancreatic cancer Neg Hx    Rectal cancer Neg Hx    Stomach cancer Neg Hx    Colon polyps Neg Hx    Crohn's disease Neg Hx     SOCIAL HISTORY: Social History   Socioeconomic History   Marital status: Married    Spouse name: Not on file   Number of children: 0    Years of education: 16+   Highest education level: Bachelor's degree (e.g., BA, AB, BS)  Occupational History   Occupation: Retired  Tobacco Use   Smoking status: Former   Smokeless tobacco: Never   Tobacco comments:    Former smoker 10/24/21  Vaping Use   Vaping status: Never Used  Substance and Sexual Activity   Alcohol use: Not Currently    Comment: social    Drug use: No   Sexual activity: Not on file  Other Topics Concern   Not on file  Social History Narrative   Left-handed.   1.5 cups caffeine per day.   Lives at home with his wife.   Social Determinants of Health   Financial Resource Strain: Not on file  Food Insecurity: Low Risk  (08/18/2022)   [facility]   Hunger Vital Sign    Worried About Running Out of Food in the Last Year: Never true    Ran Out of Food in the Last Year: Never true  Transportation Needs: No Transportation Needs (08/18/2022)   [facility]   Transportation    In the past 12 months, has lack of reliable transportation kept you from medical appointments, meetings, work or from getting things needed for daily living? : No  Physical Activity: Not on file  Stress: Not on file  Social Connections: Not on file  Intimate Partner Violence: Not on file   Levert Feinstein, M.D. Ph.D.  [facility] [address] [place], [place] [address] Ph: [phone] Fax: [phone]  CC:  No referring provider defined for this encounter.  Pcp, No

## 2023-02-23 ENCOUNTER — Other Ambulatory Visit: Payer: Self-pay

## 2023-02-23 ENCOUNTER — Encounter: Payer: Self-pay | Admitting: Family Medicine

## 2023-02-23 ENCOUNTER — Telehealth: Payer: Self-pay | Admitting: Neurology

## 2023-02-23 ENCOUNTER — Ambulatory Visit (INDEPENDENT_AMBULATORY_CARE_PROVIDER_SITE_OTHER): Payer: Medicare Other | Admitting: Family Medicine

## 2023-02-23 VITALS — BP 132/84 | HR 68 | Ht 69.5 in | Wt 232.0 lb

## 2023-02-23 DIAGNOSIS — S8012XA Contusion of left lower leg, initial encounter: Secondary | ICD-10-CM

## 2023-02-23 DIAGNOSIS — M79662 Pain in left lower leg: Secondary | ICD-10-CM | POA: Diagnosis not present

## 2023-02-23 NOTE — Telephone Encounter (Signed)
Urgent orthopedic referral faxed to [facility] (fax# [phone], phone# [phone])

## 2023-02-23 NOTE — Progress Notes (Signed)
   I, Cory Mathis, CMA acting as a scribe for Cory Graham, MD.  Cory Mathis is a 72 y.o. male who presents to [facility] today for L leg pain x 1 week. Pt locates pain to left shin and top of the foot. Sx are intermittent, worse with foot flexion/dorsiflexion. Bruising present, seems worse now than initially. Currently on blood thinners and prednisone. Denies swelling, increased warmth.   Swelling: no Treatments tried: ice  Dx testing: 02/09/23 LE NCV study  Pertinent review of systems: No fevers or chills  Relevant historical information: On Xarelto anticoagulation.   Exam:  BP 132/84   Pulse 68   Ht 5' 9.5" (1.765 m)   Wt 232 lb (105.2 kg)   SpO2 95%   BMI 33.77 kg/m  General: Well Developed, well nourished, and in no acute distress.   MSK: Left lower leg mildly swelling with significant bruising in the anterior lower leg down into the dorsal foot. Mildly tender palpation anterior lower leg compartment Normal foot and ankle motion. Strength is intact foot dorsiflexion. Pulses are intact distally.  Lab and Radiology Results  Diagnostic Limited MSK Ultrasound of: Left lower leg anterior muscle compartment. Hypoechoic structure thought to be hematoma visible at anterior compartment superficial to the muscle belly. Measurement is 1.1 x 1.8 by almost 4 cm in length. Impression: Hematoma left anterior lower leg     Assessment and Plan: 72 y.o. male with patient has a hematoma in the left anterior lower leg following a nerve conduction study.  I believe he had some bleeding secondary to his blood thinner which caused the hematoma.  Thankfully I do not think he has an acute compartment syndrome.  This should resolve with conservative management including compression and continued range of motion activities.  We discussed warning signs for acute compartment syndrome.  Plan to recheck in 1 month.  If this does not resolve over time and as it  turns into a seroma he may benefit from aspiration and injection but both Cory Mathis and myself would like to avoid aspiration if possible.   PDMP not reviewed this encounter. Orders Placed This Encounter  Procedures   [place] LIMITED JOINT SPACE STRUCTURES LOW LEFT(NO LINKED CHARGES)    Order Specific Question:   Reason for Exam (SYMPTOM  OR DIAGNOSIS REQUIRED)    Answer:   left LE pain and bruising    Order Specific Question:   Preferred imaging location?    Answer:   [facility]   No orders of the defined types were placed in this encounter.    Discussed warning signs or symptoms. Please see discharge instructions. Patient expresses understanding.   The above documentation has been reviewed and is accurate and complete Cory Mathis, M.D.

## 2023-02-23 NOTE — Patient Instructions (Signed)
Thank you for coming in today.   Compression stocking to the knee.   Ok to add Body Helix full calf compression sleeve on top.   Generally wear during the day.   OK to continue normal activity as tolerated.   I think this is a hematoma and likely will turn into a seroma.   This big scary concern here is acute compartment syndrome.   Recheck in 1 month.   Let me know sooner if this is not working.     Seroma A seroma is a collection of fluid on the body that looks like swelling or a mass. Seromas form where tissue has been injured or cut. Seromas vary in size. Some are small and painless. Others may grow large and cause pain or discomfort. Many seromas go away on their own as the fluid is naturally absorbed by the body, and some seromas need to be drained. What are the causes? Seromas form because tissue has been damaged or removed. This tissue damage may happen during surgery or because of an injury or trauma. When tissue is damaged or removed, empty space is created. The body's defense system (immune system) causes fluid to enter the empty space and form a seroma. What are the signs or symptoms? Symptoms of this condition include: Swelling at the site of a surgical incision or an injury. Drainage of clear fluid at the surgery or injury site. Discomfort or pain. How is this diagnosed? This condition is diagnosed based on: Your symptoms. Your medical history. A physical exam. During the exam, your health care provider will press on the seroma. You may also have tests, such as: Blood tests. An ultrasound, a CT scan, or other imaging tests. How is this treated? Some seromas go away on their own. Your health care provider may watch the seroma to make sure it does not cause any problems. Seromas that do not go away on their own may be treated. Treatment may include: Using a needle to drain the fluid from the seroma (needle aspiration). Putting in a small, thin tube (catheter) to  drain the fluid. Putting on a bandage (dressing), such as an elastic bandage or binder. Taking antibiotics, if the seroma gets infected. In rare cases, surgery may be done to remove the seroma and repair the area. Follow these instructions at home:  If you were prescribed antibiotics, take them as told by your health care provider. Do not stop using the antibiotic even if you start to feel better. Take over-the-counter and prescription medicines only as told by your health care provider. Return to your normal activities as told by your health care provider. Ask your health care provider what activities are safe for you. Check your seroma every day for signs of infection. Check for: Redness or pain. More swelling. More fluid. Warmth. Pus or a bad smell. Keep all follow-up visits. Your health care provider needs to check that your seroma is healing. Contact a health care provider if: You have a fever. You have redness or pain at the site of your seroma. Your seroma is more swollen or is getting bigger. You have more fluid coming from your seroma. Your seroma feels warm to the touch. You have pus or a bad smell coming from your seroma. Get help right away if: You have a fever along with severe pain, redness at the site, or chills. You feel confused or have trouble staying awake. You feel like your heart is beating very fast. You feel short of  breath or are breathing fast. You have cool, clammy, or sweaty skin. Summary Seromas can form because of injury or surgery on tissue. Seromas can cause swelling, drainage of clear fluid, and discomfort or pain at the surgery or injury site. Some seromas go away on their own. Other seromas may need to be drained. Check your seroma every day for signs of infection. Signs include redness, pain, more swelling, more fluid, warmth, pus, or a bad smell. This information is not intended to replace advice given to you by your health care provider. Make  sure you discuss any questions you have with your health care provider. Document Revised: 08/15/2021 Document Reviewed: 08/15/2021 Elsevier Patient Education  2024 [employer].

## 2023-03-05 DIAGNOSIS — J3081 Allergic rhinitis due to animal (cat) (dog) hair and dander: Secondary | ICD-10-CM | POA: Diagnosis not present

## 2023-03-05 DIAGNOSIS — J3089 Other allergic rhinitis: Secondary | ICD-10-CM | POA: Diagnosis not present

## 2023-03-05 DIAGNOSIS — R3914 Feeling of incomplete bladder emptying: Secondary | ICD-10-CM | POA: Diagnosis not present

## 2023-03-05 DIAGNOSIS — N3281 Overactive bladder: Secondary | ICD-10-CM | POA: Diagnosis not present

## 2023-03-05 DIAGNOSIS — N401 Enlarged prostate with lower urinary tract symptoms: Secondary | ICD-10-CM | POA: Diagnosis not present

## 2023-03-05 DIAGNOSIS — J301 Allergic rhinitis due to pollen: Secondary | ICD-10-CM | POA: Diagnosis not present

## 2023-03-12 DIAGNOSIS — J3089 Other allergic rhinitis: Secondary | ICD-10-CM | POA: Diagnosis not present

## 2023-03-12 DIAGNOSIS — J3081 Allergic rhinitis due to animal (cat) (dog) hair and dander: Secondary | ICD-10-CM | POA: Diagnosis not present

## 2023-03-12 DIAGNOSIS — J301 Allergic rhinitis due to pollen: Secondary | ICD-10-CM | POA: Diagnosis not present

## 2023-03-19 DIAGNOSIS — J301 Allergic rhinitis due to pollen: Secondary | ICD-10-CM | POA: Diagnosis not present

## 2023-03-19 DIAGNOSIS — J3081 Allergic rhinitis due to animal (cat) (dog) hair and dander: Secondary | ICD-10-CM | POA: Diagnosis not present

## 2023-03-19 DIAGNOSIS — J3089 Other allergic rhinitis: Secondary | ICD-10-CM | POA: Diagnosis not present

## 2023-03-26 DIAGNOSIS — J301 Allergic rhinitis due to pollen: Secondary | ICD-10-CM | POA: Diagnosis not present

## 2023-03-26 DIAGNOSIS — G4700 Insomnia, unspecified: Secondary | ICD-10-CM | POA: Diagnosis not present

## 2023-03-26 DIAGNOSIS — G4733 Obstructive sleep apnea (adult) (pediatric): Secondary | ICD-10-CM | POA: Diagnosis not present

## 2023-03-26 DIAGNOSIS — J3081 Allergic rhinitis due to animal (cat) (dog) hair and dander: Secondary | ICD-10-CM | POA: Diagnosis not present

## 2023-03-26 DIAGNOSIS — J3089 Other allergic rhinitis: Secondary | ICD-10-CM | POA: Diagnosis not present

## 2023-03-27 ENCOUNTER — Ambulatory Visit (INDEPENDENT_AMBULATORY_CARE_PROVIDER_SITE_OTHER): Payer: Medicare Other | Admitting: Family Medicine

## 2023-03-27 ENCOUNTER — Other Ambulatory Visit: Payer: Self-pay

## 2023-03-27 VITALS — BP 172/74 | HR 70 | Ht 69.5 in | Wt 230.0 lb

## 2023-03-27 DIAGNOSIS — S8012XA Contusion of left lower leg, initial encounter: Secondary | ICD-10-CM

## 2023-03-27 NOTE — Progress Notes (Signed)
   Rubin Payor, PhD, LAT, ATC acting as a scribe for [name], MD.  Cory Mathis is a 72 y.o. male who presents to [facility] today for 18-month f/u L lower leg pain due to a hematoma. Pt was last seen by Dr. Denyse Amass on 02/23/23 and was advised to use compression and cont ROM activities.   Today, pt reports L lower leg pain is all better. Only slight bruising remains.   Dx testing: 02/09/23 LE NCV study   Pertinent review of systems: No fevers or chills  Relevant historical information: Anticoagulation due to atrial fibrillation.   Exam:  BP (!) 172/74   Pulse 70   Ht 5' 9.5" (1.765 m)   Wt 230 lb (104.3 kg)   SpO2 98%   BMI 33.48 kg/m  General: Well Developed, well nourished, and in no acute distress.   MSK: Left lower leg no significant swelling or bruising visible.  Nontender normal foot and ankle motion and strength.    Lab and Radiology Results  Diagnostic Limited MSK Ultrasound of: Left lower leg Isoechoic structure present left anterior medial lower leg measuring 0.7 by about 2 cm. Impression: Resolving hematoma      Assessment and Plan: 72 y.o. male with left lower leg hematoma.  This is improving compared to previous visit about a month ago.  It is about half the size it was and he is clinically feeling better.  Plan to continue compression for about another month and wean out of compression thereafter.  Check back with me as needed.   PDMP not reviewed this encounter. Orders Placed This Encounter  Procedures   [place] LIMITED JOINT SPACE STRUCTURES LOW LEFT(NO LINKED CHARGES)    Order Specific Question:   Reason for Exam (SYMPTOM  OR DIAGNOSIS REQUIRED)    Answer:   eval hematoma    Order Specific Question:   Preferred imaging location?    Answer:   [facility]   No orders of the defined types were placed in this encounter.    Discussed warning signs or symptoms. Please see discharge  instructions. Patient expresses understanding.   The above documentation has been reviewed and is accurate and complete [name], M.D.

## 2023-03-27 NOTE — Patient Instructions (Addendum)
Thank you for coming in today.   Continue compression for another month.   After that ok to wean out of the compression   Recheck as needed.

## 2023-04-03 DIAGNOSIS — R930 Abnormal findings on diagnostic imaging of skull and head, not elsewhere classified: Secondary | ICD-10-CM | POA: Diagnosis not present

## 2023-04-03 DIAGNOSIS — R791 Abnormal coagulation profile: Secondary | ICD-10-CM | POA: Diagnosis not present

## 2023-04-03 DIAGNOSIS — D6869 Other thrombophilia: Secondary | ICD-10-CM | POA: Diagnosis not present

## 2023-04-03 DIAGNOSIS — I776 Arteritis, unspecified: Secondary | ICD-10-CM | POA: Diagnosis not present

## 2023-04-03 DIAGNOSIS — D8989 Other specified disorders involving the immune mechanism, not elsewhere classified: Secondary | ICD-10-CM | POA: Diagnosis not present

## 2023-04-03 DIAGNOSIS — R7401 Elevation of levels of liver transaminase levels: Secondary | ICD-10-CM | POA: Diagnosis not present

## 2023-04-09 ENCOUNTER — Other Ambulatory Visit: Payer: Self-pay

## 2023-04-09 ENCOUNTER — Other Ambulatory Visit (HOSPITAL_COMMUNITY): Payer: Self-pay

## 2023-04-09 DIAGNOSIS — J3081 Allergic rhinitis due to animal (cat) (dog) hair and dander: Secondary | ICD-10-CM | POA: Diagnosis not present

## 2023-04-09 DIAGNOSIS — J301 Allergic rhinitis due to pollen: Secondary | ICD-10-CM | POA: Diagnosis not present

## 2023-04-09 DIAGNOSIS — J3089 Other allergic rhinitis: Secondary | ICD-10-CM | POA: Diagnosis not present

## 2023-04-09 DIAGNOSIS — I4819 Other persistent atrial fibrillation: Secondary | ICD-10-CM

## 2023-04-09 MED ORDER — RIVAROXABAN 20 MG PO TABS
20.0000 mg | ORAL_TABLET | Freq: Every day | ORAL | 1 refills | Status: DC
  Filled 2023-04-09: qty 90, 90d supply, fill #0

## 2023-04-09 NOTE — Telephone Encounter (Signed)
Prescription refill request for Xarelto received.  Indication: AF Last office visit: 11/10/22  Lovina Reach MD Weight: 106.3kg Age: 72 Scr: 1.1 on 02/21/23  Epic CrCl: 89.93  Based on above findings Xarelto 20mg  daily is the appropriate dose.  Refill approved.

## 2023-04-10 ENCOUNTER — Telehealth: Payer: Self-pay | Admitting: Internal Medicine

## 2023-04-10 MED ORDER — LOSARTAN POTASSIUM 50 MG PO TABS: 50.0000 mg | ORAL_TABLET | Freq: Every day | ORAL | 3 refills | Status: DC

## 2023-04-10 NOTE — Telephone Encounter (Signed)
Pt c/o medication issue:  1. Name of Medication:  losartan (COZAAR) 25 MG tablet   2. How are you currently taking this medication (dosage and times per day)?   3. Are you having a reaction (difficulty breathing--STAT)?   4. What is your medication issue?    Patient stated his prescription was increased to two tablets per day and wants confirm he should be taking the increased dosage.  Patient stated he will need new prescription sent to [id number] - [place], [place] - [address] AT [facility] [address].

## 2023-04-10 NOTE — Telephone Encounter (Signed)
I spoke with the pt and he reports that he has been doubling up on his Losartan 25 mg to 2 a day for about a month and his BP has been 120's/ 70's and he feels well.   Last note:   Pricilla Riffle, MD 1 hour ago (3:49 PM)  Glad to hear his BP is running better 25 mg losartan is a very low dose Can advance as needed Goal for BP 110s /120s/ very low 130s  Will send in for the 50 mg and pt to keep an eye on his BP and let us know if any changes.

## 2023-04-16 ENCOUNTER — Other Ambulatory Visit: Payer: Self-pay

## 2023-04-16 DIAGNOSIS — J3081 Allergic rhinitis due to animal (cat) (dog) hair and dander: Secondary | ICD-10-CM | POA: Diagnosis not present

## 2023-04-16 DIAGNOSIS — J3089 Other allergic rhinitis: Secondary | ICD-10-CM | POA: Diagnosis not present

## 2023-04-16 DIAGNOSIS — J301 Allergic rhinitis due to pollen: Secondary | ICD-10-CM | POA: Diagnosis not present

## 2023-04-16 DIAGNOSIS — I4819 Other persistent atrial fibrillation: Secondary | ICD-10-CM

## 2023-04-16 MED ORDER — RIVAROXABAN 20 MG PO TABS: 20.0000 mg | ORAL_TABLET | Freq: Every day | ORAL | 1 refills | Status: DC

## 2023-04-24 DIAGNOSIS — Z87891 Personal history of nicotine dependence: Secondary | ICD-10-CM | POA: Diagnosis not present

## 2023-04-24 DIAGNOSIS — Z7952 Long term (current) use of systemic steroids: Secondary | ICD-10-CM | POA: Diagnosis not present

## 2023-04-24 DIAGNOSIS — R7401 Elevation of levels of liver transaminase levels: Secondary | ICD-10-CM | POA: Diagnosis not present

## 2023-04-24 DIAGNOSIS — G939 Disorder of brain, unspecified: Secondary | ICD-10-CM | POA: Diagnosis not present

## 2023-05-04 DIAGNOSIS — J3089 Other allergic rhinitis: Secondary | ICD-10-CM | POA: Diagnosis not present

## 2023-05-04 DIAGNOSIS — J3081 Allergic rhinitis due to animal (cat) (dog) hair and dander: Secondary | ICD-10-CM | POA: Diagnosis not present

## 2023-05-04 DIAGNOSIS — J301 Allergic rhinitis due to pollen: Secondary | ICD-10-CM | POA: Diagnosis not present

## 2023-05-07 ENCOUNTER — Emergency Department (HOSPITAL_BASED_OUTPATIENT_CLINIC_OR_DEPARTMENT_OTHER): Payer: Medicare Other

## 2023-05-07 ENCOUNTER — Emergency Department (HOSPITAL_BASED_OUTPATIENT_CLINIC_OR_DEPARTMENT_OTHER)
Admission: EM | Admit: 2023-05-07 | Discharge: 2023-05-07 | Disposition: A | Discharge status: Home / Self Care | Payer: Medicare Other | Attending: Emergency Medicine | Admitting: Emergency Medicine

## 2023-05-07 ENCOUNTER — Encounter (HOSPITAL_BASED_OUTPATIENT_CLINIC_OR_DEPARTMENT_OTHER): Payer: Self-pay | Admitting: Emergency Medicine

## 2023-05-07 ENCOUNTER — Other Ambulatory Visit: Payer: Self-pay

## 2023-05-07 DIAGNOSIS — Z7901 Long term (current) use of anticoagulants: Secondary | ICD-10-CM | POA: Diagnosis not present

## 2023-05-07 DIAGNOSIS — W228XXA Striking against or struck by other objects, initial encounter: Secondary | ICD-10-CM | POA: Diagnosis not present

## 2023-05-07 DIAGNOSIS — R519 Headache, unspecified: Secondary | ICD-10-CM | POA: Diagnosis not present

## 2023-05-07 DIAGNOSIS — I2510 Atherosclerotic heart disease of native coronary artery without angina pectoris: Secondary | ICD-10-CM | POA: Insufficient documentation

## 2023-05-07 DIAGNOSIS — S0990XA Unspecified injury of head, initial encounter: Secondary | ICD-10-CM | POA: Diagnosis not present

## 2023-05-07 DIAGNOSIS — Z79899 Other long term (current) drug therapy: Secondary | ICD-10-CM | POA: Insufficient documentation

## 2023-05-07 DIAGNOSIS — I10 Essential (primary) hypertension: Secondary | ICD-10-CM | POA: Diagnosis not present

## 2023-05-07 MED ORDER — ACETAMINOPHEN 325 MG PO TABS
650.0000 mg | ORAL_TABLET | Freq: Once | ORAL | Status: AC
  Administered 2023-05-07: 650 mg via ORAL
  Filled 2023-05-07: qty 2

## 2023-05-07 NOTE — ED Provider Notes (Signed)
[place] EMERGENCY DEPARTMENT AT [facility] Provider Note   CSN: [id number] Arrival date & time: 05/07/23  8295     History  Chief Complaint  Patient presents with   Head Injury    Cory Mathis is a 72 y.o. male.   Head Injury Associated symptoms: headache   Patient presents for head injury.  Medical history includes hemorrhoids, BPH, HTN, HLD, OSA, CAD, atrial fibrillation, CVA with right hemiparesis, CNS vasculitis, peripheral neuropathy.  He is on Xarelto.  5 days ago, he was reaching into his trunk when he struck the left side of his head on the edge of the tailgate.  He did not have any significant pain that day or the following day.  Over the last 3 days, he has had a generalized headache pain, pressure behind his eyes.  He is treated his pain with Tylenol.  Currently, headache is mild.  He feels that his vision is slightly more blurry than normal bilaterally.  He denies any recent nausea.  He has continued to take Xarelto.  Last dose was this morning.  Due to his recent head injury and symptoms, he presents to the ED.     Home Medications Prior to Admission medications   Medication Sig Start Date End Date Taking? Authorizing Provider  azaTHIOprine (IMURAN) 50 MG tablet Take 100 mg by mouth daily. 05/22/22 05/22/23  [provider]  Carboxymethylcellul-Glycerin (LUBRICATING EYE DROPS OP) Place 1 drop into both eyes daily as needed (dry eyes).    [provider]  diltiazem (CARDIZEM CD) 120 MG 24 hr capsule TAKE 1 CAPSULE(120 MG) BY MOUTH TWICE DAILY 12/01/22   Pricilla Riffle, MD  EPINEPHrine 0.3 mg/0.3 mL IJ SOAJ injection Inject 0.3 mg into the muscle as needed for anaphylaxis. 08/11/20   [provider]  eszopiclone (LUNESTA) 2 MG TABS tablet Take 2 mg by mouth at bedtime. 05/26/20   [provider]  famotidine-calcium carbonate-magnesium hydroxide (PEPCID COMPLETE) 10-800-165 MG chewable tablet Chew 1 tablet by mouth at bedtime as  needed (acid reflux).    [provider]  losartan (COZAAR) 50 MG tablet Take 1 tablet (50 mg total) by mouth daily. 04/10/23   Pricilla Riffle, MD  predniSONE (DELTASONE) 10 MG tablet Take 20 mg by mouth daily.    [provider]  rivaroxaban (XARELTO) 20 MG TABS tablet Take 1 tablet (20 mg total) by mouth daily with supper. 04/16/23   Pricilla Riffle, MD  rosuvastatin (CRESTOR) 5 MG tablet TAKE 1/2 TABLET(2.5 MG) BY MOUTH DAILY 09/14/22   Pricilla Riffle, MD  Vibegron (GEMTESA) 75 MG TABS Take 75 mg by mouth at bedtime. 03/02/22   [provider]  VITAMIN D PO Take 1 tablet by mouth daily.    [provider]      Allergies    Other, Sulfa antibiotics, Ambrosia artemisiifolia (ragweed) skin test, and Flagyl [metronidazole]    Review of Systems   Review of Systems  Eyes:  Positive for visual disturbance.  Neurological:  Positive for headaches.  All other systems reviewed and are negative.   Physical Exam Updated Vital Signs BP 130/61 (BP Location: Right Arm)   Pulse (!) 55   Temp 98.7 F (37.1 C) (Oral)   Resp 16   Wt 103 kg   SpO2 94%   BMI 33.04 kg/m  Physical Exam Vitals and nursing note reviewed.  Constitutional:      General: He is not in acute distress.    Appearance:  Normal appearance. He is well-developed. He is not ill-appearing, toxic-appearing or diaphoretic.  HENT:     Head: Normocephalic and atraumatic.     Right Ear: External ear normal.     Left Ear: External ear normal.     Nose: Nose normal.     Mouth/Throat:     Mouth: Mucous membranes are moist.  Eyes:     General: No visual field deficit.    Extraocular Movements: Extraocular movements intact.     Conjunctiva/sclera: Conjunctivae normal.  Cardiovascular:     Rate and Rhythm: Normal rate and regular rhythm.  Pulmonary:     Effort: Pulmonary effort is normal. No respiratory distress.  Abdominal:     General: There is no distension.     Palpations: Abdomen is soft.      Tenderness: There is no abdominal tenderness.  Musculoskeletal:        General: No swelling. Normal range of motion.     Cervical back: Normal range of motion and neck supple.  Skin:    General: Skin is warm and dry.     Coloration: Skin is not jaundiced or pale.  Neurological:     General: No focal deficit present.     Mental Status: He is alert and oriented to person, place, and time.     Cranial Nerves: Cranial nerves 2-12 are intact. No cranial nerve deficit, dysarthria or facial asymmetry.     Sensory: Sensation is intact. No sensory deficit.     Motor: Motor function is intact. No abnormal muscle tone or pronator drift.     Coordination: Coordination is intact. Finger-Nose-Finger Test normal.  Psychiatric:        Mood and Affect: Mood normal.        Behavior: Behavior normal.     ED Results / Procedures / Treatments   Labs (all labs ordered are listed, but only abnormal results are displayed) Labs Reviewed - No data to display  EKG None  Radiology CT Head Wo Contrast  Result Date: 05/07/2023 CLINICAL DATA:  Head trauma, minor. Headache. Left-sided head injury. On blood thinners. Blurred vision right eye. EXAM: CT HEAD WITHOUT CONTRAST TECHNIQUE: Contiguous axial images were obtained from the base of the skull through the vertex without intravenous contrast. RADIATION DOSE REDUCTION: This exam was performed according to the departmental dose-optimization program which includes automated exposure control, adjustment of the mA and/or kV according to patient size and/or use of iterative reconstruction technique. COMPARISON:  Head CT 05/24/2021. Prior brain MRI examinations 01/07/2021 and earlier. FINDINGS: Brain: Mild generalized cerebral volume loss. 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 Please note, the abnormality within the left frontal lobe and within/about the left basal ganglia described on the prior brain MRI of 01/07/2021 is poorly reassessed on this  non-contrast head CT. There is no acute intracranial hemorrhage. No demarcated cortical infarct. No extra-axial fluid collection. No midline shift. Vascular: No hyperdense vessel.  Atherosclerotic calcifications. Skull: Burr hole within the left frontal calvarium. No acute calvarial fracture or aggressive osseous lesion. Sinuses/Orbits: No mass or acute finding within the imaged orbits. No significant paranasal sinus disease at the imaged levels. IMPRESSION: 1. No acute post-traumatic intracranial findings. 2. 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 Please note, the abnormality within the left frontal lobe and within/about the left basal ganglia described on the prior brain MRI of 01/07/2021 is poorly reassessed on this non-contrast head CT. 3. Mild generalized cerebral atrophy. Electronically Signed   By: Ronaldo Miyamoto  Renette Butters D.O.   On: 05/07/2023 11:29    Procedures Procedures    Medications Ordered in ED Medications  acetaminophen (TYLENOL) tablet 650 mg (650 mg Oral Given 05/07/23 1028)    ED Course/ Medical Decision Making/ A&P                                 Medical Decision Making Amount and/or Complexity of Data Reviewed Radiology: ordered.  Risk OTC drugs.   Patient presenting for recent head injury, on Xarelto.  He struck the right side of his head on the tailgate of his vehicle 5 days ago.  Over the past 3 days, he has a generalized headache, pressure behind his eyes, and mild increased blurriness bilaterally.  On arrival in the ED, vital signs notable for moderate hypertension.  He is well-appearing on exam.  He has no focal neurologic deficits.  He has no pain with extraocular movements.  Pain at this time is minimal.  Tylenol was ordered for analgesia.  Symptoms are consistent with concussion.  Given his use of blood thinners, will obtain CT scan of head.  CT showed no acute findings.  Patient was discharged in good condition.        Final Clinical Impression(s) /  ED Diagnoses Final diagnoses:  Injury of head, initial encounter    Rx / DC Orders ED Discharge Orders     None         Gloris Manchester, MD 05/07/23 1224

## 2023-05-07 NOTE — ED Notes (Signed)
Discharge paperwork given and verbally understood. 

## 2023-05-07 NOTE — Discharge Instructions (Addendum)
Take Tylenol as needed for pain.  Avoid activities that worsen your headache symptoms.  Return to the emergency department for any new or worsening symptoms of concern.

## 2023-05-07 NOTE — ED Triage Notes (Signed)
Pt reports head injured on truck of car x 5 days pta. Denies bleeding or dizziness. Reports LT side injury, c/o HA. Pt takes thinners. Reports RT eye blurred vision. GCS 15

## 2023-05-09 DIAGNOSIS — D2262 Melanocytic nevi of left upper limb, including shoulder: Secondary | ICD-10-CM | POA: Diagnosis not present

## 2023-05-09 DIAGNOSIS — L309 Dermatitis, unspecified: Secondary | ICD-10-CM | POA: Diagnosis not present

## 2023-05-09 DIAGNOSIS — C4441 Basal cell carcinoma of skin of scalp and neck: Secondary | ICD-10-CM | POA: Diagnosis not present

## 2023-05-09 DIAGNOSIS — D692 Other nonthrombocytopenic purpura: Secondary | ICD-10-CM | POA: Diagnosis not present

## 2023-05-09 DIAGNOSIS — L820 Inflamed seborrheic keratosis: Secondary | ICD-10-CM | POA: Diagnosis not present

## 2023-05-09 DIAGNOSIS — L821 Other seborrheic keratosis: Secondary | ICD-10-CM | POA: Diagnosis not present

## 2023-05-09 DIAGNOSIS — D1801 Hemangioma of skin and subcutaneous tissue: Secondary | ICD-10-CM | POA: Diagnosis not present

## 2023-05-09 DIAGNOSIS — D2261 Melanocytic nevi of right upper limb, including shoulder: Secondary | ICD-10-CM | POA: Diagnosis not present

## 2023-05-15 DIAGNOSIS — J301 Allergic rhinitis due to pollen: Secondary | ICD-10-CM | POA: Diagnosis not present

## 2023-05-15 DIAGNOSIS — J3089 Other allergic rhinitis: Secondary | ICD-10-CM | POA: Diagnosis not present

## 2023-05-15 DIAGNOSIS — J3081 Allergic rhinitis due to animal (cat) (dog) hair and dander: Secondary | ICD-10-CM | POA: Diagnosis not present

## 2023-05-21 DIAGNOSIS — J3089 Other allergic rhinitis: Secondary | ICD-10-CM | POA: Diagnosis not present

## 2023-05-21 DIAGNOSIS — J301 Allergic rhinitis due to pollen: Secondary | ICD-10-CM | POA: Diagnosis not present

## 2023-05-21 DIAGNOSIS — J3081 Allergic rhinitis due to animal (cat) (dog) hair and dander: Secondary | ICD-10-CM | POA: Diagnosis not present

## 2023-05-23 DIAGNOSIS — U071 COVID-19: Secondary | ICD-10-CM | POA: Diagnosis not present

## 2023-06-04 DIAGNOSIS — J3081 Allergic rhinitis due to animal (cat) (dog) hair and dander: Secondary | ICD-10-CM | POA: Diagnosis not present

## 2023-06-04 DIAGNOSIS — J3089 Other allergic rhinitis: Secondary | ICD-10-CM | POA: Diagnosis not present

## 2023-06-04 DIAGNOSIS — J301 Allergic rhinitis due to pollen: Secondary | ICD-10-CM | POA: Diagnosis not present

## 2023-06-08 ENCOUNTER — Telehealth: Payer: Self-pay | Admitting: Podiatry

## 2023-06-11 DIAGNOSIS — J301 Allergic rhinitis due to pollen: Secondary | ICD-10-CM | POA: Diagnosis not present

## 2023-06-11 DIAGNOSIS — J3089 Other allergic rhinitis: Secondary | ICD-10-CM | POA: Diagnosis not present

## 2023-06-11 DIAGNOSIS — J3081 Allergic rhinitis due to animal (cat) (dog) hair and dander: Secondary | ICD-10-CM | POA: Diagnosis not present

## 2023-06-18 DIAGNOSIS — J301 Allergic rhinitis due to pollen: Secondary | ICD-10-CM | POA: Diagnosis not present

## 2023-06-18 DIAGNOSIS — J3081 Allergic rhinitis due to animal (cat) (dog) hair and dander: Secondary | ICD-10-CM | POA: Diagnosis not present

## 2023-06-18 DIAGNOSIS — J3089 Other allergic rhinitis: Secondary | ICD-10-CM | POA: Diagnosis not present

## 2023-06-20 ENCOUNTER — Ambulatory Visit (HOSPITAL_COMMUNITY)
Admission: RE | Admit: 2023-06-20 | Discharge: 2023-06-20 | Disposition: A | Discharge status: Home / Self Care | Payer: Medicare Other | Source: Ambulatory Visit | Attending: Physician Assistant | Admitting: Physician Assistant

## 2023-06-20 ENCOUNTER — Encounter (HOSPITAL_COMMUNITY): Payer: Self-pay | Admitting: Physician Assistant

## 2023-06-20 VITALS — BP 136/80 | HR 56 | Ht 69.5 in | Wt 231.0 lb

## 2023-06-20 DIAGNOSIS — D6869 Other thrombophilia: Secondary | ICD-10-CM | POA: Diagnosis not present

## 2023-06-20 DIAGNOSIS — Z7901 Long term (current) use of anticoagulants: Secondary | ICD-10-CM | POA: Insufficient documentation

## 2023-06-20 DIAGNOSIS — I4819 Other persistent atrial fibrillation: Secondary | ICD-10-CM | POA: Diagnosis not present

## 2023-06-20 DIAGNOSIS — Z01810 Encounter for preprocedural cardiovascular examination: Secondary | ICD-10-CM | POA: Diagnosis not present

## 2023-06-20 DIAGNOSIS — G4733 Obstructive sleep apnea (adult) (pediatric): Secondary | ICD-10-CM | POA: Insufficient documentation

## 2023-06-20 DIAGNOSIS — I2510 Atherosclerotic heart disease of native coronary artery without angina pectoris: Secondary | ICD-10-CM | POA: Insufficient documentation

## 2023-06-20 DIAGNOSIS — Z79899 Other long term (current) drug therapy: Secondary | ICD-10-CM | POA: Insufficient documentation

## 2023-06-20 DIAGNOSIS — I10 Essential (primary) hypertension: Secondary | ICD-10-CM | POA: Insufficient documentation

## 2023-06-20 NOTE — Progress Notes (Signed)
 Primary Care Physician: Dayna Motto, DO Referring Physician: Jodie Passey, PA Cardiologist: Dr. Okey  Primary EP: Dr Cory Mathis is a 73 y.o. male with a h/o persistent afib, bradycardia, HTN, OSA, treated with cpap, that is in the afib clinic s/p successful  cardioversion 02/08/21, scheduled by Jodie Passey.   EKG shows sinus brady at 54 bpm, not symptomatic with this.  Per [name]'s note, he preferred not to be on AAD's and would prefer ablation in the future if needed. He was not aware of the afib, just noted higher HR's on his apple watch and requested appointment with  Dr. Okey. He is already scheduled with Dr. Cindie 10/27. He had a basically normal echo in 2020 and is scheduled for a updated echo. He denies alcohol use, no tobacco, some caffeine, not excessive.    Follow up in the AF clinic 10/24/21. Patient reports that about 5 days ago he noted elevated heart rates on his Apple Watch and symptoms of sluggishness. He is in afib today with elevated heart rates. He has recently been treated for a respiratory infection with abx and increased prednisone .   F/u in afib clinic, 02/01/22,  as pt has developed persistent afib for the last 2 days associated with fatigue and lightheadedness. 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 He is now down 7.5 mg daily. No known triggers for afib. He saw Dr. Cindie in March and due to low afib burden, it was a wait and watch approach. He had afib in May, set up for cardioversion but spontaneously converted and now again in August. No missed xarelto .   Follow up in the AF clinic 05/25/22. Patient reports that one week ago he started having symptoms of fatigue and SOB. His smart watch has shown persistent afib since then. He did have an alcoholic drink just prior to the onset of his symptoms. No bleeding issues on anticoagulation.   Follow up in the AF clinic  06/15/22. Patient is s/p DCCV 06/01/22. He is back in SR with resolution of his symptoms.   Follow up in the AF clinic 06/20/23. Patient reports that he has done well since his last visit. He denies any interim symptoms of afib. He is concerned about his bleeding risk on anticoagulation.   Today, he denies symptoms of palpitations, chest pain, orthopnea, PND, lower extremity edema, dizziness, presyncope, syncope, or neurologic sequela. The patient is tolerating medications without difficulties and is otherwise without complaint today.   Past Medical History:  Diagnosis Date   Allergic rhinitis    Allergy    Atrial fibrillation with rapid ventricular response (HCC)    Benign neoplasm of right choroid    BPH (benign prostatic hyperplasia)    Brain tumor (benign) (HCC)    Cataract    bilateral,removed   Chest pain    Chronic headaches    Constipation    Diverticulosis of colon    Dysplastic nevus    Dysrhythmia 2019   Afib   Fatigue    Gastroesophageal reflux disease without esophagitis    GERD (gastroesophageal reflux disease)    prn med. 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     diltiazem  (CARDIZEM  CD) 120 MG 24 hr capsule TAKE 1 CAPSULE(120 MG) BY MOUTH TWICE DAILY 180 capsule 3   EPINEPHrine  0.3 mg/0.3 mL IJ SOAJ injection Inject 0.3 mg into the muscle as needed for anaphylaxis.     eszopiclone (LUNESTA) 2 MG TABS tablet Take 2 mg by mouth at bedtime.     famotidine-calcium  carbonate-magnesium hydroxide (PEPCID COMPLETE) 10-800-165 MG chewable tablet Chew 1 tablet by mouth at bedtime as needed (acid reflux).     losartan  (COZAAR ) 50 MG tablet Take 1 tablet (50 mg total) by mouth daily. (Patient taking differently: Take 50 mg by mouth 2 (two) times daily.) 90 tablet 3   predniSONE  (DELTASONE ) 10 MG tablet Take 20 mg by mouth daily.     rivaroxaban  (XARELTO ) 20 MG TABS tablet Take 1 tablet (20 mg total) by mouth daily with supper. 90 tablet 1   rosuvastatin  (CRESTOR ) 5 MG tablet TAKE 1/2 TABLET(2.5 MG) BY MOUTH DAILY 45 tablet 3   Vibegron (GEMTESA) 75 MG TABS Take 75 mg by mouth at bedtime.     VITAMIN D PO Take 1 tablet by mouth daily.     No current facility-administered medications for this encounter.    ROS- All systems are reviewed and negative except as per the HPI above  Physical Exam: Vitals:   06/20/23 1501  BP: 136/80  Pulse: (!) 56  Weight: 104.8 kg  Height: 5' 9.5 (1.765 m)     Wt Readings from Last 3 Encounters:  06/20/23 104.8 kg  05/07/23 103 kg  03/27/23 104.3 kg    GEN: Well nourished, well developed in no acute distress NECK: No JVD; No carotid bruits CARDIAC: Regular rate and rhythm, no murmurs, rubs, gallops RESPIRATORY:  Clear to auscultation without rales, wheezing or rhonchi  ABDOMEN: Soft, non-tender, non-distended EXTREMITIES:  No edema; No deformity    EKG today demonstrates SB, RBBB Vent. rate 56 BPM PR interval 182 ms QRS duration 134 ms QT/QTcB 438/422  ms   CHA2DS2-VASc Score = 3  The patient's score is based upon: CHF History: 0 HTN History: 1 Diabetes History: 0 Stroke History: 0 Vascular Disease History: 1 Age Score: 1 Gender Score: 0         ASSESSMENT AND PLAN: Persistent Atrial Fibrillation (ICD10:  I48.19) The patient's CHA2DS2-VASc score is 3, indicating a 3.2% annual risk of stroke.   Patient appears to be maintaining SR Continue  diltiazem  120 mg BID  Continue Xarelto  20 mg daily Apple Watch for home monitoring.   Secondary Hypercoagulable State (ICD10:  D4709188) The patient is  at significant risk for stroke/thromboembolism based upon his CHA2DS2-VASc Score of 3.  Continue Rivaroxaban  (Xarelto ). Patient is concerned about his bleeding risk with his easy bruising. Watchman brochure given.   HTN Stable on current regimen  CAD CAC score 186 No anginal symptoms.   Follow up in the AF clinic in one year.    Daril Kicks PA-C [facility] [address] [place], [place] [address] [phone]

## 2023-06-25 ENCOUNTER — Telehealth: Payer: Self-pay

## 2023-06-25 DIAGNOSIS — J301 Allergic rhinitis due to pollen: Secondary | ICD-10-CM | POA: Diagnosis not present

## 2023-06-25 DIAGNOSIS — J3081 Allergic rhinitis due to animal (cat) (dog) hair and dander: Secondary | ICD-10-CM | POA: Diagnosis not present

## 2023-06-25 DIAGNOSIS — J3089 Other allergic rhinitis: Secondary | ICD-10-CM | POA: Diagnosis not present

## 2023-06-25 NOTE — Telephone Encounter (Signed)
 Per Dr. Tenny Craw and [name], scheduled the patient for Watchman consult 07/26/2023. He was grateful for call and agreed with plan.

## 2023-06-28 DIAGNOSIS — C44319 Basal cell carcinoma of skin of other parts of face: Secondary | ICD-10-CM | POA: Diagnosis not present

## 2023-06-28 DIAGNOSIS — Z85828 Personal history of other malignant neoplasm of skin: Secondary | ICD-10-CM | POA: Diagnosis not present

## 2023-07-03 DIAGNOSIS — Z7952 Long term (current) use of systemic steroids: Secondary | ICD-10-CM | POA: Diagnosis not present

## 2023-07-03 DIAGNOSIS — M359 Systemic involvement of connective tissue, unspecified: Secondary | ICD-10-CM | POA: Diagnosis not present

## 2023-07-03 DIAGNOSIS — I776 Arteritis, unspecified: Secondary | ICD-10-CM | POA: Diagnosis not present

## 2023-07-03 DIAGNOSIS — Z79899 Other long term (current) drug therapy: Secondary | ICD-10-CM | POA: Diagnosis not present

## 2023-07-03 DIAGNOSIS — R42 Dizziness and giddiness: Secondary | ICD-10-CM | POA: Diagnosis not present

## 2023-07-03 DIAGNOSIS — Z79620 Long term (current) use of immunosuppressive biologic: Secondary | ICD-10-CM | POA: Diagnosis not present

## 2023-07-03 DIAGNOSIS — Z79624 Long term (current) use of inhibitors of nucleotide synthesis: Secondary | ICD-10-CM | POA: Diagnosis not present

## 2023-07-10 DIAGNOSIS — Z6834 Body mass index (BMI) 34.0-34.9, adult: Secondary | ICD-10-CM | POA: Diagnosis not present

## 2023-07-10 DIAGNOSIS — M545 Low back pain, unspecified: Secondary | ICD-10-CM | POA: Diagnosis not present

## 2023-07-11 DIAGNOSIS — J3089 Other allergic rhinitis: Secondary | ICD-10-CM | POA: Diagnosis not present

## 2023-07-11 DIAGNOSIS — J3081 Allergic rhinitis due to animal (cat) (dog) hair and dander: Secondary | ICD-10-CM | POA: Diagnosis not present

## 2023-07-11 DIAGNOSIS — J301 Allergic rhinitis due to pollen: Secondary | ICD-10-CM | POA: Diagnosis not present

## 2023-07-16 DIAGNOSIS — I776 Arteritis, unspecified: Secondary | ICD-10-CM | POA: Diagnosis not present

## 2023-07-16 DIAGNOSIS — K760 Fatty (change of) liver, not elsewhere classified: Secondary | ICD-10-CM | POA: Diagnosis not present

## 2023-07-16 DIAGNOSIS — R7989 Other specified abnormal findings of blood chemistry: Secondary | ICD-10-CM | POA: Diagnosis not present

## 2023-07-16 DIAGNOSIS — I6602 Occlusion and stenosis of left middle cerebral artery: Secondary | ICD-10-CM | POA: Diagnosis not present

## 2023-07-16 DIAGNOSIS — R7401 Elevation of levels of liver transaminase levels: Secondary | ICD-10-CM | POA: Diagnosis not present

## 2023-07-16 DIAGNOSIS — I6613 Occlusion and stenosis of bilateral anterior cerebral arteries: Secondary | ICD-10-CM | POA: Diagnosis not present

## 2023-07-16 DIAGNOSIS — R930 Abnormal findings on diagnostic imaging of skull and head, not elsewhere classified: Secondary | ICD-10-CM | POA: Diagnosis not present

## 2023-07-19 DIAGNOSIS — Z131 Encounter for screening for diabetes mellitus: Secondary | ICD-10-CM | POA: Diagnosis not present

## 2023-07-19 DIAGNOSIS — I10 Essential (primary) hypertension: Secondary | ICD-10-CM | POA: Diagnosis not present

## 2023-07-19 DIAGNOSIS — R3589 Other polyuria: Secondary | ICD-10-CM | POA: Diagnosis not present

## 2023-07-19 DIAGNOSIS — K760 Fatty (change of) liver, not elsewhere classified: Secondary | ICD-10-CM | POA: Diagnosis not present

## 2023-07-19 DIAGNOSIS — R350 Frequency of micturition: Secondary | ICD-10-CM | POA: Diagnosis not present

## 2023-07-26 ENCOUNTER — Ambulatory Visit: Payer: Medicare Other | Attending: Cardiology | Admitting: Cardiology

## 2023-07-26 ENCOUNTER — Other Ambulatory Visit: Payer: Self-pay

## 2023-07-26 ENCOUNTER — Encounter: Payer: Self-pay | Admitting: Cardiology

## 2023-07-26 VITALS — BP 128/68 | HR 57 | Ht 70.5 in | Wt 230.6 lb

## 2023-07-26 DIAGNOSIS — Z7901 Long term (current) use of anticoagulants: Secondary | ICD-10-CM

## 2023-07-26 DIAGNOSIS — I4819 Other persistent atrial fibrillation: Secondary | ICD-10-CM

## 2023-07-26 NOTE — Progress Notes (Signed)
Electrophysiology Office Note:    Date:  07/26/2023   ID:  Cory Mathis, DOB May 22, 1951, MRN [id number]  [facility] Cardiologist:  Dietrich Pates, MD  [facility] Electrophysiologist:  Lanier Prude, MD   Referring MD: Cory Poling, DO   Chief Complaint: AF  History of Present Illness:    Cory Mathis is a 73 year old man who I am seeing today for an evaluation of atrial fibrillation.  The patient was most recently seen by [facility] in the A-fib clinic June 20, 2023.  The patient is followed by Dr. Tenny Craw in the outpatient setting.  He has a history of persistent atrial fibrillation, bradycardia, hypertension, sleep apnea on CPAP.  He also has a history of seizures, and left frontal brain mass.  I saw the patient in March 2023.  He has had a cardioversion in December 2023.  At the last appoint with Cory Mathis he expressed concern about being on long-term anticoagulation given the risks of bleeding.  He was interested in [place] implant.  He is here with his wife in clinic to have previously met.  They are very interested in pursuing left atrial appendage occlusion.  He wants to discontinue anticoagulation given the long-term bleeding risks.  He is currently taking Xarelto 20 mg by mouth daily.    Their past medical, social and family history was reviewed.   ROS:   Please see the history of present illness.    All other systems reviewed and are negative.  EKGs/Labs/Other Studies Reviewed:    The following studies were reviewed today:  February 23, 2021 echo EF 55-60 RV function normal Trivial MR       Physical Exam:    VS:  BP 128/68   Pulse (!) 57   Ht 5' 10.5" (1.791 m)   Wt 230 lb 9.6 oz (104.6 kg)   SpO2 97%   BMI 32.62 kg/m     Wt Readings from Last 3 Encounters:  07/26/23 230 lb 9.6 oz (104.6 kg)  06/20/23 231 lb (104.8 kg)  05/07/23 227 lb (103 kg)     GEN: no distress CARD: RRR, No MRG RESP: No IWOB. CTAB.        ASSESSMENT AND PLAN:    1.  Persistent atrial fibrillation (HCC)   2. Long term (current) use of anticoagulants     #Atrial fibrillation Currently on Xarelto for stroke prophylaxis but is concerned about the long-term bleeding risk associated with this stroke risk mitigation strategy.  He is interested in Landscape architect which I think is reasonable.  I have seen Cory Mathis in the office today who is being considered for a Watchman left atrial appendage closure device. I believe they will benefit from this procedure given their history of atrial fibrillation, CHA2DS2-VASc score of 3 and unadjusted ischemic stroke rate of 3.2% per year. The patient's chart has been reviewed and I feel that they would be a candidate for short term oral anticoagulation after Watchman implant.   It is my belief that after undergoing a LAA closure procedure, Cory Mathis will not need long term anticoagulation which eliminates anticoagulation side effects and major bleeding risk.   Procedural risks for the Watchman implant have been reviewed with the patient including a 0.5% risk of stroke, <1% risk of perforation and <1% risk of device embolization. Other risks include bleeding, vascular damage, tamponade, worsening renal function, and death. The patient understands these risk and wishes to proceed.     The published clinical data on the safety and  effectiveness of WATCHMAN include but are not limited to the following: - Holmes DR, Everlene Farrier, Sick P et al. for the PROTECT AF Investigators. Percutaneous closure of the left atrial appendage versus warfarin therapy for prevention of stroke in patients with atrial fibrillation: a randomised non-inferiority trial. Lancet 2009; 374: 534-42. Everlene Farrier, Doshi SK, Isa Rankin D et al. on behalf of the PROTECT AF Investigators. Percutaneous Left Atrial Appendage Closure for Stroke Prophylaxis in Patients With Atrial Fibrillation 2.3-Year Follow-up of the PROTECT AF (Watchman Left Atrial Appendage  System for Embolic Protection in Patients With Atrial Fibrillation) Trial. Circulation 2013; 127:720-729. - Alli O, Doshi S,  Kar S, Reddy VY, Sievert H et al. Quality of Life Assessment in the Randomized PROTECT AF (Percutaneous Closure of the Left Atrial Appendage Versus Warfarin Therapy for Prevention of Stroke in Patients With Atrial Fibrillation) Trial of Patients at Risk for Stroke With Nonvalvular Atrial Fibrillation. J Am Coll Cardiol 2013; 61:1790-8. Aline August DR, Mia Creek, Price M, Whisenant B, Sievert H, Doshi S, Huber K, Reddy V. Prospective randomized evaluation of the Watchman left atrial appendage Device in patients with atrial fibrillation versus long-term warfarin therapy; the PREVAIL trial. Journal of the [employer] of Cardiology, Vol. 4, No. 1, 2014, 1-11. - Kar S, Doshi SK, Sadhu A, Horton R, Osorio J et al. Primary outcome evaluation of a next-generation left atrial appendage closure device: results from the PINNACLE FLX trial. Circulation 2021;143(18)1754-1762.    After today's visit with the patient which was dedicated solely for shared decision making visit regarding LAA closure device, the patient decided to proceed with the LAA appendage closure procedure scheduled to be done in the near future at [facility]. Prior to the procedure, I would like to obtain a gated CT scan of the chest with contrast timed for PV/LA visualization.   Additionally, the patient will need an updated echo.  HAS-BLED score 2 Hypertension Yes  Abnormal renal and liver function (Dialysis, transplant, Cr >2.26 mg/dL /Cirrhosis or Bilirubin >2x Normal or AST/ALT/AP >3x Normal) No  Stroke No  Bleeding No  Labile INR (Unstable/high INR) No  Elderly (>65) Yes  Drugs or alcohol (>= 8 drinks/week, anti-plt or NSAID) No   CHA2DS2-VASc Score = 3  The patient's score is based upon: CHF History: 0 HTN History: 1 Diabetes History: 0 Stroke History: 0 Vascular Disease History: 1 Age Score:  1 Gender Score: 0      Signed, Ahmira Boisselle T. Lalla Brothers, MD, [facility], [facility] 07/26/2023 11:57 AM    Electrophysiology [facility]

## 2023-07-26 NOTE — Patient Instructions (Signed)
Medication Instructions:  Your physician recommends that you continue on your current medications as directed. Please refer to the Current Medication list given to you today.  *If you need a refill on your cardiac medications before your next appointment, please call your pharmacy*  Lab Work: BMET - please go to any [facility] location to have this drawn prior to your CT scan  Testing/Procedures: Echocardiogram  Your physician has requested that you have an echocardiogram. Echocardiography is a painless test that uses sound waves to create images of your heart. It provides your doctor with information about the size and shape of your heart and how well your heart's chambers and valves are working. This procedure takes approximately one hour. There are no restrictions for this procedure. Please do NOT wear cologne, perfume, aftershave, or lotions (deodorant is allowed). Please arrive 15 minutes prior to your appointment time.  Cardiac CT  Your physician has requested that you have cardiac CT. Cardiac computed tomography (CT) is a painless test that uses an x-ray machine to take clear, detailed pictures of your heart. For further information please visit [email or web address] Please follow instruction sheet as given.  Watchman Your physician has requested that you have Left atrial appendage (LAA) closure device implantation is a procedure to put a small device in the LAA of the heart. The LAA is a small sac in the wall of the heart's left upper chamber. Blood clots can form in this area. The device, Watchman closes the LAA to help prevent a blood clot and stroke.  You will be contacted by Nurse Navigator, Karsten Fells to schedule your pre-procedure visit and procedure date. If you have any questions she can be reached at [phone].   Follow-Up: At [facility], you and your health needs are our priority.  As part of our continuing mission to provide you with exceptional heart care, we have  created designated Provider Care Teams.  These Care Teams include your primary Cardiologist (physician) and Advanced Practice Providers (APPs -  Physician Assistants and Nurse Practitioners) who all work together to provide you with the care you need, when you need it.

## 2023-07-30 DIAGNOSIS — J3081 Allergic rhinitis due to animal (cat) (dog) hair and dander: Secondary | ICD-10-CM | POA: Diagnosis not present

## 2023-07-30 DIAGNOSIS — J301 Allergic rhinitis due to pollen: Secondary | ICD-10-CM | POA: Diagnosis not present

## 2023-07-30 DIAGNOSIS — J3089 Other allergic rhinitis: Secondary | ICD-10-CM | POA: Diagnosis not present

## 2023-08-06 DIAGNOSIS — I4819 Other persistent atrial fibrillation: Secondary | ICD-10-CM | POA: Diagnosis not present

## 2023-08-06 DIAGNOSIS — Z7901 Long term (current) use of anticoagulants: Secondary | ICD-10-CM | POA: Diagnosis not present

## 2023-08-07 LAB — BASIC METABOLIC PANEL
BUN/Creatinine Ratio: 13 (ref 10–24)
BUN: 12 mg/dL (ref 8–27)
CO2: 22 mmol/L (ref 20–29)
Calcium: 9.9 mg/dL (ref 8.6–10.2)
Chloride: 104 mmol/L (ref 96–106)
Creatinine, Ser: 0.96 mg/dL (ref 0.76–1.27)
Glucose: 92 mg/dL (ref 70–99)
Potassium: 4.8 mmol/L (ref 3.5–5.2)
Sodium: 142 mmol/L (ref 134–144)
eGFR: 83 mL/min/{1.73_m2} (ref >59)

## 2023-08-13 DIAGNOSIS — R059 Cough, unspecified: Secondary | ICD-10-CM | POA: Diagnosis not present

## 2023-08-16 ENCOUNTER — Ambulatory Visit (HOSPITAL_COMMUNITY): Payer: Medicare Other

## 2023-08-16 DIAGNOSIS — I4819 Other persistent atrial fibrillation: Secondary | ICD-10-CM | POA: Insufficient documentation

## 2023-08-16 DIAGNOSIS — Z7901 Long term (current) use of anticoagulants: Secondary | ICD-10-CM | POA: Diagnosis not present

## 2023-08-16 LAB — ECHOCARDIOGRAM COMPLETE
Area-P 1/2: 3.02 cm2
P 1/2 time: 592 ms
S' Lateral: 2.7 cm

## 2023-08-17 DIAGNOSIS — J3089 Other allergic rhinitis: Secondary | ICD-10-CM | POA: Diagnosis not present

## 2023-08-17 DIAGNOSIS — J3081 Allergic rhinitis due to animal (cat) (dog) hair and dander: Secondary | ICD-10-CM | POA: Diagnosis not present

## 2023-08-17 DIAGNOSIS — J301 Allergic rhinitis due to pollen: Secondary | ICD-10-CM | POA: Diagnosis not present

## 2023-08-21 DIAGNOSIS — G0481 Other encephalitis and encephalomyelitis: Secondary | ICD-10-CM | POA: Diagnosis not present

## 2023-08-21 DIAGNOSIS — Z79899 Other long term (current) drug therapy: Secondary | ICD-10-CM | POA: Diagnosis not present

## 2023-08-21 DIAGNOSIS — D8989 Other specified disorders involving the immune mechanism, not elsewhere classified: Secondary | ICD-10-CM | POA: Diagnosis not present

## 2023-08-21 DIAGNOSIS — I776 Arteritis, unspecified: Secondary | ICD-10-CM | POA: Diagnosis not present

## 2023-08-23 ENCOUNTER — Encounter: Payer: Self-pay | Admitting: Cardiology

## 2023-08-23 DIAGNOSIS — I776 Arteritis, unspecified: Secondary | ICD-10-CM | POA: Diagnosis not present

## 2023-08-23 DIAGNOSIS — I6789 Other cerebrovascular disease: Secondary | ICD-10-CM | POA: Diagnosis not present

## 2023-08-26 ENCOUNTER — Other Ambulatory Visit: Payer: Self-pay | Admitting: Internal Medicine

## 2023-09-03 DIAGNOSIS — N429 Disorder of prostate, unspecified: Secondary | ICD-10-CM | POA: Diagnosis not present

## 2023-09-03 DIAGNOSIS — N401 Enlarged prostate with lower urinary tract symptoms: Secondary | ICD-10-CM | POA: Diagnosis not present

## 2023-09-03 DIAGNOSIS — R3914 Feeling of incomplete bladder emptying: Secondary | ICD-10-CM | POA: Diagnosis not present

## 2023-09-03 DIAGNOSIS — N5201 Erectile dysfunction due to arterial insufficiency: Secondary | ICD-10-CM | POA: Diagnosis not present

## 2023-09-04 DIAGNOSIS — I776 Arteritis, unspecified: Secondary | ICD-10-CM | POA: Diagnosis not present

## 2023-09-04 DIAGNOSIS — Z79899 Other long term (current) drug therapy: Secondary | ICD-10-CM | POA: Diagnosis not present

## 2023-09-04 DIAGNOSIS — D8989 Other specified disorders involving the immune mechanism, not elsewhere classified: Secondary | ICD-10-CM | POA: Diagnosis not present

## 2023-09-04 DIAGNOSIS — G0481 Other encephalitis and encephalomyelitis: Secondary | ICD-10-CM | POA: Diagnosis not present

## 2023-09-05 DIAGNOSIS — J301 Allergic rhinitis due to pollen: Secondary | ICD-10-CM | POA: Diagnosis not present

## 2023-09-05 DIAGNOSIS — J3089 Other allergic rhinitis: Secondary | ICD-10-CM | POA: Diagnosis not present

## 2023-09-05 DIAGNOSIS — J3081 Allergic rhinitis due to animal (cat) (dog) hair and dander: Secondary | ICD-10-CM | POA: Diagnosis not present

## 2023-09-07 ENCOUNTER — Other Ambulatory Visit: Payer: Self-pay | Admitting: Urology

## 2023-09-07 DIAGNOSIS — N429 Disorder of prostate, unspecified: Secondary | ICD-10-CM

## 2023-09-10 ENCOUNTER — Ambulatory Visit (HOSPITAL_COMMUNITY)
Admission: RE | Admit: 2023-09-10 | Discharge: 2023-09-10 | Disposition: A | Discharge status: Home / Self Care | Source: Ambulatory Visit | Attending: Family Medicine | Admitting: Family Medicine

## 2023-09-10 DIAGNOSIS — Z7901 Long term (current) use of anticoagulants: Secondary | ICD-10-CM | POA: Insufficient documentation

## 2023-09-10 DIAGNOSIS — I4819 Other persistent atrial fibrillation: Secondary | ICD-10-CM | POA: Insufficient documentation

## 2023-09-10 MED ORDER — IOHEXOL 350 MG/ML SOLN
95.0000 mL | Freq: Once | INTRAVENOUS | Status: AC | PRN
  Administered 2023-09-10: 95 mL via INTRAVENOUS

## 2023-09-11 DIAGNOSIS — I776 Arteritis, unspecified: Secondary | ICD-10-CM | POA: Diagnosis not present

## 2023-09-11 DIAGNOSIS — Z79899 Other long term (current) drug therapy: Secondary | ICD-10-CM | POA: Diagnosis not present

## 2023-09-13 DIAGNOSIS — Z79899 Other long term (current) drug therapy: Secondary | ICD-10-CM | POA: Diagnosis not present

## 2023-09-14 ENCOUNTER — Encounter: Payer: Self-pay | Admitting: Cardiology

## 2023-09-14 ENCOUNTER — Telehealth: Payer: Self-pay

## 2023-09-14 ENCOUNTER — Other Ambulatory Visit: Payer: Self-pay

## 2023-09-14 NOTE — Telephone Encounter (Signed)
 Auth Submission: NO AUTH NEEDED Site of care: Site of care: [facility] Payer: Medicare A/B only Medication & CPT/J Code(s) submitted: Reclast (Zolendronic acid) [id number] Route of submission (phone, fax, portal):  Phone # Fax # Auth type: Buy/Bill PB Units/visits requested: 5mg  x 1 dose Reference number:  Approval from: 09/14/23 to 06/11/24

## 2023-09-15 DIAGNOSIS — Z23 Encounter for immunization: Secondary | ICD-10-CM | POA: Diagnosis not present

## 2023-09-20 ENCOUNTER — Telehealth: Payer: Self-pay

## 2023-09-20 NOTE — Telephone Encounter (Signed)
 [name]: Long windsock appendage. Max 18/ AVG 16/ Depth 14.7 Likely use a 20mm device and double curve sheath Inf/Mid TSP RAO 20 CAU 25

## 2023-09-24 ENCOUNTER — Encounter: Payer: Self-pay | Admitting: Cardiology

## 2023-09-24 ENCOUNTER — Ambulatory Visit (INDEPENDENT_AMBULATORY_CARE_PROVIDER_SITE_OTHER)

## 2023-09-24 VITALS — BP 145/75 | HR 53 | Temp 98.0°F | Resp 14 | Ht 71.0 in | Wt 231.0 lb

## 2023-09-24 DIAGNOSIS — M810 Age-related osteoporosis without current pathological fracture: Secondary | ICD-10-CM | POA: Diagnosis not present

## 2023-09-24 MED ORDER — DIPHENHYDRAMINE HCL 25 MG PO CAPS
25.0000 mg | ORAL_CAPSULE | Freq: Once | ORAL | Status: AC
  Administered 2023-09-24: 25 mg via ORAL
  Filled 2023-09-24: qty 1

## 2023-09-24 MED ORDER — ACETAMINOPHEN 325 MG PO TABS
650.0000 mg | ORAL_TABLET | Freq: Once | ORAL | Status: AC
  Administered 2023-09-24: 650 mg via ORAL
  Filled 2023-09-24: qty 2

## 2023-09-24 MED ORDER — ZOLEDRONIC ACID 5 MG/100ML IV SOLN
5.0000 mg | Freq: Once | INTRAVENOUS | Status: AC
  Administered 2023-09-24: 5 mg via INTRAVENOUS
  Filled 2023-09-24: qty 100

## 2023-09-24 NOTE — Progress Notes (Signed)
 Diagnosis: Osteoporosis  Provider:  Phyllis Breeze MD  Procedure: IV Infusion  IV Type: Peripheral, IV Location: R Forearm  Reclast (Zolendronic Acid), Dose: 300 mg  Infusion Start Time: 1024  Infusion Stop Time: 1055  Post Infusion IV Care: Observation period completed  Discharge: Condition: Good, Destination: Home . AVS Provided  Performed by:  Natividad Balding, RN

## 2023-09-25 DIAGNOSIS — J301 Allergic rhinitis due to pollen: Secondary | ICD-10-CM | POA: Diagnosis not present

## 2023-09-25 DIAGNOSIS — J3081 Allergic rhinitis due to animal (cat) (dog) hair and dander: Secondary | ICD-10-CM | POA: Diagnosis not present

## 2023-09-25 DIAGNOSIS — J3089 Other allergic rhinitis: Secondary | ICD-10-CM | POA: Diagnosis not present

## 2023-09-27 ENCOUNTER — Other Ambulatory Visit: Payer: Self-pay

## 2023-09-27 DIAGNOSIS — I4819 Other persistent atrial fibrillation: Secondary | ICD-10-CM

## 2023-10-02 DIAGNOSIS — I10 Essential (primary) hypertension: Secondary | ICD-10-CM | POA: Diagnosis not present

## 2023-10-02 DIAGNOSIS — I4891 Unspecified atrial fibrillation: Secondary | ICD-10-CM | POA: Diagnosis not present

## 2023-10-02 DIAGNOSIS — I776 Arteritis, unspecified: Secondary | ICD-10-CM | POA: Diagnosis not present

## 2023-10-02 DIAGNOSIS — D8989 Other specified disorders involving the immune mechanism, not elsewhere classified: Secondary | ICD-10-CM | POA: Diagnosis not present

## 2023-10-02 DIAGNOSIS — G4700 Insomnia, unspecified: Secondary | ICD-10-CM | POA: Diagnosis not present

## 2023-10-02 DIAGNOSIS — G4733 Obstructive sleep apnea (adult) (pediatric): Secondary | ICD-10-CM | POA: Diagnosis not present

## 2023-10-02 DIAGNOSIS — G0481 Other encephalitis and encephalomyelitis: Secondary | ICD-10-CM | POA: Diagnosis not present

## 2023-10-11 ENCOUNTER — Telehealth: Payer: Self-pay

## 2023-10-11 NOTE — Telephone Encounter (Signed)
 Cory Mathis

## 2023-10-15 DIAGNOSIS — L309 Dermatitis, unspecified: Secondary | ICD-10-CM | POA: Diagnosis not present

## 2023-10-15 DIAGNOSIS — Z85828 Personal history of other malignant neoplasm of skin: Secondary | ICD-10-CM | POA: Diagnosis not present

## 2023-10-15 DIAGNOSIS — J3081 Allergic rhinitis due to animal (cat) (dog) hair and dander: Secondary | ICD-10-CM | POA: Diagnosis not present

## 2023-10-15 DIAGNOSIS — J3089 Other allergic rhinitis: Secondary | ICD-10-CM | POA: Diagnosis not present

## 2023-10-15 DIAGNOSIS — J301 Allergic rhinitis due to pollen: Secondary | ICD-10-CM | POA: Diagnosis not present

## 2023-10-21 DIAGNOSIS — I776 Arteritis, unspecified: Secondary | ICD-10-CM | POA: Diagnosis not present

## 2023-10-21 DIAGNOSIS — I6602 Occlusion and stenosis of left middle cerebral artery: Secondary | ICD-10-CM | POA: Diagnosis not present

## 2023-10-21 DIAGNOSIS — I6613 Occlusion and stenosis of bilateral anterior cerebral arteries: Secondary | ICD-10-CM | POA: Diagnosis not present

## 2023-10-21 DIAGNOSIS — I6789 Other cerebrovascular disease: Secondary | ICD-10-CM | POA: Diagnosis not present

## 2023-10-30 DIAGNOSIS — J301 Allergic rhinitis due to pollen: Secondary | ICD-10-CM | POA: Diagnosis not present

## 2023-10-30 DIAGNOSIS — J3089 Other allergic rhinitis: Secondary | ICD-10-CM | POA: Diagnosis not present

## 2023-10-30 DIAGNOSIS — J3081 Allergic rhinitis due to animal (cat) (dog) hair and dander: Secondary | ICD-10-CM | POA: Diagnosis not present

## 2023-10-31 DIAGNOSIS — R519 Headache, unspecified: Secondary | ICD-10-CM | POA: Diagnosis not present

## 2023-10-31 DIAGNOSIS — L738 Other specified follicular disorders: Secondary | ICD-10-CM | POA: Diagnosis not present

## 2023-11-01 DIAGNOSIS — G44029 Chronic cluster headache, not intractable: Secondary | ICD-10-CM | POA: Diagnosis not present

## 2023-11-02 DIAGNOSIS — J329 Chronic sinusitis, unspecified: Secondary | ICD-10-CM | POA: Diagnosis not present

## 2023-11-02 DIAGNOSIS — R519 Headache, unspecified: Secondary | ICD-10-CM | POA: Diagnosis not present

## 2023-11-02 DIAGNOSIS — J342 Deviated nasal septum: Secondary | ICD-10-CM | POA: Diagnosis not present

## 2023-11-06 DIAGNOSIS — J3089 Other allergic rhinitis: Secondary | ICD-10-CM | POA: Diagnosis not present

## 2023-11-06 DIAGNOSIS — R519 Headache, unspecified: Secondary | ICD-10-CM | POA: Diagnosis not present

## 2023-11-06 DIAGNOSIS — J301 Allergic rhinitis due to pollen: Secondary | ICD-10-CM | POA: Diagnosis not present

## 2023-11-09 DIAGNOSIS — E042 Nontoxic multinodular goiter: Secondary | ICD-10-CM | POA: Diagnosis not present

## 2023-11-19 ENCOUNTER — Telehealth: Payer: Self-pay | Admitting: Urology

## 2023-11-19 ENCOUNTER — Other Ambulatory Visit: Payer: Self-pay | Admitting: Internal Medicine

## 2023-11-19 DIAGNOSIS — J301 Allergic rhinitis due to pollen: Secondary | ICD-10-CM | POA: Diagnosis not present

## 2023-11-19 DIAGNOSIS — J3081 Allergic rhinitis due to animal (cat) (dog) hair and dander: Secondary | ICD-10-CM | POA: Diagnosis not present

## 2023-11-19 DIAGNOSIS — J3089 Other allergic rhinitis: Secondary | ICD-10-CM | POA: Diagnosis not present

## 2023-11-19 NOTE — Telephone Encounter (Signed)
 Pt called wanting to know if he can get his handicap sticker renewed?

## 2023-11-22 ENCOUNTER — Ambulatory Visit
Admission: RE | Admit: 2023-11-22 | Discharge: 2023-11-22 | Disposition: A | Discharge status: Home / Self Care | Source: Ambulatory Visit | Attending: Urology | Admitting: Urology

## 2023-11-22 DIAGNOSIS — K5730 Diverticulosis of large intestine without perforation or abscess without bleeding: Secondary | ICD-10-CM | POA: Diagnosis not present

## 2023-11-22 DIAGNOSIS — R972 Elevated prostate specific antigen [PSA]: Secondary | ICD-10-CM | POA: Diagnosis not present

## 2023-11-22 DIAGNOSIS — N429 Disorder of prostate, unspecified: Secondary | ICD-10-CM

## 2023-11-22 DIAGNOSIS — N400 Enlarged prostate without lower urinary tract symptoms: Secondary | ICD-10-CM | POA: Diagnosis not present

## 2023-11-22 MED ORDER — GADOPICLENOL 0.5 MMOL/ML IV SOLN
10.0000 mL | Freq: Once | INTRAVENOUS | Status: AC | PRN
  Administered 2023-11-22: 10 mL via INTRAVENOUS

## 2023-12-06 DIAGNOSIS — J3081 Allergic rhinitis due to animal (cat) (dog) hair and dander: Secondary | ICD-10-CM | POA: Diagnosis not present

## 2023-12-06 DIAGNOSIS — J3089 Other allergic rhinitis: Secondary | ICD-10-CM | POA: Diagnosis not present

## 2023-12-06 DIAGNOSIS — J301 Allergic rhinitis due to pollen: Secondary | ICD-10-CM | POA: Diagnosis not present

## 2023-12-19 NOTE — Progress Notes (Unsigned)
 Cardiology Office Note   Date:  12/19/2023   ID:  Cory Mathis, DOB June 30, 1950, MRN [id number]  PCP:  Dayna Motto, DO  Cardiologist:   Vina Gull, MD    Pt presents for f/u of CAD and PAF   History of Present Illness: Cory Mathis is a 73 y.o. 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   In  June 2020 he was placed on cardiazem and Xarelto    The pt is followed at [facility] for neuro and rheum     The pt was seen in afib clinic by A Tillery in Aug 2022.  Underwnt cardioversion after.  He was seen by C lambert in March 2023   Afib burden low  May 2023    Felt sluggish  Found to be back in  afib  Underwent DCCV    I saw the pt in clinic in June 2023   He underewent DCCV in Dec 2023  Felt better   Since then he has bee seen in afib clinic, last in Jan 2024   He was in SR at that times   Not interested in afib ablation  Possible AAD if recurrent afib     Since seen he denies CP   Breathing is OK   No dizziness  No palpitations  Activity lmited now by Achilles tendinitis      I saw the pt in 2024   He was seen by JAYSON Holts in the interval  Plan is for Watchman  No outpatient medications have been marked as taking for the 12/20/23 encounter (Appointment) with Gull Vina GAILS, MD.     Allergies:   Other, Sulfa antibiotics, Ambrosia artemisiifolia (ragweed) skin test, and Flagyl  [metronidazole ]   Past Medical History:  Diagnosis Date   Allergic rhinitis    Allergy    Atrial fibrillation with rapid ventricular response (HCC)    Benign neoplasm of right choroid    BPH (benign prostatic hyperplasia)    Brain tumor (benign) (HCC)    Cataract    bilateral,removed   Chest pain    Chronic headaches    Constipation    Diverticulosis of colon    Dysplastic nevus    Dysrhythmia 2019   Afib   Fatigue    Gastroesophageal reflux disease without esophagitis     GERD (gastroesophageal reflux disease)    prn med. control   H/O thyroidectomy    Headache    History of adenomatous polyp of colon    03-29-2007  tubular adenoma/  08-07-2016  hyperplastic and tubular adenoma's   History of alcoholic gastritis    10-30-2008  gastritis, esophagitis, peptic duodenitis   History of colonic diverticulitis    01-31-2016  resolved w/ medications   History of Helicobacter pylori infection    10-30-2008   History of thyroid  nodule    thyroid  multinodule goiter left side s/p  left thyroidectomy   Hypertension    Hypothyroidism    Insomnia    Internal hemorrhoids    Left leg weakness    Memory loss    Nocturia    OSA on CPAP    CPAP nightly   Paresthesia of both feet    Retinal pigmentation    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HCC)    Right thyroid  nodule    Sinus bradycardia    Sleep apnea    c pap  Urinary retention 12/13/2016    Past Surgical History:  Procedure Laterality Date   APPLICATION OF CRANIAL NAVIGATION N/A 04/06/2020   Procedure: APPLICATION OF CRANIAL NAVIGATION;  Surgeon: Cheryle Debby LABOR, MD;  Location: [facility] OR;  Service: Neurosurgery;  Laterality: N/A;   BRAIN SURGERY     CARDIOVERSION N/A 12/23/2018   Procedure: CARDIOVERSION;  Surgeon: Lonni Slain, MD;  Location: [facility] ENDOSCOPY;  Service: Cardiovascular;  Laterality: N/A;   CARDIOVERSION N/A 02/08/2021   Procedure: CARDIOVERSION;  Surgeon: Okey Vina GAILS, MD;  Location: [facility] ENDOSCOPY;  Service: Cardiovascular;  Laterality: N/A;   CARDIOVERSION N/A 06/01/2022   Procedure: CARDIOVERSION;  Surgeon: Kate Lonni CROME, MD;  Location: [facility] ENDOSCOPY;  Service: Cardiovascular;  Laterality: N/A;   COLONOSCOPY  last one 08-07-2016   EYE SURGERY     FRAMELESS  BIOPSY WITH BRAINLAB Left 04/06/2020   Procedure: Left Stereotactic brain biopsy with brainlab;  Surgeon: Cheryle Debby LABOR, MD;  Location: [facility] OR;  Service: Neurosurgery;  Laterality: Left;   HEMORRHOID BANDING  10-20-2016;   09-11-2016   HERNIA REPAIR     KNEE ARTHROSCOPY Bilateral    10 + yrs ago   LASIK Bilateral    PILONIDAL CYST EXCISION     POLYPECTOMY     THYROIDECTOMY Left 2013   TRANSURETHRAL RESECTION OF PROSTATE N/A 01/04/2017   Procedure: TRANSURETHRAL RESECTION OF THE PROSTATE (TURP);  Surgeon: Matilda Senior, MD;  Location: [facility];  Service: Urology;  Laterality: N/A;   UPPER GASTROINTESTINAL ENDOSCOPY  10/30/2008   WISDOM TOOTH EXTRACTION       Social History:  The patient  reports that he has quit smoking. He has never used smokeless tobacco. He reports that he does not currently use alcohol. He reports that he does not use drugs.   Family History:  The patient's family history includes Heart attack in his mother; Other in his father and sister; Prostate cancer in his maternal grandfather.    ROS:  Please see the history of present illness. All other systems are reviewed and  Negative to the above problem except as noted.    PHYSICAL EXAM: VS:  There were no vitals taken for this visit.   GEN:  Pt is in no acute distress  HEENT: normal  Neck: no JVD, no  bruits Cardiac: RRR; no murmurs  No LE edema  Good PT pulses  Respiratory:  clear to auscultation GI: soft, nontender   No hepatomegaly    EKG:  EKG is ordered today  SR 66 RBBB   Lipid Panel    Component Value Date/Time   CHOL 106 08/22/2019 1432   TRIG 118 08/22/2019 1432   HDL 38 (L) 08/22/2019 1432   CHOLHDL 2.8 08/22/2019 1432   LDLCALC 47 08/22/2019 1432      Wt Readings from Last 3 Encounters:  09/24/23 231 lb (104.8 kg)  07/26/23 230 lb 9.6 oz (104.6 kg)  06/20/23 231 lb (104.8 kg)      ASSESSMENT AND PLAN:  1  PAF  Pt remains in SR    Will continue on current regimen   Follow up with afib clinic in Winter  I will follow up after     2  Hx of CAD.  On CT scan  pt without symtoms  His activity is limited some but I do no get a hx suspicous for angina  3  HTN  BP is not optimal  At  home he says in 130s to 150s  WIll add low dose losartan    Follow BP    4   HL LDL in May 2024 was 59  HDL 39  Trig 69  Great control  Continue   5  Neuro   Continues to follow at [facility] Seen earlier this year     6  CV dz  Atherosclerosis on head CT    Rx lipids and BP    Current medicines are reviewed at length with the patient today.  The patient does not have concerns regarding medicines.  Signed, Vina Gull, MD  12/19/2023 9:42 AM    [facility] [address] [place], [place], [place]  [address] Phone: [phone]; Fax: [phone]

## 2023-12-20 ENCOUNTER — Ambulatory Visit: Attending: Internal Medicine | Admitting: Internal Medicine

## 2023-12-20 ENCOUNTER — Encounter: Payer: Self-pay | Admitting: Internal Medicine

## 2023-12-20 ENCOUNTER — Other Ambulatory Visit (HOSPITAL_COMMUNITY): Payer: Self-pay

## 2023-12-20 ENCOUNTER — Other Ambulatory Visit: Payer: Self-pay | Admitting: *Deleted

## 2023-12-20 ENCOUNTER — Encounter: Payer: Self-pay | Admitting: Family Medicine

## 2023-12-20 VITALS — BP 140/60 | HR 59 | Ht 71.0 in | Wt 226.0 lb

## 2023-12-20 DIAGNOSIS — Z01812 Encounter for preprocedural laboratory examination: Secondary | ICD-10-CM | POA: Insufficient documentation

## 2023-12-20 DIAGNOSIS — E785 Hyperlipidemia, unspecified: Secondary | ICD-10-CM | POA: Insufficient documentation

## 2023-12-20 DIAGNOSIS — Z7901 Long term (current) use of anticoagulants: Secondary | ICD-10-CM | POA: Insufficient documentation

## 2023-12-20 DIAGNOSIS — R001 Bradycardia, unspecified: Secondary | ICD-10-CM | POA: Insufficient documentation

## 2023-12-20 DIAGNOSIS — I4819 Other persistent atrial fibrillation: Secondary | ICD-10-CM | POA: Insufficient documentation

## 2023-12-20 DIAGNOSIS — K642 Third degree hemorrhoids: Secondary | ICD-10-CM | POA: Diagnosis not present

## 2023-12-20 DIAGNOSIS — Z79899 Other long term (current) drug therapy: Secondary | ICD-10-CM | POA: Insufficient documentation

## 2023-12-20 MED ORDER — RIVAROXABAN 20 MG PO TABS
20.0000 mg | ORAL_TABLET | Freq: Every day | ORAL | 1 refills | Status: DC
  Filled 2023-12-20: qty 90, 90d supply, fill #0

## 2023-12-20 NOTE — Telephone Encounter (Signed)
 Prescription refill request for Xarelto  received.  Indication: afib  Last office visit: 07/26/2023, lambert Weight: 104.8 kg  Age: 73 yo  Scr: 0.9, 09/04/2023 CrCl: 107 ml/min   Refill sent.

## 2023-12-20 NOTE — Patient Instructions (Signed)
 Medication Instructions:  Please decrease your Losartan  to 1/2 tablet once daily. Continue all other medications as listed.  *If you need a refill on your cardiac medications before your next appointment, please call your pharmacy*  Lab Work: Please have blood work today as ordered.  If you have labs (blood work) drawn today and your tests are completely normal, you will receive your results only by: MyChart Message (if you have MyChart) OR A paper copy in the mail If you have any lab test that is abnormal or we need to change your treatment, we will call you to review the results.  Testing/Procedures: Watchman as scheduled  Follow-Up: At [facility], you and your health needs are our priority.  As part of our continuing mission to provide you with exceptional heart care, our providers are all part of one team.  This team includes your primary Cardiologist (physician) and Advanced Practice Providers or APPs (Physician Assistants and Nurse Practitioners) who all work together to provide you with the care you need, when you need it.  Your next appointment:   10 month(s)  (due May 2026)  Provider:   Vina Gull, MD    We recommend signing up for the patient portal called MyChart.  Sign up information is provided on this After Visit Summary.  MyChart is used to connect with patients for Virtual Visits (Telemedicine).  Patients are able to view lab/test results, encounter notes, upcoming appointments, etc.  Non-urgent messages can be sent to your provider as well.   To learn more about what you can do with MyChart, go to [email or web address].

## 2023-12-21 ENCOUNTER — Ambulatory Visit: Payer: Self-pay | Admitting: *Deleted

## 2023-12-21 ENCOUNTER — Other Ambulatory Visit (HOSPITAL_COMMUNITY): Payer: Self-pay

## 2023-12-21 LAB — CBC
Hematocrit: 46.1 % (ref 37.5–51.0)
Hemoglobin: 15.1 g/dL (ref 13.0–17.7)
MCH: 32.3 pg (ref 26.6–33.0)
MCHC: 32.8 g/dL (ref 31.5–35.7)
MCV: 99 fL — ABNORMAL HIGH (ref 79–97)
Platelets: 335 x10E3/uL (ref 150–450)
RBC: 4.67 x10E6/uL (ref 4.14–5.80)
RDW: 12.3 % (ref 11.6–15.4)
WBC: 11.4 x10E3/uL — ABNORMAL HIGH (ref 3.4–10.8)

## 2023-12-21 LAB — NMR, LIPOPROFILE
Cholesterol, Total: 144 mg/dL (ref 100–199)
HDL Particle Number: 35.3 umol/L (ref >=30.5)
HDL-C: 53 mg/dL (ref >39)
LDL Particle Number: 920 nmol/L (ref <1000)
LDL Size: 20.6 nm (ref >20.5)
LDL-C (NIH Calc): 77 mg/dL (ref 0–99)
LP-IR Score: 26 (ref <=45)
Small LDL Particle Number: 471 nmol/L (ref <=527)
Triglycerides: 69 mg/dL (ref 0–149)

## 2023-12-21 LAB — BASIC METABOLIC PANEL WITH GFR
BUN/Creatinine Ratio: 15 (ref 10–24)
BUN: 12 mg/dL (ref 8–27)
CO2: 23 mmol/L (ref 20–29)
Calcium: 10.1 mg/dL (ref 8.6–10.2)
Chloride: 103 mmol/L (ref 96–106)
Creatinine, Ser: 0.78 mg/dL (ref 0.76–1.27)
Glucose: 99 mg/dL (ref 70–99)
Potassium: 4.9 mmol/L (ref 3.5–5.2)
Sodium: 141 mmol/L (ref 134–144)
eGFR: 94 mL/min/1.73 (ref >59)

## 2023-12-21 LAB — TSH: TSH: 0.301 u[IU]/mL — ABNORMAL LOW (ref 0.450–4.500)

## 2023-12-24 ENCOUNTER — Other Ambulatory Visit: Payer: Self-pay

## 2023-12-24 DIAGNOSIS — Z79899 Other long term (current) drug therapy: Secondary | ICD-10-CM

## 2023-12-24 DIAGNOSIS — J3089 Other allergic rhinitis: Secondary | ICD-10-CM | POA: Diagnosis not present

## 2023-12-24 DIAGNOSIS — J301 Allergic rhinitis due to pollen: Secondary | ICD-10-CM | POA: Diagnosis not present

## 2023-12-24 DIAGNOSIS — I4891 Unspecified atrial fibrillation: Secondary | ICD-10-CM

## 2023-12-24 DIAGNOSIS — J3081 Allergic rhinitis due to animal (cat) (dog) hair and dander: Secondary | ICD-10-CM | POA: Diagnosis not present

## 2023-12-24 DIAGNOSIS — I4819 Other persistent atrial fibrillation: Secondary | ICD-10-CM

## 2023-12-24 DIAGNOSIS — Z7901 Long term (current) use of anticoagulants: Secondary | ICD-10-CM

## 2023-12-24 MED ORDER — RIVAROXABAN 20 MG PO TABS
20.0000 mg | ORAL_TABLET | Freq: Every day | ORAL | 1 refills | Status: DC
Start: 2023-12-24 — End: 2024-03-10

## 2023-12-25 DIAGNOSIS — Z79899 Other long term (current) drug therapy: Secondary | ICD-10-CM | POA: Diagnosis not present

## 2023-12-25 DIAGNOSIS — I776 Arteritis, unspecified: Secondary | ICD-10-CM | POA: Diagnosis not present

## 2023-12-25 DIAGNOSIS — Z79620 Long term (current) use of immunosuppressive biologic: Secondary | ICD-10-CM | POA: Diagnosis not present

## 2023-12-25 DIAGNOSIS — Z7952 Long term (current) use of systemic steroids: Secondary | ICD-10-CM | POA: Diagnosis not present

## 2023-12-25 DIAGNOSIS — G0481 Other encephalitis and encephalomyelitis: Secondary | ICD-10-CM | POA: Diagnosis not present

## 2023-12-25 DIAGNOSIS — M810 Age-related osteoporosis without current pathological fracture: Secondary | ICD-10-CM | POA: Diagnosis not present

## 2023-12-25 DIAGNOSIS — I677 Cerebral arteritis, not elsewhere classified: Secondary | ICD-10-CM | POA: Diagnosis not present

## 2023-12-25 DIAGNOSIS — Z87891 Personal history of nicotine dependence: Secondary | ICD-10-CM | POA: Diagnosis not present

## 2023-12-28 NOTE — Telephone Encounter (Signed)
 Error

## 2024-01-07 DIAGNOSIS — H40013 Open angle with borderline findings, low risk, bilateral: Secondary | ICD-10-CM | POA: Diagnosis not present

## 2024-01-07 DIAGNOSIS — D3131 Benign neoplasm of right choroid: Secondary | ICD-10-CM | POA: Diagnosis not present

## 2024-01-07 DIAGNOSIS — H3581 Retinal edema: Secondary | ICD-10-CM | POA: Diagnosis not present

## 2024-01-07 DIAGNOSIS — H26493 Other secondary cataract, bilateral: Secondary | ICD-10-CM | POA: Diagnosis not present

## 2024-01-07 DIAGNOSIS — H52213 Irregular astigmatism, bilateral: Secondary | ICD-10-CM | POA: Diagnosis not present

## 2024-01-07 DIAGNOSIS — H524 Presbyopia: Secondary | ICD-10-CM | POA: Diagnosis not present

## 2024-01-08 DIAGNOSIS — J3089 Other allergic rhinitis: Secondary | ICD-10-CM | POA: Diagnosis not present

## 2024-01-08 DIAGNOSIS — J3081 Allergic rhinitis due to animal (cat) (dog) hair and dander: Secondary | ICD-10-CM | POA: Diagnosis not present

## 2024-01-08 DIAGNOSIS — J301 Allergic rhinitis due to pollen: Secondary | ICD-10-CM | POA: Diagnosis not present

## 2024-01-09 ENCOUNTER — Telehealth: Payer: Self-pay

## 2024-01-09 NOTE — Telephone Encounter (Signed)
 Confirmed procedure date of 01/10/2024. Confirmed arrival time of 0700 for procedure time at 0930. Reviewed pre-procedure instructions with patient and wife in detail. Confirmed he has no contrast allergy and no current PPM/defibrillator. The patient understands to call if questions/concerns arise prior to procedure.  They were grateful for call.

## 2024-01-10 ENCOUNTER — Inpatient Hospital Stay (HOSPITAL_COMMUNITY)
Admission: RE | Admit: 2024-01-10 | Discharge: 2024-01-10 | DRG: 274 | Disposition: A | Discharge status: Home / Self Care | Attending: Cardiology | Admitting: Cardiology

## 2024-01-10 ENCOUNTER — Inpatient Hospital Stay (HOSPITAL_COMMUNITY)
Admission: RE | Disposition: A | Discharge status: Home / Self Care | Payer: Self-pay | Source: Home / Self Care | Attending: Cardiology

## 2024-01-10 ENCOUNTER — Encounter (HOSPITAL_COMMUNITY): Payer: Self-pay | Admitting: Cardiology

## 2024-01-10 ENCOUNTER — Inpatient Hospital Stay (HOSPITAL_COMMUNITY)

## 2024-01-10 ENCOUNTER — Other Ambulatory Visit: Payer: Self-pay

## 2024-01-10 ENCOUNTER — Inpatient Hospital Stay (HOSPITAL_COMMUNITY): Admitting: Anesthesiology

## 2024-01-10 DIAGNOSIS — Z7902 Long term (current) use of antithrombotics/antiplatelets: Secondary | ICD-10-CM | POA: Diagnosis not present

## 2024-01-10 DIAGNOSIS — Z888 Allergy status to other drugs, medicaments and biological substances status: Secondary | ICD-10-CM

## 2024-01-10 DIAGNOSIS — Z882 Allergy status to sulfonamides status: Secondary | ICD-10-CM

## 2024-01-10 DIAGNOSIS — Q2112 Patent foramen ovale: Secondary | ICD-10-CM

## 2024-01-10 DIAGNOSIS — I2510 Atherosclerotic heart disease of native coronary artery without angina pectoris: Secondary | ICD-10-CM | POA: Diagnosis not present

## 2024-01-10 DIAGNOSIS — I340 Nonrheumatic mitral (valve) insufficiency: Secondary | ICD-10-CM | POA: Diagnosis present

## 2024-01-10 DIAGNOSIS — G939 Disorder of brain, unspecified: Secondary | ICD-10-CM | POA: Diagnosis present

## 2024-01-10 DIAGNOSIS — E039 Hypothyroidism, unspecified: Secondary | ICD-10-CM | POA: Diagnosis not present

## 2024-01-10 DIAGNOSIS — Z006 Encounter for examination for normal comparison and control in clinical research program: Secondary | ICD-10-CM | POA: Diagnosis not present

## 2024-01-10 DIAGNOSIS — I10 Essential (primary) hypertension: Secondary | ICD-10-CM

## 2024-01-10 DIAGNOSIS — Z7901 Long term (current) use of anticoagulants: Secondary | ICD-10-CM | POA: Diagnosis not present

## 2024-01-10 DIAGNOSIS — G4733 Obstructive sleep apnea (adult) (pediatric): Secondary | ICD-10-CM | POA: Diagnosis present

## 2024-01-10 DIAGNOSIS — I4819 Other persistent atrial fibrillation: Secondary | ICD-10-CM | POA: Diagnosis present

## 2024-01-10 DIAGNOSIS — G40909 Epilepsy, unspecified, not intractable, without status epilepticus: Secondary | ICD-10-CM | POA: Diagnosis present

## 2024-01-10 DIAGNOSIS — I4891 Unspecified atrial fibrillation: Secondary | ICD-10-CM

## 2024-01-10 HISTORY — PX: TRANSESOPHAGEAL ECHOCARDIOGRAM (CATH LAB): EP1270

## 2024-01-10 HISTORY — PX: LEFT ATRIAL APPENDAGE OCCLUSION: EP1229

## 2024-01-10 LAB — TYPE AND SCREEN
ABO/RH(D): A POS
Antibody Screen: NEGATIVE

## 2024-01-10 LAB — SURGICAL PCR SCREEN
MRSA, PCR: NEGATIVE
Staphylococcus aureus: NEGATIVE

## 2024-01-10 LAB — POCT ACTIVATED CLOTTING TIME: Activated Clotting Time: 343 s

## 2024-01-10 LAB — ECHO TEE

## 2024-01-10 MED ORDER — HEPARIN (PORCINE) IN NACL 1000-0.9 UT/500ML-% IV SOLN
INTRAVENOUS | Status: DC | PRN
  Administered 2024-01-10 (×2): 500 mL

## 2024-01-10 MED ORDER — CHLORHEXIDINE GLUCONATE 4 % EX SOLN
Freq: Once | CUTANEOUS | Status: DC
  Filled 2024-01-10: qty 15

## 2024-01-10 MED ORDER — IOHEXOL 350 MG/ML SOLN
INTRAVENOUS | Status: DC | PRN
  Administered 2024-01-10: 25 mL

## 2024-01-10 MED ORDER — CEFAZOLIN SODIUM-DEXTROSE 2-4 GM/100ML-% IV SOLN
2.0000 g | INTRAVENOUS | Status: AC
  Administered 2024-01-10: 2 g via INTRAVENOUS
  Filled 2024-01-10: qty 100

## 2024-01-10 MED ORDER — RIVAROXABAN 20 MG PO TABS
20.0000 mg | ORAL_TABLET | Freq: Once | ORAL | Status: AC
  Administered 2024-01-10: 20 mg via ORAL
  Filled 2024-01-10: qty 1

## 2024-01-10 MED ORDER — SODIUM CHLORIDE 0.9% FLUSH: 3.0000 mL | INTRAVENOUS | Status: DC | PRN

## 2024-01-10 MED ORDER — DEXAMETHASONE SODIUM PHOSPHATE 10 MG/ML IJ SOLN
INTRAMUSCULAR | Status: DC | PRN
  Administered 2024-01-10: 5 mg via INTRAVENOUS

## 2024-01-10 MED ORDER — PROTAMINE SULFATE 10 MG/ML IV SOLN
INTRAVENOUS | Status: DC | PRN
  Administered 2024-01-10: 30 mg via INTRAVENOUS

## 2024-01-10 MED ORDER — ONDANSETRON HCL 4 MG/2ML IJ SOLN: 4.0000 mg | Freq: Four times a day (QID) | INTRAMUSCULAR | Status: DC | PRN

## 2024-01-10 MED ORDER — LIDOCAINE 2% (20 MG/ML) 5 ML SYRINGE
INTRAMUSCULAR | Status: DC | PRN
  Administered 2024-01-10: 100 mg via INTRAVENOUS

## 2024-01-10 MED ORDER — SODIUM CHLORIDE 0.9 % IV SOLN: 250.0000 mL | INTRAVENOUS | Status: DC | PRN

## 2024-01-10 MED ORDER — PROPOFOL 10 MG/ML IV BOLUS
INTRAVENOUS | Status: DC | PRN
  Administered 2024-01-10: 200 mg via INTRAVENOUS

## 2024-01-10 MED ORDER — HEPARIN SODIUM (PORCINE) 1000 UNIT/ML IJ SOLN
INTRAMUSCULAR | Status: DC | PRN
Start: 2024-01-10 — End: 2024-01-10
  Administered 2024-01-10: 15000 [IU] via INTRAVENOUS

## 2024-01-10 MED ORDER — SODIUM CHLORIDE 0.9% FLUSH
3.0000 mL | Freq: Two times a day (BID) | INTRAVENOUS | Status: DC
  Administered 2024-01-10: 3 mL via INTRAVENOUS

## 2024-01-10 MED ORDER — FENTANYL CITRATE (PF) 250 MCG/5ML IJ SOLN
INTRAMUSCULAR | Status: DC | PRN
  Administered 2024-01-10 (×2): 50 ug via INTRAVENOUS

## 2024-01-10 MED ORDER — ACETAMINOPHEN 325 MG PO TABS: 650.0000 mg | ORAL_TABLET | ORAL | Status: DC | PRN

## 2024-01-10 MED ORDER — PHENYLEPHRINE 80 MCG/ML (10ML) SYRINGE FOR IV PUSH (FOR BLOOD PRESSURE SUPPORT)
PREFILLED_SYRINGE | INTRAVENOUS | Status: DC | PRN
  Administered 2024-01-10: 80 ug via INTRAVENOUS

## 2024-01-10 MED ORDER — ROCURONIUM BROMIDE 10 MG/ML (PF) SYRINGE
PREFILLED_SYRINGE | INTRAVENOUS | Status: DC | PRN
  Administered 2024-01-10: 60 mg via INTRAVENOUS

## 2024-01-10 MED ORDER — PHENYLEPHRINE HCL-NACL 20-0.9 MG/250ML-% IV SOLN
INTRAVENOUS | Status: DC | PRN
Start: 2024-01-10 — End: 2024-01-10
  Administered 2024-01-10: 40 ug/min via INTRAVENOUS

## 2024-01-10 MED ORDER — EPHEDRINE SULFATE-NACL 50-0.9 MG/10ML-% IV SOSY
PREFILLED_SYRINGE | INTRAVENOUS | Status: DC | PRN
  Administered 2024-01-10: 5 mg via INTRAVENOUS
  Administered 2024-01-10: 10 mg via INTRAVENOUS

## 2024-01-10 MED ORDER — FENTANYL CITRATE (PF) 100 MCG/2ML IJ SOLN
INTRAMUSCULAR | Status: AC
  Filled 2024-01-10: qty 2

## 2024-01-10 MED ORDER — ONDANSETRON HCL 4 MG/2ML IJ SOLN
INTRAMUSCULAR | Status: DC | PRN
  Administered 2024-01-10: 4 mg via INTRAVENOUS

## 2024-01-10 MED ORDER — SODIUM CHLORIDE 0.9 % IV SOLN: INTRAVENOUS | Status: DC | PRN

## 2024-01-10 MED ORDER — SODIUM CHLORIDE 0.9 % IV SOLN: INTRAVENOUS | Status: DC

## 2024-01-10 MED ORDER — CHLORHEXIDINE GLUCONATE 0.12 % MT SOLN
OROMUCOSAL | Status: AC
  Administered 2024-01-10: 15 mL
  Filled 2024-01-10: qty 15

## 2024-01-10 MED ORDER — SUGAMMADEX SODIUM 200 MG/2ML IV SOLN
INTRAVENOUS | Status: DC | PRN
  Administered 2024-01-10: 200 mg via INTRAVENOUS

## 2024-01-10 NOTE — Discharge Summary (Signed)
 Electrophysiology Discharge Summary   Patient ID: Cory Mathis,  MRN: [id number], DOB/AGE: July 09, 1950 73 y.o.  Admit date: 01/10/2024 Discharge date: 01/10/2024  Primary Care Physician: Dayna Motto, DO  Primary Cardiologist: Vina Gull, MD  Electrophysiologist: OLE ONEIDA HOLTS, MD    Primary Discharge Diagnosis:  Persistent Atrial Fibrillation Poor candidacy for long term anticoagulation due to preference to avoid long-term oral anticoagulation/fall risk/bleeding risk CHA2DS2Vasc is 3  Secondary Discharge Diagnosis:  Coronary Ca+ by CT HTN OSA W/CPAP Seizure d/o L frontal brain mass Neurovasculitis multinodular goiter (s/p L thyroidectomy)   Procedures This Admission:  Transeptal Puncture Intra-procedural TEE which showed no LAA thrombus Left atrial appendage occlusive device placement on 01/10/24 by Dr. HOLTS.  CONCLUSIONS:  1.Successful implantation of a WATCHMAN left atrial appendage occlusive device    2. TEE demonstrating no LAA thrombus 3. No early apparent complications.      Post Implant Anticoagulation Strategy: Continue Xarelto  20mg  by mouth once daily for 45 days after implant. After 45 days, stop Xarelto  and start Plavix 75mg  by mouth once daily to complete 6 months of post implant medical therapy. Plan for CT scan 60 days after implant to assess appendage patency and Watchman position.  Brief HPI: Cory Mathis is a 73 y.o. male with a history of Persistent Atrial Fibrillation who was referred to Electrophysiology in the outpatient setting    Hospital Course:  The patient was admitted and underwent left atrial appendage occlusive device placement as above.  The patient was monitored in the post procedure setting and has done very well with no concerns. Given this, he/she is being considered for same day discharge later today. Groin site has been stable without evidence of hematoma or bleeding. Wound care and restrictions were reviewed with the patient.    The patient has been scheduled for post procedure follow up with EP APP in approximately 6 weeks. They will restart Xarelto  this evening and continue for 45 days then stop. At that time he will transition to Plavix 75mg  daily to complete 6 months of therapy. They will require dental SBE for 6 month post op and should refrain from dental work or cleanings for the first 45 days post implant. SBE to be RXd at follow up.   A repeat CT scan will be performed in approximately 60 days to ensure proper seal of the device.    Physical Exam: Vitals:   01/10/24 1040 01/10/24 1045 01/10/24 1100 01/10/24 1217  BP: (!) 122/58 122/60 115/61 (!) 115/57  Pulse: (!) 54 (!) 52 (!) 52 (!) 54  Resp: 13 11 10 14   Temp:  97.9 F (36.6 C)  97.8 F (36.6 C)  TempSrc:  Oral  Oral  SpO2: 94% 92% 94% 94%  Weight:      Height:        GEN: Well nourished, well developed in no acute distress NECK: No JVD; No carotid bruits CARDIAC: Regular rate and rhythm, no murmurs, rubs, gallops RESPIRATORY:  Clear to auscultation without rales, wheezing or rhonchi  ABDOMEN: Soft, non-tender, non-distended EXTREMITIES:  No edema; No deformity. Groin site Stable, bleeding, hematoma, tenderness    Discharge Medications:  Allergies as of 01/10/2024       Reactions   Other    Mints - makes him sneeze   Sulfa Antibiotics Hives, Rash   Ambrosia Artemisiifolia (ragweed) Skin Test Other (See Comments)   hayfever   Flagyl  [metronidazole ] Other (See Comments)   Pt reported he was light headed for 2 weeks.  Medication List     TAKE these medications    diltiazem  120 MG 24 hr capsule Commonly known as: CARDIZEM  CD TAKE 1 CAPSULE(120 MG) BY MOUTH TWICE DAILY   EPINEPHrine  0.3 mg/0.3 mL Soaj injection Commonly known as: EPI-PEN Inject 0.3 mg into the muscle as needed for anaphylaxis.   eszopiclone 2 MG Tabs tablet Commonly known as: LUNESTA Take 2 mg by mouth at bedtime. Take immediately before bedtime    famotidine-calcium  carbonate-magnesium hydroxide 10-800-165 MG chewable tablet Commonly known as: PEPCID COMPLETE Chew 1 tablet by mouth at bedtime as needed (acid reflux).   Gemtesa 75 MG Tabs Generic drug: Vibegron Take 75 mg by mouth at bedtime.   losartan  50 MG tablet Commonly known as: COZAAR  Take 1 tablet (50 mg total) by mouth daily.   LUBRICATING EYE DROPS OP Place 1 drop into both eyes daily as needed (dry eyes).   predniSONE  10 MG tablet Commonly known as: DELTASONE  Take 10 mg by mouth daily.   predniSONE  5 MG tablet Commonly known as: DELTASONE  Take 7.5 mg by mouth daily with breakfast.   rivaroxaban  20 MG Tabs tablet Commonly known as: Xarelto  Take 1 tablet (20 mg total) by mouth daily with supper. What changed: when to take this   rosuvastatin  5 MG tablet Commonly known as: CRESTOR  TAKE 1/2 TABLET(2.5 MG) BY MOUTH DAILY   VITAMIN D3-VITAMIN C PO Take 1 tablet by mouth daily.        Disposition:  Home with usual follow up as in AVS  Duration of Discharge Encounter:  APP Time: 13 min  Signed, Charlies Macario Arthur, PA-C  01/10/2024 2:19 PM

## 2024-01-10 NOTE — Anesthesia Procedure Notes (Signed)
 Procedure Name: Intubation Date/Time: 01/10/2024 9:09 AM  Performed by: Jerl Donald LABOR, CRNAPre-anesthesia Checklist: Patient identified, Emergency Drugs available, Suction available and Patient being monitored Patient Re-evaluated:Patient Re-evaluated prior to induction Oxygen Delivery Method: Circle System Utilized Preoxygenation: Pre-oxygenation with 100% oxygen Induction Type: IV induction Ventilation: Two handed mask ventilation required, Mask ventilation with difficulty and Oral airway inserted - appropriate to patient size Laryngoscope Size: Mac and 4 Grade View: Grade II Tube type: Oral Tube size: 7.5 mm Number of attempts: 1 Airway Equipment and Method: Stylet and Oral airway Placement Confirmation: ETT inserted through vocal cords under direct vision, positive ETCO2 and breath sounds checked- equal and bilateral Secured at: 23 cm Tube secured with: Tape Dental Injury: Teeth and Oropharynx as per pre-operative assessment

## 2024-01-10 NOTE — Discharge Instructions (Signed)
 [facility] Procedure, Care After  Procedure MD: Dr. Isidoro Donning Clinical Coordinator: Karsten Fells, RN  This sheet gives you information about how to care for yourself after your procedure. Your health care provider may also give you more specific instructions. If you have problems or questions, contact your health care provider.  What can I expect after the procedure? After the procedure, it is common to have: Bruising around your puncture site. Tenderness around your puncture site. Tiredness (fatigue).  Medication instructions It is very important to continue to take your blood thinner as directed by your doctor after the Watchman procedure. Call your procedure doctor's office with question or concerns. If you are on Coumadin (warfarin), you will have your INR checked the week after your procedure, with a goal INR of 2.0 - 3.0. Please follow your medication instructions on your discharge summary. Only take the medications listed on your discharge paperwork.  Follow up You will be seen in 6 weeks after your procedure You will have a repeat CT scan or Echocardiogram approximately 8 weeks after your procedure mark to check your device You will follow up the MD/APP who performed your procedure 6 months after your procedure The Watchman Clinical Coordinator will check in with you from time to time, including 1 and 2 years after your procedure.  NO DENTAL CLEANINGS FOR 45 days. After that, you will require antibiotics for dental procedures the first 6 months.   Follow these instructions at home: Puncture site care  Follow instructions from your health care provider about how to take care of your puncture site. Make sure you: If present, leave stitches (sutures), skin glue, or adhesive strips in place.  If a large square bandage is present, this may be removed 24 hours after surgery.  Check your puncture site every day for signs of infection. Check for: Redness, swelling, or pain. Fluid  or blood. If your puncture site starts to bleed, lie down on your back, apply firm pressure to the area, and contact your health care provider. Warmth. Pus or a bad smell. Driving Do not drive yourself home if you received sedation Do not drive for at least 4 days after your procedure or however long your health care provider recommends. (Do not resume driving if you have previously been instructed not to drive for other health reasons.) Do not spend greater than 1 hour at a time in a car for the first 3 days. Stop and take a break with a 5 minute walk at least every hour.  Do not drive or use heavy machinery while taking prescription pain medicine.  Activity Avoid activities that take a lot of effort, including exercise, for at least 7 days after your procedure. For the first 3 days, avoid sitting for longer than one hour at a time.  Avoid alcoholic beverages, signing paperwork, or participating in legal proceedings for 24 hours after receiving sedation Do not lift anything that is heavier than 10 lb (4.5 kg) for one week.  No sexual activity for 1 week.  Return to your normal activities as told by your health care provider. Ask your health care provider what activities are safe for you. General instructions Take over-the-counter and prescription medicines only as told by your health care provider. Do not use any products that contain nicotine or tobacco, such as cigarettes and e-cigarettes. If you need help quitting, ask your health care provider. You may shower after 24 hours, but Do not take baths, swim, or use a hot tub for  1 week.  Do not drink alcohol for 24 hours after your procedure. Keep all follow-up visits as told by your health care provider. This is important. Dental Work: You will require antibiotics prior to any dental work, including cleanings, for 6 months after your Watchman implantation to help protect you from infection. After 6 months, antibiotics are no longer  required. Contact a health care provider if: You have redness, mild swelling, or pain around your puncture site. You have soreness in your throat or at your puncture site that does not improve after several days You have fluid or blood coming from your puncture site that stops after applying firm pressure to the area. Your puncture site feels warm to the touch. You have pus or a bad smell coming from your puncture site. You have a fever. You have chest pain or discomfort that spreads to your neck, jaw, or arm. You are sweating a lot. You feel nauseous. You have a fast or irregular heartbeat. You have shortness of breath. You are dizzy or light-headed and feel the need to lie down. You have pain or numbness in the arm or leg closest to your puncture site. Get help right away if: Your puncture site suddenly swells. Your puncture site is bleeding and the bleeding does not stop after applying firm pressure to the area. These symptoms may represent a serious problem that is an emergency. Do not wait to see if the symptoms will go away. Get medical help right away. Call your local emergency services (911 in the [place] Do not drive yourself to the hospital. Summary After the procedure, it is normal to have bruising and tenderness at the puncture site in your groin, neck, or forearm. Check your puncture site every day for signs of infection. Get help right away if your puncture site is bleeding and the bleeding does not stop after applying firm pressure to the area. This is a medical emergency.  This information is not intended to replace advice given to you by your health care provider. Make sure you discuss any questions you have with your health care provider.

## 2024-01-10 NOTE — Progress Notes (Signed)
 Discharge instructions (including medications) discussed with and copy provided to patient/caregiver

## 2024-01-10 NOTE — Transfer of Care (Signed)
 Immediate Anesthesia Transfer of Care Note  Patient: Cory Mathis  Procedure(s) Performed: LEFT ATRIAL APPENDAGE OCCLUSION TRANSESOPHAGEAL ECHOCARDIOGRAM  Patient Location: PACU  Anesthesia Type:General  Level of Consciousness: awake and alert   Airway & Oxygen Therapy: Patient Spontanous Breathing  Post-op Assessment: Report given to RN and Post -op Vital signs reviewed and stable  Post vital signs: Reviewed and stable  Last Vitals:  Vitals Value Taken Time  BP 122/60 01/10/24 10:45  Temp 36.7 C 01/10/24 10:15  Pulse 51 01/10/24 10:46  Resp 10 01/10/24 10:46  SpO2 94 % 01/10/24 10:46  Vitals shown include unfiled device data.  Last Pain:  Vitals:   01/10/24 1015  TempSrc: Oral  PainSc:          Complications: There were no known notable events for this encounter.

## 2024-01-10 NOTE — Plan of Care (Signed)

## 2024-01-10 NOTE — Progress Notes (Signed)
   01/10/24 1217  Vitals  Temp 97.8 F (36.6 C)  Temp Source Oral  BP (!) 115/57  MAP (mmHg) 73  BP Location Right Arm  BP Method Automatic  Patient Position (if appropriate) Lying  Pulse Rate (!) 54  Pulse Rate Source Monitor  ECG Heart Rate (!) 55  Resp 14  MEWS COLOR  MEWS Score Color Green  Oxygen Therapy  SpO2 94 %  O2 Device Room Air  MEWS Score  MEWS Temp 0  MEWS Systolic 0  MEWS Pulse 0  MEWS RR 0  MEWS LOC 0  MEWS Score 0   Patient arrived from cath lab to 4e23, patient with right groin incision, [id number] no hematoma or bleeding noted at this time, patient aware of 4hrs of bed rest from 1000 am, provider at bedside. Vital signs obtained and CCMD made aware patient on box [id number]. Bedside RN aware. Sanaai Doane Jessup RN

## 2024-01-10 NOTE — Anesthesia Preprocedure Evaluation (Signed)
 Anesthesia Evaluation  Patient identified by MRN, date of birth, ID band Patient awake    Reviewed: Allergy & Precautions, NPO status , Patient's Chart, lab work & pertinent test results  Airway Mallampati: IV  TM Distance: >3 FB Neck ROM: Full    Dental  (+) Dental Advisory Given   Pulmonary sleep apnea and Continuous Positive Airway Pressure Ventilation , former smoker   breath sounds clear to auscultation       Cardiovascular hypertension, Pt. on medications + CAD  + dysrhythmias Atrial Fibrillation  Rhythm:Regular Rate:Normal     Neuro/Psych  Headaches  Neuromuscular disease    GI/Hepatic Neg liver ROS,GERD  ,,  Endo/Other  Hypothyroidism    Renal/GU negative Renal ROS     Musculoskeletal   Abdominal   Peds  Hematology negative hematology ROS (+)   Anesthesia Other Findings   Reproductive/Obstetrics                              Anesthesia Physical Anesthesia Plan  ASA: 3  Anesthesia Plan: General   Post-op Pain Management: Ofirmev  IV (intra-op)* and Minimal or no pain anticipated   Induction: Intravenous  PONV Risk Score and Plan: 2 and Dexamethasone , Ondansetron  and Treatment may vary due to age or medical condition  Airway Management Planned: Oral ETT  Additional Equipment: ClearSight  Intra-op Plan:   Post-operative Plan: Extubation in OR  Informed Consent: I have reviewed the patients History and Physical, chart, labs and discussed the procedure including the risks, benefits and alternatives for the proposed anesthesia with the patient or authorized representative who has indicated his/her understanding and acceptance.     Dental advisory given  Plan Discussed with: CRNA  Anesthesia Plan Comments:          Anesthesia Quick Evaluation

## 2024-01-10 NOTE — H&P (Signed)
 Electrophysiology Office Note:     Date:  01/10/2024    ID:  Ronte Parker, DOB Apr 05, 1951, MRN [id number]   [facility] Cardiologist:  Vina Gull, MD  [facility] Electrophysiologist:  OLE ONEIDA HOLTS, MD    Referring MD: Dayna Motto, DO    Chief Complaint: AF   History of Present Illness:     Mr. Mich is a 73 year old man who I am seeing today for an evaluation of atrial fibrillation.  The patient was most recently seen by [facility] in the A-fib clinic June 20, 2023.  The patient is followed by Dr. Gull in the outpatient setting.  He has a history of persistent atrial fibrillation, bradycardia, hypertension, sleep apnea on CPAP.  He also has a history of seizures, and left frontal brain mass.   I saw the patient in March 2023.  He has had a cardioversion in December 2023.  At the last appoint with Daril he expressed concern about being on long-term anticoagulation given the risks of bleeding.  He was interested in [place] implant.   He is here with his wife in clinic to have previously met.  They are very interested in pursuing left atrial appendage occlusion.  He wants to discontinue anticoagulation given the long-term bleeding risks.  He is currently taking Xarelto  20 mg by mouth daily.  Presents for LAAO today. Procedure reviewed.  Objective Their past medical, social and family history was reviewed.     ROS:   Please see the history of present illness.    All other systems reviewed and are negative.   EKGs/Labs/Other Studies Reviewed:     The following studies were reviewed today:   February 23, 2021 echo EF 55-60 RV function normal Trivial MR          Physical Exam:     VS:  BP 140/67   Pulse 54   Ht 5' 10.5 (1.791 m)   Wt 230 lb 9.6 oz (104.6 kg)   SpO2 97%   BMI 32.62 kg/m         Wt Readings from Last 3 Encounters:  07/26/23 230 lb 9.6 oz (104.6 kg)  06/20/23 231 lb (104.8 kg)  05/07/23 227 lb (103 kg)      GEN: no distress CARD: RRR,  No MRG RESP: No IWOB. CTAB.         Assessment ASSESSMENT AND PLAN:     1. Persistent atrial fibrillation (HCC)   2. Long term (current) use of anticoagulants       #Atrial fibrillation Currently on Xarelto  for stroke prophylaxis but is concerned about the long-term bleeding risk associated with this stroke risk mitigation strategy.  He is interested in Landscape architect which I think is reasonable.   I have seen Lorrene Gardner in the office today who is being considered for a Watchman left atrial appendage closure device. I believe they will benefit from this procedure given their history of atrial fibrillation, CHA2DS2-VASc score of 3 and unadjusted ischemic stroke rate of 3.2% per year. The patient's chart has been reviewed and I feel that they would be a candidate for short term oral anticoagulation after Watchman implant.    It is my belief that after undergoing a LAA closure procedure, Lorrene Gardner will not need long term anticoagulation which eliminates anticoagulation side effects and major bleeding risk.    Procedural risks for the Watchman implant have been reviewed with the patient including a 0.5% risk of stroke, <1% risk of perforation and <1% risk  of device embolization. Other risks include bleeding, vascular damage, tamponade, worsening renal function, and death. The patient understands these risk and wishes to proceed.       The published clinical data on the safety and effectiveness of WATCHMAN include but are not limited to the following: - Holmes DR, Jess BEARD, Sick P et al. for the PROTECT AF Investigators. Percutaneous closure of the left atrial appendage versus warfarin therapy for prevention of stroke in patients with atrial fibrillation: a randomised non-inferiority trial. Lancet 2009; 374: 534-42. GLENWOOD Jess BEARD, Doshi SK, Jonita VEAR Satchel D et al. on behalf of the PROTECT AF Investigators. Percutaneous Left Atrial Appendage Closure for Stroke Prophylaxis in Patients With  Atrial Fibrillation 2.3-Year Follow-up of the PROTECT AF (Watchman Left Atrial Appendage System for Embolic Protection in Patients With Atrial Fibrillation) Trial. Circulation 2013; 127:720-729. - Alli O, Doshi S,  Kar S, Reddy VY, Sievert H et al. Quality of Life Assessment in the Randomized PROTECT AF (Percutaneous Closure of the Left Atrial Appendage Versus Warfarin Therapy for Prevention of Stroke in Patients With Atrial Fibrillation) Trial of Patients at Risk for Stroke With Nonvalvular Atrial Fibrillation. J Am Coll Cardiol 2013; 61:1790-8. GLENWOOD Satchel DR, Archer RAMAN, Price M, Whisenant B, Sievert H, Doshi S, Huber K, Reddy V. Prospective randomized evaluation of the Watchman left atrial appendage Device in patients with atrial fibrillation versus long-term warfarin therapy; the PREVAIL trial. Journal of the [employer] of Cardiology, Vol. 4, No. 1, 2014, 1-11. - Kar S, Doshi SK, Sadhu A, Horton R, Osorio J et al. Primary outcome evaluation of a next-generation left atrial appendage closure device: results from the PINNACLE FLX trial. Circulation 2021;143(18)1754-1762.      After today's visit with the patient which was dedicated solely for shared decision making visit regarding LAA closure device, the patient decided to proceed with the LAA appendage closure procedure scheduled to be done in the near future at [facility]. Prior to the procedure, I would like to obtain a gated CT scan of the chest with contrast timed for PV/LA visualization.    Additionally, the patient will need an updated echo.   HAS-BLED score 2 Hypertension Yes  Abnormal renal and liver function (Dialysis, transplant, Cr >2.26 mg/dL /Cirrhosis or Bilirubin >2x Normal or AST/ALT/AP >3x Normal) No  Stroke No  Bleeding No  Labile INR (Unstable/high INR) No  Elderly (>65) Yes  Drugs or alcohol (>= 8 drinks/week, anti-plt or NSAID) No    CHA2DS2-VASc Score = 3  The patient's score is based upon: CHF History: 0 HTN  History: 1 Diabetes History: 0 Stroke History: 0 Vascular Disease History: 1 Age Score: 1 Gender Score: 0      Presents for LAAO today. Procedure reviewed.     Signed, Ole DASEN. Cindie, MD, [facility], [facility] 01/10/2024 Electrophysiology [facility]

## 2024-01-11 MED FILL — Fentanyl Citrate Preservative Free (PF) Inj 100 MCG/2ML: INTRAMUSCULAR | Qty: 2 | Status: AC

## 2024-01-15 ENCOUNTER — Telehealth: Payer: Self-pay

## 2024-01-15 DIAGNOSIS — Z95818 Presence of other cardiac implants and grafts: Secondary | ICD-10-CM

## 2024-01-15 DIAGNOSIS — I4819 Other persistent atrial fibrillation: Secondary | ICD-10-CM

## 2024-01-15 NOTE — Addendum Note (Signed)
 Addended by: Bryttani Blew A on: 01/15/2024 03:50 PM   Modules accepted: Orders

## 2024-01-15 NOTE — Anesthesia Postprocedure Evaluation (Signed)
 Anesthesia Post Note  Patient: Cory Mathis  Procedure(s) Performed: LEFT ATRIAL APPENDAGE OCCLUSION TRANSESOPHAGEAL ECHOCARDIOGRAM     Patient location during evaluation: PACU Anesthesia Type: General Level of consciousness: awake and alert Pain management: pain level controlled Vital Signs Assessment: post-procedure vital signs reviewed and stable Respiratory status: spontaneous breathing, nonlabored ventilation, respiratory function stable and patient connected to nasal cannula oxygen Cardiovascular status: blood pressure returned to baseline and stable Postop Assessment: no apparent nausea or vomiting Anesthetic complications: no   There were no known notable events for this encounter.  Last Vitals:  Vitals:   01/10/24 1100 01/10/24 1217  BP: 115/61 (!) 115/57  Pulse: (!) 52 (!) 54  Resp: 10 14  Temp:  36.6 C  SpO2: 94% 94%    Last Pain:  Vitals:   01/10/24 1247  TempSrc:   PainSc: 0-No pain                 Cory Mathis

## 2024-01-15 NOTE — Telephone Encounter (Signed)
  [facility]   Watchman Team  Contacted the patient regarding discharge from [facility] on 01/10/2024  The patient understands to follow up with Charlies Arthur on 9/12  The patient understands discharge instructions? Yes  The patient understands medications and regimen? Yes   The patient reports groin site looks bruised but otherwise healthy with no S/S of infection or active bleeding  The patient understands to call with any questions or concerns prior to scheduled visit.    The patient reported he went into afib after the procedure and is feeling weak. He called the Afib Clinic and was scheduled for evaluation on 01/21/2024. He is hoping to get a DCCV set up. He will call if he has any issues prior to that visit.

## 2024-01-18 DIAGNOSIS — H35433 Paving stone degeneration of retina, bilateral: Secondary | ICD-10-CM | POA: Diagnosis not present

## 2024-01-18 DIAGNOSIS — D3131 Benign neoplasm of right choroid: Secondary | ICD-10-CM | POA: Diagnosis not present

## 2024-01-18 DIAGNOSIS — D3132 Benign neoplasm of left choroid: Secondary | ICD-10-CM | POA: Diagnosis not present

## 2024-01-21 ENCOUNTER — Encounter (HOSPITAL_COMMUNITY): Payer: Self-pay | Admitting: Physician Assistant

## 2024-01-21 ENCOUNTER — Ambulatory Visit (HOSPITAL_COMMUNITY)
Admission: RE | Admit: 2024-01-21 | Discharge: 2024-01-21 | Disposition: A | Discharge status: Home / Self Care | Source: Ambulatory Visit | Attending: Physician Assistant | Admitting: Physician Assistant

## 2024-01-21 VITALS — BP 148/80 | HR 91 | Ht 71.0 in | Wt 237.4 lb

## 2024-01-21 DIAGNOSIS — D6869 Other thrombophilia: Secondary | ICD-10-CM

## 2024-01-21 DIAGNOSIS — I4819 Other persistent atrial fibrillation: Secondary | ICD-10-CM | POA: Insufficient documentation

## 2024-01-21 DIAGNOSIS — Z95818 Presence of other cardiac implants and grafts: Secondary | ICD-10-CM | POA: Diagnosis not present

## 2024-01-21 DIAGNOSIS — G4733 Obstructive sleep apnea (adult) (pediatric): Secondary | ICD-10-CM | POA: Diagnosis not present

## 2024-01-21 DIAGNOSIS — Z7901 Long term (current) use of anticoagulants: Secondary | ICD-10-CM | POA: Diagnosis not present

## 2024-01-21 DIAGNOSIS — I2510 Atherosclerotic heart disease of native coronary artery without angina pectoris: Secondary | ICD-10-CM | POA: Insufficient documentation

## 2024-01-21 DIAGNOSIS — Z79899 Other long term (current) drug therapy: Secondary | ICD-10-CM | POA: Diagnosis not present

## 2024-01-21 DIAGNOSIS — I10 Essential (primary) hypertension: Secondary | ICD-10-CM | POA: Insufficient documentation

## 2024-01-21 NOTE — Progress Notes (Addendum)
 Primary Care Physician: Dayna Motto, DO Referring Physician: Jodie Passey, PA Cardiologist: Dr. Okey  Primary EP: Dr Cindie Lorrene Gardner is a 73 y.o. male with a h/o persistent afib, bradycardia, HTN, OSA, treated with cpap, that is in the afib clinic s/p successful  cardioversion 02/08/21, scheduled by Jodie Passey.   EKG shows sinus brady at 54 bpm, not symptomatic with this.  Per [name]'s note, he preferred not to be on AAD's and would prefer ablation in the future if needed. He was not aware of the afib, just noted higher HR's on his apple watch and requested appointment with  Dr. Okey. He is already scheduled with Dr. Cindie 10/27. He had a basically normal echo in 2020 and is scheduled for a updated echo. He denies alcohol use, no tobacco, some caffeine, not excessive.    Follow up in the AF clinic 10/24/21. Patient reports that about 5 days ago he noted elevated heart rates on his Apple Watch and symptoms of sluggishness. He is in afib today with elevated heart rates. He has recently been treated for a respiratory infection with abx and increased prednisone .   F/u in afib clinic, 02/01/22,  as pt has developed persistent afib for the last 2 days associated with fatigue and lightheadedness. 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 He is now down 7.5 mg daily. No known triggers for afib. He saw Dr. Cindie in March and due to low afib burden, it was a wait and watch approach. He had afib in May, set up for cardioversion but spontaneously converted and now again in August. No missed xarelto .   Follow up in the AF clinic 05/25/22. Patient reports that one week ago he started having symptoms of fatigue and SOB. His smart watch has shown persistent afib since then. He did have an alcoholic drink just prior to the onset of his symptoms. No bleeding issues on anticoagulation.   Follow up in the AF clinic  06/15/22. Patient is s/p DCCV 06/01/22. He is back in SR with resolution of his symptoms.   Follow up in the AF clinic 06/20/23. Patient reports that he has done well since his last visit. He denies any interim symptoms of afib. He is concerned about his bleeding risk on anticoagulation.   Follow up 01/21/24. Patient returns for follow up for atrial fibrillation. He is s/p Watchman implant 01/10/24. He went out of rhythm the day after his procedure. He feels fatigued and anxious when in afib. No bleeding issues currently. His groin sites have healed from the procedure.   Today, he  denies symptoms of palpitations, chest pain, shortness of breath, orthopnea, PND, lower extremity edema, dizziness, presyncope, syncope, snoring, daytime somnolence, bleeding, or neurologic sequela. The patient is tolerating medications without difficulties and is otherwise without complaint today.    Past Medical History:  Diagnosis Date   Allergic rhinitis    Allergy    Atrial fibrillation with rapid ventricular response (HCC)    Benign neoplasm of right choroid    BPH (benign prostatic hyperplasia)    Brain tumor (benign) (HCC)    Cataract    bilateral,removed   Chest pain    Chronic headaches    Constipation    Diverticulosis of colon    Dysplastic nevus    Dysrhythmia 2019   Afib   Fatigue    Gastroesophageal reflux disease without esophagitis  GERD (gastroesophageal reflux disease)    prn med. 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     Cholecalciferol-Vitamin C (VITAMIN D3-VITAMIN C PO) Take 1 tablet by mouth daily.     diltiazem  (CARDIZEM  CD) 120 MG 24 hr capsule TAKE 1 CAPSULE(120 MG) BY MOUTH TWICE DAILY 180 capsule 2   EPINEPHrine  0.3 mg/0.3 mL IJ SOAJ injection Inject 0.3 mg into the muscle as needed for anaphylaxis.     eszopiclone (LUNESTA) 2 MG TABS tablet Take 2 mg by mouth at bedtime. Take immediately before bedtime     famotidine-calcium  carbonate-magnesium hydroxide (PEPCID COMPLETE) 10-800-165 MG chewable tablet Chew 1 tablet by mouth at bedtime as needed (acid reflux).     losartan  (COZAAR ) 50 MG tablet Take 1 tablet (50 mg total) by mouth daily. 90 tablet 3   predniSONE  (DELTASONE ) 10 MG tablet Take 10 mg by mouth daily.     predniSONE  (DELTASONE ) 5 MG tablet Take 7.5 mg by mouth daily with breakfast.     rivaroxaban  (XARELTO ) 20 MG TABS tablet Take 1 tablet (20 mg total) by mouth daily with supper. (Patient taking differently: Take 20 mg by mouth daily with breakfast.) 90 tablet 1   rosuvastatin  (CRESTOR ) 5 MG tablet TAKE 1/2 TABLET(2.5 MG) BY MOUTH DAILY 45 tablet 0   Vibegron (GEMTESA) 75 MG TABS Take 75 mg by mouth at bedtime.     No current facility-administered medications for this encounter.    ROS- All systems are reviewed and negative except as per the HPI above  Physical Exam: Vitals:   01/21/24 1424  BP: (!) 148/80  Pulse: 91  Weight: 107.7 kg  Height: 5' 11 (1.803 m)      Wt Readings from Last 3  Encounters:  01/21/24 107.7 kg  01/10/24 104.3 kg  12/20/23 102.5 kg    GEN: Well nourished, well developed in no acute distress CARDIAC: Irregularly irregular rate and rhythm, no murmurs, rubs, gallops RESPIRATORY:  Clear to auscultation without rales, wheezing or rhonchi  ABDOMEN: Soft, non-tender, non-distended EXTREMITIES:  No edema; No deformity    EKG today demonstrates Afib, RBBB Vent. rate 91 BPM PR interval * ms QRS duration 136 ms QT/QTcB 386/474 ms   CHA2DS2-VASc Score = 3  The patient's score is based upon: CHF History: 0 HTN History: 1 Diabetes History: 0 Stroke History:  0 Vascular Disease History: 1 Age Score: 1 Gender Score: 0       ASSESSMENT AND PLAN: Persistent Atrial Fibrillation (ICD10:  I48.19) The patient's CHA2DS2-VASc score is 3, indicating a 3.2% annual risk of stroke.   Patient back in persistent afib, started day after Watchman implant.  We discussed rhythm control options today. Will plan for TEE/DCCV at least 4 weeks post implant. Check bmet/cbc Continue diltiazem  120 mg BID Continue Xarelto  20 mg daily for now, he denies any missed doses. Will continue Xarelto  4 weeks post DCCV.  Apple Watch for home monitoring.   Secondary Hypercoagulable State (ICD10:  D68.69) The patient is at significant risk for stroke/thromboembolism based upon his CHA2DS2-VASc Score of 3.  Continue Rivaroxaban  (Xarelto ). S/p Watchman implant 01/10/24.  HTN Mildly elevated today, will reassess in SR.  CAD CAC score 186 No anginal symptoms Followed by Dr Okey  [facility]  Encouraged nightly CPAP   Follow up with Charlies Arthur as scheduled.    Informed Consent   Shared Decision Making/Informed Consent   The risks [stroke, cardiac arrhythmias rarely resulting in the need for a temporary or permanent pacemaker, skin irritation or burns, esophageal damage, perforation (1:10,000 risk), bleeding, pharyngeal hematoma as well as other potential complications associated  with conscious sedation including aspiration, arrhythmia, respiratory failure and death], benefits (treatment guidance, restoration of normal sinus rhythm, diagnostic support) and alternatives of a transesophageal echocardiogram guided cardioversion were discussed in detail with Mr. Pulse and he is willing to proceed.      Daril Kicks PA-C [facility] [address] [place], [place] [address] [phone]

## 2024-01-21 NOTE — H&P (View-Only) (Signed)
 Primary Care Physician: Dayna Motto, DO Referring Physician: Jodie Passey, PA Cardiologist: Dr. Okey  Primary EP: Dr Cindie Lorrene Gardner is a 73 y.o. male with a h/o persistent afib, bradycardia, HTN, OSA, treated with cpap, that is in the afib clinic s/p successful  cardioversion 02/08/21, scheduled by Jodie Passey.   EKG shows sinus brady at 54 bpm, not symptomatic with this.  Per [name]'s note, he preferred not to be on AAD's and would prefer ablation in the future if needed. He was not aware of the afib, just noted higher HR's on his apple watch and requested appointment with  Dr. Okey. He is already scheduled with Dr. Cindie 10/27. He had a basically normal echo in 2020 and is scheduled for a updated echo. He denies alcohol use, no tobacco, some caffeine, not excessive.    Follow up in the AF clinic 10/24/21. Patient reports that about 5 days ago he noted elevated heart rates on his Apple Watch and symptoms of sluggishness. He is in afib today with elevated heart rates. He has recently been treated for a respiratory infection with abx and increased prednisone .   F/u in afib clinic, 02/01/22,  as pt has developed persistent afib for the last 2 days associated with fatigue and lightheadedness. 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 He is now down 7.5 mg daily. No known triggers for afib. He saw Dr. Cindie in March and due to low afib burden, it was a wait and watch approach. He had afib in May, set up for cardioversion but spontaneously converted and now again in August. No missed xarelto .   Follow up in the AF clinic 05/25/22. Patient reports that one week ago he started having symptoms of fatigue and SOB. His smart watch has shown persistent afib since then. He did have an alcoholic drink just prior to the onset of his symptoms. No bleeding issues on anticoagulation.   Follow up in the AF clinic  06/15/22. Patient is s/p DCCV 06/01/22. He is back in SR with resolution of his symptoms.   Follow up in the AF clinic 06/20/23. Patient reports that he has done well since his last visit. He denies any interim symptoms of afib. He is concerned about his bleeding risk on anticoagulation.   Follow up 01/21/24. Patient returns for follow up for atrial fibrillation. He is s/p Watchman implant 01/10/24. He went out of rhythm the day after his procedure. He feels fatigued and anxious when in afib. No bleeding issues currently. His groin sites have healed from the procedure.   Today, he  denies symptoms of palpitations, chest pain, shortness of breath, orthopnea, PND, lower extremity edema, dizziness, presyncope, syncope, snoring, daytime somnolence, bleeding, or neurologic sequela. The patient is tolerating medications without difficulties and is otherwise without complaint today.    Past Medical History:  Diagnosis Date   Allergic rhinitis    Allergy    Atrial fibrillation with rapid ventricular response (HCC)    Benign neoplasm of right choroid    BPH (benign prostatic hyperplasia)    Brain tumor (benign) (HCC)    Cataract    bilateral,removed   Chest pain    Chronic headaches    Constipation    Diverticulosis of colon    Dysplastic nevus    Dysrhythmia 2019   Afib   Fatigue    Gastroesophageal reflux disease without esophagitis  GERD (gastroesophageal reflux disease)    prn med. 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     Cholecalciferol-Vitamin C (VITAMIN D3-VITAMIN C PO) Take 1 tablet by mouth daily.     diltiazem  (CARDIZEM  CD) 120 MG 24 hr capsule TAKE 1 CAPSULE(120 MG) BY MOUTH TWICE DAILY 180 capsule 2   EPINEPHrine  0.3 mg/0.3 mL IJ SOAJ injection Inject 0.3 mg into the muscle as needed for anaphylaxis.     eszopiclone (LUNESTA) 2 MG TABS tablet Take 2 mg by mouth at bedtime. Take immediately before bedtime     famotidine-calcium  carbonate-magnesium hydroxide (PEPCID COMPLETE) 10-800-165 MG chewable tablet Chew 1 tablet by mouth at bedtime as needed (acid reflux).     losartan  (COZAAR ) 50 MG tablet Take 1 tablet (50 mg total) by mouth daily. 90 tablet 3   predniSONE  (DELTASONE ) 10 MG tablet Take 10 mg by mouth daily.     predniSONE  (DELTASONE ) 5 MG tablet Take 7.5 mg by mouth daily with breakfast.     rivaroxaban  (XARELTO ) 20 MG TABS tablet Take 1 tablet (20 mg total) by mouth daily with supper. (Patient taking differently: Take 20 mg by mouth daily with breakfast.) 90 tablet 1   rosuvastatin  (CRESTOR ) 5 MG tablet TAKE 1/2 TABLET(2.5 MG) BY MOUTH DAILY 45 tablet 0   Vibegron (GEMTESA) 75 MG TABS Take 75 mg by mouth at bedtime.     No current facility-administered medications for this encounter.    ROS- All systems are reviewed and negative except as per the HPI above  Physical Exam: Vitals:   01/21/24 1424  BP: (!) 148/80  Pulse: 91  Weight: 107.7 kg  Height: 5' 11 (1.803 m)      Wt Readings from Last 3  Encounters:  01/21/24 107.7 kg  01/10/24 104.3 kg  12/20/23 102.5 kg    GEN: Well nourished, well developed in no acute distress CARDIAC: Irregularly irregular rate and rhythm, no murmurs, rubs, gallops RESPIRATORY:  Clear to auscultation without rales, wheezing or rhonchi  ABDOMEN: Soft, non-tender, non-distended EXTREMITIES:  No edema; No deformity    EKG today demonstrates Afib, RBBB Vent. rate 91 BPM PR interval * ms QRS duration 136 ms QT/QTcB 386/474 ms   CHA2DS2-VASc Score = 3  The patient's score is based upon: CHF History: 0 HTN History: 1 Diabetes History: 0 Stroke History:  0 Vascular Disease History: 1 Age Score: 1 Gender Score: 0       ASSESSMENT AND PLAN: Persistent Atrial Fibrillation (ICD10:  I48.19) The patient's CHA2DS2-VASc score is 3, indicating a 3.2% annual risk of stroke.   Patient back in persistent afib, started day after Watchman implant.  We discussed rhythm control options today. Will plan for TEE/DCCV at least 4 weeks post implant. Check bmet/cbc Continue diltiazem  120 mg BID Continue Xarelto  20 mg daily for now, he denies any missed doses. Will continue Xarelto  4 weeks post DCCV.  Apple Watch for home monitoring.   Secondary Hypercoagulable State (ICD10:  D68.69) The patient is at significant risk for stroke/thromboembolism based upon his CHA2DS2-VASc Score of 3.  Continue Rivaroxaban  (Xarelto ). S/p Watchman implant 01/10/24.  HTN Mildly elevated today, will reassess in SR.  CAD CAC score 186 No anginal symptoms Followed by Dr Okey  OSA  Encouraged nightly CPAP   Follow up with Charlies Arthur as scheduled.    Informed Consent   Shared Decision Making/Informed Consent   The risks [stroke, cardiac arrhythmias rarely resulting in the need for a temporary or permanent pacemaker, skin irritation or burns, esophageal damage, perforation (1:10,000 risk), bleeding, pharyngeal hematoma as well as other potential complications associated  with conscious sedation including aspiration, arrhythmia, respiratory failure and death], benefits (treatment guidance, restoration of normal sinus rhythm, diagnostic support) and alternatives of a transesophageal echocardiogram guided cardioversion were discussed in detail with Mr. Pulse and he is willing to proceed.      Daril Kicks PA-C [facility] [address] [place], [place] [address] [phone]

## 2024-01-21 NOTE — Patient Instructions (Addendum)
 Cardioversion scheduled for: Wednesday, August 13th   - Arrive at the [facility] ([address])  and check in with ADMITTING at 10:00 AM   - Do not eat or drink anything after midnight the night prior to your procedure.   - Take all your morning medication (except diabetic medications) with a sip of water prior to arrival.  - Do NOT miss any doses of your blood thinner - if you should miss a dose or take a dose more than 4 hours late -- please notify our office immediately.  - You will not be able to drive home after your procedure. Please ensure you have a responsible adult to drive you home. You will need someone with you for 24 hours post procedure.     - Expect to be in the procedural area approximately 2 hours.   - If you feel as if you go back into normal rhythm prior to scheduled cardioversion, please notify our office immediately.   If your procedure is canceled in the cardioversion suite you will be charged a cancellation fee.

## 2024-01-22 ENCOUNTER — Ambulatory Visit (HOSPITAL_COMMUNITY): Payer: Self-pay | Admitting: Physician Assistant

## 2024-01-22 DIAGNOSIS — Z85828 Personal history of other malignant neoplasm of skin: Secondary | ICD-10-CM | POA: Diagnosis not present

## 2024-01-22 DIAGNOSIS — L820 Inflamed seborrheic keratosis: Secondary | ICD-10-CM | POA: Diagnosis not present

## 2024-01-22 LAB — CBC
Hematocrit: 45 % (ref 37.5–51.0)
Hemoglobin: 15 g/dL (ref 13.0–17.7)
MCH: 33.4 pg — ABNORMAL HIGH (ref 26.6–33.0)
MCHC: 33.3 g/dL (ref 31.5–35.7)
MCV: 100 fL — ABNORMAL HIGH (ref 79–97)
Platelets: 337 x10E3/uL (ref 150–450)
RBC: 4.49 x10E6/uL (ref 4.14–5.80)
RDW: 12.7 % (ref 11.6–15.4)
WBC: 13 x10E3/uL — ABNORMAL HIGH (ref 3.4–10.8)

## 2024-01-22 LAB — BASIC METABOLIC PANEL WITH GFR
BUN/Creatinine Ratio: 11 (ref 10–24)
BUN: 10 mg/dL (ref 8–27)
CO2: 23 mmol/L (ref 20–29)
Calcium: 9.6 mg/dL (ref 8.6–10.2)
Chloride: 104 mmol/L (ref 96–106)
Creatinine, Ser: 0.93 mg/dL (ref 0.76–1.27)
Glucose: 102 mg/dL — ABNORMAL HIGH (ref 70–99)
Potassium: 4.6 mmol/L (ref 3.5–5.2)
Sodium: 141 mmol/L (ref 134–144)
eGFR: 86 mL/min/1.73 (ref >59)

## 2024-01-23 ENCOUNTER — Emergency Department (HOSPITAL_COMMUNITY)

## 2024-01-23 ENCOUNTER — Emergency Department (HOSPITAL_COMMUNITY)
Admission: EM | Admit: 2024-01-23 | Discharge: 2024-01-23 | Disposition: A | Discharge status: Home / Self Care | Attending: Emergency Medicine | Admitting: Emergency Medicine

## 2024-01-23 ENCOUNTER — Encounter (HOSPITAL_COMMUNITY): Payer: Self-pay

## 2024-01-23 DIAGNOSIS — Y93B9 Activity, other involving muscle strengthening exercises: Secondary | ICD-10-CM | POA: Diagnosis not present

## 2024-01-23 DIAGNOSIS — S0181XA Laceration without foreign body of other part of head, initial encounter: Secondary | ICD-10-CM | POA: Diagnosis not present

## 2024-01-23 DIAGNOSIS — W208XXA Other cause of strike by thrown, projected or falling object, initial encounter: Secondary | ICD-10-CM | POA: Diagnosis not present

## 2024-01-23 DIAGNOSIS — S0083XA Contusion of other part of head, initial encounter: Secondary | ICD-10-CM

## 2024-01-23 DIAGNOSIS — Z7901 Long term (current) use of anticoagulants: Secondary | ICD-10-CM | POA: Insufficient documentation

## 2024-01-23 DIAGNOSIS — Z79899 Other long term (current) drug therapy: Secondary | ICD-10-CM | POA: Insufficient documentation

## 2024-01-23 DIAGNOSIS — S0990XA Unspecified injury of head, initial encounter: Secondary | ICD-10-CM | POA: Diagnosis not present

## 2024-01-23 NOTE — ED Triage Notes (Signed)
 Pt arrived via POV, states hit self in head with weight. Lac to above left eye. No LOC, no headache or vision issues. On thinners.

## 2024-01-23 NOTE — Group Note (Deleted)
 Date:  01/23/2024 Time:  2:22 PM  Group Topic/Focus:  Wellness Toolbox:   The focus of this group is to discuss various aspects of wellness, balancing those aspects and exploring ways to increase the ability to experience wellness.  Patients will create a wellness toolbox for use upon discharge.     Participation Level:  {BHH PARTICIPATION OZCZO:77735}  Participation Quality:  {BHH PARTICIPATION QUALITY:22265}  Affect:  {BHH AFFECT:22266}  Cognitive:  {BHH COGNITIVE:22267}  Insight: {BHH Insight2:20797}  Engagement in Group:  {BHH ENGAGEMENT IN HMNLE:77731}  Modes of Intervention:  {BHH MODES OF INTERVENTION:22269}  Additional Comments:  ***  Myra Curtistine BROCKS 01/23/2024, 2:22 PM

## 2024-01-23 NOTE — ED Provider Triage Note (Signed)
 Emergency Medicine Provider Triage Evaluation Note  Cory Mathis , a 73 y.o. male  was evaluated in triage.  Pt complains of head injury.  Patient accidentally hit himself in the head with the weight above his left eye.  Patient is on anticoagulation for atrial fibrillation.  Patient denies any loss of consciousness.  Review of Systems  Positive: laceration Negative: Loc, neck pain  Physical Exam  BP 135/87 (BP Location: Left Arm)   Pulse 84   Temp 98.3 F (36.8 C) (Oral)   Resp 18   SpO2 99%  Gen:   Awake, no distress   Resp:  Normal effort  MSK:   Moves extremities without difficulty  Other:  Less than 1 cm stellate lac, wound edges approximated above left eye  Medical Decision Making  Medically screening exam initiated at 12:05 PM.  Appropriate orders placed.  Cory Mathis was informed that the remainder of the evaluation will be completed by another provider, this initial triage assessment does not replace that evaluation, and the importance of remaining in the ED until their evaluation is complete.  With his anticoagulation and head injury  will proceed with CT   Randol Simmonds, MD 01/23/24 1206

## 2024-01-23 NOTE — ED Provider Notes (Signed)
 [place] [facility]GENC[facility] DEPARTMENT AT [facility] Provider Note   CSN: [id number] Arrival date & time: 01/23/24  1120     Patient presents with: Head Injury   Cory Mathis is a 73 y.o. male.   Pt complains of hitting himself in the head with a free weight.  Pt reports he was exercising and hit himself.  Pt reports no loss of consciousness.  Pt is on xarelto  for atrial fibrillation.  Pt seen at urgent care and sent here for evaluation.  Patient denies any loss of consciousness.  Patient complains of pain at the site and bleeding from a laceration.  Patient denies any dizziness he has not had any vision changes he has not had any hearing changes.  Patient is here with wife who reports other than pain patient has seen normal.  The history is provided by the patient. No language interpreter was used.  Head Injury Location:  L temporal Mechanism of injury: self-inflicted   Pain details:    Quality:  Aching   Severity:  Moderate   Timing:  Constant Relieved by:  Nothing Worsened by:  Nothing Ineffective treatments:  None tried Associated symptoms: no double vision, no nausea and no vomiting        Prior to Admission medications   Medication Sig Start Date End Date Taking? Authorizing Provider  Carboxymethylcellul-Glycerin (LUBRICATING EYE DROPS OP) Place 1 drop into both eyes daily as needed (dry eyes).    [provider]  Cholecalciferol-Vitamin C (VITAMIN D3-VITAMIN C PO) Take 1 tablet by mouth daily.    [provider]  diltiazem  (CARDIZEM  CD) 120 MG 24 hr capsule TAKE 1 CAPSULE(120 MG) BY MOUTH TWICE DAILY 11/20/23   Okey Vina GAILS, MD  EPINEPHrine  0.3 mg/0.3 mL IJ SOAJ injection Inject 0.3 mg into the muscle as needed for anaphylaxis. 08/11/20   [provider]  eszopiclone (LUNESTA) 2 MG TABS tablet Take 2 mg by mouth at bedtime. Take immediately before bedtime    [provider]  famotidine-calcium  carbonate-magnesium hydroxide (PEPCID  COMPLETE) [phone] MG chewable tablet Chew 1 tablet by mouth at bedtime as needed (acid reflux).    [provider]  losartan  (COZAAR ) 50 MG tablet Take 1 tablet (50 mg total) by mouth daily. 04/10/23   Okey Vina GAILS, MD  predniSONE  (DELTASONE ) 10 MG tablet Take 10 mg by mouth daily.    [provider]  predniSONE  (DELTASONE ) 5 MG tablet Take 7.5 mg by mouth daily with breakfast. 12/25/23 03/24/24  [provider]  rivaroxaban  (XARELTO ) 20 MG TABS tablet Take 1 tablet (20 mg total) by mouth daily with supper. Patient taking differently: Take 20 mg by mouth daily with breakfast. 12/24/23   Okey Vina GAILS, MD  rosuvastatin  (CRESTOR ) 5 MG tablet TAKE 1/2 TABLET(2.5 MG) BY MOUTH DAILY 08/28/23   Okey Vina GAILS, MD  Vibegron (GEMTESA) 75 MG TABS Take 75 mg by mouth at bedtime. 03/02/22   [provider]    Allergies: Other, Sulfa antibiotics, Ambrosia artemisiifolia (ragweed) skin test, and Flagyl  [metronidazole ]    Review of Systems  Eyes:  Negative for double vision.  Gastrointestinal:  Negative for nausea and vomiting.  All other systems reviewed and are negative.   Updated Vital Signs BP 135/87 (BP Location: Left Arm)   Pulse 84   Temp 98.3 F (36.8 C) (Oral)   Resp 18   SpO2 99%   Physical Exam Vitals and nursing note reviewed.  Constitutional:      Appearance:  He is well-developed.  HENT:     Head: Normocephalic.     Comments: 3 mm superficial laceration left forehead, no gaping.    Right Ear: External ear normal.     Left Ear: External ear normal.     Mouth/Throat:     Mouth: Mucous membranes are moist.  Eyes:     Extraocular Movements: Extraocular movements intact.     Pupils: Pupils are equal, round, and reactive to light.  Cardiovascular:     Rate and Rhythm: Normal rate.  Pulmonary:     Effort: Pulmonary effort is normal.  Abdominal:     General: There is no distension.  Musculoskeletal:        General: Normal range of motion.      Cervical back: Normal range of motion.  Skin:    General: Skin is warm.  Neurological:     General: No focal deficit present.     Mental Status: He is alert and oriented to person, place, and time.  Psychiatric:        Mood and Affect: Mood normal.     (all labs ordered are listed, but only abnormal results are displayed) Labs Reviewed - No data to display  EKG: None  Radiology: CT Head Wo Contrast Result Date: 01/23/2024 EXAM: CT HEAD WITHOUT CONTRAST 01/23/2024 02:11:23 PM TECHNIQUE: CT of the head was performed without the administration of intravenous contrast. Automated exposure control, iterative reconstruction, and/or weight based adjustment of the mA/kV was utilized to reduce the radiation dose to as low as reasonably achievable. COMPARISON: MRI head 01/07/2021 and CT head 05/07/2023. CLINICAL HISTORY: Head trauma, minor (Age >= 65y). Hit self in head with weight. FINDINGS: BRAIN AND VENTRICLES: No acute hemorrhage. Gray-white differentiation is preserved. No hydrocephalus. No extra-axial collection. No mass effect or midline shift. Redemonstrated hypoattenuation in the left basal ganglia and corona radiata corresponding to findings on prior studies. Biopsy tract in the left frontal lobe. Nonspecific hypoattenuation in the periventricular and subcortical white matter, most likely representing chronic small vessel disease. ORBITS: Bilateral lens replacement. SINUSES: Mild mucosal thickening in the ethmoid sinuses. SOFT TISSUES AND SKULL: No acute soft tissue abnormality. No skull fracture. IMPRESSION: 1. No acute intracranial abnormality. 2. Redemonstrated hypoattenuation in the left basal ganglia and corona radiata corresponding to findings on prior MRIs. 3. Chronic small vessel disease. Electronically signed by: Donnice Mania MD 01/23/2024 02:24 PM EDT RP Workstation: [id number]     .Laceration Repair  Date/Time: 01/23/2024 11:09 PM  Performed by: Flint Sonny POUR, PA-C Authorized  by: Flint Sonny POUR, PA-C   Consent:    Consent obtained:  Verbal   Consent given by:  Patient   Risks discussed:  Infection   Alternatives discussed:  No treatment Universal protocol:    Procedure explained and questions answered to patient or proxy's satisfaction: yes     Immediately prior to procedure, a time out was called: yes     Patient identity confirmed:  Verbally with patient Laceration details:    Location:  Face   Face location:  Forehead   Length (cm):  0.3 Treatment:    Area cleansed with:  Shur-Clens   Amount of cleaning:  Standard   Visualized foreign bodies/material removed: no     Debridement:  None Skin repair:    Repair method:  Tissue adhesive Repair type:    Repair type:  Simple Post-procedure details:    Procedure completion:  Tolerated    Medications Ordered in the ED - No data  to display                                  Medical Decision Making Patient complains of hitting himself in the left side of the head with a weight.  Patient is currently on a blood thinner.  Amount and/or Complexity of Data Reviewed Independent Historian: spouse    Details: Patient is here with his spouse who is supportive Radiology: ordered and independent interpretation performed. Decision-making details documented in ED Course.    Details: CT head no acute findings  Risk OTC drugs. Risk Details: Patient counseled on results.  Patient is advised Tylenol  or ibuprofen  for discomfort.  Patient counseled on Dermabond        Final diagnoses:  Laceration of forehead, initial encounter  Contusion of forehead, initial encounter    ED Discharge Orders     None      An After Visit Summary was printed and given to the patient.     Flint Sonny POUR, PA-C 01/23/24 2310    Geraldene Hamilton, MD 01/24/24 [phone]

## 2024-01-23 NOTE — Discharge Instructions (Signed)
 Return if any problems.

## 2024-01-28 DIAGNOSIS — J301 Allergic rhinitis due to pollen: Secondary | ICD-10-CM | POA: Diagnosis not present

## 2024-01-28 DIAGNOSIS — J3089 Other allergic rhinitis: Secondary | ICD-10-CM | POA: Diagnosis not present

## 2024-01-28 DIAGNOSIS — J3081 Allergic rhinitis due to animal (cat) (dog) hair and dander: Secondary | ICD-10-CM | POA: Diagnosis not present

## 2024-02-06 NOTE — Progress Notes (Signed)
 Spoke to patient and instructed them to come at 0845  and to be NPO after 0000.     Confirmed that patient will have a ride home and someone to stay with them for 24 hours after the procedure.   Medications reviewed.  Confirmed blood thinner.  Confirmed no breaks in taking blood thinner for 3+ weeks prior to procedure.

## 2024-02-07 ENCOUNTER — Other Ambulatory Visit: Payer: Self-pay

## 2024-02-07 ENCOUNTER — Ambulatory Visit (HOSPITAL_BASED_OUTPATIENT_CLINIC_OR_DEPARTMENT_OTHER)
Admission: RE | Admit: 2024-02-07 | Discharge: 2024-02-07 | Disposition: A | Discharge status: Home / Self Care | Source: Ambulatory Visit | Attending: Physician Assistant | Admitting: Physician Assistant

## 2024-02-07 ENCOUNTER — Encounter (HOSPITAL_COMMUNITY)
Admission: RE | Disposition: A | Discharge status: Home / Self Care | Payer: Self-pay | Source: Home / Self Care | Attending: Cardiovascular Disease

## 2024-02-07 ENCOUNTER — Encounter (HOSPITAL_COMMUNITY): Payer: Self-pay | Admitting: Cardiovascular Disease

## 2024-02-07 ENCOUNTER — Ambulatory Visit (HOSPITAL_COMMUNITY)

## 2024-02-07 ENCOUNTER — Ambulatory Visit (HOSPITAL_COMMUNITY)
Admission: RE | Admit: 2024-02-07 | Discharge: 2024-02-07 | Disposition: A | Discharge status: Home / Self Care | Attending: Cardiovascular Disease | Admitting: Cardiovascular Disease

## 2024-02-07 DIAGNOSIS — I4819 Other persistent atrial fibrillation: Secondary | ICD-10-CM | POA: Insufficient documentation

## 2024-02-07 DIAGNOSIS — Z95818 Presence of other cardiac implants and grafts: Secondary | ICD-10-CM | POA: Diagnosis not present

## 2024-02-07 DIAGNOSIS — E039 Hypothyroidism, unspecified: Secondary | ICD-10-CM | POA: Diagnosis not present

## 2024-02-07 DIAGNOSIS — D6869 Other thrombophilia: Secondary | ICD-10-CM | POA: Diagnosis not present

## 2024-02-07 DIAGNOSIS — I10 Essential (primary) hypertension: Secondary | ICD-10-CM

## 2024-02-07 DIAGNOSIS — G4733 Obstructive sleep apnea (adult) (pediatric): Secondary | ICD-10-CM | POA: Insufficient documentation

## 2024-02-07 DIAGNOSIS — Z79899 Other long term (current) drug therapy: Secondary | ICD-10-CM | POA: Diagnosis not present

## 2024-02-07 DIAGNOSIS — K219 Gastro-esophageal reflux disease without esophagitis: Secondary | ICD-10-CM | POA: Diagnosis not present

## 2024-02-07 DIAGNOSIS — I4891 Unspecified atrial fibrillation: Secondary | ICD-10-CM | POA: Diagnosis not present

## 2024-02-07 DIAGNOSIS — I2510 Atherosclerotic heart disease of native coronary artery without angina pectoris: Secondary | ICD-10-CM | POA: Diagnosis not present

## 2024-02-07 DIAGNOSIS — Z87891 Personal history of nicotine dependence: Secondary | ICD-10-CM | POA: Insufficient documentation

## 2024-02-07 DIAGNOSIS — Z7901 Long term (current) use of anticoagulants: Secondary | ICD-10-CM | POA: Insufficient documentation

## 2024-02-07 HISTORY — PX: TRANSESOPHAGEAL ECHOCARDIOGRAM (CATH LAB): EP1270

## 2024-02-07 HISTORY — PX: CARDIOVERSION: EP1203

## 2024-02-07 LAB — ECHO TEE

## 2024-02-07 SURGERY — TRANSESOPHAGEAL ECHOCARDIOGRAM (TEE) (CATHLAB)
Anesthesia: General

## 2024-02-07 MED ORDER — PROPOFOL 10 MG/ML IV BOLUS
INTRAVENOUS | Status: DC | PRN
  Administered 2024-02-07: 50 mg via INTRAVENOUS
  Administered 2024-02-07: 30 mg via INTRAVENOUS
  Administered 2024-02-07: 20 mg via INTRAVENOUS

## 2024-02-07 MED ORDER — SODIUM CHLORIDE 0.9 % IV SOLN: INTRAVENOUS | Status: DC | PRN

## 2024-02-07 MED ORDER — PROPOFOL 500 MG/50ML IV EMUL
INTRAVENOUS | Status: DC | PRN
  Administered 2024-02-07: 75 ug/kg/min via INTRAVENOUS

## 2024-02-07 MED ORDER — LIDOCAINE 2% (20 MG/ML) 5 ML SYRINGE
INTRAMUSCULAR | Status: DC | PRN
  Administered 2024-02-07: 40 mg via INTRAVENOUS

## 2024-02-07 SURGICAL SUPPLY — 1 items: PAD DEFIB RADIO PHYSIO CONN (PAD) ×1 IMPLANT

## 2024-02-07 NOTE — Progress Notes (Signed)
  Echocardiogram Echocardiogram Transesophageal has been performed.  Cory Mathis, RDCS 02/07/2024, 10:12 AM

## 2024-02-07 NOTE — Transfer of Care (Signed)
 Immediate Anesthesia Transfer of Care Note  Patient: Cory Mathis  Procedure(s) Performed: TRANSESOPHAGEAL ECHOCARDIOGRAM CARDIOVERSION  Patient Location: Cath Lab  Anesthesia Type:General  Level of Consciousness: awake, alert , oriented, and patient cooperative  Airway & Oxygen Therapy: Patient Spontanous Breathing  Post-op Assessment: Post -op Vital signs reviewed and stable and Patient moving all extremities X 4  Post vital signs: Reviewed and stable  Last Vitals:  Vitals Value Taken Time  BP 115/64   Temp 36.3 C 02/07/24 10:06  Pulse 70 02/07/24 10:06  Resp 17 02/07/24 10:06  SpO2 93 % 02/07/24 10:06    Last Pain:  Vitals:   02/07/24 1006  TempSrc: Tympanic  PainSc: Asleep         Complications: No notable events documented.

## 2024-02-07 NOTE — Anesthesia Preprocedure Evaluation (Addendum)
 Anesthesia Evaluation  Patient identified by MRN, date of birth, ID band Patient awake    Reviewed: Allergy & Precautions, NPO status , Patient's Chart, lab work & pertinent test results  Airway Mallampati: II  TM Distance: >3 FB Neck ROM: Full    Dental  (+) Teeth Intact, Dental Advisory Given   Pulmonary sleep apnea and Continuous Positive Airway Pressure Ventilation , former smoker   Pulmonary exam normal breath sounds clear to auscultation       Cardiovascular hypertension, Pt. on medications + CAD  + dysrhythmias Atrial Fibrillation  Rhythm:Irregular Rate:Abnormal  S/p Watchmann   Neuro/Psych  Headaches Brain tumor  Neuromuscular disease    GI/Hepatic ,GERD  Medicated,,(+)     substance abuse  alcohol use  Endo/Other  Hypothyroidism  Obesity   Renal/GU negative Renal ROS     Musculoskeletal negative musculoskeletal ROS (+)    Abdominal   Peds  Hematology  (+) Blood dyscrasia (Xarelto )   Anesthesia Other Findings Day of surgery medications reviewed with the patient.  Reproductive/Obstetrics                              Anesthesia Physical Anesthesia Plan  ASA: 3  Anesthesia Plan: General   Post-op Pain Management: Minimal or no pain anticipated   Induction: Intravenous  PONV Risk Score and Plan: 2 and TIVA and Treatment may vary due to age or medical condition  Airway Management Planned: Mask  Additional Equipment:   Intra-op Plan:   Post-operative Plan:   Informed Consent: I have reviewed the patients History and Physical, chart, labs and discussed the procedure including the risks, benefits and alternatives for the proposed anesthesia with the patient or authorized representative who has indicated his/her understanding and acceptance.     Dental advisory given  Plan Discussed with: CRNA  Anesthesia Plan Comments:          Anesthesia Quick  Evaluation

## 2024-02-07 NOTE — Anesthesia Postprocedure Evaluation (Signed)
 Anesthesia Post Note  Patient: Cory Mathis  Procedure(s) Performed: TRANSESOPHAGEAL ECHOCARDIOGRAM CARDIOVERSION     Patient location during evaluation: Cath Lab Anesthesia Type: General Level of consciousness: awake and alert Pain management: pain level controlled Vital Signs Assessment: post-procedure vital signs reviewed and stable Respiratory status: spontaneous breathing, nonlabored ventilation and respiratory function stable Cardiovascular status: blood pressure returned to baseline and stable Postop Assessment: no apparent nausea or vomiting Anesthetic complications: no   No notable events documented.  Last Vitals:  Vitals:   02/07/24 1016 02/07/24 1026  BP: 113/69 112/71  Pulse: 69 68  Resp: 14 11  Temp:    SpO2: 94% 96%    Last Pain:  Vitals:   02/07/24 1026  TempSrc:   PainSc: 0-No pain                 Garnette FORBES Skillern

## 2024-02-07 NOTE — Interval H&P Note (Signed)
 History and Physical Interval Note:  02/07/2024 9:23 AM  Cory Mathis  has presented today for surgery, with the diagnosis of AFIB.  The various methods of treatment have been discussed with the patient and family. After consideration of risks, benefits and other options for treatment, the patient has consented to  Procedure(s): TRANSESOPHAGEAL ECHOCARDIOGRAM (N/A) CARDIOVERSION (N/A) as a surgical intervention.  The patient's history has been reviewed, patient examined, no change in status, stable for surgery.  I have reviewed the patient's chart and labs.  Questions were answered to the patient's satisfaction.     Annabella Scarce, MD

## 2024-02-07 NOTE — CV Procedure (Addendum)
 Brief TEE Note:  LVEF 55-60% No LA/LAA thrombus Well-seated Watchman device with no evidence of leak. Trivial MR, TR, AR.  For additional details see full report.   Electrical Cardioversion Procedure Note Cory Mathis [id number] 09-Oct-1950  Procedure: Electrical Cardioversion Indications:  Atrial Fibrillation  Procedure Details Consent: Risks of procedure as well as the alternatives and risks of each were explained to the (patient/caregiver).  Consent for procedure obtained. Time Out: Verified patient identification, verified procedure, site/side was marked, verified correct patient position, special equipment/implants available, medications/allergies/relevent history reviewed, required imaging and test results available.  Performed  Patient placed on cardiac monitor, pulse oximetry, supplemental oxygen as necessary.  Sedation given: propofol  Pacer pads placed anterior and posterior chest.  Cardioverted 1 time(s).  Cardioverted at 200J.  Evaluation Findings: Post procedure EKG shows: NSR Complications: None Patient did tolerate procedure well.   Annabella Scarce, MD 02/07/2024, 9:57 AM

## 2024-02-07 NOTE — Discharge Instructions (Signed)
Electrical Cardioversion Electrical cardioversion is the delivery of a jolt of electricity to restore a normal rhythm to the heart. A rhythm that is too fast or is not regular keeps the heart from pumping well. In this procedure, sticky patches or metal paddles are placed on the chest to deliver electricity to the heart from a device. This procedure may be done in an emergency if: There is low or no blood pressure as a result of the heart rhythm. Normal rhythm must be restored as fast as possible to protect the brain and heart from further damage. It may save a life. This may also be a scheduled procedure for irregular or fast heart rhythms that are not immediately life-threatening.  What can I expect after the procedure? Your blood pressure, heart rate, breathing rate, and blood oxygen level will be monitored until you leave the hospital or clinic. Your heart rhythm will be watched to make sure it does not change. You may have some redness on the skin where the shocks were given. Over the counter cortizone cream may be helpful.  Follow these instructions at home: Do not drive for 24 hours if you were given a sedative during your procedure. Take over-the-counter and prescription medicines only as told by your health care provider. Ask your health care provider how to check your pulse. Check it often. Rest for 48 hours after the procedure or as told by your health care provider. Avoid or limit your caffeine use as told by your health care provider. Keep all follow-up visits as told by your health care provider. This is important. Contact a health care provider if: You feel like your heart is beating too quickly or your pulse is not regular. You have a serious muscle cramp that does not go away. Get help right away if: You have discomfort in your chest. You are dizzy or you feel faint. You have trouble breathing or you are short of breath. Your speech is slurred. You have trouble moving an  arm or leg on one side of your body. Your fingers or toes turn cold or blue. Summary Electrical cardioversion is the delivery of a jolt of electricity to restore a normal rhythm to the heart. This procedure may be done right away in an emergency or may be a scheduled procedure if the condition is not an emergency. Generally, this is a safe procedure. After the procedure, check your pulse often as told by your health care provider. This information is not intended to replace advice given to you by your health care provider. Make sure you discuss any questions you have with your health care provider. Document Revised: 12/30/2018 Document Reviewed: 12/30/2018 Elsevier Patient Education  2020 [employer] TEE  YOU HAD AN CARDIAC PROCEDURE TODAY: Refer to the procedure report and other information in the discharge instructions given to you for any specific questions about what was found during the examination. If this information does not answer your questions, please call [facility] office at [phone] to clarify.   DIET: Your first meal following the procedure should be a light meal and then it is ok to progress to your normal diet. A half-sandwich or bowl of soup is an example of a good first meal. Heavy or fried foods are harder to digest and may make you feel nauseous or bloated. Drink plenty of fluids but you should avoid alcoholic beverages for 24 hours. If you had a esophageal dilation, please see attached instructions for diet.   ACTIVITY: Your   care partner should take you home directly after the procedure. You should plan to take it easy, moving slowly for the rest of the day. You can resume normal activity the day after the procedure however YOU SHOULD NOT DRIVE, use power tools, machinery or perform tasks that involve climbing or major physical exertion for 24 hours (because of the sedation medicines used during the test).   SYMPTOMS TO REPORT IMMEDIATELY: A cardiologist can be  reached at any hour. Please call [phone] for any of the following symptoms:  Vomiting of blood or coffee ground material  New, significant abdominal pain  New, significant chest pain or pain under the shoulder blades  Painful or persistently difficult swallowing  New shortness of breath  Black, tarry-looking or red, bloody stools  FOLLOW UP:  Please also call with any specific questions about appointments or follow up tests.  

## 2024-02-15 DIAGNOSIS — J3089 Other allergic rhinitis: Secondary | ICD-10-CM | POA: Diagnosis not present

## 2024-02-15 DIAGNOSIS — J301 Allergic rhinitis due to pollen: Secondary | ICD-10-CM | POA: Diagnosis not present

## 2024-02-15 DIAGNOSIS — J3081 Allergic rhinitis due to animal (cat) (dog) hair and dander: Secondary | ICD-10-CM | POA: Diagnosis not present

## 2024-02-19 DIAGNOSIS — J189 Pneumonia, unspecified organism: Secondary | ICD-10-CM | POA: Diagnosis not present

## 2024-02-19 DIAGNOSIS — R051 Acute cough: Secondary | ICD-10-CM | POA: Diagnosis not present

## 2024-02-21 NOTE — Progress Notes (Unsigned)
 Cardiology Office Note:  .   Date:  02/21/2024  ID:  Cory Mathis, DOB 12-Mar-1951, MRN [id number] PCP: Dayna Motto, DO  [facility] Providers Cardiologist:  Vina Gull, MD Electrophysiologist:  OLE ONEIDA HOLTS, MD {  History of Present Illness: .   Cory Mathis is a 73 y.o. male w/PMHx of  HTN, OSA (w/CPAP), seizure d/o, hx of L frontal brain mass, neurovasculitis, multinodular goiter (s/p L thyroidectomy)  Coronary Ca++ AFib  S/p watchman 01/10/24 Post Implant Anticoagulation Strategy: Continue Xarelto  20mg  by mouth once daily for 45 days after implant. After 45 days, stop Xarelto  and start Plavix 75mg  by mouth once daily to complete 6 months of post implant medical therapy. Plan for CT scan 60 days after implant to assess appendage patency and Watchman position.  He saw the AFib clinic 01/21/24, pt reported AFib POD #1 post watchman, fatigued, anxious with it AFib rate controlled planned for TEE/DCCV at least 4 weeks post implant and OAC for 4 weeks after that  ER visit 01/23/24, after traumatic head injury, no neuro symptoms Had laceration repaired CT head no acute findings  02/07/24 TEE/DCCV LVEF 55-60% No LA/LAA thrombus Well-seated Watchman device with no evidence of leak. Trivial MR, TR, AR. DCCV > SR  Today's visit is scheduled as his post LAAO visit ROS:   No symptoms of AFib post DCCV For a few weeks really struggling with sinues and post nasal gtt >> eventually settled in his chest > with cough/some wheezing, UCC yesterday and started on Abx  No CP, no SOB outside of his URI above No near syncope or syncope No bleeding or signs of bleeding  Arrhythmia/AAD hx AFib Watchman implant 01/10/24  Studies Reviewed: SABRA    EKG done today and reviewed by myself:  SR 60bpm, RBBB  02/07/24: TEE 1. Left ventricular ejection fraction, by estimation, is 55 to 60%. The  left ventricle has normal function. The left ventricle has no regional  wall motion  abnormalities.   2. Right ventricular systolic function is normal. The right ventricular  size is normal.   3. Watchman device well-seated. There is no flow into the left atrial  appendage. No left atrial/left atrial appendage thrombus was detected.   4. The mitral valve is normal in structure. Trivial mitral valve  regurgitation. No evidence of mitral stenosis.   5. The aortic valve is tricuspid. Aortic valve regurgitation is trivial.  No aortic stenosis is present.   6. The inferior vena cava is normal in size with greater than 50%  respiratory variability, suggesting right atrial pressure of 3 mmHg.   01/10/24: Watchman CONCLUSIONS:  1.Successful implantation of a WATCHMAN left atrial appendage occlusive device    2. TEE demonstrating no LAA thrombus 3. No early apparent complications.  Risk Assessment/Calculations:    Physical Exam:   VS:  There were no vitals taken for this visit.   Wt Readings from Last 3 Encounters:  01/21/24 237 lb 6.4 oz (107.7 kg)  01/10/24 230 lb (104.3 kg)  12/20/23 226 lb (102.5 kg)    GEN: Well nourished, well developed in no acute distress NECK: No JVD; No carotid bruits CARDIAC: RRR, no murmurs, rubs, gallops RESPIRATORY:  CTA b/l without rales, wheezing or rhonchi  ABDOMEN: Soft, non-tender, non-distended EXTREMITIES: No edema; No deformity   ASSESSMENT AND PLAN: .    persistent AFib CHA2DS2Vasc is 3, on Xarelto  S/p Watchman Implant 7/31 DCCV 02/07/24 He will need 4 weeks of a/c post DCCV > plan to see  him then for transition to clopidogrel if no recurrent AFib/need for rhythm control again dental prophylaxis discussed/written CT is scheduled 03/12/24 (TEE looked OK)   Secondary hypercoagulable state 2/2 AFib   Dispo: back week of 9/29 to revisit meds > transition off xarelto  to plavix  Signed, Charlies Macario Arthur, PA-C

## 2024-02-22 ENCOUNTER — Encounter: Payer: Self-pay | Admitting: Physician Assistant

## 2024-02-22 ENCOUNTER — Ambulatory Visit: Admitting: Physician Assistant

## 2024-02-22 VITALS — BP 110/68 | Ht 71.0 in | Wt 233.0 lb

## 2024-02-22 DIAGNOSIS — Z95818 Presence of other cardiac implants and grafts: Secondary | ICD-10-CM | POA: Diagnosis not present

## 2024-02-22 DIAGNOSIS — I4819 Other persistent atrial fibrillation: Secondary | ICD-10-CM | POA: Insufficient documentation

## 2024-02-22 DIAGNOSIS — D6869 Other thrombophilia: Secondary | ICD-10-CM | POA: Insufficient documentation

## 2024-02-22 DIAGNOSIS — I4891 Unspecified atrial fibrillation: Secondary | ICD-10-CM | POA: Diagnosis not present

## 2024-02-22 DIAGNOSIS — Z79899 Other long term (current) drug therapy: Secondary | ICD-10-CM | POA: Diagnosis not present

## 2024-02-22 DIAGNOSIS — Z7901 Long term (current) use of anticoagulants: Secondary | ICD-10-CM | POA: Diagnosis not present

## 2024-02-22 MED ORDER — AMOXICILLIN 500 MG PO TABS: 2000.0000 mg | ORAL_TABLET | Freq: Once | ORAL | 1 refills | Status: AC

## 2024-02-22 NOTE — Patient Instructions (Signed)
 Medication Instructions:   STAT TAKING :  AMOXICILLIN  500 MG: TAKE 4 TABLETS  ( 1 HOUR PRIOR TO  DENTAL WORK )   *If you need a refill on your cardiac medications before your next appointment, please call your pharmacy*   Lab Work: NONE ORDERED  TODAY    If you have labs (blood work) drawn today and your tests are completely normal, you will receive your results only by: MyChart Message (if you have MyChart) OR A paper copy in the mail If you have any lab test that is abnormal or we need to change your treatment, we will call you to review the results.    Testing/Procedures: NONE ORDERED  TODAY     Follow-Up: At [facility], you and your health needs are our priority.  As part of our continuing mission to provide you with exceptional heart care, our providers are all part of one team.  This team includes your primary Cardiologist (physician) and Advanced Practice Providers or APPs (Physician Assistants and Nurse Practitioners) who all work together to provide you with the care you need, when you need it.  Your next appointment:   WEEK OF 9-29  Provider:  Daphne Barrack, NP or Ozell Jodie Passey, PA-C  ( CONTACT  CASSIE HALL/ ANGELINE HAMMER FOR EP SCHEDULING ISSUES )    We recommend signing up for the patient portal called MyChart.  Sign up information is provided on this After Visit Summary.  MyChart is used to connect with patients for Virtual Visits (Telemedicine).  Patients are able to view lab/test results, encounter notes, upcoming appointments, etc.  Non-urgent messages can be sent to your provider as well.   To learn more about what you can do with MyChart, go to [email or web address].   Other Instructions

## 2024-02-23 LAB — T3, FREE: T3, Free: 3.3 pg/mL (ref 2.0–4.4)

## 2024-02-23 LAB — T4, FREE: Free T4: 1.23 ng/dL (ref 0.82–1.77)

## 2024-02-23 LAB — TSH: TSH: 0.363 u[IU]/mL — ABNORMAL LOW (ref 0.450–4.500)

## 2024-02-25 ENCOUNTER — Ambulatory Visit: Payer: Self-pay | Admitting: Internal Medicine

## 2024-03-03 DIAGNOSIS — G0481 Other encephalitis and encephalomyelitis: Secondary | ICD-10-CM | POA: Diagnosis not present

## 2024-03-03 DIAGNOSIS — I776 Arteritis, unspecified: Secondary | ICD-10-CM | POA: Diagnosis not present

## 2024-03-03 DIAGNOSIS — D8989 Other specified disorders involving the immune mechanism, not elsewhere classified: Secondary | ICD-10-CM | POA: Diagnosis not present

## 2024-03-03 DIAGNOSIS — Z79899 Other long term (current) drug therapy: Secondary | ICD-10-CM | POA: Diagnosis not present

## 2024-03-06 DIAGNOSIS — J3081 Allergic rhinitis due to animal (cat) (dog) hair and dander: Secondary | ICD-10-CM | POA: Diagnosis not present

## 2024-03-06 DIAGNOSIS — J3089 Other allergic rhinitis: Secondary | ICD-10-CM | POA: Diagnosis not present

## 2024-03-06 DIAGNOSIS — J301 Allergic rhinitis due to pollen: Secondary | ICD-10-CM | POA: Diagnosis not present

## 2024-03-08 ENCOUNTER — Encounter: Payer: Self-pay | Admitting: Family Medicine

## 2024-03-10 ENCOUNTER — Ambulatory Visit: Attending: Student | Admitting: Student

## 2024-03-10 ENCOUNTER — Encounter: Payer: Self-pay | Admitting: Student

## 2024-03-10 ENCOUNTER — Other Ambulatory Visit (HOSPITAL_COMMUNITY): Payer: Self-pay | Admitting: Pharmacy Technician

## 2024-03-10 VITALS — BP 138/50 | HR 62 | Ht 70.5 in | Wt 238.0 lb

## 2024-03-10 DIAGNOSIS — Z23 Encounter for immunization: Secondary | ICD-10-CM | POA: Diagnosis not present

## 2024-03-10 DIAGNOSIS — I4819 Other persistent atrial fibrillation: Secondary | ICD-10-CM | POA: Insufficient documentation

## 2024-03-10 DIAGNOSIS — D6869 Other thrombophilia: Secondary | ICD-10-CM | POA: Diagnosis not present

## 2024-03-10 MED ORDER — CLOPIDOGREL BISULFATE 75 MG PO TABS: 75.0000 mg | ORAL_TABLET | Freq: Every day | ORAL | 3 refills | Status: DC

## 2024-03-10 NOTE — Patient Instructions (Signed)
 Medication Instructions:  1.Stop xarelto  2.Start plavix 75 mg daily-starting tomorrow 03/11/24 *If you need a refill on your cardiac medications before your next appointment, please call your pharmacy*  Lab Work: None ordered If you have labs (blood work) drawn today and your tests are completely normal, you will receive your results only by: MyChart Message (if you have MyChart) OR A paper copy in the mail If you have any lab test that is abnormal or we need to change your treatment, we will call you to review the results.  Testing/Procedures: See CT letter  Follow-Up: At [facility], you and your health needs are our priority.  As part of our continuing mission to provide you with exceptional heart care, our providers are all part of one team.  This team includes your primary Cardiologist (physician) and Advanced Practice Providers or APPs (Physician Assistants and Nurse Practitioners) who all work together to provide you with the care you need, when you need it.  Your next appointment:   End of January 2026  Provider:   Ozell Jodie Passey, PA-C

## 2024-03-10 NOTE — Progress Notes (Signed)
  Electrophysiology Office Note:   Date:  03/10/2024  ID:  Lorrene Gardner, DOB 09/09/1950, MRN [id number]  Primary Cardiologist: Vina Gull, MD Electrophysiologist: OLE ONEIDA HOLTS, MD      History of Present Illness:   Cory Mathis is a 73 y.o. male with h/o HTN, OSA w CPAP, h/o seizure, h/o goiter s/p L thyroidectomy, Persistent AF and coronary calcifications seen today for routine electrophysiology follow-up s/p Watchman implantation.  Was back in AF s/p Watchman and went for cardioversion, delaying his OAC strategy.  Since last being seen in our clinic the patient reports doing well. No further AF of which he is aware. Overall, he denies chest pain, palpitations, dyspnea, PND, orthopnea, nausea, vomiting, dizziness, syncope, edema, weight gain, or early satiety.   Still with easy bruising on Xarelto .  Review of systems complete and found to be negative unless listed in HPI.   EP Information / Studies Reviewed:    EKG is ordered today. Personal review as below.  EKG Interpretation Date/Time:  Monday March 10 2024 09:14:47 EDT Ventricular Rate:  62 PR Interval:  162 QRS Duration:  132 QT Interval:  406 QTC Calculation: 412 R Axis:   -19  Text Interpretation: Normal sinus rhythm Right bundle branch block When compared with ECG of 22-Feb-2024 10:09, No significant change was found Confirmed by Lesia Sharper [phone]) on 03/10/2024 9:23:19 AM    Arrhythmia/Device History No specialty comments available.   Physical Exam:   VS:  BP (!) 138/50   Pulse 62   Ht 5' 10.5 (1.791 m)   Wt 238 lb (108 kg)   SpO2 96%   BMI 33.67 kg/m    Wt Readings from Last 3 Encounters:  03/10/24 238 lb (108 kg)  02/22/24 233 lb (105.7 kg)  01/21/24 237 lb 6.4 oz (107.7 kg)     GEN: No acute distress NECK: No JVD; No carotid bruits CARDIAC: Regular rate and rhythm, no murmurs, rubs, gallops RESPIRATORY:  Clear to auscultation without rales, wheezing or rhonchi  ABDOMEN: Soft, non-tender,  non-distended EXTREMITIES:  No edema; No deformity   ASSESSMENT AND PLAN:    Atrial fibrillation S/p Watchman 01/10/2024 CT planned for 03/12/2024 Had AF recurrence after watchman, now s/p [facility] 02/07/2024 Tomorrow, transition from Xarelto  to Plavix 75 mg daily.  Has Prophylactic antibiotics for dental procedures prescribed  Secondary hypercoagulable state Pt to transition to plavix as above s/p Watchman Will be able to come off plavix after January visit.    Follow up with EP Team in January for 6 month post op visit.   Signed, Sharper Prentice Lesia, PA-C

## 2024-03-11 DIAGNOSIS — J3089 Other allergic rhinitis: Secondary | ICD-10-CM | POA: Diagnosis not present

## 2024-03-11 DIAGNOSIS — J301 Allergic rhinitis due to pollen: Secondary | ICD-10-CM | POA: Diagnosis not present

## 2024-03-12 ENCOUNTER — Ambulatory Visit (HOSPITAL_COMMUNITY)
Admission: RE | Admit: 2024-03-12 | Discharge: 2024-03-12 | Disposition: A | Discharge status: Home / Self Care | Source: Ambulatory Visit | Attending: Cardiovascular Disease | Admitting: Cardiovascular Disease

## 2024-03-12 DIAGNOSIS — I4819 Other persistent atrial fibrillation: Secondary | ICD-10-CM | POA: Insufficient documentation

## 2024-03-12 DIAGNOSIS — Z95818 Presence of other cardiac implants and grafts: Secondary | ICD-10-CM | POA: Diagnosis not present

## 2024-03-12 DIAGNOSIS — I517 Cardiomegaly: Secondary | ICD-10-CM | POA: Diagnosis not present

## 2024-03-12 MED ORDER — IOHEXOL 350 MG/ML SOLN
80.0000 mL | Freq: Once | INTRAVENOUS | Status: AC | PRN
  Administered 2024-03-12: 80 mL via INTRAVENOUS

## 2024-03-13 ENCOUNTER — Ambulatory Visit: Payer: Self-pay | Admitting: Cardiology

## 2024-03-17 DIAGNOSIS — G0481 Other encephalitis and encephalomyelitis: Secondary | ICD-10-CM | POA: Diagnosis not present

## 2024-03-17 DIAGNOSIS — D8989 Other specified disorders involving the immune mechanism, not elsewhere classified: Secondary | ICD-10-CM | POA: Diagnosis not present

## 2024-03-17 DIAGNOSIS — I776 Arteritis, unspecified: Secondary | ICD-10-CM | POA: Diagnosis not present

## 2024-03-20 DIAGNOSIS — J3089 Other allergic rhinitis: Secondary | ICD-10-CM | POA: Diagnosis not present

## 2024-03-20 DIAGNOSIS — J301 Allergic rhinitis due to pollen: Secondary | ICD-10-CM | POA: Diagnosis not present

## 2024-03-24 DIAGNOSIS — N3281 Overactive bladder: Secondary | ICD-10-CM | POA: Diagnosis not present

## 2024-03-24 DIAGNOSIS — R3914 Feeling of incomplete bladder emptying: Secondary | ICD-10-CM | POA: Diagnosis not present

## 2024-03-24 DIAGNOSIS — N400 Enlarged prostate without lower urinary tract symptoms: Secondary | ICD-10-CM | POA: Diagnosis not present

## 2024-03-24 DIAGNOSIS — N401 Enlarged prostate with lower urinary tract symptoms: Secondary | ICD-10-CM | POA: Diagnosis not present

## 2024-03-24 DIAGNOSIS — R351 Nocturia: Secondary | ICD-10-CM | POA: Diagnosis not present

## 2024-03-24 DIAGNOSIS — R972 Elevated prostate specific antigen [PSA]: Secondary | ICD-10-CM | POA: Diagnosis not present

## 2024-03-24 DIAGNOSIS — R3912 Poor urinary stream: Secondary | ICD-10-CM | POA: Diagnosis not present

## 2024-03-24 DIAGNOSIS — R350 Frequency of micturition: Secondary | ICD-10-CM | POA: Diagnosis not present

## 2024-03-24 DIAGNOSIS — R3915 Urgency of urination: Secondary | ICD-10-CM | POA: Diagnosis not present

## 2024-03-31 DIAGNOSIS — J3089 Other allergic rhinitis: Secondary | ICD-10-CM | POA: Diagnosis not present

## 2024-03-31 DIAGNOSIS — J301 Allergic rhinitis due to pollen: Secondary | ICD-10-CM | POA: Diagnosis not present

## 2024-03-31 DIAGNOSIS — J3081 Allergic rhinitis due to animal (cat) (dog) hair and dander: Secondary | ICD-10-CM | POA: Diagnosis not present

## 2024-04-02 DIAGNOSIS — R3121 Asymptomatic microscopic hematuria: Secondary | ICD-10-CM | POA: Diagnosis not present

## 2024-04-07 DIAGNOSIS — J3089 Other allergic rhinitis: Secondary | ICD-10-CM | POA: Diagnosis not present

## 2024-04-07 DIAGNOSIS — J3081 Allergic rhinitis due to animal (cat) (dog) hair and dander: Secondary | ICD-10-CM | POA: Diagnosis not present

## 2024-04-07 DIAGNOSIS — J301 Allergic rhinitis due to pollen: Secondary | ICD-10-CM | POA: Diagnosis not present

## 2024-04-11 DIAGNOSIS — K4090 Unilateral inguinal hernia, without obstruction or gangrene, not specified as recurrent: Secondary | ICD-10-CM | POA: Diagnosis not present

## 2024-04-11 DIAGNOSIS — N281 Cyst of kidney, acquired: Secondary | ICD-10-CM | POA: Diagnosis not present

## 2024-04-11 DIAGNOSIS — K5730 Diverticulosis of large intestine without perforation or abscess without bleeding: Secondary | ICD-10-CM | POA: Diagnosis not present

## 2024-04-11 DIAGNOSIS — R3129 Other microscopic hematuria: Secondary | ICD-10-CM | POA: Diagnosis not present

## 2024-04-14 ENCOUNTER — Other Ambulatory Visit: Payer: Self-pay | Admitting: Internal Medicine

## 2024-04-14 DIAGNOSIS — J3089 Other allergic rhinitis: Secondary | ICD-10-CM | POA: Diagnosis not present

## 2024-04-14 DIAGNOSIS — J301 Allergic rhinitis due to pollen: Secondary | ICD-10-CM | POA: Diagnosis not present

## 2024-04-14 DIAGNOSIS — J3081 Allergic rhinitis due to animal (cat) (dog) hair and dander: Secondary | ICD-10-CM | POA: Diagnosis not present

## 2024-04-16 MED ORDER — ROSUVASTATIN CALCIUM 5 MG PO TABS: ORAL_TABLET | ORAL | 2 refills | Status: AC

## 2024-04-21 DIAGNOSIS — J301 Allergic rhinitis due to pollen: Secondary | ICD-10-CM | POA: Diagnosis not present

## 2024-04-21 DIAGNOSIS — J3081 Allergic rhinitis due to animal (cat) (dog) hair and dander: Secondary | ICD-10-CM | POA: Diagnosis not present

## 2024-04-21 DIAGNOSIS — J3089 Other allergic rhinitis: Secondary | ICD-10-CM | POA: Diagnosis not present

## 2024-04-22 DIAGNOSIS — Z95818 Presence of other cardiac implants and grafts: Secondary | ICD-10-CM | POA: Diagnosis not present

## 2024-04-22 DIAGNOSIS — Z79620 Long term (current) use of immunosuppressive biologic: Secondary | ICD-10-CM | POA: Diagnosis not present

## 2024-04-22 DIAGNOSIS — I776 Arteritis, unspecified: Secondary | ICD-10-CM | POA: Diagnosis not present

## 2024-04-22 DIAGNOSIS — Z7952 Long term (current) use of systemic steroids: Secondary | ICD-10-CM | POA: Diagnosis not present

## 2024-04-22 DIAGNOSIS — Z23 Encounter for immunization: Secondary | ICD-10-CM | POA: Diagnosis not present

## 2024-04-22 DIAGNOSIS — Z87891 Personal history of nicotine dependence: Secondary | ICD-10-CM | POA: Diagnosis not present

## 2024-04-22 DIAGNOSIS — I6602 Occlusion and stenosis of left middle cerebral artery: Secondary | ICD-10-CM | POA: Diagnosis not present

## 2024-04-22 DIAGNOSIS — M810 Age-related osteoporosis without current pathological fracture: Secondary | ICD-10-CM | POA: Diagnosis not present

## 2024-04-28 DIAGNOSIS — J301 Allergic rhinitis due to pollen: Secondary | ICD-10-CM | POA: Diagnosis not present

## 2024-04-28 DIAGNOSIS — J3081 Allergic rhinitis due to animal (cat) (dog) hair and dander: Secondary | ICD-10-CM | POA: Diagnosis not present

## 2024-04-28 DIAGNOSIS — R3121 Asymptomatic microscopic hematuria: Secondary | ICD-10-CM | POA: Diagnosis not present

## 2024-04-28 DIAGNOSIS — D49511 Neoplasm of unspecified behavior of right kidney: Secondary | ICD-10-CM | POA: Diagnosis not present

## 2024-04-28 DIAGNOSIS — J3089 Other allergic rhinitis: Secondary | ICD-10-CM | POA: Diagnosis not present

## 2024-04-29 DIAGNOSIS — I6612 Occlusion and stenosis of left anterior cerebral artery: Secondary | ICD-10-CM | POA: Diagnosis not present

## 2024-04-29 DIAGNOSIS — I6602 Occlusion and stenosis of left middle cerebral artery: Secondary | ICD-10-CM | POA: Diagnosis not present

## 2024-04-29 DIAGNOSIS — Z7952 Long term (current) use of systemic steroids: Secondary | ICD-10-CM | POA: Diagnosis not present

## 2024-04-29 DIAGNOSIS — I677 Cerebral arteritis, not elsewhere classified: Secondary | ICD-10-CM | POA: Diagnosis not present

## 2024-04-29 DIAGNOSIS — I776 Arteritis, unspecified: Secondary | ICD-10-CM | POA: Diagnosis not present

## 2024-04-29 DIAGNOSIS — Z87891 Personal history of nicotine dependence: Secondary | ICD-10-CM | POA: Diagnosis not present

## 2024-04-29 DIAGNOSIS — I6613 Occlusion and stenosis of bilateral anterior cerebral arteries: Secondary | ICD-10-CM | POA: Diagnosis not present

## 2024-05-06 DIAGNOSIS — J3081 Allergic rhinitis due to animal (cat) (dog) hair and dander: Secondary | ICD-10-CM | POA: Diagnosis not present

## 2024-05-06 DIAGNOSIS — J3089 Other allergic rhinitis: Secondary | ICD-10-CM | POA: Diagnosis not present

## 2024-05-06 DIAGNOSIS — J301 Allergic rhinitis due to pollen: Secondary | ICD-10-CM | POA: Diagnosis not present

## 2024-05-13 DIAGNOSIS — L821 Other seborrheic keratosis: Secondary | ICD-10-CM | POA: Diagnosis not present

## 2024-05-13 DIAGNOSIS — D692 Other nonthrombocytopenic purpura: Secondary | ICD-10-CM | POA: Diagnosis not present

## 2024-05-13 DIAGNOSIS — L820 Inflamed seborrheic keratosis: Secondary | ICD-10-CM | POA: Diagnosis not present

## 2024-05-13 DIAGNOSIS — Z85828 Personal history of other malignant neoplasm of skin: Secondary | ICD-10-CM | POA: Diagnosis not present

## 2024-05-13 DIAGNOSIS — D1801 Hemangioma of skin and subcutaneous tissue: Secondary | ICD-10-CM | POA: Diagnosis not present

## 2024-05-14 ENCOUNTER — Other Ambulatory Visit: Payer: Self-pay

## 2024-05-15 ENCOUNTER — Telehealth: Payer: Self-pay | Admitting: Internal Medicine

## 2024-05-15 MED ORDER — LOSARTAN POTASSIUM 50 MG PO TABS: 50.0000 mg | ORAL_TABLET | Freq: Every day | ORAL | 2 refills | Status: AC

## 2024-05-15 NOTE — Telephone Encounter (Signed)
 Refill sent

## 2024-05-15 NOTE — Telephone Encounter (Signed)
*  STAT* If patient is at the pharmacy, call can be transferred to refill team.   1. Which medications need to be refilled? (please list name of each medication and dose if known)   losartan  (COZAAR ) 50 MG tablet    2. Which pharmacy/location (including street and city if local pharmacy) is medication to be sent to?  [id number] - [place], [place] - [address] AT [facility] OF [address]      3. Do they need a 30 day or 90 day supply? 90 day

## 2024-05-16 ENCOUNTER — Telehealth: Payer: Self-pay | Admitting: Cardiology

## 2024-05-16 NOTE — Telephone Encounter (Signed)
 Pt c/o medication issue:  1. Name of Medication: clopidogrel  (PLAVIX ) 75 MG tablet   2. How are you currently taking this medication (dosage and times per day)? As written  3. Are you having a reaction (difficulty breathing--STAT)? no  4. What is your medication issue? Pt wants to know when he can stop taking this medication.Please advise

## 2024-05-19 NOTE — Telephone Encounter (Signed)
 Spoke with patient and shared response from Jodie Passey, PA-C: The plan will be to stop Plavix  after his January visit at which point he will switch to baby aspirin daily only.    Patient verbalized understanding and expressed appreciation for follow-up.

## 2024-05-20 DIAGNOSIS — J3089 Other allergic rhinitis: Secondary | ICD-10-CM | POA: Diagnosis not present

## 2024-05-20 DIAGNOSIS — J301 Allergic rhinitis due to pollen: Secondary | ICD-10-CM | POA: Diagnosis not present

## 2024-05-22 ENCOUNTER — Telehealth (HOSPITAL_COMMUNITY): Payer: Self-pay | Admitting: *Deleted

## 2024-05-22 MED ORDER — RIVAROXABAN 20 MG PO TABS: 20.0000 mg | ORAL_TABLET | Freq: Every day | ORAL | 3 refills | Status: AC

## 2024-05-22 NOTE — Telephone Encounter (Signed)
 Pt called and reported being in Afib x 3 days with HR 70-90 bp 128/63. Pt requesting DCCV. Spoke with R.Fenton P.A. he suggest pt come in tomorrow for appt to assess and to start Xarelto  20 mg daily. Pt agrees with plan.

## 2024-05-23 ENCOUNTER — Ambulatory Visit (HOSPITAL_COMMUNITY)
Admission: RE | Admit: 2024-05-23 | Discharge: 2024-05-23 | Disposition: A | Source: Ambulatory Visit | Attending: Physician Assistant | Admitting: Physician Assistant

## 2024-05-23 VITALS — BP 132/72 | HR 110 | Ht 70.5 in | Wt 239.6 lb

## 2024-05-23 DIAGNOSIS — D6869 Other thrombophilia: Secondary | ICD-10-CM

## 2024-05-23 DIAGNOSIS — I4819 Other persistent atrial fibrillation: Secondary | ICD-10-CM | POA: Diagnosis not present

## 2024-05-23 DIAGNOSIS — I4891 Unspecified atrial fibrillation: Secondary | ICD-10-CM

## 2024-05-23 NOTE — H&P (View-Only) (Signed)
 Primary Care Physician: Dayna Motto, DO Referring Physician: Jodie Passey, PA Cardiologist: Dr. Okey  Primary EP: Dr Cindie Lorrene Cory Mathis is a 73 y.o. male with a h/o persistent afib, bradycardia, HTN, OSA, treated with cpap, that is in the afib clinic s/p successful  cardioversion 02/08/21, scheduled by Jodie Passey.   EKG shows sinus brady at 54 bpm, not symptomatic with this.  Per [name]'s note, he preferred not to be on AAD's and would prefer ablation in the future if needed. He was not aware of the afib, just noted higher HR's on his apple watch and requested appointment with  Dr. Okey. He is already scheduled with Dr. Cindie 10/27. He had a basically normal echo in 2020 and is scheduled for a updated echo. He denies alcohol use, no tobacco, some caffeine, not excessive.    Follow up in the AF clinic 10/24/21. Patient reports that about 5 days ago he noted elevated heart rates on his Apple Watch and symptoms of sluggishness. He is in afib today with elevated heart rates. He has recently been treated for a respiratory infection with abx and increased prednisone .   F/u in afib clinic, 02/01/22,  as pt has developed persistent afib for the last 2 days associated with fatigue and lightheadedness. 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 He is now down 7.5 mg daily. No known triggers for afib. He saw Dr. Cindie in March and due to low afib burden, it was a wait and watch approach. He had afib in May, set up for cardioversion but spontaneously converted and now again in August. No missed xarelto .   Follow up in the AF clinic 05/25/22. Patient reports that one week ago he started having symptoms of fatigue and SOB. His smart watch has shown persistent afib since then. He did have an alcoholic drink just prior to the onset of his symptoms. No bleeding issues on anticoagulation.   Follow up in the AF clinic  06/15/22. Patient is s/p DCCV 06/01/22. He is back in SR with resolution of his symptoms.   Follow up in the AF clinic 06/20/23. Patient reports that he has done well since his last visit. He denies any interim symptoms of afib. He is concerned about his bleeding risk on anticoagulation.   Follow up 01/21/24. Patient returns for follow up for atrial fibrillation. He is s/p Watchman implant 01/10/24. He went out of rhythm the day after his procedure. He feels fatigued and anxious when in afib. No bleeding issues currently. His groin sites have healed from the procedure.   Follow up 05/23/24. Patient returns for follow up for atrial fibrillation. He reports that he went into afib on 05/20/24 with symptoms of palpitations and fatigue. There were no specific triggers that he could identify. He resumed Xarelto  today.   Today, he  denies symptoms of chest pain, shortness of breath, orthopnea, PND, lower extremity edema, dizziness, presyncope, syncope, snoring, daytime somnolence, bleeding, or neurologic sequela. The patient is tolerating medications without difficulties and is otherwise without complaint today.    Past Medical History:  Diagnosis Date   Allergic rhinitis    Allergy    Atrial fibrillation with rapid ventricular response (HCC)    Benign neoplasm of right choroid    BPH (benign prostatic hyperplasia)    Brain tumor (benign) (HCC)    Cataract    bilateral,removed   Chest pain  Chronic headaches    Constipation    Diverticulosis of colon    Dysplastic nevus    Dysrhythmia 2019   Afib   Fatigue    Gastroesophageal reflux disease without esophagitis    GERD (gastroesophageal reflux disease)    prn med. control   H/O thyroidectomy    Headache    History of adenomatous polyp of colon    03-29-2007  tubular adenoma/  08-07-2016  hyperplastic and tubular adenoma's   History of alcoholic gastritis    10-30-2008  gastritis, esophagitis, peptic duodenitis   History of colonic  diverticulitis    01-31-2016  resolved w/ medications   History of Helicobacter pylori infection    10-30-2008   History of thyroid  nodule    thyroid  multinodule goiter left side s/p  left thyroidectomy   Hypertension    Hypothyroidism    Insomnia    Internal hemorrhoids    Left leg weakness    Memory loss    Nocturia    OSA on CPAP    CPAP nightly   Paresthesia of both feet    Retinal pigmentation    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HCC)    Right thyroid  nodule    Sinus bradycardia    Sleep apnea    c pap   Urinary retention 12/13/2016    Current Outpatient Medications  Medication Sig Dispense Refill   Carboxymethylcellul-Glycerin (LUBRICATING EYE DROPS OP) Place 1 drop into both eyes daily as needed (dry eyes).     diltiazem  (CARDIZEM  CD) 120 MG 24 hr capsule TAKE 1 CAPSULE(120 MG) BY MOUTH TWICE DAILY 180 capsule 2   EPINEPHrine  0.3 mg/0.3 mL IJ SOAJ injection Inject 0.3 mg into the muscle as needed for anaphylaxis.     eszopiclone (LUNESTA) 2 MG TABS tablet Take 2 mg by mouth at bedtime. Take immediately before bedtime     famotidine-calcium  carbonate-magnesium hydroxide (PEPCID COMPLETE) 10-800-165 MG chewable tablet Chew 1 tablet by mouth at bedtime as needed (acid reflux).     losartan  (COZAAR ) 50 MG tablet Take 1 tablet (50 mg total) by mouth daily. 90 tablet 2   predniSONE  (STERAPRED UNI-PAK 21 TAB) 10 MG (21) TBPK tablet Take 10 mg by mouth daily.     rivaroxaban  (XARELTO ) 20 MG TABS tablet Take 1 tablet (20 mg total) by mouth daily with supper. 30 tablet 3   rosuvastatin  (CRESTOR ) 5 MG tablet TAKE 1/2 TABLET(2.5 MG) BY MOUTH DAILY 45 tablet 2   Vibegron (GEMTESA) 75 MG TABS Take 75 mg by mouth at bedtime.     clopidogrel  (PLAVIX ) 75 MG tablet Take 1 tablet (75 mg total) by mouth daily. (Patient not taking: Reported on 05/23/2024) 90 tablet 3   No current facility-administered medications for this encounter.    ROS- All systems are reviewed and negative except as per the HPI  above  Physical Exam: Vitals:   05/23/24 1116  BP: 132/72  Pulse: (!) 110  Weight: 108.7 kg  Height: 5' 10.5 (1.791 m)    Wt Readings from Last 3 Encounters:  05/23/24 108.7 kg  03/10/24 108 kg  02/22/24 105.7 kg    GEN: Well nourished, well developed in no acute distress CARDIAC: Irregularly irregular rate and rhythm, no murmurs, rubs, gallops RESPIRATORY:  Clear to auscultation without rales, wheezing or rhonchi  ABDOMEN: Soft, non-tender, non-distended EXTREMITIES:  No edema; No deformity    EKG Interpretation Date/Time:  Friday May 23 2024 11:25:44 EST Ventricular Rate:  110 PR Interval:    QRS Duration:  128 QT Interval:  354 QTC Calculation: 479 R Axis:   -30  Text Interpretation: Atrial fibrillation Left axis deviation Right bundle branch block Abnormal ECG When compared with ECG of 10-Mar-2024 09:14, Atrial fibrillation has replaced Sinus rhythm Confirmed by Edell Mesenbrink ([id number]) on 05/23/2024 12:08:36 PM    CHA2DS2-VASc Score = 3  The patient's score is based upon: CHF History: 0 HTN History: 1 Diabetes History: 0 Stroke History: 0 Vascular Disease History: 1 Age Score: 1 Gender Score: 0       ASSESSMENT AND PLAN: Persistent Atrial Fibrillation (ICD10:  I48.19) The patient's CHA2DS2-VASc score is 3, indicating a 3.2% annual risk of stroke.   Patient in persistent afib. We discussed rhythm control options. Short term, will plan for DCCV. Reviewed with Dr Cindie and he will NOT need TEE prior.  Continue diltiazem  120 mg BID Continue Xarelto  20 mg daily for 4 weeks post DCCV, then transition back to Plavix  75 mg daily. Long term, patient would be agreeable to consultation for ablation if he has quick return of afib.  Apple Watch for home monitoring.  Check bmet/cbc today.   Secondary Hypercoagulable State (ICD10:  D68.69) The patient is at significant risk for stroke/thromboembolism based upon his CHA2DS2-VASc Score of 3.  Continue Rivaroxaban   (Xarelto ) 20 mg daily for now. D/w implanting MD, will not need repeat imagining prior to DCCV. S/p Watchman implant 01/10/24.  HTN Stable on current regimen  CAD CAC 435 No anginal symptoms Followed by Dr Okey  OSA  Encouraged nightly CPAP   Follow up with Jodie Passey as scheduled.    Informed Consent   Shared Decision Making/Informed Consent The risks (stroke, cardiac arrhythmias rarely resulting in the need for a temporary or permanent pacemaker, skin irritation or burns and complications associated with conscious sedation including aspiration, arrhythmia, respiratory failure and death), benefits (restoration of normal sinus rhythm) and alternatives of a direct current cardioversion were explained in detail to Mr. Southard and he agrees to proceed.       Daril Kicks PA-C [facility] [address] [place], [place] [address] [phone]

## 2024-05-23 NOTE — Progress Notes (Signed)
 Primary Care Physician: Dayna Motto, DO Referring Physician: Jodie Passey, PA Cardiologist: Dr. Okey  Primary EP: Dr Cindie Lorrene Gardner is a 73 y.o. male with a h/o persistent afib, bradycardia, HTN, OSA, treated with cpap, that is in the afib clinic s/p successful  cardioversion 02/08/21, scheduled by Jodie Passey.   EKG shows sinus brady at 54 bpm, not symptomatic with this.  Per [name]'s note, he preferred not to be on AAD's and would prefer ablation in the future if needed. He was not aware of the afib, just noted higher HR's on his apple watch and requested appointment with  Dr. Okey. He is already scheduled with Dr. Cindie 10/27. He had a basically normal echo in 2020 and is scheduled for a updated echo. He denies alcohol use, no tobacco, some caffeine, not excessive.    Follow up in the AF clinic 10/24/21. Patient reports that about 5 days ago he noted elevated heart rates on his Apple Watch and symptoms of sluggishness. He is in afib today with elevated heart rates. He has recently been treated for a respiratory infection with abx and increased prednisone .   F/u in afib clinic, 02/01/22,  as pt has developed persistent afib for the last 2 days associated with fatigue and lightheadedness. 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 He is now down 7.5 mg daily. No known triggers for afib. He saw Dr. Cindie in March and due to low afib burden, it was a wait and watch approach. He had afib in May, set up for cardioversion but spontaneously converted and now again in August. No missed xarelto .   Follow up in the AF clinic 05/25/22. Patient reports that one week ago he started having symptoms of fatigue and SOB. His smart watch has shown persistent afib since then. He did have an alcoholic drink just prior to the onset of his symptoms. No bleeding issues on anticoagulation.   Follow up in the AF clinic  06/15/22. Patient is s/p DCCV 06/01/22. He is back in SR with resolution of his symptoms.   Follow up in the AF clinic 06/20/23. Patient reports that he has done well since his last visit. He denies any interim symptoms of afib. He is concerned about his bleeding risk on anticoagulation.   Follow up 01/21/24. Patient returns for follow up for atrial fibrillation. He is s/p Watchman implant 01/10/24. He went out of rhythm the day after his procedure. He feels fatigued and anxious when in afib. No bleeding issues currently. His groin sites have healed from the procedure.   Follow up 05/23/24. Patient returns for follow up for atrial fibrillation. He reports that he went into afib on 05/20/24 with symptoms of palpitations and fatigue. There were no specific triggers that he could identify. He resumed Xarelto  today.   Today, he  denies symptoms of chest pain, shortness of breath, orthopnea, PND, lower extremity edema, dizziness, presyncope, syncope, snoring, daytime somnolence, bleeding, or neurologic sequela. The patient is tolerating medications without difficulties and is otherwise without complaint today.    Past Medical History:  Diagnosis Date   Allergic rhinitis    Allergy    Atrial fibrillation with rapid ventricular response (HCC)    Benign neoplasm of right choroid    BPH (benign prostatic hyperplasia)    Brain tumor (benign) (HCC)    Cataract    bilateral,removed   Chest pain  Chronic headaches    Constipation    Diverticulosis of colon    Dysplastic nevus    Dysrhythmia 2019   Afib   Fatigue    Gastroesophageal reflux disease without esophagitis    GERD (gastroesophageal reflux disease)    prn med. control   H/O thyroidectomy    Headache    History of adenomatous polyp of colon    03-29-2007  tubular adenoma/  08-07-2016  hyperplastic and tubular adenoma's   History of alcoholic gastritis    10-30-2008  gastritis, esophagitis, peptic duodenitis   History of colonic  diverticulitis    01-31-2016  resolved w/ medications   History of Helicobacter pylori infection    10-30-2008   History of thyroid  nodule    thyroid  multinodule goiter left side s/p  left thyroidectomy   Hypertension    Hypothyroidism    Insomnia    Internal hemorrhoids    Left leg weakness    Memory loss    Nocturia    OSA on CPAP    CPAP nightly   Paresthesia of both feet    Retinal pigmentation    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HCC)    Right thyroid  nodule    Sinus bradycardia    Sleep apnea    c pap   Urinary retention 12/13/2016    Current Outpatient Medications  Medication Sig Dispense Refill   Carboxymethylcellul-Glycerin (LUBRICATING EYE DROPS OP) Place 1 drop into both eyes daily as needed (dry eyes).     diltiazem  (CARDIZEM  CD) 120 MG 24 hr capsule TAKE 1 CAPSULE(120 MG) BY MOUTH TWICE DAILY 180 capsule 2   EPINEPHrine  0.3 mg/0.3 mL IJ SOAJ injection Inject 0.3 mg into the muscle as needed for anaphylaxis.     eszopiclone (LUNESTA) 2 MG TABS tablet Take 2 mg by mouth at bedtime. Take immediately before bedtime     famotidine-calcium  carbonate-magnesium hydroxide (PEPCID COMPLETE) 10-800-165 MG chewable tablet Chew 1 tablet by mouth at bedtime as needed (acid reflux).     losartan  (COZAAR ) 50 MG tablet Take 1 tablet (50 mg total) by mouth daily. 90 tablet 2   predniSONE  (STERAPRED UNI-PAK 21 TAB) 10 MG (21) TBPK tablet Take 10 mg by mouth daily.     rivaroxaban  (XARELTO ) 20 MG TABS tablet Take 1 tablet (20 mg total) by mouth daily with supper. 30 tablet 3   rosuvastatin  (CRESTOR ) 5 MG tablet TAKE 1/2 TABLET(2.5 MG) BY MOUTH DAILY 45 tablet 2   Vibegron (GEMTESA) 75 MG TABS Take 75 mg by mouth at bedtime.     clopidogrel  (PLAVIX ) 75 MG tablet Take 1 tablet (75 mg total) by mouth daily. (Patient not taking: Reported on 05/23/2024) 90 tablet 3   No current facility-administered medications for this encounter.    ROS- All systems are reviewed and negative except as per the HPI  above  Physical Exam: Vitals:   05/23/24 1116  BP: 132/72  Pulse: (!) 110  Weight: 108.7 kg  Height: 5' 10.5 (1.791 m)    Wt Readings from Last 3 Encounters:  05/23/24 108.7 kg  03/10/24 108 kg  02/22/24 105.7 kg    GEN: Well nourished, well developed in no acute distress CARDIAC: Irregularly irregular rate and rhythm, no murmurs, rubs, gallops RESPIRATORY:  Clear to auscultation without rales, wheezing or rhonchi  ABDOMEN: Soft, non-tender, non-distended EXTREMITIES:  No edema; No deformity    EKG Interpretation Date/Time:  Friday May 23 2024 11:25:44 EST Ventricular Rate:  110 PR Interval:    QRS Duration:  128 QT Interval:  354 QTC Calculation: 479 R Axis:   -30  Text Interpretation: Atrial fibrillation Left axis deviation Right bundle branch block Abnormal ECG When compared with ECG of 10-Mar-2024 09:14, Atrial fibrillation has replaced Sinus rhythm Confirmed by Edell Mesenbrink ([id number]) on 05/23/2024 12:08:36 PM    CHA2DS2-VASc Score = 3  The patient's score is based upon: CHF History: 0 HTN History: 1 Diabetes History: 0 Stroke History: 0 Vascular Disease History: 1 Age Score: 1 Gender Score: 0       ASSESSMENT AND PLAN: Persistent Atrial Fibrillation (ICD10:  I48.19) The patient's CHA2DS2-VASc score is 3, indicating a 3.2% annual risk of stroke.   Patient in persistent afib. We discussed rhythm control options. Short term, will plan for DCCV. Reviewed with Dr Cindie and he will NOT need TEE prior.  Continue diltiazem  120 mg BID Continue Xarelto  20 mg daily for 4 weeks post DCCV, then transition back to Plavix  75 mg daily. Long term, patient would be agreeable to consultation for ablation if he has quick return of afib.  Apple Watch for home monitoring.  Check bmet/cbc today.   Secondary Hypercoagulable State (ICD10:  D68.69) The patient is at significant risk for stroke/thromboembolism based upon his CHA2DS2-VASc Score of 3.  Continue Rivaroxaban   (Xarelto ) 20 mg daily for now. D/w implanting MD, will not need repeat imagining prior to DCCV. S/p Watchman implant 01/10/24.  HTN Stable on current regimen  CAD CAC 435 No anginal symptoms Followed by Dr Okey  OSA  Encouraged nightly CPAP   Follow up with Jodie Passey as scheduled.    Informed Consent   Shared Decision Making/Informed Consent The risks (stroke, cardiac arrhythmias rarely resulting in the need for a temporary or permanent pacemaker, skin irritation or burns and complications associated with conscious sedation including aspiration, arrhythmia, respiratory failure and death), benefits (restoration of normal sinus rhythm) and alternatives of a direct current cardioversion were explained in detail to Mr. Southard and he agrees to proceed.       Daril Kicks PA-C [facility] [address] [place], [place] [address] [phone]

## 2024-05-23 NOTE — Patient Instructions (Addendum)
 Continue Xarelto  for 4 weeks after cardioversion then stop and then resume Plavix    Cardioversion scheduled for: 05/27/24 Tuesday 9:30 am    - Arrive at the [facility] ([address])  and check in with ADMITTING at 9:30 am    - Do not eat or drink anything after midnight the night prior to your procedure.   - Take all your morning medication (except diabetic medications) with a sip of water prior to arrival.  - Do NOT miss any doses of your blood thinner - if you should miss a dose or take a dose more than 4 hours late -- please notify our office immediately.  - You will not be able to drive home after your procedure. Please ensure you have a responsible adult to drive you home. You will need someone with you for 24 hours post procedure.     - Expect to be in the procedural area approximately 2 hours.   - If you feel as if you go back into normal rhythm prior to scheduled cardioversion, please notify our office immediately.   If your procedure is canceled in the cardioversion suite you will be charged a cancellation fee.

## 2024-05-24 LAB — BASIC METABOLIC PANEL WITH GFR
BUN/Creatinine Ratio: 14 (ref 10–24)
BUN: 13 mg/dL (ref 8–27)
CO2: 24 mmol/L (ref 20–29)
Calcium: 10.1 mg/dL (ref 8.6–10.2)
Chloride: 102 mmol/L (ref 96–106)
Creatinine, Ser: 0.96 mg/dL (ref 0.76–1.27)
Glucose: 96 mg/dL (ref 70–99)
Potassium: 5 mmol/L (ref 3.5–5.2)
Sodium: 140 mmol/L (ref 134–144)
eGFR: 83 mL/min/1.73 (ref >59)

## 2024-05-24 LAB — CBC
Hematocrit: 48 % (ref 37.5–51.0)
Hemoglobin: 16.4 g/dL (ref 13.0–17.7)
MCH: 33.1 pg — ABNORMAL HIGH (ref 26.6–33.0)
MCHC: 34.2 g/dL (ref 31.5–35.7)
MCV: 97 fL (ref 79–97)
Platelets: 373 x10E3/uL (ref 150–450)
RBC: 4.96 x10E6/uL (ref 4.14–5.80)
RDW: 12.5 % (ref 11.6–15.4)
WBC: 14.5 x10E3/uL — ABNORMAL HIGH (ref 3.4–10.8)

## 2024-05-26 ENCOUNTER — Ambulatory Visit (HOSPITAL_COMMUNITY): Payer: Self-pay | Admitting: Physician Assistant

## 2024-05-26 NOTE — Anesthesia Preprocedure Evaluation (Signed)
 Anesthesia Evaluation  Patient identified by MRN, date of birth, ID band Patient awake    Reviewed: Allergy & Precautions, NPO status , Patient's Chart, lab work & pertinent test results  History of Anesthesia Complications Negative for: history of anesthetic complications  Airway Mallampati: III  TM Distance: >3 FB Neck ROM: Full   Comment: Previous grade II view with MAC 4, 2-handed mask with OPA Dental  (+) Dental Advisory Given,    Pulmonary neg shortness of breath, sleep apnea and Continuous Positive Airway Pressure Ventilation , neg COPD, neg recent URI, former smoker   Pulmonary exam normal breath sounds clear to auscultation       Cardiovascular hypertension (losartan ), Pt. on medications (-) angina + CAD  (-) Past MI, (-) Cardiac Stents and (-) CABG + dysrhythmias (RBBB) Atrial Fibrillation  Rhythm:Irregular Rate:Normal  TTE 02/07/2024: IMPRESSIONS    1. Left ventricular ejection fraction, by estimation, is 55 to 60%. The  left ventricle has normal function. The left ventricle has no regional  wall motion abnormalities.   2. Right ventricular systolic function is normal. The right ventricular  size is normal.   3. Watchman device well-seated. There is no flow into the left atrial  appendage. No left atrial/left atrial appendage thrombus was detected.   4. The mitral valve is normal in structure. Trivial mitral valve  regurgitation. No evidence of mitral stenosis.   5. The aortic valve is tricuspid. Aortic valve regurgitation is trivial.  No aortic stenosis is present.   6. The inferior vena cava is normal in size with greater than 50%  respiratory variability, suggesting right atrial pressure of 3 mmHg.     Neuro/Psych  Headaches Benign brain tumor  Neuromuscular disease (neuropathy, right hemiparesis, left leg weakness, right-sided weakness)    GI/Hepatic Neg liver ROS,GERD  Medicated,,Diverticulosis     Endo/Other  neg diabetesHypothyroidism    Renal/GU negative Renal ROS     Musculoskeletal   Abdominal  (+) + obese  Peds  Hematology negative hematology ROS (+) Lab Results      Component                Value               Date                      WBC                      14.5 (H)            05/23/2024                HGB                      16.4                05/23/2024                HCT                      48.0                05/23/2024                MCV                      97  05/23/2024                PLT                      373                 05/23/2024              Anesthesia Other Findings Last Xarelto : this morning  Reproductive/Obstetrics                              Anesthesia Physical Anesthesia Plan  ASA: 3  Anesthesia Plan: General   Post-op Pain Management: Minimal or no pain anticipated   Induction: Intravenous  PONV Risk Score and Plan: 2 and Treatment may vary due to age or medical condition  Airway Management Planned: Natural Airway and Nasal Cannula  Additional Equipment:   Intra-op Plan:   Post-operative Plan:   Informed Consent: I have reviewed the patients History and Physical, chart, labs and discussed the procedure including the risks, benefits and alternatives for the proposed anesthesia with the patient or authorized representative who has indicated his/her understanding and acceptance.     Dental advisory given  Plan Discussed with: CRNA and Anesthesiologist  Anesthesia Plan Comments: (Risks of general anesthesia discussed including, but not limited to, sore throat, hoarse voice, chipped/damaged teeth, injury to vocal cords, nausea and vomiting, allergic reactions, lung infection, heart attack, stroke, and death. All questions answered. )         Anesthesia Quick Evaluation

## 2024-05-26 NOTE — Progress Notes (Signed)
 Pt called for pre procedure instructions. Arrival time 0900 NPO after midnight explained Instructed to take am meds with sip of water and confirmed blood thinner consistency. Instructed pt need for ride home tomorrow and have responsible adult with them for 24 hrs post procedure.

## 2024-05-27 ENCOUNTER — Ambulatory Visit (HOSPITAL_COMMUNITY)
Admission: RE | Admit: 2024-05-27 | Discharge: 2024-05-27 | Disposition: A | Discharge status: Home / Self Care | Attending: Cardiovascular Disease | Admitting: Cardiovascular Disease

## 2024-05-27 ENCOUNTER — Encounter (HOSPITAL_COMMUNITY): Admission: RE | Disposition: A | Payer: Self-pay | Attending: Cardiovascular Disease

## 2024-05-27 ENCOUNTER — Ambulatory Visit (HOSPITAL_COMMUNITY): Payer: Self-pay | Admitting: Anesthesiology

## 2024-05-27 ENCOUNTER — Encounter (HOSPITAL_COMMUNITY): Payer: Self-pay | Admitting: Cardiovascular Disease

## 2024-05-27 ENCOUNTER — Other Ambulatory Visit: Payer: Self-pay

## 2024-05-27 DIAGNOSIS — I2510 Atherosclerotic heart disease of native coronary artery without angina pectoris: Secondary | ICD-10-CM

## 2024-05-27 DIAGNOSIS — I4819 Other persistent atrial fibrillation: Secondary | ICD-10-CM | POA: Insufficient documentation

## 2024-05-27 DIAGNOSIS — Z95818 Presence of other cardiac implants and grafts: Secondary | ICD-10-CM | POA: Diagnosis not present

## 2024-05-27 DIAGNOSIS — I10 Essential (primary) hypertension: Secondary | ICD-10-CM | POA: Insufficient documentation

## 2024-05-27 DIAGNOSIS — G8191 Hemiplegia, unspecified affecting right dominant side: Secondary | ICD-10-CM | POA: Diagnosis not present

## 2024-05-27 DIAGNOSIS — Z7901 Long term (current) use of anticoagulants: Secondary | ICD-10-CM | POA: Diagnosis not present

## 2024-05-27 DIAGNOSIS — K219 Gastro-esophageal reflux disease without esophagitis: Secondary | ICD-10-CM | POA: Insufficient documentation

## 2024-05-27 DIAGNOSIS — Z87891 Personal history of nicotine dependence: Secondary | ICD-10-CM | POA: Diagnosis not present

## 2024-05-27 DIAGNOSIS — I4891 Unspecified atrial fibrillation: Secondary | ICD-10-CM

## 2024-05-27 DIAGNOSIS — D6869 Other thrombophilia: Secondary | ICD-10-CM | POA: Diagnosis not present

## 2024-05-27 DIAGNOSIS — Z79899 Other long term (current) drug therapy: Secondary | ICD-10-CM | POA: Diagnosis not present

## 2024-05-27 DIAGNOSIS — G4733 Obstructive sleep apnea (adult) (pediatric): Secondary | ICD-10-CM | POA: Diagnosis not present

## 2024-05-27 DIAGNOSIS — Z006 Encounter for examination for normal comparison and control in clinical research program: Secondary | ICD-10-CM

## 2024-05-27 HISTORY — PX: CARDIOVERSION: EP1203

## 2024-05-27 SURGERY — CARDIOVERSION (CATH LAB)
Anesthesia: General

## 2024-05-27 MED ORDER — LIDOCAINE 2% (20 MG/ML) 5 ML SYRINGE
INTRAMUSCULAR | Status: DC | PRN
  Administered 2024-05-27: 09:00:00 50 mg via INTRAVENOUS

## 2024-05-27 MED ORDER — SODIUM CHLORIDE 0.9 % IV SOLN: INTRAVENOUS | Status: DC

## 2024-05-27 MED ORDER — PROPOFOL 10 MG/ML IV BOLUS
INTRAVENOUS | Status: DC | PRN
  Administered 2024-05-27: 09:00:00 60 mg via INTRAVENOUS

## 2024-05-27 SURGICAL SUPPLY — 1 items: PAD DEFIB RADIO PHYSIO CONN (PAD) ×1 IMPLANT

## 2024-05-27 NOTE — Discharge Instructions (Signed)

## 2024-05-27 NOTE — Anesthesia Postprocedure Evaluation (Signed)
 Anesthesia Post Note  Patient: Cory Mathis  Procedure(s) Performed: CARDIOVERSION     Patient location during evaluation: PACU Anesthesia Type: General Level of consciousness: awake Pain management: pain level controlled Vital Signs Assessment: post-procedure vital signs reviewed and stable Respiratory status: spontaneous breathing, nonlabored ventilation and respiratory function stable Cardiovascular status: blood pressure returned to baseline and stable Postop Assessment: no apparent nausea or vomiting Anesthetic complications: no   No notable events documented.  Last Vitals:  Vitals:   05/27/24 0942 05/27/24 0952  BP:  122/73  Pulse:  66  Resp:  14  Temp:    SpO2: 95% 95%    Last Pain:  Vitals:   05/27/24 0952  TempSrc:   PainSc: 0-No pain                 Delon Aisha Arch

## 2024-05-27 NOTE — CV Procedure (Signed)
 DCC: On Xarelto  4 days but has Watchman device in place with post implant CT showing full occlusion of appendage and no leak  DCC x 1 200 J  Converted from afib to NSR rate 64 bpm  No immediate neurologic sequelae  Patient knows he needs to stay on xarelto  for 4 weeks post [facility] Then can go back to plavix  with Watchman device  [name] MD [facility]

## 2024-05-27 NOTE — Research (Signed)
 [facility] Cardioversion Informed Consent   Subject Name: Cory Mathis  Subject met inclusion and exclusion criteria.  The informed consent form, study requirements and expectations were reviewed with the subject and questions and concerns were addressed prior to the signing of the consent form.  The subject verbalized understanding of the trial requirements.  The subject agreed to participate in the [facility] Cardioversion trial and signed the informed consent at 0829 on 16/Dec/2025.  The informed consent was obtained prior to performance of any protocol-specific procedures for the subject.  A copy of the signed informed consent was given to the subject and a copy was placed in the subject's medical record.   [name]

## 2024-05-27 NOTE — Interval H&P Note (Signed)
 History and Physical Interval Note:  05/27/2024 8:24 AM  Cory Mathis  has presented today for surgery, with the diagnosis of AFIB.  The various methods of treatment have been discussed with the patient and family. After consideration of risks, benefits and other options for treatment, the patient has consented to  Procedures: CARDIOVERSION (N/A) as a surgical intervention.  The patient's history has been reviewed, patient examined, no change in status, stable for surgery.  I have reviewed the patient's chart and labs.  Questions were answered to the patient's satisfaction.     [name]

## 2024-05-27 NOTE — Transfer of Care (Signed)
 Immediate Anesthesia Transfer of Care Note  Patient: Cory Mathis  Procedure(s) Performed: CARDIOVERSION  Patient Location: Cath Lab  Anesthesia Type:General  Level of Consciousness: drowsy and responds to stimulation  Airway & Oxygen Therapy: Patient Spontanous Breathing  Post-op Assessment: Report given to RN and Post -op Vital signs reviewed and stable  Post vital signs: Reviewed and stable  Last Vitals:  Vitals Value Taken Time  BP 118/72 (86)   Temp    Pulse 82 05/27/24 09:12  Resp 22 05/27/24 09:12  SpO2 94 % 05/27/24 09:12  Vitals shown include unfiled device data.  Last Pain:  Vitals:   05/27/24 0756  TempSrc: Temporal         Complications: No notable events documented.

## 2024-06-24 ENCOUNTER — Telehealth: Payer: Self-pay | Admitting: Student

## 2024-06-24 NOTE — Telephone Encounter (Signed)
 Left message for patient to call back

## 2024-06-24 NOTE — Telephone Encounter (Signed)
 Pt c/o medication issue:  1. Name of Medication: amoxicillin  (AMOXIL ) 500 MG tablet  2. How are you currently taking this medication (dosage and times per day)? Take 4 tablets (2,000 mg total) by mouth once for 1 dose. ( I HOUR PRIOR TO DENTAL WORK )   3. Are you having a reaction (difficulty breathing--STAT)? No  4. What is your medication issue? Pt is requesting a callback regarding him wanting to know if he should be taking this medication he was prescribed back on Sept again. He stated he never took it then but has a dental routine cleaning appt today at 10am. He stated he had a watchman put in about 5 months ago so that's why he's concerned. Please advise.

## 2024-06-24 NOTE — Telephone Encounter (Signed)
 Spoke with patient. He has dental appt at 10:20 AM today and wanted to know if he need antibiotics prior. His 6 month post LAAO would be 07/12/2024. Explained to patient he would still require dental prophylaxis until that time. He should take his antibiotic 1 hr prior to dental appt. Instructed him to speak with dental office if visit needs to be delayed. He verbalized understanding.

## 2024-07-10 ENCOUNTER — Encounter: Payer: Self-pay | Admitting: Student

## 2024-07-10 ENCOUNTER — Ambulatory Visit: Admitting: Student

## 2024-07-10 VITALS — BP 132/74 | HR 64 | Ht 70.5 in | Wt 241.0 lb

## 2024-07-10 DIAGNOSIS — I4819 Other persistent atrial fibrillation: Secondary | ICD-10-CM | POA: Insufficient documentation

## 2024-07-10 DIAGNOSIS — I4891 Unspecified atrial fibrillation: Secondary | ICD-10-CM | POA: Insufficient documentation

## 2024-07-10 DIAGNOSIS — Z7901 Long term (current) use of anticoagulants: Secondary | ICD-10-CM | POA: Diagnosis present

## 2024-07-10 DIAGNOSIS — D6869 Other thrombophilia: Secondary | ICD-10-CM | POA: Diagnosis present

## 2024-07-10 DIAGNOSIS — Z95818 Presence of other cardiac implants and grafts: Secondary | ICD-10-CM | POA: Insufficient documentation

## 2024-07-10 MED ORDER — ASPIRIN 81 MG PO TBEC: 81.0000 mg | DELAYED_RELEASE_TABLET | Freq: Every day | ORAL | 3 refills | Status: AC

## 2024-07-10 NOTE — Progress Notes (Signed)
" °  Electrophysiology Office Note:   Date:  07/10/2024  ID:  Cory Mathis, DOB 09/07/1950, MRN [id number]  Primary Cardiologist: Vina Gull, MD Electrophysiologist: Donnice DELENA Primus, MD      History of Present Illness:   Cory Mathis is a 73 y.o. male with h/o h/o persistent afib, bradycardia, HTN, OSA, treated with cpap seen today for routine electrophysiology follow-up s/p Watchman implantation.  Was initially stable s/p Watchman, but went out of rhythm 12/9. Underwent Cardioversion and has maintained NSR since. Would be willing to consider ablation if has recurrence.   Since last being seen in our clinic the patient reports doing overall well. Currently, he denies chest pain, palpitations, dyspnea, PND, orthopnea, nausea, vomiting, dizziness, syncope, edema, weight gain, or early satiety.    Review of systems complete and found to be negative unless listed in HPI.   EP Information / Studies Reviewed:    EKG is ordered today. Personal review as below.  EKG Interpretation Date/Time:  Thursday July 10 2024 11:43:16 EST Ventricular Rate:  64 PR Interval:  182 QRS Duration:  132 QT Interval:  426 QTC Calculation: 439 R Axis:   -81  Text Interpretation: Normal sinus rhythm Left axis deviation Right bundle branch block Electrode noise Confirmed by Lesia Heck ([id number]) on 07/10/2024 11:50:43 AM    Arrhythmia/Device History No specialty comments available.   Physical Exam:   VS:  BP 132/74   Pulse 64   Ht 5' 10.5 (1.791 m)   Wt 241 lb (109.3 kg)   SpO2 96%   BMI 34.09 kg/m    Wt Readings from Last 3 Encounters:  07/10/24 241 lb (109.3 kg)  05/23/24 239 lb 9.6 oz (108.7 kg)  03/10/24 238 lb (108 kg)     GEN: No acute distress NECK: No JVD; No carotid bruits CARDIAC: Regular rate and rhythm, no murmurs, rubs, gallops RESPIRATORY:  Clear to auscultation without rales, wheezing or rhonchi  ABDOMEN: Soft, non-tender, non-distended EXTREMITIES:  No edema; No deformity    ASSESSMENT AND PLAN:    Paroxysmal Atrial fibrillation Secondary hypercoagulable state S/p Watchman 12/2023 CT 03/2024 showed no device leak or thrombus.  On Feb 1, stop plavix  and start ASA 81 mg daily. Now 6 months out, no longer requires prophylactic antibiotics for dental procedures after 07/12/24. If he has recurrence, he would be very interested in ablation.  CAD No s/s of ischemia.      OSA  Encouraged nightly CPAP   HTN Stable on current regimen   Follow up with Dr. Primus in 6 months to establish.  Signed, Ozell Prentice Lesia, PA-C  "

## 2024-07-10 NOTE — Patient Instructions (Addendum)
 Medication Instructions:  STOP plavix  after 1/31 START ASA 81 mg daily on 2/1 *If you need a refill on your cardiac medications before your next appointment, please call your pharmacy*  Lab Work: No labwork ordered today. If you have labs (blood work) drawn today and your tests are completely normal, you will receive your results only by: MyChart Message (if you have MyChart) OR A paper copy in the mail If you have any lab test that is abnormal or we need to change your treatment, we will call you to review the results.  Testing/Procedures: No testing ordered today  Follow-Up: At [facility], you and your health needs are our priority.  As part of our continuing mission to provide you with exceptional heart care, our providers are all part of one team.  This team includes your primary Cardiologist (physician) and Advanced Practice Providers or APPs (Physician Assistants and Nurse Practitioners) who all work together to provide you with the care you need, when you need it.  Your next appointment:   6 month(s)  Provider:   Donnice DELENA Primus, MD   We recommend signing up for the patient portal called MyChart.  Sign up information is provided on this After Visit Summary.  MyChart is used to connect with patients for Virtual Visits (Telemedicine).  Patients are able to view lab/test results, encounter notes, upcoming appointments, etc.  Non-urgent messages can be sent to your provider as well.   To learn more about what you can do with MyChart, go to [email or web address].
